# Patient Record
Sex: Female | Born: 1965 | Race: White | Hispanic: No | Smoking: Current every day smoker
Health system: Southern US, Community
[De-identification: ages and names within clinical notes are randomized; demographics above are authoritative.]

## PROBLEM LIST (undated history)

## (undated) DIAGNOSIS — J439 Emphysema, unspecified: Secondary | ICD-10-CM

## (undated) DIAGNOSIS — N739 Female pelvic inflammatory disease, unspecified: Secondary | ICD-10-CM

## (undated) DIAGNOSIS — J449 Chronic obstructive pulmonary disease, unspecified: Secondary | ICD-10-CM

## (undated) DIAGNOSIS — K219 Gastro-esophageal reflux disease without esophagitis: Secondary | ICD-10-CM

## (undated) DIAGNOSIS — G709 Myoneural disorder, unspecified: Secondary | ICD-10-CM

## (undated) DIAGNOSIS — A4151 Sepsis due to Escherichia coli [E. coli]: Secondary | ICD-10-CM

## (undated) DIAGNOSIS — C801 Malignant (primary) neoplasm, unspecified: Secondary | ICD-10-CM

## (undated) DIAGNOSIS — IMO0002 Reserved for concepts with insufficient information to code with codable children: Secondary | ICD-10-CM

## (undated) DIAGNOSIS — D649 Anemia, unspecified: Secondary | ICD-10-CM

## (undated) DIAGNOSIS — G35 Multiple sclerosis: Secondary | ICD-10-CM

## (undated) DIAGNOSIS — I10 Essential (primary) hypertension: Secondary | ICD-10-CM

## (undated) DIAGNOSIS — B999 Unspecified infectious disease: Secondary | ICD-10-CM

## (undated) DIAGNOSIS — K5720 Diverticulitis of large intestine with perforation and abscess without bleeding: Secondary | ICD-10-CM

## (undated) DIAGNOSIS — T7840XA Allergy, unspecified, initial encounter: Secondary | ICD-10-CM

## (undated) DIAGNOSIS — T148XXA Other injury of unspecified body region, initial encounter: Secondary | ICD-10-CM

## (undated) DIAGNOSIS — R87619 Unspecified abnormal cytological findings in specimens from cervix uteri: Secondary | ICD-10-CM

## (undated) DIAGNOSIS — R51 Headache: Secondary | ICD-10-CM

## (undated) HISTORY — PX: TUBAL LIGATION: SHX77

## (undated) HISTORY — DX: Female pelvic inflammatory disease, unspecified: N73.9

## (undated) HISTORY — DX: Essential (primary) hypertension: I10

## (undated) HISTORY — DX: Emphysema, unspecified: J43.9

## (undated) HISTORY — PX: ARM WOUND REPAIR / CLOSURE: SUR1141

## (undated) HISTORY — PX: COLPOSCOPY: SHX161

## (undated) HISTORY — PX: OTHER SURGICAL HISTORY: SHX169

## (undated) HISTORY — DX: Malignant (primary) neoplasm, unspecified: C80.1

## (undated) HISTORY — DX: Allergy, unspecified, initial encounter: T78.40XA

## (undated) HISTORY — PX: DILATION AND CURETTAGE OF UTERUS: SHX78

---
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
## 1985-07-18 DIAGNOSIS — C801 Malignant (primary) neoplasm, unspecified: Secondary | ICD-10-CM

## 1985-07-18 HISTORY — DX: Malignant (primary) neoplasm, unspecified: C80.1

## 1999-08-02 ENCOUNTER — Encounter: Payer: Self-pay | Admitting: Emergency Medicine

## 1999-08-02 ENCOUNTER — Emergency Department (HOSPITAL_COMMUNITY): Admission: EM | Admit: 1999-08-02 | Discharge: 1999-08-02 | Payer: Self-pay | Admitting: Emergency Medicine

## 2001-10-10 ENCOUNTER — Emergency Department (HOSPITAL_COMMUNITY): Admission: EM | Admit: 2001-10-10 | Discharge: 2001-10-10 | Payer: Self-pay

## 2001-10-25 ENCOUNTER — Emergency Department (HOSPITAL_COMMUNITY): Admission: EM | Admit: 2001-10-25 | Discharge: 2001-10-25 | Payer: Self-pay | Admitting: Emergency Medicine

## 2002-04-08 ENCOUNTER — Emergency Department (HOSPITAL_COMMUNITY): Admission: EM | Admit: 2002-04-08 | Discharge: 2002-04-09 | Payer: Self-pay

## 2002-04-08 ENCOUNTER — Encounter: Payer: Self-pay | Admitting: Emergency Medicine

## 2003-04-10 ENCOUNTER — Emergency Department (HOSPITAL_COMMUNITY): Admission: EM | Admit: 2003-04-10 | Discharge: 2003-04-10 | Payer: Self-pay | Admitting: Emergency Medicine

## 2003-04-10 ENCOUNTER — Encounter: Payer: Self-pay | Admitting: Emergency Medicine

## 2003-04-11 ENCOUNTER — Ambulatory Visit (HOSPITAL_COMMUNITY): Admission: RE | Admit: 2003-04-11 | Discharge: 2003-04-11 | Payer: Self-pay | Admitting: Emergency Medicine

## 2003-04-11 ENCOUNTER — Encounter: Payer: Self-pay | Admitting: Emergency Medicine

## 2003-10-18 ENCOUNTER — Emergency Department (HOSPITAL_COMMUNITY): Admission: AD | Admit: 2003-10-18 | Discharge: 2003-10-18 | Payer: Self-pay | Admitting: Emergency Medicine

## 2003-10-23 ENCOUNTER — Emergency Department (HOSPITAL_COMMUNITY): Admission: EM | Admit: 2003-10-23 | Discharge: 2003-10-23 | Payer: Self-pay | Admitting: Emergency Medicine

## 2004-05-31 ENCOUNTER — Emergency Department (HOSPITAL_COMMUNITY): Admission: EM | Admit: 2004-05-31 | Discharge: 2004-05-31 | Payer: Self-pay | Admitting: Emergency Medicine

## 2004-06-29 ENCOUNTER — Emergency Department (HOSPITAL_COMMUNITY): Admission: EM | Admit: 2004-06-29 | Discharge: 2004-06-29 | Payer: Self-pay | Admitting: Emergency Medicine

## 2004-09-05 ENCOUNTER — Emergency Department (HOSPITAL_COMMUNITY): Admission: EM | Admit: 2004-09-05 | Discharge: 2004-09-05 | Payer: Self-pay | Admitting: Family Medicine

## 2005-01-05 ENCOUNTER — Emergency Department (HOSPITAL_COMMUNITY): Admission: EM | Admit: 2005-01-05 | Discharge: 2005-01-05 | Payer: Self-pay | Admitting: Emergency Medicine

## 2005-02-02 ENCOUNTER — Emergency Department (HOSPITAL_COMMUNITY): Admission: EM | Admit: 2005-02-02 | Discharge: 2005-02-02 | Payer: Self-pay | Admitting: Emergency Medicine

## 2005-03-01 ENCOUNTER — Emergency Department (HOSPITAL_COMMUNITY): Admission: EM | Admit: 2005-03-01 | Discharge: 2005-03-01 | Payer: Self-pay | Admitting: Emergency Medicine

## 2005-03-04 ENCOUNTER — Emergency Department (HOSPITAL_COMMUNITY): Admission: EM | Admit: 2005-03-04 | Discharge: 2005-03-04 | Payer: Self-pay | Admitting: Family Medicine

## 2005-03-18 ENCOUNTER — Encounter: Admission: RE | Admit: 2005-03-18 | Discharge: 2005-03-18 | Payer: Self-pay | Admitting: Neurology

## 2005-04-13 ENCOUNTER — Encounter: Admission: RE | Admit: 2005-04-13 | Discharge: 2005-04-13 | Payer: Self-pay | Admitting: Neurology

## 2005-10-01 ENCOUNTER — Emergency Department (HOSPITAL_COMMUNITY): Admission: EM | Admit: 2005-10-01 | Discharge: 2005-10-01 | Payer: Self-pay | Admitting: Emergency Medicine

## 2005-10-11 ENCOUNTER — Ambulatory Visit: Payer: Self-pay | Admitting: Gastroenterology

## 2005-12-03 ENCOUNTER — Emergency Department (HOSPITAL_COMMUNITY): Admission: EM | Admit: 2005-12-03 | Discharge: 2005-12-03 | Payer: Self-pay | Admitting: Family Medicine

## 2006-01-09 ENCOUNTER — Emergency Department (HOSPITAL_COMMUNITY): Admission: EM | Admit: 2006-01-09 | Discharge: 2006-01-09 | Payer: Self-pay | Admitting: Emergency Medicine

## 2006-08-22 ENCOUNTER — Emergency Department (HOSPITAL_COMMUNITY): Admission: EM | Admit: 2006-08-22 | Discharge: 2006-08-22 | Payer: Self-pay | Admitting: Family Medicine

## 2006-09-25 ENCOUNTER — Emergency Department (HOSPITAL_COMMUNITY): Admission: EM | Admit: 2006-09-25 | Discharge: 2006-09-25 | Payer: Self-pay | Admitting: Emergency Medicine

## 2007-06-18 ENCOUNTER — Emergency Department (HOSPITAL_COMMUNITY): Admission: EM | Admit: 2007-06-18 | Discharge: 2007-06-18 | Payer: Self-pay | Admitting: Emergency Medicine

## 2007-09-25 ENCOUNTER — Emergency Department (HOSPITAL_COMMUNITY): Admission: EM | Admit: 2007-09-25 | Discharge: 2007-09-25 | Payer: Self-pay | Admitting: Family Medicine

## 2007-12-24 ENCOUNTER — Emergency Department (HOSPITAL_COMMUNITY): Admission: EM | Admit: 2007-12-24 | Discharge: 2007-12-24 | Payer: Self-pay | Admitting: Emergency Medicine

## 2008-02-10 ENCOUNTER — Emergency Department (HOSPITAL_COMMUNITY): Admission: EM | Admit: 2008-02-10 | Discharge: 2008-02-10 | Payer: Self-pay | Admitting: Emergency Medicine

## 2008-03-20 ENCOUNTER — Emergency Department (HOSPITAL_COMMUNITY): Admission: EM | Admit: 2008-03-20 | Discharge: 2008-03-21 | Payer: Self-pay | Admitting: Emergency Medicine

## 2008-04-04 ENCOUNTER — Emergency Department (HOSPITAL_COMMUNITY): Admission: EM | Admit: 2008-04-04 | Discharge: 2008-04-04 | Payer: Self-pay | Admitting: Emergency Medicine

## 2008-06-29 ENCOUNTER — Emergency Department (HOSPITAL_COMMUNITY): Admission: EM | Admit: 2008-06-29 | Discharge: 2008-06-29 | Payer: Self-pay | Admitting: Emergency Medicine

## 2008-10-05 ENCOUNTER — Emergency Department (HOSPITAL_COMMUNITY): Admission: EM | Admit: 2008-10-05 | Discharge: 2008-10-05 | Payer: Self-pay | Admitting: Emergency Medicine

## 2008-11-14 ENCOUNTER — Emergency Department (HOSPITAL_COMMUNITY): Admission: EM | Admit: 2008-11-14 | Discharge: 2008-11-14 | Payer: Self-pay | Admitting: Family Medicine

## 2008-12-15 ENCOUNTER — Emergency Department (HOSPITAL_COMMUNITY): Admission: EM | Admit: 2008-12-15 | Discharge: 2008-12-15 | Payer: Self-pay | Admitting: Family Medicine

## 2009-03-07 ENCOUNTER — Emergency Department (HOSPITAL_COMMUNITY): Admission: EM | Admit: 2009-03-07 | Discharge: 2009-03-07 | Payer: Self-pay | Admitting: Family Medicine

## 2010-03-02 ENCOUNTER — Emergency Department (HOSPITAL_COMMUNITY): Admission: EM | Admit: 2010-03-02 | Discharge: 2010-03-02 | Payer: Self-pay | Admitting: Family Medicine

## 2010-06-09 ENCOUNTER — Emergency Department (HOSPITAL_COMMUNITY)
Admission: EM | Admit: 2010-06-09 | Discharge: 2010-06-09 | Disposition: A | Discharge status: Other Acute Inpatient Hospital | Payer: Self-pay | Source: Home / Self Care | Admitting: Emergency Medicine

## 2010-06-09 ENCOUNTER — Emergency Department (HOSPITAL_COMMUNITY)
Admission: EM | Admit: 2010-06-09 | Discharge: 2010-06-09 | Payer: Self-pay | Source: Home / Self Care | Admitting: Emergency Medicine

## 2010-06-14 ENCOUNTER — Emergency Department (HOSPITAL_COMMUNITY)
Admission: EM | Admit: 2010-06-14 | Discharge: 2010-06-14 | Payer: Self-pay | Source: Home / Self Care | Admitting: Emergency Medicine

## 2010-09-29 LAB — URINE MICROSCOPIC-ADD ON

## 2010-09-29 LAB — URINALYSIS, ROUTINE W REFLEX MICROSCOPIC
Glucose, UA: NEGATIVE mg/dL
Protein, ur: 100 mg/dL — AB
Urobilinogen, UA: 0.2 mg/dL (ref 0.0–1.0)

## 2010-10-01 LAB — POCT URINALYSIS DIPSTICK
Ketones, ur: NEGATIVE mg/dL
Specific Gravity, Urine: >=1.03 (ref 1.005–1.030)
pH: 5.5 (ref 5.0–8.0)

## 2010-10-01 LAB — GC/CHLAMYDIA PROBE AMP, GENITAL: Chlamydia, DNA Probe: NEGATIVE

## 2010-10-01 LAB — POCT I-STAT, CHEM 8
BUN: 13 mg/dL (ref 6–23)
Creatinine, Ser: 0.8 mg/dL (ref 0.4–1.2)
Potassium: 4.3 mEq/L (ref 3.5–5.1)
Sodium: 139 mEq/L (ref 135–145)

## 2010-10-01 LAB — WET PREP, GENITAL
Trich, Wet Prep: NONE SEEN
Yeast Wet Prep HPF POC: NONE SEEN

## 2010-11-25 ENCOUNTER — Inpatient Hospital Stay (HOSPITAL_COMMUNITY)
Admission: AD | Admit: 2010-11-25 | Discharge: 2010-11-25 | Disposition: A | Discharge status: Home / Self Care | Payer: Self-pay | Source: Ambulatory Visit | Attending: Obstetrics and Gynecology | Admitting: Obstetrics and Gynecology

## 2010-11-25 DIAGNOSIS — L02219 Cutaneous abscess of trunk, unspecified: Secondary | ICD-10-CM

## 2010-11-25 DIAGNOSIS — L03319 Cellulitis of trunk, unspecified: Secondary | ICD-10-CM | POA: Insufficient documentation

## 2011-04-14 LAB — GC/CHLAMYDIA PROBE AMP, GENITAL
Chlamydia, DNA Probe: NEGATIVE
GC Probe Amp, Genital: NEGATIVE

## 2011-04-14 LAB — WET PREP, GENITAL: Clue Cells Wet Prep HPF POC: NONE SEEN

## 2011-04-18 LAB — GC/CHLAMYDIA PROBE AMP, GENITAL
Chlamydia, DNA Probe: NEGATIVE
GC Probe Amp, Genital: NEGATIVE

## 2011-04-18 LAB — POCT URINALYSIS DIP (DEVICE)
Glucose, UA: NEGATIVE
Nitrite: NEGATIVE

## 2011-04-18 LAB — WET PREP, GENITAL: Yeast Wet Prep HPF POC: NONE SEEN

## 2011-05-04 ENCOUNTER — Inpatient Hospital Stay (HOSPITAL_COMMUNITY)
Admission: AD | Admit: 2011-05-04 | Discharge: 2011-05-04 | Disposition: A | Discharge status: Home / Self Care | Payer: Self-pay | Source: Ambulatory Visit | Attending: Obstetrics & Gynecology | Admitting: Obstetrics & Gynecology

## 2011-05-04 ENCOUNTER — Encounter (HOSPITAL_COMMUNITY): Payer: Self-pay | Admitting: *Deleted

## 2011-05-04 DIAGNOSIS — N951 Menopausal and female climacteric states: Secondary | ICD-10-CM | POA: Insufficient documentation

## 2011-05-04 HISTORY — DX: Multiple sclerosis: G35

## 2011-05-04 HISTORY — DX: Chronic obstructive pulmonary disease, unspecified: J44.9

## 2011-05-04 LAB — URINALYSIS, ROUTINE W REFLEX MICROSCOPIC
Bilirubin Urine: NEGATIVE
Glucose, UA: NEGATIVE mg/dL
Ketones, ur: NEGATIVE mg/dL
Nitrite: NEGATIVE
pH: 7 (ref 5.0–8.0)

## 2011-05-04 LAB — URINE MICROSCOPIC-ADD ON

## 2011-05-04 NOTE — ED Notes (Signed)
Had pregnancy symptoms for several weeks with weight gain; hx of BTL;  Having discomfort with sexual intercourse;

## 2011-05-04 NOTE — ED Provider Notes (Signed)
History   Pt presents today c/o nausea, pelvic pressure, hot flashes, and urinary frequency that comes and goes. She states for the past year her periods seem to "skip" months. She is concerned that she may be pregnant. She denies fever, vag dc, vag irritation, or any other sx at this time.  Chief Complaint  Patient presents with  . Possible Pregnancy   HPI  OB History    Grav Para Term Preterm Abortions TAB SAB Ect Mult Living   7 2 1 1 5 2 3   2       Past Medical History  Diagnosis Date  . Emphysema   . COPD (chronic obstructive pulmonary disease)   . MS (multiple sclerosis)     Past Surgical History  Procedure Date  . Tubal ligation   . Arm wound repair / closure     No family history on file.  History  Substance Use Topics  . Smoking status: Current Everyday Smoker -- 1.0 packs/day  . Smokeless tobacco: Not on file  . Alcohol Use: No    Allergies: No Known Allergies  No prescriptions prior to admission    Review of Systems  Constitutional: Negative for fever.  Cardiovascular: Negative for chest pain.  Gastrointestinal: Positive for abdominal pain. Negative for nausea, vomiting, diarrhea and constipation.  Genitourinary: Negative for dysuria, urgency, frequency and hematuria.  Neurological: Negative for dizziness and headaches.  Psychiatric/Behavioral: Negative for depression and suicidal ideas.   Physical Exam   Blood pressure 127/96, pulse 84, temperature 98 F (36.7 C), temperature source Oral, resp. rate 18, height 5' 2.5" (1.588 m), weight 139 lb 6.4 oz (63.231 kg), last menstrual period 04/26/2011, SpO2 97.00%.  Physical Exam  Nursing note and vitals reviewed. Constitutional: She is oriented to person, place, and time. She appears well-developed and well-nourished. No distress.  HENT:  Head: Normocephalic and atraumatic.  Eyes: EOM are normal. Pupils are equal, round, and reactive to light.  GI: Soft. She exhibits no distension and no mass. There  is tenderness. There is no rebound and no guarding.  Genitourinary: Uterus normal. Cervix exhibits no motion tenderness. Right adnexum displays no mass, no tenderness and no fullness. Left adnexum displays no mass, no tenderness and no fullness. There is bleeding around the vagina. No vaginal discharge found.  Neurological: She is alert and oriented to person, place, and time.  Skin: Skin is warm and dry. She is not diaphoretic.  Psychiatric: She has a normal mood and affect. Her behavior is normal. Judgment and thought content normal.    MAU Course  Procedures  Results for orders placed during the hospital encounter of 05/04/11 (from the past 24 hour(s))  URINALYSIS, ROUTINE W REFLEX MICROSCOPIC     Status: Abnormal   Collection Time   05/04/11 11:25 AM      Component Value Range   Color, Urine YELLOW  YELLOW    Appearance CLEAR  CLEAR    Specific Gravity, Urine 1.020  1.005 - 1.030    pH 7.0  5.0 - 8.0    Glucose, UA NEGATIVE  NEGATIVE (mg/dL)   Hgb urine dipstick MODERATE (*) NEGATIVE    Bilirubin Urine NEGATIVE  NEGATIVE    Ketones, ur NEGATIVE  NEGATIVE (mg/dL)   Protein, ur NEGATIVE  NEGATIVE (mg/dL)   Urobilinogen, UA 2.0 (*) 0.0 - 1.0 (mg/dL)   Nitrite NEGATIVE  NEGATIVE    Leukocytes, UA NEGATIVE  NEGATIVE   URINE MICROSCOPIC-ADD ON     Status: Abnormal  Collection Time   05/04/11 11:25 AM      Component Value Range   Squamous Epithelial / LPF FEW (*) RARE    WBC, UA 0-2  <3 (WBC/hpf)   RBC / HPF 3-6  <3 (RBC/hpf)  POCT PREGNANCY, URINE     Status: Normal   Collection Time   05/04/11 11:30 AM      Component Value Range   Preg Test, Ur NEGATIVE      Urine sent for culture. Assessment and Plan  Perimenopausal sx: discussed with pt at length. Will have her f/u in the GYN clinic. Discussed diet, activity, risks, and precautions.  Alinda Deem. Rice III, DrHSc, MPAS, PA-C  05/04/2011, 12:05 PM   Eliott Nine, PA 05/04/11 1208

## 2011-05-04 NOTE — Progress Notes (Signed)
Pt states she has been having irregular periods for about 8 months. Has been having symptoms of pregnancy, nausea, cramping, urinary frequency. Pressure in lower abdomen and weight gain.

## 2011-05-04 NOTE — ED Notes (Signed)
Family history of early menopause;

## 2011-05-05 LAB — URINE CULTURE: Culture  Setup Time: 201210171759

## 2011-05-05 NOTE — ED Provider Notes (Signed)
Attestation of Attending Supervision of Advanced Practitioner: Evaluation and management procedures were performed by the PA/NP/CNM/OB Fellow under my supervision/collaboration. Chart reviewed and agree with management and plan.  Jafet Wissing A 05/05/2011 2:51 PM

## 2011-06-02 ENCOUNTER — Encounter: Payer: Self-pay | Admitting: Obstetrics and Gynecology

## 2011-06-02 ENCOUNTER — Ambulatory Visit (INDEPENDENT_AMBULATORY_CARE_PROVIDER_SITE_OTHER): Payer: Self-pay | Admitting: Obstetrics and Gynecology

## 2011-06-02 DIAGNOSIS — N951 Menopausal and female climacteric states: Secondary | ICD-10-CM | POA: Insufficient documentation

## 2011-06-02 DIAGNOSIS — Z124 Encounter for screening for malignant neoplasm of cervix: Secondary | ICD-10-CM

## 2011-06-02 DIAGNOSIS — R350 Frequency of micturition: Secondary | ICD-10-CM

## 2011-06-02 DIAGNOSIS — Z720 Tobacco use: Secondary | ICD-10-CM | POA: Insufficient documentation

## 2011-06-02 DIAGNOSIS — Z23 Encounter for immunization: Secondary | ICD-10-CM

## 2011-06-02 DIAGNOSIS — I10 Essential (primary) hypertension: Secondary | ICD-10-CM

## 2011-06-02 DIAGNOSIS — F17200 Nicotine dependence, unspecified, uncomplicated: Secondary | ICD-10-CM

## 2011-06-02 DIAGNOSIS — R3129 Other microscopic hematuria: Secondary | ICD-10-CM

## 2011-06-02 LAB — POCT URINALYSIS DIP (DEVICE)
Bilirubin Urine: NEGATIVE
Ketones, ur: NEGATIVE mg/dL
Leukocytes, UA: NEGATIVE
Nitrite: NEGATIVE
Protein, ur: NEGATIVE mg/dL

## 2011-06-02 LAB — GLUCOSE, CAPILLARY: Glucose-Capillary: 103 mg/dL — ABNORMAL HIGH (ref 70–99)

## 2011-06-02 MED ORDER — INFLUENZA VIRUS VACC SPLIT PF IM SUSP
0.5000 mL | INTRAMUSCULAR | Status: AC
  Administered 2011-06-02: 0.5 mL via INTRAMUSCULAR

## 2011-06-02 NOTE — Progress Notes (Signed)
Addended by: Armond Hang on: 06/02/2011 03:42 PM   Modules accepted: Orders

## 2011-06-02 NOTE — Progress Notes (Signed)
Subjective:    Patient ID: Christina Miller, female    DOB: 08-16-1965, 45 y.o.   MRN: [id number]  HPI She presents for a followup visit after being seen in paternity admissions unit on 05/04/2011. She was primarily concerned with irregular menses and hot flashes at that time. She is post tubal ligation. She is reporting that her menses have been irregular for 2 years describing bleeding every 2-3 weeks for a year, and then for about the past 9 months periods have been every 2-3 months and occasionally heavy. She does have occasional hot flashes but tolerates that symptom is concerned that she has a family history of diabetes and Endo Shears always been very thirsty and having frequency of urination. She does not diet or exercise and smokes about a pack a day her last Pap she believes was a couple of years ago and she does not remember when she had one before that or if she's had any abnormal menses. She has not had a mammogram and would like to be scheduled for one.   Review of Systems  Constitutional: Positive for diaphoresis and fatigue. Negative for activity change and appetite change.  HENT: Negative for congestion.   Respiratory: Positive for cough. Negative for chest tightness, shortness of breath and wheezing.        Chronic dry cough  Cardiovascular: Negative for chest pain, palpitations and leg swelling.  Gastrointestinal: Positive for abdominal distention.  Genitourinary: Positive for frequency and menstrual problem. Negative for dysuria, urgency, hematuria, vaginal bleeding, vaginal discharge, difficulty urinating, vaginal pain, pelvic pain and dyspareunia.       SUI  Musculoskeletal: Negative for back pain, joint swelling and arthralgias.  Neurological: Positive for weakness and headaches. Negative for dizziness.  Psychiatric/Behavioral: Negative for agitation.   Past Medical History  Diagnosis Date  . Emphysema   . COPD (chronic obstructive pulmonary disease)   . MS (multiple  sclerosis)   . Hypertension    Past Surgical History  Procedure Date  . Tubal ligation   . Arm wound repair / closure    History   Social History  . Marital Status: Legally Separated    Spouse Name: N/A    Number of Children: N/A  . Years of Education: N/A   Occupational History  . Not on file.   Social History Main Topics  . Smoking status: Current Everyday Smoker -- 1.0 packs/day  . Smokeless tobacco: Not on file  . Alcohol Use: No  . Drug Use: No  . Sexually Active:    Other Topics Concern  . Not on file   Social History Narrative  . No narrative on file    No current outpatient prescriptions on file prior to visit.  No Known Allergies       Objective:   Physical Exam  Constitutional: She appears well-developed and well-nourished.  Neck: Neck supple.  Cardiovascular: Normal rate.   Abdominal: Soft. She exhibits no distension and no mass. There is tenderness. There is no rebound and no guarding.  Genitourinary: Uterus normal. No vaginal discharge found.       NEFG; vag pale pink, dec. Ruggae, scant white d/c'cx clean, parous os 2+rectocele and cystocele, fair tone Cx: mobile UT: NSSP Adn: NT, no masses Pap done   CBG 103      Assessment & Plan:  Perimenopausal bleeding: FSH, LH, TSH. Pap Cystocele/rectocele: Kegels Microscpic hematuria: C&S CHTN COPD/current smoker: advised [email or web address] and gave other resources, though not motivated at present MS Refer to  [facility] or [facility] for medical problems HCM: Flu vaccine, strt multivit Ca++ 1200 mg and vit D

## 2011-06-02 NOTE — Patient Instructions (Signed)
Perimenopause Perimenopause is the time when your body begins to move into the menopause (no menstrual period for 12 straight months). It is a natural process. Perimenopause can begin 2 to 8 years before the menopause and usually lasts for one year after the menopause. During this time, your ovaries may or may not produce an egg. The ovaries vary in their production of estrogen and progesterone hormones each month. This can cause irregular menstrual periods, difficulty in getting pregnant, vaginal bleeding between periods and uncomfortable symptoms. CAUSES  Irregular production of the ovarian hormones, estrogen and progesterone, and not ovulating every month.   Other causes include:   Tumor of the pituitary gland in the brain.   Medical disease that affects the ovaries.   Radiation treatment.   Chemotherapy.   Unknown causes.   Heavy smoking and excessive alcohol intake can bring on perimenopause sooner.  SYMPTOMS   Hot flashes.   Night sweats.   Irregular menstrual periods.   Decrease sex drive.   Vaginal dryness.   Headaches.   Mood swings.   Depression.   Memory problems.   Irritability.   Tiredness.   Weight gain.   Trouble getting pregnant.   The beginning of losing bone cells (osteoporosis).   The beginning of hardening of the arteries (atherosclerosis).  DIAGNOSIS  Your caregiver will make a diagnosis by analyzing your age, menstrual history and your symptoms. They will do a physical exam noting any changes in your body, especially your female organs. Female hormone tests may or may not be helpful depending on the amount and when you produce the female hormones. However, other hormone tests may be helpful (ex. thyroid hormone) to rule out other problems. TREATMENT  The decision to treat during the perimenopause should be made by you and your caregiver depending on how the symptoms are affecting you and your life style. There are various treatments available  such as:  Treating individual symptoms with a specific medication for that symptom (ex. tranquilizer for depression).   Herbal medications that can help specific symptoms.   Counseling.   Group therapy.   No treatment.  HOME CARE INSTRUCTIONS   Before seeing your caregiver, make a list of your menstrual periods (when the occur, how heavy they are, how long between periods and how long they last), your symptoms and when they started.   Take the medication as recommended by your caregiver.   Sleep and rest.   Exercise.   Eat a diet that contains calcium (good for your bones) and soy (acts like estrogen hormone).   Do not smoke.   Avoid alcoholic beverages.   Taking vitamin E may help in certain cases.   Take calcium and vitamin D supplements to help prevent bone loss.   Group therapy is sometimes helpful.   Acupuncture may help in some cases.  SEEK MEDICAL CARE IF:   You have any of the above and want to know if it is perimenopause.   You want advice and treatment for any of your symptoms mentioned above.   You need a referral to a specialist (gynecologist, psychiatrist or psychologist).  SEEK IMMEDIATE MEDICAL CARE IF:   You have vaginal bleeding.   Your period lasts longer than 8 days.   You periods are recurring sooner than 21 days.   You have bleeding after intercourse.   You have severe depression.   You have pain when you urinate.   You have severe headaches.   You develop vision problems.  Document  Released: 08/11/2004 Document Revised: 03/16/2011 Document Reviewed: 05/01/2008 [facility] Patient Information 2012 [place], [place].

## 2011-06-02 NOTE — Progress Notes (Signed)
Addended by: Armond Hang on: 06/02/2011 03:56 PM   Modules accepted: Orders

## 2011-06-03 LAB — COMPREHENSIVE METABOLIC PANEL
AST: 19 U/L (ref 0–37)
Albumin: 4.5 g/dL (ref 3.5–5.2)
Alkaline Phosphatase: 51 U/L (ref 39–117)
BUN: 12 mg/dL (ref 6–23)
Calcium: 9.6 mg/dL (ref 8.4–10.5)
Chloride: 104 mEq/L (ref 96–112)
Potassium: 4.5 mEq/L (ref 3.5–5.3)
Sodium: 138 mEq/L (ref 135–145)
Total Protein: 7.2 g/dL (ref 6.0–8.3)

## 2011-06-03 LAB — URINALYSIS, ROUTINE W REFLEX MICROSCOPIC
Glucose, UA: NEGATIVE mg/dL
Ketones, ur: NEGATIVE mg/dL
Specific Gravity, Urine: 1.023 (ref 1.005–1.030)
pH: 5.5 (ref 5.0–8.0)

## 2011-06-03 LAB — URINALYSIS, MICROSCOPIC ONLY
Bacteria, UA: NONE SEEN
Casts: NONE SEEN

## 2011-06-03 LAB — LUTEINIZING HORMONE: LH: 48.9 m[IU]/mL

## 2011-06-03 LAB — CBC
HCT: 43.8 % (ref 36.0–46.0)
Hemoglobin: 14.7 g/dL (ref 12.0–15.0)
MCHC: 33.6 g/dL (ref 30.0–36.0)
RDW: 13.1 % (ref 11.5–15.5)
WBC: 11 10*3/uL — ABNORMAL HIGH (ref 4.0–10.5)

## 2011-06-03 LAB — TSH: TSH: 1.614 u[IU]/mL (ref 0.350–4.500)

## 2011-06-08 ENCOUNTER — Telehealth: Payer: Self-pay

## 2011-06-08 NOTE — Telephone Encounter (Addendum)
06/08/11@1135am   Pt called wanting to know her lab/pap results.   06/08/11@1407   Called pt and informed pt that we results and pap came back normal.  And that she had an appt scheduled for 06/30/11 for results, I informed her that she could keep that appt if wanted to discuss further management.  Pt stated understanding and had no further questions.

## 2011-06-13 ENCOUNTER — Other Ambulatory Visit: Payer: Self-pay | Admitting: Obstetrics and Gynecology

## 2011-06-13 DIAGNOSIS — N6452 Nipple discharge: Secondary | ICD-10-CM

## 2011-06-30 ENCOUNTER — Ambulatory Visit: Payer: Self-pay | Admitting: Family Medicine

## 2012-04-23 ENCOUNTER — Encounter (HOSPITAL_COMMUNITY): Payer: Self-pay | Admitting: *Deleted

## 2012-04-23 ENCOUNTER — Inpatient Hospital Stay (HOSPITAL_COMMUNITY)
Admission: AD | Admit: 2012-04-23 | Discharge: 2012-04-23 | Disposition: A | Discharge status: Home / Self Care | Payer: Self-pay | Source: Ambulatory Visit | Attending: Obstetrics & Gynecology | Admitting: Obstetrics & Gynecology

## 2012-04-23 ENCOUNTER — Inpatient Hospital Stay (HOSPITAL_COMMUNITY): Payer: Self-pay

## 2012-04-23 DIAGNOSIS — N926 Irregular menstruation, unspecified: Secondary | ICD-10-CM | POA: Insufficient documentation

## 2012-04-23 DIAGNOSIS — N838 Other noninflammatory disorders of ovary, fallopian tube and broad ligament: Secondary | ICD-10-CM | POA: Insufficient documentation

## 2012-04-23 DIAGNOSIS — N951 Menopausal and female climacteric states: Secondary | ICD-10-CM

## 2012-04-23 DIAGNOSIS — N939 Abnormal uterine and vaginal bleeding, unspecified: Secondary | ICD-10-CM

## 2012-04-23 HISTORY — DX: Reserved for concepts with insufficient information to code with codable children: IMO0002

## 2012-04-23 HISTORY — DX: Headache: R51

## 2012-04-23 HISTORY — DX: Anemia, unspecified: D64.9

## 2012-04-23 HISTORY — DX: Unspecified abnormal cytological findings in specimens from cervix uteri: R87.619

## 2012-04-23 HISTORY — DX: Other injury of unspecified body region, initial encounter: T14.8XXA

## 2012-04-23 HISTORY — DX: Unspecified infectious disease: B99.9

## 2012-04-23 LAB — WET PREP, GENITAL
Clue Cells Wet Prep HPF POC: NONE SEEN
Trich, Wet Prep: NONE SEEN

## 2012-04-23 LAB — CBC
HCT: 40 % (ref 36.0–46.0)
Hemoglobin: 13.1 g/dL (ref 12.0–15.0)
MCV: 99.3 fL (ref 78.0–100.0)
RBC: 4.03 MIL/uL (ref 3.87–5.11)
WBC: 12.7 10*3/uL — ABNORMAL HIGH (ref 4.0–10.5)

## 2012-04-23 NOTE — MAU Provider Note (Signed)
History     CSN: [id number]  Arrival date and time: 04/23/12 1714   None     Chief Complaint  Patient presents with  . Vaginal Bleeding   HPI STEPHENY CANAL is 46 y.o. [id number] Unknown weeks presenting with report of LMP 3/13 until this past week she had bleeding with clots.  Described as very large clots with heavier than normal bleeding.  Abdominal discomfort.  Was seen here last Oct with irregular period.  Was seen in [place] as followup in Nov.  [facility] at that time was 54.  She has several medical diagnosis and was encouraged to be see at [facility] or Internal medicine clinic but did not follow up with that plan.      Past Medical History  Diagnosis Date  . Emphysema   . COPD (chronic obstructive pulmonary disease)   . MS (multiple sclerosis)   . Hypertension   . Headache   . Anemia   . Infection     urinary tract infection  . Bone fracture     ankle  . Abnormal Pap smear     cryo    Past Surgical History  Procedure Date  . Tubal ligation   . Arm wound repair / closure   . Dilation and curettage of uterus     Family History  Problem Relation Age of Onset  . Other Neg Hx     History  Substance Use Topics  . Smoking status: Current Every Day Smoker -- 1.0 packs/day for 35 years    Types: Cigarettes  . Smokeless tobacco: Not on file  . Alcohol Use: Yes     couple times a wk    Allergies: No Known Allergies  No prescriptions prior to admission    Review of Systems  Constitutional: Negative.        + for hot flashes  Respiratory: Negative.   Cardiovascular:       Blood pressures are elevated  Gastrointestinal: Positive for abdominal pain (cramping).  Genitourinary:       Heavy vaginal bleeding with clots  Psychiatric/Behavioral:       Mood changes associated with irregular cycles   Physical Exam   Blood pressure 147/103, pulse 92, temperature 97.3 F (36.3 C), temperature source Oral, resp. rate 20, last menstrual period  04/19/2012.  Physical Exam  Constitutional: She is oriented to person, place, and time. She appears well-developed and well-nourished. No distress.  HENT:  Head: Normocephalic.  Neck: Normal range of motion.  Cardiovascular: Normal rate.   Respiratory: Effort normal.  GI: Soft. She exhibits no mass. There is no tenderness. There is no rebound and no guarding.  Genitourinary: Uterus is not enlarged and not tender. Right adnexum displays no mass, no tenderness and no fullness. Left adnexum displays no mass, no tenderness and no fullness. There is bleeding (no active bleeding, 1 medium size clot in vaginal) around the vagina.  Neurological: She is alert and oriented to person, place, and time.  Skin: Skin is warm and dry.  Psychiatric: She has a normal mood and affect. Her behavior is normal.   Clinical Data: Heavy uterine bleeding. Last menstrual period in  March 2013.  TRANSABDOMINAL AND TRANSVAGINAL ULTRASOUND OF PELVIS  Technique: Both transabdominal and transvaginal ultrasound  examinations of the pelvis were performed. Transabdominal  technique was performed for global imaging of the pelvis including  uterus, ovaries, adnexal regions, and pelvic cul-de-sac.  It was necessary to proceed with endovaginal exam following the  transabdominal  exam to visualize the uterus and ovaries.  Comparison: None.  Findings:  Uterus: Normal in size and appearance  Endometrium: Normal in thickness and appearance  Right ovary: Normal in appearance within limits of visualization.  Suboptimal characterization due to overlying bowel artifact.  Left ovary: Slight enlargement with a 2.2 x 2.0 x 1.9 cm complex  area containing several follicles. Sonographic follow-up 6 weeks  recommended to ensure resolution. Considerations would include  complex follicular cyst, endometrioma, or neoplasm. This also could  represent a complex corpus luteum cyst as the patient gave history  of left-sided pain 2 weeks  earlier.  Other Findings: No free fluid  IMPRESSION:  Slight enlargement left ovary 4.2 x 2.2 x 2.4 cm with a 2.2 x 2.0 x  1.9 cm complex area. See discussion above. No free fluid or  adnexal mass.  Original Report Authenticated By: Staci Righter, M.D.   \\ Results for orders placed during the hospital encounter of 04/23/12 (from the past 24 hour(s))  CBC     Status: Abnormal   Collection Time   04/23/12  5:21 PM      Component Value Range   WBC 12.7 (*) 4.0 - 10.5 K/uL   RBC 4.03  3.87 - 5.11 MIL/uL   Hemoglobin 13.1  12.0 - 15.0 g/dL   HCT 40.0  36.0 - 46.0 %   MCV 99.3  78.0 - 100.0 fL   MCH 32.5  26.0 - 34.0 pg   MCHC 32.8  30.0 - 36.0 g/dL   RDW 14.0  11.5 - 15.5 %   Platelets 239  150 - 400 K/uL  WET PREP, GENITAL     Status: Abnormal   Collection Time   04/23/12  6:12 PM      Component Value Range   Yeast Wet Prep HPF POC NONE SEEN  NONE SEEN   Trich, Wet Prep NONE SEEN  NONE SEEN   Clue Cells Wet Prep HPF POC NONE SEEN  NONE SEEN   WBC, Wet Prep HPF POC FEW (*) NONE SEEN  POCT PREGNANCY, URINE     Status: Normal   Collection Time   04/23/12  6:48 PM      Component Value Range   Preg Test, Ur NEGATIVE  NEGATIVE    MAU Course  Procedures  MDM GC/CHL culture to lab Discussed with the patient the lab and ultrasound findings.  Reviewed followup care/plan discussed with the patient.   Assessment and Plan  A:  Perimenopausal Menopausal symptoms--irregular menstrual cycle,hot flashes      Left ovarian enlargement  P:  Referred to [place] for further evaluation of perimenopausal sxs and ultrasound findings.        Laaibah Wartman,EVE M 04/23/2012, 6:01 PM

## 2012-04-23 NOTE — Discharge Instructions (Signed)
Abnormal Uterine Bleeding Abnormal uterine bleeding can have many causes. Some cases are simply treated, while others are more serious. There are several kinds of bleeding that is considered abnormal, including:  Bleeding between periods.  Bleeding after sexual intercourse.  Spotting anytime in the menstrual cycle.  Bleeding heavier or more than normal.  Bleeding after menopause. CAUSES  There are many causes of abnormal uterine bleeding. It can be present in teenagers, pregnant women, women during their reproductive years, and women who have reached menopause. Your caregiver will look for the more common causes depending on your age, signs, symptoms and your particular circumstance. Most cases are not serious and can be treated. Even the more serious causes, like cancer of the female organs, can be treated adequately if found in the early stages. That is why all types of bleeding should be evaluated and treated as soon as possible. DIAGNOSIS  Diagnosing the cause may take several kinds of tests. Your caregiver may:  Take a complete history of the type of bleeding.  Perform a complete physical exam and Pap smear.  Take an ultrasound on the abdomen showing a picture of the female organs and the pelvis.  Inject dye into the uterus and Fallopian tubes and X-ray them (hysterosalpingogram).  Place fluid in the uterus and do an ultrasound (sonohysterogrqphy).  Take a CT scan to examine the female organs and pelvis.  Take an MRI to examine the female organs and pelvis. There is no X-ray involved with this procedure.  Look inside the uterus with a telescope that has a light at the end (hysteroscopy).  Scrap the inside of the uterus to get tissue to examine (Dilatation and Curettage, D&C).  Look into the pelvis with a telescope that has a light at the end (laparoscopy). This is done through a very small cut (incision) in the abdomen. TREATMENT  Treatment will depend on the cause of the  abnormal bleeding. It can include:  Doing nothing to allow the problem to take care of itself over time.  Hormone treatment.  Birth control pills.  Treating the medical condition causing the problem.  Laparoscopy.  Major or minor surgery  Destroying the lining of the uterus with electrical currant, laser, freezing or heat (uterine ablation). HOME CARE INSTRUCTIONS   Follow your caregiver's recommendation on how to treat your problem.  See your caregiver if you missed a menstrual period and think you may be pregnant.  If you are bleeding heavily, count the number of pads/tampons you use and how often you have to change them. Tell this to your caregiver.  Avoid sexual intercourse until the problem is controlled. SEEK MEDICAL CARE IF:   You have any kind of abnormal bleeding mentioned above.  You feel dizzy at times.  You are 46 years old and have not had a menstrual period yet. SEEK IMMEDIATE MEDICAL CARE IF:   You pass out.  You are changing pads/tampons every 15 to 30 minutes.  You have belly (abdominal) pain.  You have a temperature of 100 F (37.8 C) or higher.  You become sweaty or weak.  You are passing large blood clots from the vagina.  You start to feel sick to your stomach (nauseous) and throw up (vomit). Document Released: 07/04/2005 Document Revised: 09/26/2011 Document Reviewed: 11/27/2008 [facility] Patient Information 2013 [place]. Perimenopause Perimenopause is the time when your body begins to move into the menopause (no menstrual period for 12 straight months). It is a natural process. Perimenopause can begin 2 to  8 years before the menopause and usually lasts for one year after the menopause. During this time, your ovaries may or may not produce an egg. The ovaries vary in their production of estrogen and progesterone hormones each month. This can cause irregular menstrual periods, difficulty in getting pregnant, vaginal bleeding between  periods and uncomfortable symptoms. CAUSES  Irregular production of the ovarian hormones, estrogen and progesterone, and not ovulating every month.  Other causes include:  Tumor of the pituitary gland in the brain.  Medical disease that affects the ovaries.  Radiation treatment.  Chemotherapy.  Unknown causes.  Heavy smoking and excessive alcohol intake can bring on perimenopause sooner. SYMPTOMS   Hot flashes.  Night sweats.  Irregular menstrual periods.  Decrease sex drive.  Vaginal dryness.  Headaches.  Mood swings.  Depression.  Memory problems.  Irritability.  Tiredness.  Weight gain.  Trouble getting pregnant.  The beginning of losing bone cells (osteoporosis).  The beginning of hardening of the arteries (atherosclerosis). DIAGNOSIS  Your caregiver will make a diagnosis by analyzing your age, menstrual history and your symptoms. They will do a physical exam noting any changes in your body, especially your female organs. Female hormone tests may or may not be helpful depending on the amount and when you produce the female hormones. However, other hormone tests may be helpful (ex. thyroid hormone) to rule out other problems. TREATMENT  The decision to treat during the perimenopause should be made by you and your caregiver depending on how the symptoms are affecting you and your life style. There are various treatments available such as:  Treating individual symptoms with a specific medication for that symptom (ex. tranquilizer for depression).  Herbal medications that can help specific symptoms.  Counseling.  Group therapy.  No treatment. HOME CARE INSTRUCTIONS   Before seeing your caregiver, make a list of your menstrual periods (when the occur, how heavy they are, how long between periods and how long they last), your symptoms and when they started.  Take the medication as recommended by your caregiver.  Sleep and rest.  Exercise.  Eat a  diet that contains calcium (good for your bones) and soy (acts like estrogen hormone).  Do not smoke.  Avoid alcoholic beverages.  Taking vitamin E may help in certain cases.  Take calcium and vitamin D supplements to help prevent bone loss.  Group therapy is sometimes helpful.  Acupuncture may help in some cases. SEEK MEDICAL CARE IF:   You have any of the above and want to know if it is perimenopause.  You want advice and treatment for any of your symptoms mentioned above.  You need a referral to a specialist (gynecologist, psychiatrist or psychologist). SEEK IMMEDIATE MEDICAL CARE IF:   You have vaginal bleeding.  Your period lasts longer than 8 days.  You periods are recurring sooner than 21 days.  You have bleeding after intercourse.  You have severe depression.  You have pain when you urinate.  You have severe headaches.  You develop vision problems. Document Released: 08/11/2004 Document Revised: 09/26/2011 Document Reviewed: 05/01/2008 [facility] Patient Information 2013 [place].

## 2012-04-23 NOTE — MAU Note (Signed)
Had been having dull constant pain in lower abd past 3 wks.

## 2012-04-23 NOTE — MAU Note (Signed)
Heavy bleeding and clotting started on Thurs.  Was changing hourly. Not as heavy today.  Last period was in March.  Never had bleeding like this before.  Has been having dizziness past 3 months.

## 2012-04-24 LAB — GC/CHLAMYDIA PROBE AMP, GENITAL
Chlamydia, DNA Probe: NEGATIVE
GC Probe Amp, Genital: NEGATIVE

## 2012-04-24 NOTE — MAU Provider Note (Signed)
Medical Screening exam and patient care preformed by advanced practice provider.  Agree with the above management.

## 2012-04-29 ENCOUNTER — Encounter (HOSPITAL_COMMUNITY): Payer: Self-pay | Admitting: *Deleted

## 2012-04-29 ENCOUNTER — Inpatient Hospital Stay (HOSPITAL_COMMUNITY)
Admission: AD | Admit: 2012-04-29 | Discharge: 2012-04-29 | Disposition: A | Discharge status: Home / Self Care | Payer: Self-pay | Source: Ambulatory Visit | Attending: Obstetrics and Gynecology | Admitting: Obstetrics and Gynecology

## 2012-04-29 DIAGNOSIS — R109 Unspecified abdominal pain: Secondary | ICD-10-CM | POA: Insufficient documentation

## 2012-04-29 DIAGNOSIS — N938 Other specified abnormal uterine and vaginal bleeding: Secondary | ICD-10-CM | POA: Insufficient documentation

## 2012-04-29 DIAGNOSIS — N949 Unspecified condition associated with female genital organs and menstrual cycle: Secondary | ICD-10-CM

## 2012-04-29 LAB — COMPREHENSIVE METABOLIC PANEL
ALT: 8 U/L (ref 0–35)
AST: 22 U/L (ref 0–37)
Albumin: 3.7 g/dL (ref 3.5–5.2)
Calcium: 8.7 mg/dL (ref 8.4–10.5)
Sodium: 136 mEq/L (ref 135–145)
Total Protein: 6.7 g/dL (ref 6.0–8.3)

## 2012-04-29 LAB — CBC
MCH: 32.9 pg (ref 26.0–34.0)
MCHC: 33.6 g/dL (ref 30.0–36.0)
Platelets: 255 10*3/uL (ref 150–400)

## 2012-04-29 LAB — URINALYSIS, ROUTINE W REFLEX MICROSCOPIC
Bilirubin Urine: NEGATIVE
Nitrite: POSITIVE — AB
Specific Gravity, Urine: >1.03 — ABNORMAL HIGH (ref 1.005–1.030)
pH: 5.5 (ref 5.0–8.0)

## 2012-04-29 LAB — URINE MICROSCOPIC-ADD ON

## 2012-04-29 MED ORDER — MEGESTROL ACETATE 40 MG PO TABS: ORAL_TABLET | ORAL | Status: DC

## 2012-04-29 MED ORDER — SODIUM CHLORIDE 0.9 % IV BOLUS (SEPSIS)
1000.0000 mL | Freq: Once | INTRAVENOUS | Status: AC
  Administered 2012-04-29: 1000 mL via INTRAVENOUS

## 2012-04-29 MED ORDER — MEGESTROL ACETATE 40 MG PO TABS
40.0000 mg | ORAL_TABLET | Freq: Once | ORAL | Status: AC
  Administered 2012-04-29: 40 mg via ORAL
  Filled 2012-04-29: qty 1

## 2012-04-29 NOTE — MAU Note (Signed)
Patient states she was seen in MAU on 10-7 for bleeding. Has an appointment tomorrow but has continued to bleed heavily and have abdominal cramping. Now beginning to feel weak, dizzy, shortness of breath and chest tightening.

## 2012-04-29 NOTE — MAU Provider Note (Signed)
Attestation of Attending Supervision of Advanced Practitioner: Evaluation and management procedures were performed by the PA/NP/CNM/OB Fellow under my supervision/collaboration. Chart reviewed and agree with management and plan.  Bayne Fosnaugh V 04/29/2012 9:12 PM

## 2012-04-29 NOTE — MAU Provider Note (Signed)
History     CSN: [id number]  Arrival date and time: 04/29/12 1513   First Provider Initiated Contact with Patient 04/29/12 1653      Chief Complaint  Patient presents with  . Vaginal Bleeding  . Chest Pain  . Abdominal Cramping   HPI 46 y.o. [id number] with vaginal bleeding. Seen in MAU for DUB on 10/7, has f/u scheduled in clinic tomorrow. Pt reports that for the last week she has been feeling dizzy, lightheaded, weak, short of breath. Bleeding ongoing.    Past Medical History  Diagnosis Date  . Emphysema   . COPD (chronic obstructive pulmonary disease)   . MS (multiple sclerosis)   . Hypertension   . Headache   . Anemia   . Infection     urinary tract infection  . Bone fracture     ankle  . Abnormal Pap smear     cryo    Past Surgical History  Procedure Date  . Tubal ligation   . Arm wound repair / closure   . Dilation and curettage of uterus     Family History  Problem Relation Age of Onset  . Other Neg Hx     History  Substance Use Topics  . Smoking status: Current Every Day Smoker -- 1.0 packs/day for 35 years    Types: Cigarettes  . Smokeless tobacco: Not on file  . Alcohol Use: Yes     couple times a wk    Allergies: No Known Allergies  Prescriptions prior to admission  Medication Sig Dispense Refill  . ibuprofen (ADVIL,MOTRIN) 200 MG tablet Take 200 mg by mouth every 6 (six) hours as needed. For pain        Review of Systems  Constitutional: Positive for malaise/fatigue.  Respiratory: Positive for shortness of breath.   Cardiovascular: Negative.   Gastrointestinal: Negative for nausea, vomiting, abdominal pain, diarrhea and constipation.  Genitourinary: Negative for dysuria, urgency, frequency, hematuria and flank pain.       Negative for vaginal bleeding, vaginal discharge, dyspareunia  Musculoskeletal: Negative.   Neurological: Positive for weakness.  Psychiatric/Behavioral: Negative.    Physical Exam   Blood pressure 151/102, pulse  92, temperature 98.1 F (36.7 C), temperature source Oral, resp. rate 20, height 5' 2"  (1.575 m), weight 128 lb (58.06 kg), last menstrual period 04/14/2012.  Physical Exam  Nursing note and vitals reviewed. Constitutional: She is oriented to person, place, and time. She appears well-developed and well-nourished. No distress.  Cardiovascular: Normal rate.   Respiratory: Effort normal.  GI: Soft. There is no tenderness.  Genitourinary:       Small bleeding on pad   Musculoskeletal: Normal range of motion.  Neurological: She is alert and oriented to person, place, and time.  Skin: Skin is warm and dry.  Psychiatric: She has a normal mood and affect.    MAU Course  Procedures  Previous Hgb 10/7 was 13.1  Results for orders placed during the hospital encounter of 04/29/12 (from the past 24 hour(s))  CBC     Status: Abnormal   Collection Time   04/29/12  3:45 PM      Component Value Range   WBC 11.0 (*) 4.0 - 10.5 K/uL   RBC 3.43 (*) 3.87 - 5.11 MIL/uL   Hemoglobin 11.3 (*) 12.0 - 15.0 g/dL   HCT 33.6 (*) 36.0 - 46.0 %   MCV 98.0  78.0 - 100.0 fL   MCH 32.9  26.0 - 34.0 pg   MCHC 33.6  30.0 - 36.0 g/dL   RDW 13.8  11.5 - 15.5 %   Platelets 255  150 - 400 K/uL  COMPREHENSIVE METABOLIC PANEL     Status: Normal   Collection Time   04/29/12  3:52 PM      Component Value Range   Sodium 136  135 - 145 mEq/L   Potassium 3.5  3.5 - 5.1 mEq/L   Chloride 98  96 - 112 mEq/L   CO2 26  19 - 32 mEq/L   Glucose, Bld 92  70 - 99 mg/dL   BUN 13  6 - 23 mg/dL   Creatinine, Ser 0.76  0.50 - 1.10 mg/dL   Calcium 8.7  8.4 - 10.5 mg/dL   Total Protein 6.7  6.0 - 8.3 g/dL   Albumin 3.7  3.5 - 5.2 g/dL   AST 22  0 - 37 U/L   ALT 8  0 - 35 U/L   Alkaline Phosphatase 50  39 - 117 U/L   Total Bilirubin 0.3  0.3 - 1.2 mg/dL   GFR calc non Af Amer >90  >90 mL/min   GFR calc Af Amer >90  >90 mL/min      . megestrol  40 mg Oral Once  . sodium chloride  1,000 mL Intravenous Once      Assessment and Plan   1. DUB (dysfunctional uterine bleeding)       Medication List     As of 04/29/2012  6:49 PM    START taking these medications         megestrol 40 MG tablet   Commonly known as: MEGACE   1 tab po tid x 3 days, then 1 tab po bid x 3 days, then 1 tab po daily until follow up or instructed to discontinue by provider      CONTINUE taking these medications         ibuprofen 200 MG tablet   Commonly known as: ADVIL,MOTRIN          Where to get your medications    These are the prescriptions that you need to pick up. We sent them to a specific pharmacy, so you will need to go there to get them.   [facility] - [place] - [address]    [address]  ([place]) [place] [address]    Phone: [phone]        megestrol 40 MG tablet            Follow-up Information    Follow up with [facility]. On 04/30/2012. (as scheduled)    Contact information:   [place] 04/29/2012, 5:54 PM

## 2012-04-30 ENCOUNTER — Ambulatory Visit (INDEPENDENT_AMBULATORY_CARE_PROVIDER_SITE_OTHER): Payer: Self-pay | Admitting: Obstetrics & Gynecology

## 2012-04-30 ENCOUNTER — Encounter: Payer: Self-pay | Admitting: Obstetrics & Gynecology

## 2012-04-30 ENCOUNTER — Other Ambulatory Visit (HOSPITAL_COMMUNITY)
Admission: RE | Admit: 2012-04-30 | Discharge: 2012-04-30 | Disposition: A | Discharge status: Home / Self Care | Payer: Self-pay | Source: Ambulatory Visit | Attending: Obstetrics & Gynecology | Admitting: Obstetrics & Gynecology

## 2012-04-30 VITALS — BP 156/100 | HR 86 | Temp 97.4°F | Ht 64.0 in | Wt 129.0 lb

## 2012-04-30 DIAGNOSIS — N951 Menopausal and female climacteric states: Secondary | ICD-10-CM

## 2012-04-30 DIAGNOSIS — Z23 Encounter for immunization: Secondary | ICD-10-CM

## 2012-04-30 DIAGNOSIS — Z1231 Encounter for screening mammogram for malignant neoplasm of breast: Secondary | ICD-10-CM

## 2012-04-30 DIAGNOSIS — N949 Unspecified condition associated with female genital organs and menstrual cycle: Secondary | ICD-10-CM | POA: Insufficient documentation

## 2012-04-30 DIAGNOSIS — N938 Other specified abnormal uterine and vaginal bleeding: Secondary | ICD-10-CM | POA: Insufficient documentation

## 2012-04-30 LAB — POCT PREGNANCY, URINE: Preg Test, Ur: NEGATIVE

## 2012-04-30 MED ORDER — INFLUENZA VIRUS VACC SPLIT PF IM SUSP
0.5000 mL | Freq: Once | INTRAMUSCULAR | Status: AC
  Administered 2012-04-30: 0.5 mL via INTRAMUSCULAR

## 2012-04-30 NOTE — Progress Notes (Signed)
  Subjective:    Patient ID: Christina Miller, female    DOB: [birth year], 46 y.o.   MRN: [id number]  HPI She has had a long h/o DUB. She has been on megace. She was seen in the MAU recently and has a normal u/s.   Review of Systems  She has had a BTL.    Objective:   Physical Exam  Normal exam Cervix prepped with betadine and grasped with a single tooth tenaculum. Pipelle used for 2 passes with a small amount of tissue obtained.      Assessment & Plan:  DUB- await EMBX RTC 2-3 weeks

## 2012-05-10 ENCOUNTER — Telehealth: Payer: Self-pay

## 2012-05-10 NOTE — Telephone Encounter (Signed)
Called pt and informed pt that her biopsy results resulted in no malignancy.  Pt stated understanding.

## 2012-05-10 NOTE — Telephone Encounter (Signed)
Pt called for results of biopsy.

## 2012-05-25 ENCOUNTER — Ambulatory Visit: Payer: Self-pay | Admitting: Obstetrics & Gynecology

## 2012-05-30 ENCOUNTER — Ambulatory Visit (HOSPITAL_COMMUNITY)
Admission: RE | Admit: 2012-05-30 | Discharge: 2012-05-30 | Disposition: A | Discharge status: Home / Self Care | Payer: Self-pay | Source: Ambulatory Visit | Attending: Obstetrics & Gynecology | Admitting: Obstetrics & Gynecology

## 2012-05-30 DIAGNOSIS — Z1231 Encounter for screening mammogram for malignant neoplasm of breast: Secondary | ICD-10-CM

## 2012-06-04 ENCOUNTER — Ambulatory Visit: Payer: Self-pay | Admitting: Obstetrics & Gynecology

## 2012-06-04 ENCOUNTER — Other Ambulatory Visit: Payer: Self-pay | Admitting: Obstetrics & Gynecology

## 2012-06-04 ENCOUNTER — Telehealth: Payer: Self-pay | Admitting: Medical

## 2012-06-04 DIAGNOSIS — R928 Other abnormal and inconclusive findings on diagnostic imaging of breast: Secondary | ICD-10-CM

## 2012-06-04 NOTE — Telephone Encounter (Signed)
Mammogram has not been reviewed by Dr. Hulan Fray yet. I called and informed the patient that we would be in touch after they were reviewed to let her know the results. The patient voiced understanding and did not have any further questions at this time.

## 2012-06-04 NOTE — Telephone Encounter (Signed)
Patient called asking for mammogram results.

## 2012-06-05 NOTE — Telephone Encounter (Signed)
In basket msg sent to Dr. Hulan Fray. Still do not see that results were reviewed by [facility], but patient has diagnostic mammo scheduled. Have asked Dr. Hulan Fray to let me know if patient needs to be informed of results and/or appointments.

## 2012-06-06 NOTE — Telephone Encounter (Signed)
Called pt and left message to return our call to the clinics. Called [facility] and spoke with [place] [facility]ers and [place] informed me that the pt has been spoken to and the pt is the one who picked those appt times scheduled for diagnostics.

## 2012-06-07 ENCOUNTER — Telehealth (HOSPITAL_COMMUNITY): Payer: Self-pay | Admitting: *Deleted

## 2012-06-07 NOTE — Telephone Encounter (Signed)
Telephoned patient at home # and left message to return call to [facility] 

## 2012-06-08 ENCOUNTER — Encounter (HOSPITAL_COMMUNITY): Payer: Self-pay | Admitting: *Deleted

## 2012-06-08 ENCOUNTER — Telehealth: Payer: Self-pay | Admitting: Medical

## 2012-06-08 NOTE — Telephone Encounter (Signed)
Patient called stating that she is returning our call.

## 2012-06-08 NOTE — Telephone Encounter (Signed)
Pt was called yesterday by Rolena Infante LPN from [facility].  E-mail message sent to Gabriel Cirri that pt has returned her call.

## 2012-06-11 ENCOUNTER — Other Ambulatory Visit: Payer: Self-pay

## 2012-06-18 ENCOUNTER — Ambulatory Visit (INDEPENDENT_AMBULATORY_CARE_PROVIDER_SITE_OTHER): Payer: Self-pay | Admitting: Family Medicine

## 2012-06-18 ENCOUNTER — Encounter: Payer: Self-pay | Admitting: Family Medicine

## 2012-06-18 VITALS — BP 154/93 | HR 91 | Temp 97.6°F | Ht 64.0 in | Wt 128.0 lb

## 2012-06-18 DIAGNOSIS — R928 Other abnormal and inconclusive findings on diagnostic imaging of breast: Secondary | ICD-10-CM

## 2012-06-18 DIAGNOSIS — N83202 Unspecified ovarian cyst, left side: Secondary | ICD-10-CM

## 2012-06-18 DIAGNOSIS — N83209 Unspecified ovarian cyst, unspecified side: Secondary | ICD-10-CM

## 2012-06-18 DIAGNOSIS — N949 Unspecified condition associated with female genital organs and menstrual cycle: Secondary | ICD-10-CM

## 2012-06-18 DIAGNOSIS — N938 Other specified abnormal uterine and vaginal bleeding: Secondary | ICD-10-CM

## 2012-06-18 NOTE — Patient Instructions (Signed)
Follow up diagnostic mammogram and breast ultrasound due to abnormal findings (asymmetry) on both breasts on your mammogram.    Breast Cancer, What You Should Know Breast cancer is one of the most common types of cancer in women. It is the second leading cause of death for all women.  The probability that breast cancer will return after treatment is directly related to the stage of its malignancy. This means how advanced the cancer is before it is found and treated. If breast cancer is found and treated early, before the cancer has spread to the lymph nodes, your chance for survival is much better. Delays in diagnosing or treating breast cancer can result in spread of the cancer. This happens when warning signs are ignored and proper measures for diagnosis or treatment are not taken. When breast cancer spreads beyond the breast to other parts of the body, staging is done in order to find out the extent of the cancer and to give the best treatment available. Today, there is a better chance of survival in the treatment of breast cancer. In the past 20 to 30 years, the diagnosis and treatment of breast cancer has greatly improved. There are more options for the type of surgery available (not just radical breast removal [mastectomy] and removal of lymph nodes in the armpit). There are also improved medicines, chemotherapy, radiation therapy, and breast reconstruction. These innovations have improved the survival rate and have helped with complications of breast cancer that were present in the past. Radiation, chemotherapy, and medicines may be used before or after surgery to shrink the tumor, kill any remaining cancer cells, and to prevent spreading and recurrence of the cancer. Research to develop new medicines, procedures, and combinations of treatments is constantly being done to help improve prevention, treatment, and to reduce recurrence of breast cancer. TYPES OF BREAST CANCER  In situ. Cancer is  contained in the tumor and has not spread.  Invasive. Cancer has spread outside the tumor.  Inflammatory. The whole breast is red (inflamed), painful, and swollen (rare).  Paget's disease. Cancer starts in the nipple and spreads to the areola (rare).  Breast cancer in the milk ducts.  Breast cancer in the milk lobules. Males can get breast cancer, but it is very rare. RISK FACTORS There are certain conditions and circumstances that place some women at risk for developing breast cancer. If you have any or several of these risks, you should be aware of them and take extra precautions to take better care of yourself. Some of these risk factors include:  Previous history of breast cancer.  Family history of breast cancer.  Abnormal genes present in your body (BRCA 1, BRCA 2, and HER-2).  Calcium deposits (calcifications) seen on your breast X-ray (mammogram).  Starting your menstrual periods before age 89 (early menarche).  Late menopause, at age 79 or older.  Heavy radiation exposure to the chest.  Cancer of the uterus, ovary, or intestine.  Drinking too much alcohol.  Never having a baby or breastfeeding.  Taking too much hormone treatment for too long.  Being very tall.  Being of Jewish descent.  Obesity.  Presence of estrogen or progesterone receptor cells in the breast.  Smoking. Low-dose birth control pills and fibrocystic disease of the breast are not thought to cause breast cancer. MANAGING BREAST PROBLEMS The following are steps that should be taken to avoid a bad outcome with a breast problem:  You should practice "breast self-awareness." This means understanding the normal appearance  and feel of your breasts and may include breast self-exams. Any changes detected, no matter how small, should be reported to your caregiver. Women in their 35s and 30s should have a clinical breast exam (CBE) by a caregiver as part of a regular health exam every 1 to 3 years.  After age 72, women should have a CBE every year.  All lumps should be evaluated thoroughly by ultrasound, mammogram, magnetic resonance imaging (MRI) scan, tissue sample (biopsy) exam, or removed and examined for cancer.  If you are told you have a breast infection, make sure you follow up with your caregiver until it gets better and goes away.  If you are told a tumor is benign (noncancerous), question the diagnosis and ask for a biopsy to make sure.  All tumors should be removed and examined for cancer.  Do not disregard sharp pains in your breast. Make sure you have an answer for what is causing the pain and follow up properly.  If you have signs of pulling in (retraction) of your nipple, make sure you understand the reason for this and that proper studies are done.  If you have signs of discharge from your nipple, especially blood, make sure you are told the reason for this and that proper tests are done.  If a needle aspiration biopsy is done and is negative, make sure you know when to follow up with this, and when repeat testing should be done. Get a second opinion if necessary.  Do not rely only on mammograms. Be sure to have regular self-exams and physical breast exams by your caregiver.  Have a mammogram done on a regular basis, or as recommended. The [place] has guidelines based on age and risk factors that may be present.  If there are findings from your mammogram, make sure you follow up properly. Make sure you know when additional testing needs to be done. You should know that:  Radiation from mammograms does not cause cancer or other problems to the breast, skin, heart, or lungs.  Other screening tests for the breast are ultrasound and MRI scans.  There are medicines available that may help prevent breast cancer or help prevent recurrence in women at high risk, 58 years old or older. These medicines block estrogen hormone from getting into the  tumor:  Tamoxifen.  Raloxifene.  Trastuzumab.  Sometimes, the ovaries will be removed to decrease estrogen or progesterone hormone production. These hormones may stimulate the growth, recurrence, or spread of breast cancer. Finding out the results of your test When you have breast testing done, ask when your test results will be ready. Make sure you get your test results. FOR MORE INFORMATION American Cancer Society: www.cancer.org Document Released: 10/12/2005 Document Revised: 09/26/2011 Document Reviewed: 05/21/2009 [facility] Patient Information 2013 [place].

## 2012-06-18 NOTE — Progress Notes (Signed)
  Subjective:    Patient ID: Christina Miller, female    DOB: 1965-11-19, 46 y.o.   MRN: [id number]  HPI 1.  DUB: Seen in MAU 10/7 with heavy bleeding/clots after 6 months of amenorrhea (with irregular bleeding prior to that). Pelvic ultrasound done - normal uterus/endometrium, enlarged left ovary with 2 cm "complex" area. Seen MAU 10/13 with continued heavy bleeding/clots, lightheadedness. Hgb 11.3. Started on provera. Seen in [facility] 10/14. EMB done. Benign endometrium.  Took provera for about 2 weeks. No further bleeding except some dark blood on wiping last week.   2.  Abnl screening Mammogram:  11/14 showed bilateral areas of "asymmetry". Diagnostic mammogram and ultrasound scheduled for 12/6. Patient very worried about breast cancer.  3.  High blood pressure: Has BP appt 12/26   Review of Systems  Constitutional: Negative for fever, chills and unexpected weight change.  Eyes: Negative for visual disturbance.  Respiratory: Negative for shortness of breath.   Cardiovascular: Negative for chest pain.  Gastrointestinal: Negative for nausea, vomiting and abdominal pain.  Genitourinary: Negative for dysuria, vaginal discharge and pelvic pain.  Neurological: Negative for dizziness, light-headedness and headaches.       Objective:   Physical Exam  Constitutional: She is oriented to person, place, and time. She appears well-developed and well-nourished. She appears distressed (somewhat anxious).  HENT:  Head: Normocephalic and atraumatic.  Eyes: Conjunctivae normal and EOM are normal.  Neck: Normal range of motion. Neck supple.  Cardiovascular: Normal rate, regular rhythm and normal heart sounds.   Pulmonary/Chest: Effort normal and breath sounds normal. No respiratory distress.  Abdominal: Soft. Bowel sounds are normal. There is no tenderness. There is no rebound and no guarding.  Musculoskeletal: Normal range of motion.  Neurological: She is alert and oriented to person, place, and  time.  Skin: Skin is warm and dry.  Psychiatric:       anxious    Filed Vitals:   06/18/12 1435  BP: 154/93  Pulse: 91  Temp: 97.6 F (36.4 C)       Assessment & Plan:  46 y.o. female with DUB, abnl mammogram, HTN, L ovarian enlargement - EMB benign, likely perimenopausal, pt to call if bleeding recurs - F/U diagnostic mammogram, ultrasound  - Repeat pelvic ultrasound to f/u abnl left ovary - Return in 4 weeks to discuss results of breast findings/ultrasound

## 2012-06-19 ENCOUNTER — Other Ambulatory Visit: Payer: Self-pay

## 2012-06-20 ENCOUNTER — Encounter: Payer: Self-pay | Admitting: Family Medicine

## 2012-06-22 ENCOUNTER — Encounter (HOSPITAL_COMMUNITY): Payer: Self-pay

## 2012-06-22 ENCOUNTER — Ambulatory Visit (HOSPITAL_COMMUNITY)
Admission: RE | Admit: 2012-06-22 | Discharge: 2012-06-22 | Disposition: A | Discharge status: Home / Self Care | Payer: Self-pay | Source: Ambulatory Visit | Attending: Obstetrics and Gynecology | Admitting: Obstetrics and Gynecology

## 2012-06-22 VITALS — BP 136/84 | Temp 98.4°F | Ht 64.0 in | Wt 126.8 lb

## 2012-06-22 DIAGNOSIS — N6324 Unspecified lump in the left breast, lower inner quadrant: Secondary | ICD-10-CM | POA: Insufficient documentation

## 2012-06-22 DIAGNOSIS — Z1239 Encounter for other screening for malignant neoplasm of breast: Secondary | ICD-10-CM

## 2012-06-22 NOTE — Patient Instructions (Addendum)
Taught patient how to perform BSE and gave educational materials to take home. Let patient know will need to schedule a Pap smear due to her history and to schedule when not on menstrual period.  Patient referred to the [facility] for bilateral Diagnostic Mammogram per recommendation and possible bilateral breast ultrasound. Appointment scheduled for Tuesday, June 26, 2012 at 0830. Patient aware of appointment and will be there. Discussed smoking cessation with patient and told her about the free classes offered at the [facility]. Patient has signed up for the next class that starts in January. Patient verbalized understanding.

## 2012-06-22 NOTE — Addendum Note (Signed)
Encounter addended by: Shirley Muscat, RN on: 06/22/2012  5:11 PM<BR>     Documentation filed: Patient Instructions Section

## 2012-06-22 NOTE — Progress Notes (Addendum)
Patient referred to [facility] from the [facility] due to recommending additional imaging of both breasts. Screening mammogram completed 05/30/2012 at [facility].  Pap Smear:    Pap smear not performed today. Patients last Pap smear was 06/02/2011 at the [facility] and normal. Per patient she has had frequent abnormal Pap smears. Per patient had cryo over 5 years ago for an abnormal Pap smear. Pap smear result above is in EPIC.  Physical exam: Breasts Breasts symmetrical. No skin abnormalities bilateral breasts. No nipple retraction bilateral breasts. No nipple discharge bilateral breasts. Per patient has observed some clear/yellow colored discharge when expresses for years. No lymphadenopathy. No lumps palpated right breast. Palpated lump left breast at 7 o'clock 6 cm from the nipple. Patient complained of tenderness when palpated lump. Patient referred to the [facility] for bilateral Diagnostic Mammogram per recommendation and possible bilateral breast ultrasound. Appointment scheduled for Tuesday, June 26, 2012 at 0830.         Pelvic/Bimanual No Pap smear completed today since last Pap smear was 06/02/2011 and normal. Due to patients history would recommend for patient to schedule appointment for Pap smear. Patient on menstrual period therefore was unable to complete.

## 2012-06-26 ENCOUNTER — Other Ambulatory Visit: Payer: Self-pay | Admitting: Obstetrics & Gynecology

## 2012-06-26 ENCOUNTER — Ambulatory Visit
Admission: RE | Admit: 2012-06-26 | Discharge: 2012-06-26 | Disposition: A | Discharge status: Home / Self Care | Payer: No Typology Code available for payment source | Source: Ambulatory Visit | Attending: Obstetrics & Gynecology | Admitting: Obstetrics & Gynecology

## 2012-06-26 DIAGNOSIS — R928 Other abnormal and inconclusive findings on diagnostic imaging of breast: Secondary | ICD-10-CM

## 2012-06-26 DIAGNOSIS — N6320 Unspecified lump in the left breast, unspecified quadrant: Secondary | ICD-10-CM

## 2012-06-28 ENCOUNTER — Telehealth (HOSPITAL_COMMUNITY): Payer: Self-pay | Admitting: *Deleted

## 2012-06-28 NOTE — Telephone Encounter (Signed)
Telephoned patient at home # and scheduled pap smear for Tuesday January 21 8:45. Patient voiced understanding.

## 2012-07-03 ENCOUNTER — Other Ambulatory Visit: Payer: Self-pay | Admitting: Obstetrics & Gynecology

## 2012-07-03 ENCOUNTER — Ambulatory Visit
Admission: RE | Admit: 2012-07-03 | Discharge: 2012-07-03 | Disposition: A | Discharge status: Home / Self Care | Payer: No Typology Code available for payment source | Source: Ambulatory Visit | Attending: Obstetrics & Gynecology | Admitting: Obstetrics & Gynecology

## 2012-07-03 DIAGNOSIS — N6320 Unspecified lump in the left breast, unspecified quadrant: Secondary | ICD-10-CM

## 2012-07-03 HISTORY — PX: BREAST BIOPSY: SHX20

## 2012-07-06 ENCOUNTER — Ambulatory Visit (HOSPITAL_COMMUNITY): Admission: RE | Admit: 2012-07-06 | Payer: Self-pay | Source: Ambulatory Visit

## 2012-07-12 ENCOUNTER — Ambulatory Visit: Payer: Self-pay | Admitting: Family Medicine

## 2012-07-17 ENCOUNTER — Telehealth (HOSPITAL_COMMUNITY): Payer: Self-pay | Admitting: *Deleted

## 2012-07-17 NOTE — Telephone Encounter (Signed)
Telephoned patient at home # and discussed results of diagnostic mammogram and negative biopsy results. Screening mammogram in one year. Patient voiced understanding.

## 2012-07-19 NOTE — Addendum Note (Signed)
Encounter addended by: Shirley Muscat, RN on: 07/19/2012  4:51 PM<BR>     Documentation filed: Charges VN

## 2012-07-20 ENCOUNTER — Ambulatory Visit (HOSPITAL_COMMUNITY)
Admission: RE | Admit: 2012-07-20 | Discharge: 2012-07-20 | Disposition: A | Discharge status: Home / Self Care | Payer: Self-pay | Source: Ambulatory Visit | Attending: Family Medicine | Admitting: Family Medicine

## 2012-07-20 DIAGNOSIS — N83209 Unspecified ovarian cyst, unspecified side: Secondary | ICD-10-CM | POA: Insufficient documentation

## 2012-07-20 DIAGNOSIS — N83202 Unspecified ovarian cyst, left side: Secondary | ICD-10-CM

## 2012-08-07 ENCOUNTER — Ambulatory Visit (HOSPITAL_COMMUNITY): Payer: Self-pay

## 2012-08-08 ENCOUNTER — Emergency Department (HOSPITAL_COMMUNITY)
Admission: EM | Admit: 2012-08-08 | Discharge: 2012-08-08 | Disposition: A | Discharge status: Home / Self Care | Payer: Self-pay | Attending: Emergency Medicine | Admitting: Emergency Medicine

## 2012-08-08 ENCOUNTER — Encounter (HOSPITAL_COMMUNITY): Payer: Self-pay | Admitting: Emergency Medicine

## 2012-08-08 DIAGNOSIS — F1010 Alcohol abuse, uncomplicated: Secondary | ICD-10-CM | POA: Insufficient documentation

## 2012-08-08 DIAGNOSIS — F10929 Alcohol use, unspecified with intoxication, unspecified: Secondary | ICD-10-CM

## 2012-08-08 DIAGNOSIS — Z8709 Personal history of other diseases of the respiratory system: Secondary | ICD-10-CM | POA: Insufficient documentation

## 2012-08-08 DIAGNOSIS — Z862 Personal history of diseases of the blood and blood-forming organs and certain disorders involving the immune mechanism: Secondary | ICD-10-CM | POA: Insufficient documentation

## 2012-08-08 DIAGNOSIS — H6690 Otitis media, unspecified, unspecified ear: Secondary | ICD-10-CM | POA: Insufficient documentation

## 2012-08-08 DIAGNOSIS — J4489 Other specified chronic obstructive pulmonary disease: Secondary | ICD-10-CM | POA: Insufficient documentation

## 2012-08-08 DIAGNOSIS — H9209 Otalgia, unspecified ear: Secondary | ICD-10-CM | POA: Insufficient documentation

## 2012-08-08 DIAGNOSIS — I10 Essential (primary) hypertension: Secondary | ICD-10-CM | POA: Insufficient documentation

## 2012-08-08 DIAGNOSIS — Z87440 Personal history of urinary (tract) infections: Secondary | ICD-10-CM | POA: Insufficient documentation

## 2012-08-08 DIAGNOSIS — J449 Chronic obstructive pulmonary disease, unspecified: Secondary | ICD-10-CM | POA: Insufficient documentation

## 2012-08-08 DIAGNOSIS — Z8669 Personal history of other diseases of the nervous system and sense organs: Secondary | ICD-10-CM | POA: Insufficient documentation

## 2012-08-08 DIAGNOSIS — F17200 Nicotine dependence, unspecified, uncomplicated: Secondary | ICD-10-CM | POA: Insufficient documentation

## 2012-08-08 DIAGNOSIS — Z8781 Personal history of (healed) traumatic fracture: Secondary | ICD-10-CM | POA: Insufficient documentation

## 2012-08-08 DIAGNOSIS — J438 Other emphysema: Secondary | ICD-10-CM | POA: Insufficient documentation

## 2012-08-08 LAB — CBC WITH DIFFERENTIAL/PLATELET
Eosinophils Relative: 6 % — ABNORMAL HIGH (ref 0–5)
HCT: 39.7 % (ref 36.0–46.0)
Lymphocytes Relative: 53 % — ABNORMAL HIGH (ref 12–46)
Lymphs Abs: 4.3 10*3/uL — ABNORMAL HIGH (ref 0.7–4.0)
MCH: 31.8 pg (ref 26.0–34.0)
MCV: 93.4 fL (ref 78.0–100.0)
Monocytes Absolute: 0.4 10*3/uL (ref 0.1–1.0)
Monocytes Relative: 5 % (ref 3–12)
RBC: 4.25 MIL/uL (ref 3.87–5.11)
WBC: 8.1 10*3/uL (ref 4.0–10.5)

## 2012-08-08 LAB — BASIC METABOLIC PANEL
CO2: 23 mEq/L (ref 19–32)
Chloride: 108 mEq/L (ref 96–112)
Glucose, Bld: 86 mg/dL (ref 70–99)
Sodium: 143 mEq/L (ref 135–145)

## 2012-08-08 LAB — TROPONIN I: Troponin I: <0.3 ng/mL (ref <0.30)

## 2012-08-08 MED ORDER — SODIUM CHLORIDE 0.9 % IV BOLUS (SEPSIS)
1000.0000 mL | Freq: Once | INTRAVENOUS | Status: AC
  Administered 2012-08-08: 1000 mL via INTRAVENOUS

## 2012-08-08 MED ORDER — AMOXICILLIN 500 MG PO CAPS: 500.0000 mg | ORAL_CAPSULE | Freq: Three times a day (TID) | ORAL | Status: DC

## 2012-08-08 MED ORDER — VITAMIN B-1 100 MG PO TABS
50.0000 mg | ORAL_TABLET | Freq: Once | ORAL | Status: AC
  Administered 2012-08-08: 50 mg via ORAL
  Filled 2012-08-08: qty 1

## 2012-08-08 MED ORDER — IBUPROFEN 600 MG PO TABS: 600.0000 mg | ORAL_TABLET | Freq: Four times a day (QID) | ORAL | Status: DC | PRN

## 2012-08-08 MED ORDER — KETOROLAC TROMETHAMINE 30 MG/ML IJ SOLN
30.0000 mg | Freq: Once | INTRAMUSCULAR | Status: AC
  Administered 2012-08-08: 30 mg via INTRAVENOUS
  Filled 2012-08-08: qty 1

## 2012-08-08 MED ORDER — HYDROCODONE-ACETAMINOPHEN 5-325 MG PO TABS: 1.0000 | ORAL_TABLET | ORAL | Status: DC | PRN

## 2012-08-08 NOTE — ED Notes (Signed)
MD at bedside. Kathrynn Humble MD

## 2012-08-08 NOTE — ED Notes (Signed)
BIB [facility]. ETOH. Has been drinking today. Daughter called EMS after patient appeared to not be behaving appropriate. Patient states that she only feels nauseous. VSS for transport. CBG 83.

## 2012-08-08 NOTE — ED Notes (Signed)
Patients daughter present. Daughter states that patient had a brief moment of questionable LOC. Unsure.

## 2012-08-08 NOTE — ED Notes (Signed)
Patient states "I guess I passed out or something. I didn't call rescue squad"

## 2012-08-08 NOTE — ED Notes (Signed)
Patients daughter: Pricilla Loveless [phone]

## 2012-08-10 NOTE — ED Provider Notes (Signed)
History     CSN: [id number]  Arrival date & time 08/08/12  1757   First MD Initiated Contact with Patient 08/08/12 1818      Chief Complaint  Patient presents with  . Alcohol Intoxication    (Consider location/radiation/quality/duration/timing/severity/associated sxs/prior treatment) HPI Comments: Pt comes in with cc of alcohol intoxication. Pt is unsure why she was sent to the ER. She states that she has been having some right sided ear pain for the past few days, no drainage from the ear, no hearing loss. She admits to heavy drinking today, no n/v/f/c/chest pain/sob. Doesn't think she passed out or had seizures. Spoke with boy friend, who states that patient was intoxicated, not responding, so EMS was called. No illicit use per patient.   Patient is a 47 y.o. female presenting with intoxication. The history is provided by the patient.  Alcohol Intoxication Pertinent negatives include no chest pain, no abdominal pain, no headaches and no shortness of breath.    Past Medical History  Diagnosis Date  . Emphysema   . COPD (chronic obstructive pulmonary disease)   . MS (multiple sclerosis)   . Hypertension   . Headache   . Anemia   . Infection     urinary tract infection  . Bone fracture     ankle  . Abnormal Pap smear     cryo    Past Surgical History  Procedure Date  . Tubal ligation   . Arm wound repair / closure   . Dilation and curettage of uterus     Family History  Problem Relation Age of Onset  . Other Neg Hx   . Diabetes Father   . Diabetes Sister   . Hypertension Sister   . Diabetes Brother   . Hypertension Brother     History  Substance Use Topics  . Smoking status: Current Every Day Smoker -- 1.0 packs/day for 35 years    Types: Cigarettes  . Smokeless tobacco: Not on file  . Alcohol Use: Yes     Comment: couple times a wk    OB History    Grav Para Term Preterm Abortions TAB SAB Ect Mult Living   7 2 1 1 5 2 3   2       Review of  Systems  Constitutional: Negative for activity change.  HENT: Positive for ear pain. Negative for neck pain.   Respiratory: Negative for shortness of breath.   Cardiovascular: Negative for chest pain.  Gastrointestinal: Negative for nausea, vomiting and abdominal pain.  Genitourinary: Negative for dysuria.  Neurological: Negative for headaches.    Allergies  Review of patient's allergies indicates no known allergies.  Home Medications   Current Outpatient Rx  Name  Route  Sig  Dispense  Refill  . AMOXICILLIN 500 MG PO CAPS   Oral   Take 1 capsule (500 mg total) by mouth 3 (three) times daily.   21 capsule   0   . IBUPROFEN 600 MG PO TABS   Oral   Take 1 tablet (600 mg total) by mouth every 6 (six) hours as needed for pain.   30 tablet   0     BP 109/76  Pulse 87  Temp 97.7 F (36.5 C) (Oral)  Resp 20  SpO2 96%  Physical Exam  Constitutional: She is oriented to person, place, and time. She appears well-developed and well-nourished.  HENT:  Head: Normocephalic and atraumatic.       Right ear  -loss of  landmarks, effusion, erythema. No mastoid tenderness, no exudates appreciated.  Eyes: EOM are normal. Pupils are equal, round, and reactive to light.  Neck: Neck supple.  Cardiovascular: Normal rate, regular rhythm and normal heart sounds.   No murmur heard. Pulmonary/Chest: Effort normal. No respiratory distress.  Abdominal: Soft. She exhibits no distension. There is no tenderness. There is no rebound and no guarding.  Neurological: She is alert and oriented to person, place, and time.  Skin: Skin is warm and dry.    ED Course  Procedures (including critical care time)  Labs Reviewed  BASIC METABOLIC PANEL - Abnormal; Notable for the following:    Calcium 8.3 (*)     All other components within normal limits  CBC WITH DIFFERENTIAL - Abnormal; Notable for the following:    Neutrophils Relative 36 (*)     Lymphocytes Relative 53 (*)     Lymphs Abs 4.3 (*)      Eosinophils Relative 6 (*)     All other components within normal limits  ETHANOL - Abnormal; Notable for the following:    Alcohol, Ethyl (B) 349 (*)     All other components within normal limits  TROPONIN I  LAB REPORT - SCANNED   No results found.   1. Alcohol intoxication   2. Otitis media       MDM  Pt comes in with AMS. She is intoxicated - but alert and oriented x 3 for me. Doesn't appear to be syncope. She has no cardiac dz, no witnessed syncope  - so appears more like she was not responding.  We will get basic labs, hydrate. Tx for AOM will be provided.  LATE ENTRY: About an hour after arrival, family arrived- gave additional hx, that is not indicative of syncope. Will d/c to family.  Varney Biles, MD 08/10/12 [phone]

## 2012-10-28 ENCOUNTER — Encounter (HOSPITAL_COMMUNITY): Payer: Self-pay | Admitting: Family Medicine

## 2012-10-28 ENCOUNTER — Emergency Department (HOSPITAL_COMMUNITY): Payer: Self-pay

## 2012-10-28 ENCOUNTER — Emergency Department (HOSPITAL_COMMUNITY)
Admission: EM | Admit: 2012-10-28 | Discharge: 2012-10-28 | Disposition: A | Discharge status: Home / Self Care | Payer: Self-pay | Attending: Emergency Medicine | Admitting: Emergency Medicine

## 2012-10-28 DIAGNOSIS — G35 Multiple sclerosis: Secondary | ICD-10-CM | POA: Insufficient documentation

## 2012-10-28 DIAGNOSIS — W19XXXA Unspecified fall, initial encounter: Secondary | ICD-10-CM | POA: Insufficient documentation

## 2012-10-28 DIAGNOSIS — Z862 Personal history of diseases of the blood and blood-forming organs and certain disorders involving the immune mechanism: Secondary | ICD-10-CM | POA: Insufficient documentation

## 2012-10-28 DIAGNOSIS — J438 Other emphysema: Secondary | ICD-10-CM | POA: Insufficient documentation

## 2012-10-28 DIAGNOSIS — Y929 Unspecified place or not applicable: Secondary | ICD-10-CM | POA: Insufficient documentation

## 2012-10-28 DIAGNOSIS — S20212A Contusion of left front wall of thorax, initial encounter: Secondary | ICD-10-CM

## 2012-10-28 DIAGNOSIS — Z8742 Personal history of other diseases of the female genital tract: Secondary | ICD-10-CM | POA: Insufficient documentation

## 2012-10-28 DIAGNOSIS — Y9389 Activity, other specified: Secondary | ICD-10-CM | POA: Insufficient documentation

## 2012-10-28 DIAGNOSIS — Z87440 Personal history of urinary (tract) infections: Secondary | ICD-10-CM | POA: Insufficient documentation

## 2012-10-28 DIAGNOSIS — Z8739 Personal history of other diseases of the musculoskeletal system and connective tissue: Secondary | ICD-10-CM | POA: Insufficient documentation

## 2012-10-28 DIAGNOSIS — Z8679 Personal history of other diseases of the circulatory system: Secondary | ICD-10-CM | POA: Insufficient documentation

## 2012-10-28 DIAGNOSIS — J449 Chronic obstructive pulmonary disease, unspecified: Secondary | ICD-10-CM

## 2012-10-28 DIAGNOSIS — I10 Essential (primary) hypertension: Secondary | ICD-10-CM | POA: Insufficient documentation

## 2012-10-28 LAB — CBC WITH DIFFERENTIAL/PLATELET
Eosinophils Absolute: 0 10*3/uL (ref 0.0–0.7)
HCT: 41.4 % (ref 36.0–46.0)
Hemoglobin: 14.9 g/dL (ref 12.0–15.0)
Lymphs Abs: 2.4 10*3/uL (ref 0.7–4.0)
MCH: 33.3 pg (ref 26.0–34.0)
MCV: 92.4 fL (ref 78.0–100.0)
Monocytes Absolute: 1.1 10*3/uL — ABNORMAL HIGH (ref 0.1–1.0)
Monocytes Relative: 9 % (ref 3–12)
Neutrophils Relative %: 73 % (ref 43–77)
RBC: 4.48 MIL/uL (ref 3.87–5.11)

## 2012-10-28 LAB — URINE MICROSCOPIC-ADD ON

## 2012-10-28 LAB — URINALYSIS, ROUTINE W REFLEX MICROSCOPIC
Bilirubin Urine: NEGATIVE
Ketones, ur: 15 mg/dL — AB
Nitrite: NEGATIVE
Urobilinogen, UA: 0.2 mg/dL (ref 0.0–1.0)

## 2012-10-28 LAB — BASIC METABOLIC PANEL
CO2: 19 mEq/L (ref 19–32)
Calcium: 9.2 mg/dL (ref 8.4–10.5)
Glucose, Bld: 106 mg/dL — ABNORMAL HIGH (ref 70–99)
Sodium: 134 mEq/L — ABNORMAL LOW (ref 135–145)

## 2012-10-28 LAB — RAPID URINE DRUG SCREEN, HOSP PERFORMED
Amphetamines: NOT DETECTED
Barbiturates: NOT DETECTED
Benzodiazepines: NOT DETECTED
Cocaine: NOT DETECTED
Tetrahydrocannabinol: NOT DETECTED

## 2012-10-28 MED ORDER — OXYCODONE-ACETAMINOPHEN 5-325 MG PO TABS: 1.0000 | ORAL_TABLET | ORAL | Status: DC | PRN

## 2012-10-28 MED ORDER — IPRATROPIUM BROMIDE 0.02 % IN SOLN
0.5000 mg | Freq: Once | RESPIRATORY_TRACT | Status: AC
  Administered 2012-10-28: 0.5 mg via RESPIRATORY_TRACT
  Filled 2012-10-28: qty 2.5

## 2012-10-28 MED ORDER — DIAZEPAM 5 MG PO TABS: 5.0000 mg | ORAL_TABLET | Freq: Two times a day (BID) | ORAL | Status: DC

## 2012-10-28 MED ORDER — ALBUTEROL SULFATE (5 MG/ML) 0.5% IN NEBU
5.0000 mg | INHALATION_SOLUTION | Freq: Once | RESPIRATORY_TRACT | Status: AC
  Administered 2012-10-28: 5 mg via RESPIRATORY_TRACT
  Filled 2012-10-28: qty 1

## 2012-10-28 MED ORDER — FENTANYL CITRATE 0.05 MG/ML IJ SOLN
100.0000 ug | Freq: Once | INTRAMUSCULAR | Status: AC
  Administered 2012-10-28: 100 ug via INTRAVENOUS
  Filled 2012-10-28: qty 2

## 2012-10-28 MED ORDER — FENTANYL CITRATE 0.05 MG/ML IJ SOLN
50.0000 ug | Freq: Once | INTRAMUSCULAR | Status: AC
  Administered 2012-10-28: 50 ug via INTRAVENOUS
  Filled 2012-10-28: qty 2

## 2012-10-28 MED ORDER — SODIUM CHLORIDE 0.9 % IV BOLUS (SEPSIS)
1000.0000 mL | Freq: Once | INTRAVENOUS | Status: AC
  Administered 2012-10-28: 1000 mL via INTRAVENOUS

## 2012-10-28 NOTE — ED Provider Notes (Signed)
Medical screening examination/treatment/procedure(s) were performed by non-physician practitioner and as supervising physician I was immediately available for consultation/collaboration.  Mervin Kung, MD 10/28/12 [phone]

## 2012-10-28 NOTE — ED Provider Notes (Signed)
History     CSN: [id number]  Arrival date & time 10/28/12  1238   First MD Initiated Contact with Patient 10/28/12 1506      Chief Complaint  Patient presents with  . Fall    (Consider location/radiation/quality/duration/timing/severity/associated sxs/prior treatment) HPI  Patient presents to the ED with complaints of left sided rib/back pain. Significant other in the room says she was going in between two couches when she lost her balance and fell on a harder chair. She was not immediately having pain which is why she did not come to the ED last night. This morning the pain has been so bad she has had difficulty ambulating. She has a positive PMH for Multiple Sclerosis, COPD (smoking), hypertension. She has had a URI for the past week or so and is now wheezing. She denies fevers. Positive SOB. She said that the pain is so bad that she can hardly move. She feels as though her ribs are broken. Pts O2 sats at between 92-95% in room. Pulse is 103. Temp is 98.   Past Medical History  Diagnosis Date  . Emphysema   . COPD (chronic obstructive pulmonary disease)   . MS (multiple sclerosis)   . Hypertension   . Headache   . Anemia   . Infection     urinary tract infection  . Bone fracture     ankle  . Abnormal Pap smear     cryo    Past Surgical History  Procedure Laterality Date  . Tubal ligation    . Arm wound repair / closure    . Dilation and curettage of uterus      Family History  Problem Relation Age of Onset  . Other Neg Hx   . Diabetes Father   . Diabetes Sister   . Hypertension Sister   . Diabetes Brother   . Hypertension Brother     History  Substance Use Topics  . Smoking status: Current Every Day Smoker -- 1.00 packs/day for 35 years    Types: Cigarettes  . Smokeless tobacco: Not on file  . Alcohol Use: Yes     Comment: couple times a wk    OB History   Grav Para Term Preterm Abortions TAB SAB Ect Mult Living   7 2 1 1 5 2 3   2       Review of  Systems  All other systems reviewed and are negative.    Allergies  Review of patient's allergies indicates no known allergies.  Home Medications   Current Outpatient Rx  Name  Route  Sig  Dispense  Refill  . diazepam (VALIUM) 5 MG tablet   Oral   Take 1 tablet (5 mg total) by mouth 2 (two) times daily.   10 tablet   0   . oxyCODONE-acetaminophen (PERCOCET/ROXICET) 5-325 MG per tablet   Oral   Take 1 tablet by mouth every 4 (four) hours as needed for pain.   20 tablet   0     BP 153/98  Pulse 105  Temp(Src) 98 F (36.7 C) (Oral)  Resp 20  SpO2 94%  LMP 06/20/2012  Physical Exam  Nursing note and vitals reviewed. Constitutional: She appears well-developed and well-nourished. No distress.  HENT:  Head: Normocephalic and atraumatic.  Eyes: Pupils are equal, round, and reactive to light.  Neck: Normal range of motion. Neck supple.  Cardiovascular: Normal rate and regular rhythm.   Pulmonary/Chest: Accessory muscle usage present. Tachypnea noted. No apnea. She  has wheezes. She has rhonchi in the right upper field, the right middle field, the right lower field, the left upper field, the left middle field and the left lower field. Chest wall is not dull to percussion. She exhibits tenderness, bony tenderness and swelling. She exhibits no laceration, no crepitus, no edema, no deformity and no retraction.  Abdominal: Soft.  Musculoskeletal:       Lumbar back: She exhibits decreased range of motion, tenderness, bony tenderness, swelling, pain and spasm. She exhibits no edema, no deformity, no laceration and normal pulse.       Back:  Neurological: She is alert.  Skin: Skin is warm and dry.    ED Course  Procedures (including critical care time)  Labs Reviewed  CBC WITH DIFFERENTIAL - Abnormal; Notable for the following:    WBC 13.0 (*)    Neutro Abs 9.5 (*)    Monocytes Absolute 1.1 (*)    All other components within normal limits  PRO B NATRIURETIC PEPTIDE -  Abnormal; Notable for the following:    Pro B Natriuretic peptide (BNP) 527.5 (*)    All other components within normal limits  BASIC METABOLIC PANEL - Abnormal; Notable for the following:    Sodium 134 (*)    Glucose, Bld 106 (*)    All other components within normal limits  URINALYSIS, ROUTINE W REFLEX MICROSCOPIC - Abnormal; Notable for the following:    Color, Urine AMBER (*)    APPearance TURBID (*)    Hgb urine dipstick MODERATE (*)    Ketones, ur 15 (*)    Protein, ur 100 (*)    All other components within normal limits  URINE MICROSCOPIC-ADD ON - Abnormal; Notable for the following:    Squamous Epithelial / LPF FEW (*)    Bacteria, UA FEW (*)    All other components within normal limits  ETHANOL  URINE RAPID DRUG SCREEN (HOSP PERFORMED)  POCT I-STAT TROPONIN I   Dg Ribs Unilateral W/chest Left  10/28/2012  *RADIOLOGY REPORT*  Clinical Data: Fall, left lateral chest pain  LEFT RIBS AND CHEST - 3+ VIEW  Comparison: 06/18/2007.  Findings: Lungs are clear. No pleural effusion or pneumothorax.  Cardiomediastinal silhouette is within normal limits.  No displaced left rib fracture is seen.  IMPRESSION: No evidence of acute cardiopulmonary disease.  No displaced left rib fracture is seen.   Original Report Authenticated By: Julian Hy, M.D.      1. COPD (chronic obstructive pulmonary disease)   2. Rib contusion, left, initial encounter   3. Multiple sclerosis       MDM  3:53pm Patient is having increased effort of breathing with audible crackles. She is unable to move at all without severe pain to her left side.  Pain medication, fluids, cbc, bmp, bnp, trop. Etoh, uds and xrays initiated as well as breathing treatment.  6:52 pm- patients work-up is benign. She has MS which is causing her muscle spasms here in the ED over the area of injnury. She is a family practice patient. She missed her last appointment and needs to call and schedule another one for management of her  chronic issues.  Discussed case with Dr. Rogene Houston.  Pt has been advised of the symptoms that warrant their return to the ED. Patient has voiced understanding and has agreed to follow-up with the PCP or specialist.   Linus Mako, PA-C 10/28/12 1854

## 2012-10-28 NOTE — ED Notes (Signed)
Pt. Reports fell on back last night. C/o left lateral rib pain, tender to palpation. No bruising noted. O2 sat 100% on room air. Coarse crackles heard in upper lungs. Pt. Yelling out in pain.

## 2012-10-28 NOTE — ED Notes (Signed)
Per pt sts tripped and fell last night on a chair. sts couldn't even get out of the bed this am. sts left upper back and rib pain and lower back pain.

## 2012-11-04 ENCOUNTER — Encounter (HOSPITAL_COMMUNITY): Payer: Self-pay | Admitting: Cardiology

## 2012-11-04 ENCOUNTER — Emergency Department (INDEPENDENT_AMBULATORY_CARE_PROVIDER_SITE_OTHER)
Admission: EM | Admit: 2012-11-04 | Discharge: 2012-11-04 | Disposition: A | Discharge status: Home / Self Care | Payer: Self-pay | Source: Home / Self Care | Attending: Emergency Medicine | Admitting: Emergency Medicine

## 2012-11-04 ENCOUNTER — Encounter (HOSPITAL_COMMUNITY): Payer: Self-pay

## 2012-11-04 ENCOUNTER — Emergency Department (HOSPITAL_COMMUNITY)
Admission: EM | Admit: 2012-11-04 | Discharge: 2012-11-04 | Disposition: A | Discharge status: Home / Self Care | Payer: Self-pay | Attending: Emergency Medicine | Admitting: Emergency Medicine

## 2012-11-04 ENCOUNTER — Emergency Department (HOSPITAL_COMMUNITY): Payer: Self-pay

## 2012-11-04 DIAGNOSIS — J449 Chronic obstructive pulmonary disease, unspecified: Secondary | ICD-10-CM | POA: Insufficient documentation

## 2012-11-04 DIAGNOSIS — R109 Unspecified abdominal pain: Secondary | ICD-10-CM

## 2012-11-04 DIAGNOSIS — Z8709 Personal history of other diseases of the respiratory system: Secondary | ICD-10-CM | POA: Insufficient documentation

## 2012-11-04 DIAGNOSIS — W19XXXA Unspecified fall, initial encounter: Secondary | ICD-10-CM

## 2012-11-04 DIAGNOSIS — S3981XA Other specified injuries of abdomen, initial encounter: Secondary | ICD-10-CM | POA: Insufficient documentation

## 2012-11-04 DIAGNOSIS — S2249XA Multiple fractures of ribs, unspecified side, initial encounter for closed fracture: Secondary | ICD-10-CM | POA: Insufficient documentation

## 2012-11-04 DIAGNOSIS — Y929 Unspecified place or not applicable: Secondary | ICD-10-CM | POA: Insufficient documentation

## 2012-11-04 DIAGNOSIS — W1809XA Striking against other object with subsequent fall, initial encounter: Secondary | ICD-10-CM | POA: Insufficient documentation

## 2012-11-04 DIAGNOSIS — Z8669 Personal history of other diseases of the nervous system and sense organs: Secondary | ICD-10-CM | POA: Insufficient documentation

## 2012-11-04 DIAGNOSIS — Z8781 Personal history of (healed) traumatic fracture: Secondary | ICD-10-CM | POA: Insufficient documentation

## 2012-11-04 DIAGNOSIS — Z87440 Personal history of urinary (tract) infections: Secondary | ICD-10-CM | POA: Insufficient documentation

## 2012-11-04 DIAGNOSIS — I10 Essential (primary) hypertension: Secondary | ICD-10-CM | POA: Insufficient documentation

## 2012-11-04 DIAGNOSIS — Z79899 Other long term (current) drug therapy: Secondary | ICD-10-CM | POA: Insufficient documentation

## 2012-11-04 DIAGNOSIS — S2232XA Fracture of one rib, left side, initial encounter for closed fracture: Secondary | ICD-10-CM

## 2012-11-04 DIAGNOSIS — S32009A Unspecified fracture of unspecified lumbar vertebra, initial encounter for closed fracture: Secondary | ICD-10-CM | POA: Insufficient documentation

## 2012-11-04 DIAGNOSIS — R319 Hematuria, unspecified: Secondary | ICD-10-CM

## 2012-11-04 DIAGNOSIS — Z862 Personal history of diseases of the blood and blood-forming organs and certain disorders involving the immune mechanism: Secondary | ICD-10-CM | POA: Insufficient documentation

## 2012-11-04 DIAGNOSIS — F17200 Nicotine dependence, unspecified, uncomplicated: Secondary | ICD-10-CM | POA: Insufficient documentation

## 2012-11-04 DIAGNOSIS — J4489 Other specified chronic obstructive pulmonary disease: Secondary | ICD-10-CM | POA: Insufficient documentation

## 2012-11-04 DIAGNOSIS — Y9389 Activity, other specified: Secondary | ICD-10-CM | POA: Insufficient documentation

## 2012-11-04 LAB — URINALYSIS, ROUTINE W REFLEX MICROSCOPIC
Ketones, ur: NEGATIVE mg/dL
Leukocytes, UA: NEGATIVE
Nitrite: NEGATIVE
Specific Gravity, Urine: 1.029 (ref 1.005–1.030)
Urobilinogen, UA: 1 mg/dL (ref 0.0–1.0)
pH: 6 (ref 5.0–8.0)

## 2012-11-04 LAB — CBC WITH DIFFERENTIAL/PLATELET
Basophils Absolute: 0 10*3/uL (ref 0.0–0.1)
Basophils Relative: 1 % (ref 0–1)
Eosinophils Absolute: 0.3 10*3/uL (ref 0.0–0.7)
Hemoglobin: 14.7 g/dL (ref 12.0–15.0)
MCHC: 34.7 g/dL (ref 30.0–36.0)
Monocytes Relative: 9 % (ref 3–12)
Neutro Abs: 4.7 10*3/uL (ref 1.7–7.7)
Neutrophils Relative %: 61 % (ref 43–77)
Platelets: 201 10*3/uL (ref 150–400)

## 2012-11-04 LAB — POCT URINALYSIS DIP (DEVICE)
Ketones, ur: NEGATIVE mg/dL
Leukocytes, UA: NEGATIVE
Nitrite: NEGATIVE
Protein, ur: 100 mg/dL — AB

## 2012-11-04 LAB — POCT I-STAT, CHEM 8
HCT: 46 % (ref 36.0–46.0)
Hemoglobin: 15.6 g/dL — ABNORMAL HIGH (ref 12.0–15.0)
Potassium: 3.7 mEq/L (ref 3.5–5.1)
Sodium: 142 mEq/L (ref 135–145)
TCO2: 27 mmol/L (ref 0–100)

## 2012-11-04 LAB — CK: Total CK: 87 U/L (ref 7–177)

## 2012-11-04 LAB — URINE MICROSCOPIC-ADD ON

## 2012-11-04 MED ORDER — OXYCODONE-ACETAMINOPHEN 5-325 MG PO TABS
2.0000 | ORAL_TABLET | Freq: Once | ORAL | Status: AC
  Administered 2012-11-04: 2 via ORAL
  Filled 2012-11-04: qty 2

## 2012-11-04 MED ORDER — MORPHINE SULFATE 4 MG/ML IJ SOLN
4.0000 mg | Freq: Once | INTRAMUSCULAR | Status: AC
  Administered 2012-11-04: 4 mg via INTRAVENOUS

## 2012-11-04 MED ORDER — ONDANSETRON HCL 4 MG/2ML IJ SOLN
4.0000 mg | Freq: Once | INTRAMUSCULAR | Status: DC
  Filled 2012-11-04: qty 2

## 2012-11-04 MED ORDER — OXYCODONE-ACETAMINOPHEN 5-325 MG PO TABS: 2.0000 | ORAL_TABLET | Freq: Four times a day (QID) | ORAL | Status: DC | PRN

## 2012-11-04 MED ORDER — IOHEXOL 300 MG/ML  SOLN
50.0000 mL | Freq: Once | INTRAMUSCULAR | Status: AC | PRN
  Administered 2012-11-04: 50 mL via ORAL

## 2012-11-04 MED ORDER — OXYCODONE-ACETAMINOPHEN 5-325 MG PO TABS: 2.0000 | ORAL_TABLET | Freq: Once | ORAL | Status: DC

## 2012-11-04 MED ORDER — MORPHINE SULFATE 4 MG/ML IJ SOLN
4.0000 mg | Freq: Once | INTRAMUSCULAR | Status: DC
  Filled 2012-11-04: qty 1

## 2012-11-04 MED ORDER — ONDANSETRON HCL 4 MG/2ML IJ SOLN
4.0000 mg | Freq: Once | INTRAMUSCULAR | Status: AC
  Administered 2012-11-04: 4 mg via INTRAVENOUS

## 2012-11-04 MED ORDER — IOHEXOL 300 MG/ML  SOLN
100.0000 mL | Freq: Once | INTRAMUSCULAR | Status: AC | PRN
  Administered 2012-11-04: 100 mL via INTRAVENOUS

## 2012-11-04 NOTE — ED Notes (Signed)
Pain in rib area; seen 4-13; concern for undetected rib fx on that visit

## 2012-11-04 NOTE — ED Notes (Addendum)
Reports fell 8 days ago, seen here & D/C'd home. C/o continued pain but states pain now radiating into LLQ. Denies dysuria, has frequent urination x 2 months. LLQ area tender to palpation. States pain worse with mvmt & when laying on that side.

## 2012-11-04 NOTE — ED Notes (Signed)
Pt reports she feel on 4/13 and hurt her ribs. States she recently noticed some blood in her urine. Pt reports pain is mostly on the left side and tender with palpation. No distress noted. UA done at [facility]

## 2012-11-04 NOTE — ED Provider Notes (Signed)
History     CSN: [id number]  Arrival date & time 11/04/12  1400   First MD Initiated Contact with Patient 11/04/12 1526      Chief Complaint  Patient presents with  . Flank Pain  . Hematuria    (Consider location/radiation/quality/duration/timing/severity/associated sxs/prior treatment) Patient is a 47 y.o. female presenting with flank pain. The history is provided by the patient, the spouse and medical records.  Flank Pain This is a new problem. Episode onset: 8 days ago, since fall onto couch. The problem occurs constantly. The problem has been unchanged. Associated symptoms include abdominal pain (LUQ). Pertinent negatives include no chest pain, chills, coughing, fever, neck pain, vomiting or weakness. The symptoms are aggravated by bending, coughing and exertion. She has tried lying down for the symptoms. The treatment provided mild relief.    Past Medical History  Diagnosis Date  . Emphysema   . COPD (chronic obstructive pulmonary disease)   . MS (multiple sclerosis)   . Hypertension   . Headache   . Anemia   . Infection     urinary tract infection  . Bone fracture     ankle  . Abnormal Pap smear     cryo    Past Surgical History  Procedure Laterality Date  . Tubal ligation    . Arm wound repair / closure    . Dilation and curettage of uterus      Family History  Problem Relation Age of Onset  . Other Neg Hx   . Diabetes Father   . Diabetes Sister   . Hypertension Sister   . Diabetes Brother   . Hypertension Brother     History  Substance Use Topics  . Smoking status: Current Every Day Smoker -- 1.00 packs/day for 35 years    Types: Cigarettes  . Smokeless tobacco: Not on file  . Alcohol Use: Yes     Comment: couple times a wk    OB History   Grav Para Term Preterm Abortions TAB SAB Ect Mult Living   7 2 1 1 5 2 3   2       Review of Systems  Constitutional: Negative for fever and chills.  HENT: Negative for neck pain and neck stiffness.    Respiratory: Negative for cough, chest tightness, shortness of breath and wheezing.   Cardiovascular: Negative for chest pain and palpitations.  Gastrointestinal: Positive for abdominal pain (LUQ). Negative for vomiting, diarrhea, constipation and abdominal distention.  Genitourinary: Positive for flank pain (left). Negative for dysuria, frequency, hematuria, decreased urine volume and difficulty urinating.  Skin: Negative for wound.  Neurological: Negative for syncope, weakness and light-headedness.  Hematological: Does not bruise/bleed easily.  Psychiatric/Behavioral: Negative for confusion and agitation.  All other systems reviewed and are negative.    Allergies  Review of patient's allergies indicates no known allergies.  Home Medications   Current Outpatient Rx  Name  Route  Sig  Dispense  Refill  . diazepam (VALIUM) 5 MG tablet   Oral   Take 1 tablet (5 mg total) by mouth 2 (two) times daily.   10 tablet   0   . ibuprofen (ADVIL,MOTRIN) 200 MG tablet   Oral   Take 600 mg by mouth every 6 (six) hours as needed for pain.         Marland Kitchen oxyCODONE-acetaminophen (PERCOCET/ROXICET) 5-325 MG per tablet   Oral   Take 1 tablet by mouth every 4 (four) hours as needed for pain.   20 tablet  0     BP 150/93  Pulse 89  Temp(Src) 97.9 F (36.6 C) (Oral)  Resp 18  SpO2 99%  LMP 06/20/2012  Physical Exam  Nursing note and vitals reviewed. Constitutional: She is oriented to person, place, and time. She appears well-developed and well-nourished.  HENT:  Head: Normocephalic and atraumatic.  Right Ear: External ear normal.  Left Ear: External ear normal.  Nose: Nose normal.  Mouth/Throat: Oropharynx is clear and moist. No oropharyngeal exudate.  Eyes: Conjunctivae are normal.  Neck: Normal range of motion. Neck supple.  Cardiovascular: Normal rate, regular rhythm, normal heart sounds and intact distal pulses.  Exam reveals no gallop and no friction rub.   No murmur  heard. Pulmonary/Chest: Effort normal and breath sounds normal. No respiratory distress. She has no wheezes. She has no rales. She exhibits no tenderness.  Abdominal: Soft. Bowel sounds are normal. She exhibits no distension and no mass. There is no tenderness. There is no rebound and no guarding.  Musculoskeletal: Normal range of motion. She exhibits tenderness (overlying lumbar spine and left costal margin in LUQ and left flank). She exhibits no edema.  Neurological: She is alert and oriented to person, place, and time.  Skin: Skin is warm and dry.  Psychiatric: She has a normal mood and affect. Her behavior is normal. Judgment and thought content normal.    ED Course  Procedures (including critical care time)  Labs Reviewed  URINALYSIS, ROUTINE W REFLEX MICROSCOPIC - Abnormal; Notable for the following:    APPearance CLOUDY (*)    Hgb urine dipstick SMALL (*)    Protein, ur 30 (*)    All other components within normal limits  URINE MICROSCOPIC-ADD ON - Abnormal; Notable for the following:    Squamous Epithelial / LPF FEW (*)    Bacteria, UA FEW (*)    All other components within normal limits  POCT I-STAT, CHEM 8 - Abnormal; Notable for the following:    Glucose, Bld 117 (*)    Hemoglobin 15.6 (*)    All other components within normal limits  CBC WITH DIFFERENTIAL  CK   Ct Abdomen Pelvis W Contrast  11/04/2012  **ADDENDUM** CREATED: 11/04/2012 20:08:14  This patient fell 1 week ago and is having left upper flank pain. Reviewing the bone windows the patient does have acute tenth, eleventh and twelfth rib fractures on the left, posterior laterally.  There also left-sided transverse process fractures of L1, L2, L3 and L4.  No acute injury involving the spleen or left kidney is identified.  No psoas hematoma.  **END ADDENDUM** SIGNED BY: Liliana Cline, M.D.   11/04/2012  *RADIOLOGY REPORT*  Clinical Data: The left upper quadrant abdominal pain.  CT ABDOMEN AND PELVIS WITH CONTRAST   Technique:  Multidetector CT imaging of the abdomen and pelvis was performed following the standard protocol during bolus administration of intravenous contrast.  Contrast: 110m OMNIPAQUE IOHEXOL 300 MG/ML  SOLN  Comparison: None.  Findings: The lung bases are clear.  No pleural effusion.  The liver is unremarkable.  No focal hepatic lesions or intrahepatic biliary dilatation.  The gallbladder is normal.  No common bile duct dilatation.  The pancreas is normal.  The spleen is normal.  The adrenal glands and kidneys are normal.  The stomach, duodenum and small bowel are unremarkable.  No inflammatory changes or mass lesions.  There is an area of mild focal uncomplicated diverticulitis involving the sigmoid colon descending colon junction region.  The remainder the colon is  unremarkable.  The appendix is normal.  The aorta is normal in caliber.  Moderate atherosclerotic calcifications.  The major branch vessels are patent.  No mesenteric or retroperitoneal mass or adenopathy.  Small scattered lymph nodes are noted.  The uterus and ovaries are unremarkable.  There is a small cyst associated the left ovary.  The bladder is normal.  No pelvic mass or adenopathy.  No inguinal mass or hernia.  The bony structures are unremarkable.  IMPRESSION:  Focal area of uncomplicated diverticulitis involving the upper sigmoid colon descending colon junction region.   Original Report Authenticated By: Marijo Sanes, M.D.      1. Left rib fracture, closed, initial encounter   2. Fall, initial encounter   3. Lumbar transverse process fracture, closed, initial encounter       MDM  47 yo F presents for 1 week of left flank and left upper quadrant pain at the left costal margin after a mechanical fall at home. Has had microscopic hematuria at both ED visits. Imaging consistent with left 10th-12th rib fractures and transverse process fractures of L1-L4. No evidence of injuries to spleen or left kidney. Patient with no hypoxia or  dyspnea and no evidence of associated pneumonia. Incentive spirometry and analgesia provided in ED with instructions for use of incentive spirometry. Will prescribe narcotics for pain control and encourage incentive spirometry use to prevent pneumonia. Patient given return precautions, including worsening of signs or symptoms. Patient instructed to follow-up with primary care physician.          Marco Collie, MD 11/04/12 2325

## 2012-11-04 NOTE — ED Notes (Signed)
Patient transported to CT 

## 2012-11-04 NOTE — ED Provider Notes (Signed)
Patient tripped and fell 8 days ago injuring her left ribs she complains of left posterior lateral lower rib pain and left upper quadrant pain since event.  Orlie Dakin, MD 11/04/12 1946

## 2012-11-04 NOTE — ED Provider Notes (Addendum)
History     CSN: [id number]  Arrival date & time 11/04/12  1249   First MD Initiated Contact with Patient 11/04/12 1252      Chief Complaint  Patient presents with  . Rib Injury    (Consider location/radiation/quality/duration/timing/severity/associated sxs/prior treatment) HPI Comments: Patient presents urgent care complaining of ongoing pain to the left flank region ( patient points to both left flank area and left subcostal region), pain is worse when she moves lays down to coughs or takes deep breaths. She hears a " popping sound when she takes a deep breath and suspects that she does have a fracture. Denies any fevers or urinary symptoms, no nausea or vomiting.  Patient is a 47 y.o. female presenting with flank pain. The history is provided by the patient.  Flank Pain This is a recurrent problem. The current episode started more than 1 week ago. The problem occurs constantly. The problem has been gradually worsening. Associated symptoms include abdominal pain. Pertinent negatives include no chest pain, no headaches and no shortness of breath. The symptoms are aggravated by walking, exertion, standing and coughing. The symptoms are relieved by walking and position.    Past Medical History  Diagnosis Date  . Emphysema   . COPD (chronic obstructive pulmonary disease)   . MS (multiple sclerosis)   . Hypertension   . Headache   . Anemia   . Infection     urinary tract infection  . Bone fracture     ankle  . Abnormal Pap smear     cryo    Past Surgical History  Procedure Laterality Date  . Tubal ligation    . Arm wound repair / closure    . Dilation and curettage of uterus      Family History  Problem Relation Age of Onset  . Other Neg Hx   . Diabetes Father   . Diabetes Sister   . Hypertension Sister   . Diabetes Brother   . Hypertension Brother     History  Substance Use Topics  . Smoking status: Current Every Day Smoker -- 1.00 packs/day for 35 years   Types: Cigarettes  . Smokeless tobacco: Not on file  . Alcohol Use: Yes     Comment: couple times a wk    OB History   Grav Para Term Preterm Abortions TAB SAB Ect Mult Living   7 2 1 1 5 2 3   2       Review of Systems  Respiratory: Negative for shortness of breath and stridor.   Cardiovascular: Negative for chest pain and palpitations.  Gastrointestinal: Positive for abdominal pain.  Genitourinary: Positive for hematuria and flank pain. Negative for dysuria and difficulty urinating.  Musculoskeletal: Negative for myalgias, back pain and joint swelling.  Skin: Negative for color change, pallor, rash and wound.  Neurological: Negative for headaches.    Allergies  Review of patient's allergies indicates no known allergies.  Home Medications   Current Outpatient Rx  Name  Route  Sig  Dispense  Refill  . diazepam (VALIUM) 5 MG tablet   Oral   Take 1 tablet (5 mg total) by mouth 2 (two) times daily.   10 tablet   0   . oxyCODONE-acetaminophen (PERCOCET/ROXICET) 5-325 MG per tablet   Oral   Take 1 tablet by mouth every 4 (four) hours as needed for pain.   20 tablet   0     BP 161/98  Pulse 108  Temp(Src) 98.2 F (36.8  C) (Oral)  Resp 20  SpO2 95%  LMP 06/20/2012  Physical Exam  Nursing note and vitals reviewed. Constitutional: Vital signs are normal. She appears well-developed and well-nourished.  Non-toxic appearance. She does not have a sickly appearance. She does not appear ill. No distress.  Pulmonary/Chest: She has no decreased breath sounds.    Abdominal: Soft. She exhibits no distension and no mass. There is tenderness. There is no rebound and no guarding.  Musculoskeletal: She exhibits tenderness.  Neurological: She is alert.  Skin: No rash noted. No erythema.    ED Course  Procedures (including critical care time)  Labs Reviewed  POCT URINALYSIS DIP (DEVICE) - Abnormal; Notable for the following:    Bilirubin Urine SMALL (*)    Hgb urine  dipstick MODERATE (*)    Protein, ur 100 (*)    All other components within normal limits   No results found.   No diagnosis found.  Non-diagnostic- focal handheld ultrasound performed- inconclusive  MDM  Persistent left flank/lateral chest wall pain status post fall > a week ago. Today's exam revealed moderate degree of tenderness on her left upper quadrant region and persistent hematuria. Patient will be transferred to the emergency department to be consider for a focal- USG of left peri-renal space/pouch, and rule out an associated intra-abdominal injury.        Rosana Hoes, MD 11/04/12 [place], MD 11/04/12 1352

## 2012-11-05 ENCOUNTER — Encounter: Payer: Self-pay | Admitting: Emergency Medicine

## 2012-11-05 ENCOUNTER — Encounter: Payer: Self-pay | Admitting: *Deleted

## 2012-11-05 ENCOUNTER — Telehealth (HOSPITAL_COMMUNITY): Payer: Self-pay | Admitting: *Deleted

## 2012-11-05 ENCOUNTER — Telehealth (HOSPITAL_COMMUNITY): Payer: Self-pay | Admitting: Emergency Medicine

## 2012-11-05 NOTE — Telephone Encounter (Signed)
Telephoned patient at home # and left message to return call to [facility]. Patient needs reschedule pap smear.

## 2012-11-05 NOTE — Telephone Encounter (Signed)
Patient returned called. Patient states unable to follow up with pap smear at this time. Has rib fracture and lumbar fracture. Patient states will call back in June for appointment.

## 2012-11-05 NOTE — ED Provider Notes (Signed)
I have personally seen and examined the patient.  I have discussed the plan of care with the resident.  I have reviewed the documentation on PMH/FH/Soc. History.  I have reviewed the documentation of the resident and agree.  Orlie Dakin, MD 11/05/12 0100

## 2012-12-03 ENCOUNTER — Emergency Department (HOSPITAL_COMMUNITY): Payer: Self-pay

## 2012-12-03 ENCOUNTER — Encounter (HOSPITAL_COMMUNITY): Payer: Self-pay | Admitting: Emergency Medicine

## 2012-12-03 ENCOUNTER — Emergency Department (HOSPITAL_COMMUNITY)
Admission: EM | Admit: 2012-12-03 | Discharge: 2012-12-03 | Disposition: A | Discharge status: Home / Self Care | Payer: Self-pay | Attending: Emergency Medicine | Admitting: Emergency Medicine

## 2012-12-03 DIAGNOSIS — J4489 Other specified chronic obstructive pulmonary disease: Secondary | ICD-10-CM | POA: Insufficient documentation

## 2012-12-03 DIAGNOSIS — F17200 Nicotine dependence, unspecified, uncomplicated: Secondary | ICD-10-CM | POA: Insufficient documentation

## 2012-12-03 DIAGNOSIS — G8911 Acute pain due to trauma: Secondary | ICD-10-CM | POA: Insufficient documentation

## 2012-12-03 DIAGNOSIS — S2232XS Fracture of one rib, left side, sequela: Secondary | ICD-10-CM

## 2012-12-03 DIAGNOSIS — J449 Chronic obstructive pulmonary disease, unspecified: Secondary | ICD-10-CM | POA: Insufficient documentation

## 2012-12-03 DIAGNOSIS — Z862 Personal history of diseases of the blood and blood-forming organs and certain disorders involving the immune mechanism: Secondary | ICD-10-CM | POA: Insufficient documentation

## 2012-12-03 DIAGNOSIS — Z87440 Personal history of urinary (tract) infections: Secondary | ICD-10-CM | POA: Insufficient documentation

## 2012-12-03 DIAGNOSIS — M549 Dorsalgia, unspecified: Secondary | ICD-10-CM | POA: Insufficient documentation

## 2012-12-03 DIAGNOSIS — I10 Essential (primary) hypertension: Secondary | ICD-10-CM | POA: Insufficient documentation

## 2012-12-03 DIAGNOSIS — Z8669 Personal history of other diseases of the nervous system and sense organs: Secondary | ICD-10-CM | POA: Insufficient documentation

## 2012-12-03 MED ORDER — HYDROCODONE-ACETAMINOPHEN 5-325 MG PO TABS: 1.0000 | ORAL_TABLET | ORAL | Status: DC | PRN

## 2012-12-03 MED ORDER — IBUPROFEN 800 MG PO TABS
800.0000 mg | ORAL_TABLET | Freq: Once | ORAL | Status: AC
  Administered 2012-12-03: 800 mg via ORAL
  Filled 2012-12-03: qty 1

## 2012-12-03 NOTE — ED Notes (Signed)
Pt sts seen here after fall and told she had broken ribs and fractures in back; pt sts still having some pain in left rib area and around to back and here for follow up; pt denies new injury and sts some improvement but sts still having some pain

## 2012-12-03 NOTE — ED Provider Notes (Signed)
History     CSN: [id number]  Arrival date & time 12/03/12  1006   First MD Initiated Contact with Patient 12/03/12 1034      Chief Complaint  Patient presents with  . Follow-up    (Consider location/radiation/quality/duration/timing/severity/associated sxs/prior treatment) HPI  Pt is a 47 yo F with recent rib fractures one month ago presenting for follow up for continued stiffness over injured ribs. Pt states the pain is much better from one month ago, but was worried because she was having stiffness. Pt states she was told that it would take several weeks for her ribs to heal. Pt has no new complaints or worsening rib pain. Pt denies any CP, SOB, injury to the area, abdominal pain.   Past Medical History  Diagnosis Date  . Emphysema   . COPD (chronic obstructive pulmonary disease)   . MS (multiple sclerosis)   . Hypertension   . Headache   . Anemia   . Infection     urinary tract infection  . Bone fracture     ankle  . Abnormal Pap smear     cryo    Past Surgical History  Procedure Laterality Date  . Tubal ligation    . Arm wound repair / closure    . Dilation and curettage of uterus      Family History  Problem Relation Age of Onset  . Other Neg Hx   . Diabetes Father   . Diabetes Sister   . Hypertension Sister   . Diabetes Brother   . Hypertension Brother     History  Substance Use Topics  . Smoking status: Current Every Day Smoker -- 1.00 packs/day for 35 years    Types: Cigarettes  . Smokeless tobacco: Not on file  . Alcohol Use: Yes     Comment: couple times a wk    OB History   Grav Para Term Preterm Abortions TAB SAB Ect Mult Living   7 2 1 1 5 2 3   2       Review of Systems  Constitutional: Negative for fever and chills.  Respiratory: Negative for cough, chest tightness and shortness of breath.   Cardiovascular: Negative for chest pain and palpitations.  Musculoskeletal: Positive for back pain.  All other systems reviewed and are  negative.    Allergies  Review of patient's allergies indicates no known allergies.  Home Medications   Current Outpatient Rx  Name  Route  Sig  Dispense  Refill  . ibuprofen (ADVIL,MOTRIN) 200 MG tablet   Oral   Take 600 mg by mouth every 6 (six) hours as needed for pain.           BP 147/104  Pulse 99  Temp(Src) 98.1 F (36.7 C) (Oral)  Resp 18  SpO2 95%  LMP 06/20/2012  Physical Exam  Constitutional: She is oriented to person, place, and time. She appears well-developed and well-nourished.  HENT:  Head: Normocephalic and atraumatic.  Eyes: EOM are normal. Pupils are equal, round, and reactive to light.  Cardiovascular: Normal rate, regular rhythm and normal heart sounds.   Pulmonary/Chest: Effort normal and breath sounds normal. No accessory muscle usage. Not tachypneic. No respiratory distress.    No flail chest No asymmetric expansion.  Mildly tender w/ palpation to L 10, 11, and 12th rib region.   Abdominal: Soft. Bowel sounds are normal. There is no tenderness.  Neurological: She is alert and oriented to person, place, and time.  Skin: Skin is warm  and dry.  Psychiatric: She has a normal mood and affect.    ED Course  Procedures (including critical care time)  Labs Reviewed - No data to display Dg Chest 2 View  12/03/2012   *RADIOLOGY REPORT*  Clinical Data: Shortness of breath  CHEST - 2 VIEW  Comparison: 10/28/2012  Findings: Heart size is normal.  No pleural effusion or edema.  There is no airspace consolidation identified.  There are coarsened interstitial markings noted bilaterally.  The visualized osseous structures are unremarkable.  IMPRESSION:  1.  No acute cardiopulmonary abnormalities.   Original Report Authenticated By: Kerby Moors, M.D.     1. Rib fracture, left, sequela       MDM  Pt presenting for sequela visit regarding rib fracture s/p one month ago. Pt has no new changing or worsening pain. Some mild tenderness to palpation over  left lower rib region. No asymmetric chest expansion. No abdominal pain. Imaging shows no fracture or other acute cardiopulmonary etiologies. Directed pt to ice injury, take pain medications, and to  rest the injury when possible. Again advised rib fractures can take up to 5-6wks to heal and feel fully better. Patient agreeable to plan. Patient d/w with Dr. Kathrynn Humble, agrees with plan. Patient is stable at time of discharge.          Harlow Mares, PA-C 12/03/12 1557  Medical screening examination/treatment/procedure(s) were performed by non-physician practitioner and as supervising physician I was immediately available for consultation/collaboration.  Varney Biles, MD 12/05/12 1650

## 2012-12-04 ENCOUNTER — Encounter (HOSPITAL_COMMUNITY): Payer: Self-pay

## 2012-12-04 ENCOUNTER — Ambulatory Visit (HOSPITAL_COMMUNITY)
Admission: RE | Admit: 2012-12-04 | Discharge: 2012-12-04 | Disposition: A | Discharge status: Home / Self Care | Payer: Self-pay | Source: Ambulatory Visit | Attending: Obstetrics and Gynecology | Admitting: Obstetrics and Gynecology

## 2012-12-04 VITALS — BP 142/86 | Temp 97.6°F | Ht 64.0 in | Wt 122.6 lb

## 2012-12-04 DIAGNOSIS — Z01419 Encounter for gynecological examination (general) (routine) without abnormal findings: Secondary | ICD-10-CM

## 2012-12-04 NOTE — Progress Notes (Signed)
Complaints of left ovary pain.  Pap Smear:    Completed Pap smear today. Last Pap smear was 05/23/2011 at the [facility] and normal. Per patient she has had frequent abnormal Pap smears. Per patient had cryo over 5 years ago for an abnormal Pap smear. Pap smear result above is in EPIC.         Pelvic/Bimanual   Ext Genitalia No lesions, no swelling and no discharge observed on external genitalia.         Vagina Vagina pink and normal texture. No lesions or discharge observed in vagina.          Cervix Cervix is present. Cervix pink and slightly red around os. Cervix of normal texture. No discharge observed on cervix.      Uterus Uterus is present and palpable. Uterus in normal position and normal size.       Adnexae Bilateral ovaries present and palpable. No tenderness on palpation right ovary. Left ovary tender and enlarged. Patient is being followed by the [facility]. Told patient to call and schedule a follow up appointment at the Clinics.       Rectovaginal No rectal exam completed today since patient had no rectal complaints. No skin abnormalities observed on rectal area.

## 2012-12-04 NOTE — Patient Instructions (Signed)
Let her know next Pap smear will be due based on today's result. Discussed smoking cessation with patient and gave her resources of free classes offered by the [facility]. Let patient know will follow up with her within the next couple weeks with results by phone. Patient verbalized understanding.

## 2012-12-24 ENCOUNTER — Telehealth (HOSPITAL_COMMUNITY): Payer: Self-pay | Admitting: *Deleted

## 2012-12-24 NOTE — Telephone Encounter (Signed)
Patient returned call. Advised patient of abnormal pap smear and would need colposcopy. Advised patient was scheduled at [facility] Thursday July 2 12:45. Patient voiced understanding.

## 2012-12-24 NOTE — Telephone Encounter (Signed)
Telephoned patient at home # and left message to return call to [facility] due to abnormal pap smear. Patient has appointment at [facility] outpatient clinic for [facility] Thursday July 3 12:45

## 2013-01-17 ENCOUNTER — Encounter: Payer: Self-pay | Admitting: Family Medicine

## 2013-01-24 ENCOUNTER — Telehealth (HOSPITAL_COMMUNITY): Payer: Self-pay | Admitting: *Deleted

## 2013-01-24 NOTE — Telephone Encounter (Signed)
Patient returned call and advised would need to call [facility] and reschedule Colpo. Patient voiced understanding.

## 2013-01-24 NOTE — Telephone Encounter (Signed)
Telephoned patient at home # and left message to return call to [facility] 

## 2013-03-06 ENCOUNTER — Encounter: Payer: Self-pay | Admitting: Obstetrics & Gynecology

## 2013-04-02 ENCOUNTER — Emergency Department (HOSPITAL_COMMUNITY): Payer: Self-pay

## 2013-04-02 ENCOUNTER — Encounter (HOSPITAL_COMMUNITY): Payer: Self-pay | Admitting: Emergency Medicine

## 2013-04-02 ENCOUNTER — Emergency Department (HOSPITAL_COMMUNITY)
Admission: EM | Admit: 2013-04-02 | Discharge: 2013-04-02 | Disposition: A | Discharge status: Home / Self Care | Payer: Self-pay | Attending: Emergency Medicine | Admitting: Emergency Medicine

## 2013-04-02 DIAGNOSIS — Y929 Unspecified place or not applicable: Secondary | ICD-10-CM | POA: Insufficient documentation

## 2013-04-02 DIAGNOSIS — Z8669 Personal history of other diseases of the nervous system and sense organs: Secondary | ICD-10-CM | POA: Insufficient documentation

## 2013-04-02 DIAGNOSIS — I10 Essential (primary) hypertension: Secondary | ICD-10-CM | POA: Insufficient documentation

## 2013-04-02 DIAGNOSIS — Z862 Personal history of diseases of the blood and blood-forming organs and certain disorders involving the immune mechanism: Secondary | ICD-10-CM | POA: Insufficient documentation

## 2013-04-02 DIAGNOSIS — Z8639 Personal history of other endocrine, nutritional and metabolic disease: Secondary | ICD-10-CM | POA: Insufficient documentation

## 2013-04-02 DIAGNOSIS — L03119 Cellulitis of unspecified part of limb: Secondary | ICD-10-CM

## 2013-04-02 DIAGNOSIS — L089 Local infection of the skin and subcutaneous tissue, unspecified: Secondary | ICD-10-CM | POA: Insufficient documentation

## 2013-04-02 DIAGNOSIS — Y939 Activity, unspecified: Secondary | ICD-10-CM | POA: Insufficient documentation

## 2013-04-02 DIAGNOSIS — IMO0002 Reserved for concepts with insufficient information to code with codable children: Secondary | ICD-10-CM | POA: Insufficient documentation

## 2013-04-02 DIAGNOSIS — F17200 Nicotine dependence, unspecified, uncomplicated: Secondary | ICD-10-CM | POA: Insufficient documentation

## 2013-04-02 DIAGNOSIS — J438 Other emphysema: Secondary | ICD-10-CM | POA: Insufficient documentation

## 2013-04-02 DIAGNOSIS — Z8781 Personal history of (healed) traumatic fracture: Secondary | ICD-10-CM | POA: Insufficient documentation

## 2013-04-02 DIAGNOSIS — M009 Pyogenic arthritis, unspecified: Secondary | ICD-10-CM

## 2013-04-02 DIAGNOSIS — Z87440 Personal history of urinary (tract) infections: Secondary | ICD-10-CM | POA: Insufficient documentation

## 2013-04-02 LAB — SYNOVIAL CELL COUNT + DIFF, W/ CRYSTALS
Crystals, Fluid: NONE SEEN
Monocyte-Macrophage-Synovial Fluid: 0 % — ABNORMAL LOW (ref 50–90)
WBC, Synovial: 2660 /mm3 — ABNORMAL HIGH (ref 0–200)

## 2013-04-02 MED ORDER — CLINDAMYCIN HCL 300 MG PO CAPS: 300.0000 mg | ORAL_CAPSULE | Freq: Four times a day (QID) | ORAL | Status: DC

## 2013-04-02 MED ORDER — HYDROCODONE-ACETAMINOPHEN 5-325 MG PO TABS: 1.0000 | ORAL_TABLET | Freq: Four times a day (QID) | ORAL | Status: DC | PRN

## 2013-04-02 MED ORDER — CEPHALEXIN 500 MG PO CAPS: 500.0000 mg | ORAL_CAPSULE | Freq: Four times a day (QID) | ORAL | Status: DC

## 2013-04-02 MED ORDER — OXYCODONE-ACETAMINOPHEN 5-325 MG PO TABS
2.0000 | ORAL_TABLET | Freq: Once | ORAL | Status: AC
  Administered 2013-04-02: 2 via ORAL
  Filled 2013-04-02: qty 2

## 2013-04-02 NOTE — ED Provider Notes (Addendum)
CSN: [id number]     Arrival date & time 04/02/13  1912 History  This chart was scribed for non-physician practitioner Michele Mcalpine, PA, working with Nat Christen, MD, by [facility] ED Scribe. This patient was seen in room TR08C/TR08C and the patient's care was started at 7:49PM.    Chief Complaint  Patient presents with  . Insect Bite    The history is provided by the patient. No language interpreter was used.   HPI Comments: Christina Miller is a 47 y.o. female who presents to the Emergency Department complaining of a suspected insect bite to her left elbow with associated pain, swelling and redness over the past 5 days. Pt states swelling has currently decreased from and that it was recently an area of swelling the size of a golf ball. She denies associated drainage from the area. She states she has taken no medications for her pain. Pt denies chills, fever or any other symptoms. She denies personal history of gout, but reports a family history of gout.    Past Medical History  Diagnosis Date  . Emphysema   . COPD (chronic obstructive pulmonary disease)   . MS (multiple sclerosis)   . Hypertension   . Headache(784.0)   . Anemia   . Infection     urinary tract infection  . Bone fracture     ankle  . Abnormal Pap smear     cryo   Past Surgical History  Procedure Laterality Date  . Tubal ligation    . Arm wound repair / closure    . Dilation and curettage of uterus     Family History  Problem Relation Age of Onset  . Other Neg Hx   . Diabetes Father   . Diabetes Sister   . Hypertension Sister   . Diabetes Brother   . Hypertension Brother    History  Substance Use Topics  . Smoking status: Current Every Day Smoker -- 1.00 packs/day for 35 years    Types: Cigarettes  . Smokeless tobacco: Not on file  . Alcohol Use: Yes     Comment: couple times a wk   OB History   Grav Para Term Preterm Abortions TAB SAB Ect Mult Living   7 2 1 1 5 2 3   2      Review of Systems   Constitutional: Negative for fever and chills.  Skin: Positive for wound (insect bite).  All other systems reviewed and are negative.   Allergies  Review of patient's allergies indicates no known allergies.  Home Medications   Current Outpatient Rx  Name  Route  Sig  Dispense  Refill  . ibuprofen (ADVIL,MOTRIN) 200 MG tablet   Oral   Take 600 mg by mouth every 6 (six) hours as needed for pain.          Triage Vitals: BP 127/94  Pulse 103  Temp(Src) 98.6 F (37 C) (Oral)  Resp 14  SpO2 98%  LMP 06/20/2012  Physical Exam  Nursing note and vitals reviewed. Constitutional: She is oriented to person, place, and time. She appears well-developed and well-nourished. No distress.  HENT:  Head: Normocephalic and atraumatic.  Mouth/Throat: Oropharynx is clear and moist.  Eyes: Conjunctivae and EOM are normal.  Neck: Normal range of motion. Neck supple.  Cardiovascular: Normal rate, regular rhythm, normal heart sounds and intact distal pulses.   Pulmonary/Chest: Effort normal and breath sounds normal. No respiratory distress.  Musculoskeletal: Normal range of motion. She exhibits no edema.  Neurological: She is alert and oriented to person, place, and time. No sensory deficit.  Skin: Skin is warm and dry.  4 cm area of erythema around left elbow with warmth, tenderness and fluctuance. Full ROM of elbow.  Psychiatric: She has a normal mood and affect. Her behavior is normal.    ED Course  ARTHOCENTESIS Date/Time: 04/02/2013 8:30 PM Performed by: Illene Labrador Authorized by: Illene Labrador Consent: Verbal consent obtained. Risks and benefits: risks, benefits and alternatives were discussed Consent given by: patient Patient understanding: patient states understanding of the procedure being performed Patient identity confirmed: verbally with patient Indications: joint swelling and pain  Body area: elbow Local anesthesia used: yes Anesthesia: local infiltration Local  anesthetic: lidocaine 2% with epinephrine Anesthetic total: 2 ml Patient sedated: no Needle gauge: 18 G Approach: lateral Aspirate: blood-tinged Aspirate amount: 1 ml   (including critical care time)  DIAGNOSTIC STUDIES: Oxygen Saturation is 98% on RA, normal by my interpretation.    COORDINATION OF CARE: 8:15 PM- Pt advised of plan for draining the area. Also discussed plan for pt to receive antibiotics. Pt agrees to plan.   Labs Review Labs Reviewed  SYNOVIAL CELL COUNT + DIFF, W/ CRYSTALS - Abnormal; Notable for the following:    Color, Synovial RED (*)    Appearance-Synovial CLOUDY (*)    WBC, Synovial 2660 (*)    Neutrophil, Synovial 98 (*)    Monocyte-Macrophage-Synovial Fluid 0 (*)    All other components within normal limits   Imaging Review Dg Elbow Complete Left  04/02/2013   CLINICAL DATA:  Insect bite  EXAM: LEFT ELBOW - COMPLETE 3+ VIEW  COMPARISON:  None.  FINDINGS: There is no evidence of fracture, dislocation, or joint effusion. There is no evidence of inflammatory arthropathy or other focal bone abnormality. There is mild subcutaneous fat infiltration about the medial elbow.  IMPRESSION: No acute or significant osseous findings. No effusion.   Electronically Signed   By: Jorje Guild   On: 04/02/2013 22:41    MDM   1. Cellulitis of elbow   2. Joint infection    Patient with erythema, warmth and swelling to her left elbow. She has full range of motion of her left elbow without pain. I aspirated fluid from her joint space which did not show crystals, however white blood cells seen. X-ray of elbow obtained for further evaluation, concern for infected joint, however patient states her husband has to be at work at 3:00 in the morning and would like to leave before any further results are obtained or any further measures are taken. She will be discharged with clindamycin and Keflex, Vicodin for pain. Followup with orthopedics. Close return precautions discussed.  Patient states understanding of plan and is agreeable.  I personally performed the services described in this documentation, which was scribed in my presence. The recorded information has been reviewed and is accurate.     Illene Labrador, PA-C 04/02/13 2210  11:37 PM I reviewed xray that resulted after patient left and no acute abnormalities seen, no effusion.  Illene Labrador, PA-C 04/02/13 2338

## 2013-04-02 NOTE — ED Notes (Signed)
PT comfortable with d/c and f/u instructions. Prescriptions x3

## 2013-04-02 NOTE — ED Notes (Signed)
Pt. reports insect bite at left elbow last Thursday states progressing reddness / swelling , no drainage.

## 2013-04-02 NOTE — ED Notes (Signed)
Contacted radiology regarding delay in results, radiology unable to provide timeframe at this time. PA and pt updated

## 2013-04-02 NOTE — ED Notes (Signed)
Pt requesting to be discharged prior to xray results. EDPA made aware, states okay for d/c, will print paperwork. Pt agreeable to f/u with Orthopedist.

## 2013-04-02 NOTE — ED Notes (Signed)
Pt returned from X-ray.  

## 2013-04-02 NOTE — ED Provider Notes (Signed)
Medical screening examination/treatment/procedure(s) were performed by non-physician practitioner and as supervising physician I was immediately available for consultation/collaboration.  Nat Christen, MD 04/02/13 2231

## 2013-04-03 LAB — PATHOLOGIST SMEAR REVIEW

## 2013-04-03 NOTE — Progress Notes (Signed)
EDCM spoke to patient regarding medication assitance.  As per patient, she is unable to afford Ceftin antibiotic but is able to afford Keflex.  [facility] called [facility] and confirmed that patient's Ceftin prescriptoin is seventy-one dollars.  Patient does not have insurance and has never been in the [facility] program before.  EDCM explained to patient that her prescriotion will only cost her three dollars with the [facility] program.  Patient agreeable to this amount.  Patient confirms that she uses the pharmacy at [address] in [place].  EDCM called this pharmacy to confirm that patient will be using the [facility] program for her RX for Ceftin and that a Match letter will be faxed over to them.  EDCM confirmed the fax number for this pharmacy is [phone].  EDCM explained to patient that she only has seven days to fill this prescription and that she will not be eligible for this program until this time next year.  Patient replied, "I'm getting it filled tonite.  Patient thankful for assistance.  This EDCM unable to process MATCH request through PDMI.  Above information given to [facility] to process.  This EDCM called patient back to inform her of the delay but that she will still be getting MATCH assist.  Patient again very thankful for assistance.  No further needs at this time.

## 2013-04-03 NOTE — ED Provider Notes (Signed)
Medical screening examination/treatment/procedure(s) were performed by non-physician practitioner and as supervising physician I was immediately available for consultation/collaboration.  Nat Christen, MD 04/03/13 [phone]

## 2013-04-22 ENCOUNTER — Encounter: Payer: Self-pay | Admitting: Obstetrics & Gynecology

## 2013-04-22 ENCOUNTER — Ambulatory Visit (INDEPENDENT_AMBULATORY_CARE_PROVIDER_SITE_OTHER): Payer: Self-pay | Admitting: Obstetrics & Gynecology

## 2013-04-22 ENCOUNTER — Other Ambulatory Visit (HOSPITAL_COMMUNITY)
Admission: RE | Admit: 2013-04-22 | Discharge: 2013-04-22 | Disposition: A | Discharge status: Home / Self Care | Payer: Self-pay | Source: Ambulatory Visit | Attending: Obstetrics & Gynecology | Admitting: Obstetrics & Gynecology

## 2013-04-22 VITALS — BP 136/91 | HR 110 | Temp 97.7°F | Ht 61.0 in | Wt 118.0 lb

## 2013-04-22 DIAGNOSIS — R87611 Atypical squamous cells cannot exclude high grade squamous intraepithelial lesion on cytologic smear of cervix (ASC-H): Secondary | ICD-10-CM | POA: Insufficient documentation

## 2013-04-22 NOTE — Patient Instructions (Signed)
Smoking Cessation Quitting smoking is important to your health and has many advantages. However, it is not always easy to quit since nicotine is a very addictive drug. Often times, people try 3 times or more before being able to quit. This document explains the best ways for you to prepare to quit smoking. Quitting takes hard work and a lot of effort, but you can do it. ADVANTAGES OF QUITTING SMOKING  You will live longer, feel better, and live better.  Your body will feel the impact of quitting smoking almost immediately.  Within 20 minutes, blood pressure decreases. Your pulse returns to its normal level.  After 8 hours, carbon monoxide levels in the blood return to normal. Your oxygen level increases.  After 24 hours, the chance of having a heart attack starts to decrease. Your breath, hair, and body stop smelling like smoke.  After 48 hours, damaged nerve endings begin to recover. Your sense of taste and smell improve.  After 72 hours, the body is virtually free of nicotine. Your bronchial tubes relax and breathing becomes easier.  After 2 to 12 weeks, lungs can hold more air. Exercise becomes easier and circulation improves.  The risk of having a heart attack, stroke, cancer, or lung disease is greatly reduced.  After 1 year, the risk of coronary heart disease is cut in half.  After 5 years, the risk of stroke falls to the same as a nonsmoker.  After 10 years, the risk of lung cancer is cut in half and the risk of other cancers decreases significantly.  After 15 years, the risk of coronary heart disease drops, usually to the level of a nonsmoker.  If you are pregnant, quitting smoking will improve your chances of having a healthy baby.  The people you live with, especially any children, will be healthier.  You will have extra money to spend on things other than cigarettes. QUESTIONS TO THINK ABOUT BEFORE ATTEMPTING TO QUIT You may want to talk about your answers with your  caregiver.  Why do you want to quit?  If you tried to quit in the past, what helped and what did not?  What will be the most difficult situations for you after you quit? How will you plan to handle them?  Who can help you through the tough times? Your family? Friends? A caregiver?  What pleasures do you get from smoking? What ways can you still get pleasure if you quit? Here are some questions to ask your caregiver:  How can you help me to be successful at quitting?  What medicine do you think would be best for me and how should I take it?  What should I do if I need more help?  What is smoking withdrawal like? How can I get information on withdrawal? GET READY  Set a quit date.  Change your environment by getting rid of all cigarettes, ashtrays, matches, and lighters in your home, car, or work. Do not let people smoke in your home.  Review your past attempts to quit. Think about what worked and what did not. GET SUPPORT AND ENCOURAGEMENT You have a better chance of being successful if you have help. You can get support in many ways.  Tell your family, friends, and co-workers that you are going to quit and need their support. Ask them not to smoke around you.  Get individual, group, or telephone counseling and support. Programs are available at local hospitals and health centers. Call your local health department for   information about programs in your area.  Spiritual beliefs and practices may help some smokers quit.  Download a "quit meter" on your computer to keep track of quit statistics, such as how long you have gone without smoking, cigarettes not smoked, and money saved.  Get a self-help book about quitting smoking and staying off of tobacco. LEARN NEW SKILLS AND BEHAVIORS  Distract yourself from urges to smoke. Talk to someone, go for a walk, or occupy your time with a task.  Change your normal routine. Take a different route to work. Drink tea instead of coffee.  Eat breakfast in a different place.  Reduce your stress. Take a hot bath, exercise, or read a book.  Plan something enjoyable to do every day. Reward yourself for not smoking.  Explore interactive web-based programs that specialize in helping you quit. GET MEDICINE AND USE IT CORRECTLY Medicines can help you stop smoking and decrease the urge to smoke. Combining medicine with the above behavioral methods and support can greatly increase your chances of successfully quitting smoking.  Nicotine replacement therapy helps deliver nicotine to your body without the negative effects and risks of smoking. Nicotine replacement therapy includes nicotine gum, lozenges, inhalers, nasal sprays, and skin patches. Some may be available over-the-counter and others require a prescription.  Antidepressant medicine helps people abstain from smoking, but how this works is unknown. This medicine is available by prescription.  Nicotinic receptor partial agonist medicine simulates the effect of nicotine in your brain. This medicine is available by prescription. Ask your caregiver for advice about which medicines to use and how to use them based on your health history. Your caregiver will tell you what side effects to look out for if you choose to be on a medicine or therapy. Carefully read the information on the package. Do not use any other product containing nicotine while using a nicotine replacement product.  RELAPSE OR DIFFICULT SITUATIONS Most relapses occur within the first 3 months after quitting. Do not be discouraged if you start smoking again. Remember, most people try several times before finally quitting. You may have symptoms of withdrawal because your body is used to nicotine. You may crave cigarettes, be irritable, feel very hungry, cough often, get headaches, or have difficulty concentrating. The withdrawal symptoms are only temporary. They are strongest when you first quit, but they will go away within  10 14 days. To reduce the chances of relapse, try to:  Avoid drinking alcohol. Drinking lowers your chances of successfully quitting.  Reduce the amount of caffeine you consume. Once you quit smoking, the amount of caffeine in your body increases and can give you symptoms, such as a rapid heartbeat, sweating, and anxiety.  Avoid smokers because they can make you want to smoke.  Do not let weight gain distract you. Many smokers will gain weight when they quit, usually less than 10 pounds. Eat a healthy diet and stay active. You can always lose the weight gained after you quit.  Find ways to improve your mood other than smoking. FOR MORE INFORMATION  www.smokefree.gov  Document Released: 06/28/2001 Document Revised: 01/03/2012 Document Reviewed: 10/13/2011 [employer] Patient Information 2014 [employer].  

## 2013-04-22 NOTE — Progress Notes (Signed)
  Subjective:    Patient ID: Christina Miller, female    DOB: [birth year], 47 y.o.   MRN: [id number]  HPI  She is here for a colposcopy. She had a pap with [facility] and it showed ASCUS, cannot rule out HGSIL. She had a cryo 27 years ago. She is a smoker.  Review of Systems     Objective:   Physical Exam  UPT negative, consent signed, time out done Cervix prepped with acetic acid. Transformation zone seen in its entirety. Colpo adequate. 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 She tolerated the procedure well.       Assessment & Plan:  Abnormal pap- await cervical biopsy and ECC I have recommended that she stop smoking. RTC 2 weeks for results.

## 2013-04-26 ENCOUNTER — Telehealth: Payer: Self-pay | Admitting: *Deleted

## 2013-04-26 NOTE — Telephone Encounter (Signed)
Christina Miller called and states she is calling for results- states they told her  A week , but wondered if results were back. - per chart had colposcopy- see results

## 2013-05-01 NOTE — Telephone Encounter (Signed)
Patient informed of the need for leep procedure. Patient was very worried and wanted to speak to the doctor. I advised that she keep her follow up appt  Next week to discuss her results and watch leep video. Patient agrees.

## 2013-05-08 ENCOUNTER — Ambulatory Visit: Payer: Self-pay | Admitting: Obstetrics & Gynecology

## 2013-05-08 ENCOUNTER — Encounter: Payer: Self-pay | Admitting: *Deleted

## 2013-05-10 ENCOUNTER — Other Ambulatory Visit: Payer: Self-pay | Admitting: Obstetrics and Gynecology

## 2013-05-10 DIAGNOSIS — Z1231 Encounter for screening mammogram for malignant neoplasm of breast: Secondary | ICD-10-CM

## 2013-06-05 ENCOUNTER — Encounter: Payer: Self-pay | Admitting: Obstetrics & Gynecology

## 2013-06-25 ENCOUNTER — Ambulatory Visit (HOSPITAL_COMMUNITY): Payer: Self-pay

## 2013-06-25 ENCOUNTER — Ambulatory Visit (HOSPITAL_COMMUNITY): Payer: Self-pay | Attending: Obstetrics and Gynecology

## 2013-06-29 ENCOUNTER — Emergency Department (HOSPITAL_COMMUNITY)
Admission: EM | Admit: 2013-06-29 | Discharge: 2013-06-30 | Disposition: A | Discharge status: Home / Self Care | Payer: No Typology Code available for payment source | Attending: Emergency Medicine | Admitting: Emergency Medicine

## 2013-06-29 ENCOUNTER — Encounter (HOSPITAL_COMMUNITY): Payer: Self-pay | Admitting: Emergency Medicine

## 2013-06-29 DIAGNOSIS — F10929 Alcohol use, unspecified with intoxication, unspecified: Secondary | ICD-10-CM

## 2013-06-29 DIAGNOSIS — F1010 Alcohol abuse, uncomplicated: Secondary | ICD-10-CM | POA: Insufficient documentation

## 2013-06-29 DIAGNOSIS — R4789 Other speech disturbances: Secondary | ICD-10-CM | POA: Insufficient documentation

## 2013-06-29 DIAGNOSIS — J449 Chronic obstructive pulmonary disease, unspecified: Secondary | ICD-10-CM | POA: Insufficient documentation

## 2013-06-29 DIAGNOSIS — G35 Multiple sclerosis: Secondary | ICD-10-CM | POA: Insufficient documentation

## 2013-06-29 DIAGNOSIS — Z8669 Personal history of other diseases of the nervous system and sense organs: Secondary | ICD-10-CM | POA: Insufficient documentation

## 2013-06-29 DIAGNOSIS — Z8739 Personal history of other diseases of the musculoskeletal system and connective tissue: Secondary | ICD-10-CM | POA: Insufficient documentation

## 2013-06-29 DIAGNOSIS — I10 Essential (primary) hypertension: Secondary | ICD-10-CM | POA: Insufficient documentation

## 2013-06-29 DIAGNOSIS — Z8742 Personal history of other diseases of the female genital tract: Secondary | ICD-10-CM | POA: Insufficient documentation

## 2013-06-29 DIAGNOSIS — D649 Anemia, unspecified: Secondary | ICD-10-CM | POA: Insufficient documentation

## 2013-06-29 DIAGNOSIS — J4489 Other specified chronic obstructive pulmonary disease: Secondary | ICD-10-CM | POA: Insufficient documentation

## 2013-06-29 DIAGNOSIS — J438 Other emphysema: Secondary | ICD-10-CM | POA: Insufficient documentation

## 2013-06-29 LAB — CBC
Hemoglobin: 13.9 g/dL (ref 12.0–15.0)
MCH: 33.6 pg (ref 26.0–34.0)
MCHC: 34 g/dL (ref 30.0–36.0)
MCV: 98.8 fL (ref 78.0–100.0)
Platelets: 221 10*3/uL (ref 150–400)
RDW: 14 % (ref 11.5–15.5)

## 2013-06-29 LAB — COMPREHENSIVE METABOLIC PANEL
ALT: 11 U/L (ref 0–35)
AST: 25 U/L (ref 0–37)
Alkaline Phosphatase: 54 U/L (ref 39–117)
Calcium: 8.5 mg/dL (ref 8.4–10.5)
Creatinine, Ser: 0.64 mg/dL (ref 0.50–1.10)
GFR calc Af Amer: >90 mL/min (ref >90)
Glucose, Bld: 99 mg/dL (ref 70–99)
Potassium: 3.6 mEq/L (ref 3.5–5.1)
Sodium: 146 mEq/L — ABNORMAL HIGH (ref 135–145)
Total Protein: 7.5 g/dL (ref 6.0–8.3)

## 2013-06-29 LAB — RAPID URINE DRUG SCREEN, HOSP PERFORMED
Amphetamines: NOT DETECTED
Barbiturates: NOT DETECTED
Cocaine: NOT DETECTED
Opiates: NOT DETECTED
Tetrahydrocannabinol: NOT DETECTED

## 2013-06-29 MED ORDER — SODIUM CHLORIDE 0.9 % IV BOLUS (SEPSIS)
2000.0000 mL | Freq: Once | INTRAVENOUS | Status: AC
  Administered 2013-06-29: 2000 mL via INTRAVENOUS

## 2013-06-29 MED ORDER — NICOTINE 21 MG/24HR TD PT24
21.0000 mg | MEDICATED_PATCH | Freq: Every day | TRANSDERMAL | Status: DC
  Administered 2013-06-29: 21 mg via TRANSDERMAL
  Filled 2013-06-29: qty 1
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
## 2013-06-29 NOTE — ED Notes (Signed)
Critical value (Alcohol 470) reported to [place], [place].

## 2013-06-29 NOTE — ED Notes (Signed)
Calmer at this time; easily arouses from sleeping with verbal stimulation. Still refuses to answer questions about herself, just states "what does it matter."
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
## 2013-06-29 NOTE — ED Notes (Signed)
Removed patient's clothing and entire body checked for injury. No visible injury noted. Patient denies pain.

## 2013-06-29 NOTE — ED Notes (Signed)
Boyfriend arrived and she is much more alert talking to him. Reports she has been drinking for the past 3 days because he doesn't care and neither do her children. She does not remember what happened today. Reports drinking liquor all day. When asked how much she states, "Not enough." Cursing at boyfriend and telling him to leave. Speech remains very slurred and she is laughing at inappropriate times and repeating her same questions.

## 2013-06-30 MED ORDER — LORAZEPAM 1 MG PO TABS: 1.0000 mg | ORAL_TABLET | Freq: Three times a day (TID) | ORAL | Status: DC | PRN

## 2013-06-30 MED ORDER — LORAZEPAM 2 MG/ML IJ SOLN: 0.0000 mg | Freq: Four times a day (QID) | INTRAMUSCULAR | Status: DC

## 2013-06-30 MED ORDER — NICOTINE 21 MG/24HR TD PT24: 21.0000 mg | MEDICATED_PATCH | Freq: Every day | TRANSDERMAL | Status: DC

## 2013-06-30 MED ORDER — ALUM & MAG HYDROXIDE-SIMETH 200-200-20 MG/5ML PO SUSP: 30.0000 mL | ORAL | Status: DC | PRN

## 2013-06-30 MED ORDER — THIAMINE HCL 100 MG/ML IJ SOLN: 100.0000 mg | Freq: Every day | INTRAMUSCULAR | Status: DC

## 2013-06-30 MED ORDER — ZOLPIDEM TARTRATE 5 MG PO TABS: 5.0000 mg | ORAL_TABLET | Freq: Every evening | ORAL | Status: DC | PRN

## 2013-06-30 MED ORDER — LORAZEPAM 1 MG PO TABS: 0.0000 mg | ORAL_TABLET | Freq: Two times a day (BID) | ORAL | Status: DC

## 2013-06-30 MED ORDER — VITAMIN B-1 100 MG PO TABS: 100.0000 mg | ORAL_TABLET | Freq: Every day | ORAL | Status: DC

## 2013-06-30 MED ORDER — ONDANSETRON HCL 4 MG PO TABS: 4.0000 mg | ORAL_TABLET | Freq: Three times a day (TID) | ORAL | Status: DC | PRN

## 2013-06-30 MED ORDER — LORAZEPAM 2 MG/ML IJ SOLN: 0.0000 mg | Freq: Two times a day (BID) | INTRAMUSCULAR | Status: DC

## 2013-06-30 MED ORDER — ACETAMINOPHEN 325 MG PO TABS: 650.0000 mg | ORAL_TABLET | ORAL | Status: DC | PRN

## 2013-06-30 MED ORDER — LORAZEPAM 1 MG PO TABS
0.0000 mg | ORAL_TABLET | Freq: Four times a day (QID) | ORAL | Status: DC
  Administered 2013-06-30: 2 mg via ORAL
  Filled 2013-06-30: qty 2

## 2013-06-30 MED ORDER — IBUPROFEN 200 MG PO TABS: 600.0000 mg | ORAL_TABLET | Freq: Three times a day (TID) | ORAL | Status: DC | PRN

## 2013-06-30 NOTE — ED Notes (Signed)
Agitated, using foul language, verbally abusive to staff, [employer] called back to room several times. Pt looking for her dog and was told that [employer] took her dog to her home. Very angry with staff, snack and drink provided for ptaient

## 2013-06-30 NOTE — ED Notes (Signed)
Sleeping now

## 2013-06-30 NOTE — ED Provider Notes (Signed)
Medical screening examination/treatment/procedure(s) were conducted as a shared visit with non-physician practitioner(s) and myself.  I personally evaluated the patient during the encounter.  EKG Interpretation   None       6:55 AM Patient is awake and sober.  Discharge home in good condition.  No homicidal or suicidal thoughts.  Patient is a candidate for outpatient alcohol detox.   Hoy Morn, MD 06/30/13 [phone]

## 2013-06-30 NOTE — ED Provider Notes (Signed)
Medical screening examination/treatment/procedure(s) were performed by non-physician practitioner and as supervising physician I was immediately available for consultation/collaboration.  Richarda Blade, MD 06/30/13 0111

## 2013-07-01 ENCOUNTER — Encounter: Payer: Self-pay | Admitting: Obstetrics & Gynecology

## 2013-07-03 LAB — DRUG SCREEN PANEL (SERUM)

## 2013-09-10 ENCOUNTER — Ambulatory Visit: Payer: Self-pay

## 2013-11-14 ENCOUNTER — Other Ambulatory Visit: Payer: Self-pay | Admitting: Obstetrics and Gynecology

## 2013-11-14 DIAGNOSIS — Z1231 Encounter for screening mammogram for malignant neoplasm of breast: Secondary | ICD-10-CM

## 2013-12-03 ENCOUNTER — Ambulatory Visit (HOSPITAL_COMMUNITY): Payer: Self-pay

## 2013-12-03 ENCOUNTER — Ambulatory Visit (HOSPITAL_COMMUNITY): Payer: Self-pay | Attending: Obstetrics and Gynecology

## 2014-01-15 ENCOUNTER — Emergency Department (HOSPITAL_COMMUNITY)
Admission: EM | Admit: 2014-01-15 | Discharge: 2014-01-15 | Disposition: A | Discharge status: Home / Self Care | Payer: Self-pay | Attending: Emergency Medicine | Admitting: Emergency Medicine

## 2014-01-15 ENCOUNTER — Encounter (HOSPITAL_COMMUNITY): Payer: Self-pay | Admitting: Emergency Medicine

## 2014-01-15 DIAGNOSIS — J438 Other emphysema: Secondary | ICD-10-CM | POA: Insufficient documentation

## 2014-01-15 DIAGNOSIS — R51 Headache: Secondary | ICD-10-CM | POA: Insufficient documentation

## 2014-01-15 DIAGNOSIS — R519 Headache, unspecified: Secondary | ICD-10-CM

## 2014-01-15 DIAGNOSIS — Z862 Personal history of diseases of the blood and blood-forming organs and certain disorders involving the immune mechanism: Secondary | ICD-10-CM | POA: Insufficient documentation

## 2014-01-15 DIAGNOSIS — Z8619 Personal history of other infectious and parasitic diseases: Secondary | ICD-10-CM | POA: Insufficient documentation

## 2014-01-15 DIAGNOSIS — Z8781 Personal history of (healed) traumatic fracture: Secondary | ICD-10-CM | POA: Insufficient documentation

## 2014-01-15 DIAGNOSIS — Z8669 Personal history of other diseases of the nervous system and sense organs: Secondary | ICD-10-CM | POA: Insufficient documentation

## 2014-01-15 DIAGNOSIS — I10 Essential (primary) hypertension: Secondary | ICD-10-CM | POA: Insufficient documentation

## 2014-01-15 DIAGNOSIS — R209 Unspecified disturbances of skin sensation: Secondary | ICD-10-CM | POA: Insufficient documentation

## 2014-01-15 DIAGNOSIS — F17200 Nicotine dependence, unspecified, uncomplicated: Secondary | ICD-10-CM | POA: Insufficient documentation

## 2014-01-15 LAB — BASIC METABOLIC PANEL
Anion gap: 15 (ref 5–15)
BUN: 10 mg/dL (ref 6–23)
CALCIUM: 8.9 mg/dL (ref 8.4–10.5)
CO2: 24 mEq/L (ref 19–32)
Chloride: 100 mEq/L (ref 96–112)
Creatinine, Ser: 0.59 mg/dL (ref 0.50–1.10)
GFR calc Af Amer: >90 mL/min (ref >90)
GLUCOSE: 83 mg/dL (ref 70–99)
Potassium: 4.3 mEq/L (ref 3.7–5.3)
SODIUM: 139 meq/L (ref 137–147)

## 2014-01-15 LAB — CBC
HCT: 43.7 % (ref 36.0–46.0)
HEMOGLOBIN: 14.6 g/dL (ref 12.0–15.0)
MCH: 33.7 pg (ref 26.0–34.0)
MCHC: 33.4 g/dL (ref 30.0–36.0)
MCV: 100.9 fL — ABNORMAL HIGH (ref 78.0–100.0)
Platelets: 220 10*3/uL (ref 150–400)
RBC: 4.33 MIL/uL (ref 3.87–5.11)
RDW: 13.6 % (ref 11.5–15.5)
WBC: 12.5 10*3/uL — ABNORMAL HIGH (ref 4.0–10.5)

## 2014-01-15 MED ORDER — DEXAMETHASONE SODIUM PHOSPHATE 10 MG/ML IJ SOLN
10.0000 mg | Freq: Once | INTRAMUSCULAR | Status: AC
  Administered 2014-01-15: 10 mg via INTRAVENOUS
  Filled 2014-01-15: qty 1

## 2014-01-15 MED ORDER — DEXTROSE 5 % IV SOLN
20.0000 mg | Freq: Once | INTRAVENOUS | Status: AC
  Administered 2014-01-15: 20 mg via INTRAVENOUS
  Filled 2014-01-15 (×2): qty 4

## 2014-01-15 MED ORDER — DIPHENHYDRAMINE HCL 50 MG/ML IJ SOLN
25.0000 mg | Freq: Once | INTRAMUSCULAR | Status: AC
  Administered 2014-01-15: 25 mg via INTRAVENOUS
  Filled 2014-01-15: qty 1

## 2014-01-15 MED ORDER — SODIUM CHLORIDE 0.9 % IV BOLUS (SEPSIS)
1000.0000 mL | Freq: Once | INTRAVENOUS | Status: AC
  Administered 2014-01-15: 1000 mL via INTRAVENOUS

## 2014-01-15 MED ORDER — ALBUTEROL SULFATE HFA 108 (90 BASE) MCG/ACT IN AERS
2.0000 | INHALATION_SPRAY | Freq: Four times a day (QID) | RESPIRATORY_TRACT | Status: DC
  Administered 2014-01-15: 2 via RESPIRATORY_TRACT
  Filled 2014-01-15: qty 6.7

## 2014-01-15 MED ORDER — KETOROLAC TROMETHAMINE 30 MG/ML IJ SOLN
30.0000 mg | Freq: Once | INTRAMUSCULAR | Status: AC
  Administered 2014-01-15: 30 mg via INTRAVENOUS
  Filled 2014-01-15: qty 1

## 2014-01-15 NOTE — ED Notes (Signed)
Case Management at the bedside.

## 2014-01-15 NOTE — Discharge Instructions (Signed)
As discussed it is very important that you follow up with your neurologist for additional evaluation and management of your MS and headaches.  Please be sure to return here for any concerning changes in your condition.

## 2014-01-15 NOTE — Progress Notes (Signed)
  CARE MANAGEMENT ED NOTE 01/15/2014  Patient:  Christina Miller, Christina Miller   Account Number:  [id number]  Date Initiated:  01/15/2014  Documentation initiated by:  Edwyna Shell  Subjective/Objective Assessment:   48 yo female presenting to the ED with a HA     Subjective/Objective Assessment Detail:     Action/Plan:   Patient is to follow up with orange card application for PCP and medication access.   Action/Plan Detail:   Anticipated DC Date:       Status Recommendation to Physician:   Result of Recommendation:  Agreed    DC Planning Services  Other    Choice offered to / List presented to:  C-1 Patient          Status of service:  Completed, signed off  ED Comments:   ED Comments Detail:  CM spoke with patient regarding the ED visit. The patient stated that the [employer] rep [facility]) provided an application for the orange card and the patient stated that the orange card will help her with her hospital bills. This CM provided education regarding the orange card to the patient. Explained that the orange card is based on financial eligibility which determines her PCP copay and pharmacy copay. Encouraged patient to follow up with orange card. Explained that she used the [facility] program September of last year so she is not eligible to use the program until after September of this year. Explained that she can use the $4.00 [facility] medication list for prescriptions and medications until she obtains the orange card. Patient verbalized understanding. ED staff nurse and EDP updated. No further questions or concerns

## 2014-01-15 NOTE — ED Notes (Signed)
She states she has felt dizzy and had a headache abnormal bruising for the past 3 weeks. She states "im worried because i have MS."

## 2014-01-15 NOTE — Discharge Planning (Signed)
[place] to patient about primary care resources and establishing care with a provider. Patient was given the orange card application and  resource guide. Patient was instructed to contact me once her application was complete for an appointment. My contact information was provided for future questions or concerns. No other needs expressed at this time.

## 2014-01-15 NOTE — ED Provider Notes (Signed)
CSN: [id number]     Arrival date & time 01/15/14  0957 History   First MD Initiated Contact with Patient 01/15/14 1105     Chief Complaint  Patient presents with  . Headache     (Consider location/radiation/quality/duration/timing/severity/associated sxs/prior Treatment) HPI Patient presents with headache, intermittent dysesthesia in multiple areas. Patient has multiple medical problems, including multiple sclerosis. Patient has not seen a primary care physician or neurologist in years. Currently she complains of episodic dysesthesia in the extremities, face for the past several months. And no focal weakness, no speech deficit, no visual changes that are new. Patient does complain of a headache, frontal, throbbing, moderate. Patient has had some relief with ibuprofen.  Past Medical History  Diagnosis Date  . Emphysema   . COPD (chronic obstructive pulmonary disease)   . MS (multiple sclerosis)   . Hypertension   . Headache(784.0)   . Anemia   . Infection     urinary tract infection  . Bone fracture     ankle  . Abnormal Pap smear     cryo   Past Surgical History  Procedure Laterality Date  . Tubal ligation    . Arm wound repair / closure    . Dilation and curettage of uterus     Family History  Problem Relation Age of Onset  . Other Neg Hx   . Diabetes Father   . Diabetes Sister   . Hypertension Sister   . Diabetes Brother   . Hypertension Brother    History  Substance Use Topics  . Smoking status: Current Every Day Smoker    Types: Cigarettes  . Smokeless tobacco: Not on file  . Alcohol Use: Yes     Comment: couple times a wk   OB History   Grav Para Term Preterm Abortions TAB SAB Ect Mult Living   7 2 1 1 5 2 3   2      Review of Systems  Constitutional:       Per HPI, otherwise negative  HENT:       Per HPI, otherwise negative  Respiratory:       Per HPI, otherwise negative  Cardiovascular:       Per HPI, otherwise negative  Gastrointestinal:  Negative for vomiting.  Endocrine:       Negative aside from HPI  Genitourinary:       Neg aside from HPI   Musculoskeletal:       Per HPI, otherwise negative  Skin: Negative.   Neurological: Positive for numbness and headaches. Negative for syncope and weakness.      Allergies  Hydrocodone-acetaminophen  Home Medications   Prior to Admission medications   Medication Sig Start Date End Date Taking? Authorizing Provider  ibuprofen (ADVIL,MOTRIN) 200 MG tablet Take 800 mg by mouth every 6 (six) hours as needed for pain.    Yes Historical Provider, MD   BP 110/79  Pulse 93  Temp(Src) 97.6 F (36.4 C) (Oral)  Resp 20  Ht 5' 2"  (1.575 m)  Wt 120 lb (54.432 kg)  BMI 21.94 kg/m2  SpO2 97%  LMP 06/20/2012 Physical Exam  Nursing note and vitals reviewed. Constitutional: She is oriented to person, place, and time. She appears well-developed and well-nourished. No distress.  HENT:  Head: Normocephalic and atraumatic.  Eyes: Conjunctivae and EOM are normal.  Cardiovascular: Normal rate and regular rhythm.   Pulmonary/Chest: Effort normal and breath sounds normal. No stridor. No respiratory distress.  Abdominal: She exhibits no distension.  Musculoskeletal: She exhibits no edema.  Neurological: She is alert and oriented to person, place, and time. She displays no atrophy and no tremor. No cranial nerve deficit or sensory deficit. She exhibits normal muscle tone. She displays no seizure activity. Coordination normal.  Skin: Skin is warm and dry.  Psychiatric: She has a normal mood and affect.    ED Course  Procedures (including critical care time) Labs Review Labs Reviewed  CBC - Abnormal; Notable for the following:    WBC 12.5 (*)    MCV 100.9 (*)    All other components within normal limits  BASIC METABOLIC PANEL    Imaging Review No results found.    After the initial evaluation for patient's chart, including multiple prior emergency department visit.  During the  initial evaluation, I occurred smoking, specifically do to her MS, concern for exacerbation of symptoms.   Update: Symptoms have resolved. Case management has seen the patient and assisted with obtaining health coverage.  MDM   Final diagnoses:  Acute nonintractable headache, unspecified headache type    Patient with MS, no recent medical evaluation a presents with no headache.  Patient is awake and alert, with a reassuring neurologic exam, and her headache resolves with fluids and medication here. Given the patient's lack of prior medical evaluation, we arranged for her to obtain medical coverage, and with resolution of her symptoms, she was discharged to follow up with neurology as an outpatient.     Carmin Muskrat, MD 01/15/14 [phone]

## 2014-03-10 ENCOUNTER — Ambulatory Visit: Payer: Self-pay

## 2014-03-19 ENCOUNTER — Emergency Department: Payer: Self-pay | Admitting: Emergency Medicine

## 2014-03-19 LAB — CBC
HCT: 41.9 % (ref 35.0–47.0)
HGB: 13.7 g/dL (ref 12.0–16.0)
MCH: 32.9 pg (ref 26.0–34.0)
MCHC: 32.8 g/dL (ref 32.0–36.0)
MCV: 101 fL — ABNORMAL HIGH (ref 80–100)
Platelet: 254 10*3/uL (ref 150–440)
RBC: 4.17 10*6/uL (ref 3.80–5.20)
RDW: 12.6 % (ref 11.5–14.5)
WBC: 10.4 10*3/uL (ref 3.6–11.0)

## 2014-03-19 LAB — ETHANOL: Ethanol: 347 mg/dL

## 2014-03-19 LAB — COMPREHENSIVE METABOLIC PANEL
ALBUMIN: 3.5 g/dL (ref 3.4–5.0)
Alkaline Phosphatase: 58 U/L
Anion Gap: 6 — ABNORMAL LOW (ref 7–16)
BUN: 8 mg/dL (ref 7–18)
Bilirubin,Total: 0.2 mg/dL (ref 0.2–1.0)
Calcium, Total: 8.4 mg/dL — ABNORMAL LOW (ref 8.5–10.1)
Chloride: 112 mmol/L — ABNORMAL HIGH (ref 98–107)
Co2: 27 mmol/L (ref 21–32)
Creatinine: 0.7 mg/dL (ref 0.60–1.30)
GLUCOSE: 92 mg/dL (ref 65–99)
Osmolality: 287 (ref 275–301)
POTASSIUM: 3.4 mmol/L — AB (ref 3.5–5.1)
SGOT(AST): 28 U/L (ref 15–37)
SGPT (ALT): 14 U/L
Sodium: 145 mmol/L (ref 136–145)
Total Protein: 7.4 g/dL (ref 6.4–8.2)

## 2014-03-19 LAB — ACETAMINOPHEN LEVEL

## 2014-03-19 LAB — SALICYLATE LEVEL: Salicylates, Serum: 3.4 mg/dL — ABNORMAL HIGH

## 2014-03-20 LAB — URINALYSIS, COMPLETE
BACTERIA: NONE SEEN
Bilirubin,UR: NEGATIVE
Glucose,UR: NEGATIVE mg/dL (ref 0–75)
Ketone: NEGATIVE
Leukocyte Esterase: NEGATIVE
Nitrite: NEGATIVE
Ph: 5 (ref 4.5–8.0)
Protein: NEGATIVE
RBC,UR: 2 /HPF (ref 0–5)
SPECIFIC GRAVITY: 1.015 (ref 1.003–1.030)
Squamous Epithelial: 3

## 2014-03-20 LAB — DRUG SCREEN, URINE
AMPHETAMINES, UR SCREEN: NEGATIVE (ref ?–1000)
Barbiturates, Ur Screen: NEGATIVE (ref ?–200)
Benzodiazepine, Ur Scrn: NEGATIVE (ref ?–200)
CANNABINOID 50 NG, UR ~~LOC~~: POSITIVE (ref ?–50)
COCAINE METABOLITE, UR ~~LOC~~: NEGATIVE (ref ?–300)
MDMA (Ecstasy)Ur Screen: NEGATIVE (ref ?–500)
Methadone, Ur Screen: NEGATIVE (ref ?–300)
OPIATE, UR SCREEN: NEGATIVE (ref ?–300)
PHENCYCLIDINE (PCP) UR S: NEGATIVE (ref ?–25)
Tricyclic, Ur Screen: NEGATIVE (ref ?–1000)

## 2014-05-19 ENCOUNTER — Encounter (HOSPITAL_COMMUNITY): Payer: Self-pay | Admitting: Emergency Medicine

## 2014-05-26 ENCOUNTER — Emergency Department (HOSPITAL_COMMUNITY)
Admission: EM | Admit: 2014-05-26 | Discharge: 2014-05-26 | Disposition: A | Discharge status: Home / Self Care | Payer: Self-pay | Attending: Emergency Medicine | Admitting: Emergency Medicine

## 2014-05-26 ENCOUNTER — Emergency Department (HOSPITAL_COMMUNITY): Payer: Self-pay

## 2014-05-26 ENCOUNTER — Encounter (HOSPITAL_COMMUNITY): Payer: Self-pay

## 2014-05-26 DIAGNOSIS — R0602 Shortness of breath: Secondary | ICD-10-CM

## 2014-05-26 DIAGNOSIS — J441 Chronic obstructive pulmonary disease with (acute) exacerbation: Secondary | ICD-10-CM | POA: Insufficient documentation

## 2014-05-26 DIAGNOSIS — J45901 Unspecified asthma with (acute) exacerbation: Secondary | ICD-10-CM

## 2014-05-26 DIAGNOSIS — Z8781 Personal history of (healed) traumatic fracture: Secondary | ICD-10-CM | POA: Insufficient documentation

## 2014-05-26 DIAGNOSIS — Z862 Personal history of diseases of the blood and blood-forming organs and certain disorders involving the immune mechanism: Secondary | ICD-10-CM | POA: Insufficient documentation

## 2014-05-26 DIAGNOSIS — Z8619 Personal history of other infectious and parasitic diseases: Secondary | ICD-10-CM | POA: Insufficient documentation

## 2014-05-26 DIAGNOSIS — Z8669 Personal history of other diseases of the nervous system and sense organs: Secondary | ICD-10-CM | POA: Insufficient documentation

## 2014-05-26 DIAGNOSIS — I10 Essential (primary) hypertension: Secondary | ICD-10-CM | POA: Insufficient documentation

## 2014-05-26 DIAGNOSIS — Z720 Tobacco use: Secondary | ICD-10-CM | POA: Insufficient documentation

## 2014-05-26 LAB — BASIC METABOLIC PANEL
ANION GAP: 15 (ref 5–15)
BUN: 12 mg/dL (ref 6–23)
CALCIUM: 9.4 mg/dL (ref 8.4–10.5)
CHLORIDE: 97 meq/L (ref 96–112)
CO2: 26 mEq/L (ref 19–32)
CREATININE: 0.62 mg/dL (ref 0.50–1.10)
Glucose, Bld: 121 mg/dL — ABNORMAL HIGH (ref 70–99)
Potassium: 4.6 mEq/L (ref 3.7–5.3)
Sodium: 138 mEq/L (ref 137–147)

## 2014-05-26 LAB — CBC
HCT: 44 % (ref 36.0–46.0)
Hemoglobin: 14.6 g/dL (ref 12.0–15.0)
MCH: 32.6 pg (ref 26.0–34.0)
MCHC: 33.2 g/dL (ref 30.0–36.0)
MCV: 98.2 fL (ref 78.0–100.0)
PLATELETS: 200 10*3/uL (ref 150–400)
RBC: 4.48 MIL/uL (ref 3.87–5.11)
RDW: 13.5 % (ref 11.5–15.5)
WBC: 6.5 10*3/uL (ref 4.0–10.5)

## 2014-05-26 LAB — TROPONIN I: Troponin I: <0.3 ng/mL (ref <0.30)

## 2014-05-26 MED ORDER — ALBUTEROL SULFATE HFA 108 (90 BASE) MCG/ACT IN AERS
2.0000 | INHALATION_SPRAY | RESPIRATORY_TRACT | Status: DC
  Administered 2014-05-26: 2 via RESPIRATORY_TRACT
  Filled 2014-05-26: qty 6.7

## 2014-05-26 MED ORDER — PREDNISONE 10 MG PO TABS: 60.0000 mg | ORAL_TABLET | Freq: Every day | ORAL | Status: DC

## 2014-05-26 MED ORDER — IPRATROPIUM BROMIDE 0.02 % IN SOLN
0.5000 mg | Freq: Once | RESPIRATORY_TRACT | Status: AC
  Administered 2014-05-26: 0.5 mg via RESPIRATORY_TRACT
  Filled 2014-05-26: qty 2.5

## 2014-05-26 MED ORDER — ALBUTEROL SULFATE (2.5 MG/3ML) 0.083% IN NEBU
5.0000 mg | INHALATION_SOLUTION | Freq: Once | RESPIRATORY_TRACT | Status: AC
  Administered 2014-05-26: 5 mg via RESPIRATORY_TRACT
  Filled 2014-05-26: qty 6

## 2014-05-26 NOTE — ED Notes (Signed)
Pt has had a productive cough for the past 2-3 weeks with light green mucus. Pt having some chest pressure from the congestion.

## 2014-05-26 NOTE — ED Notes (Signed)
Pt undressed, in gown, on continuous pulse oximetry and blood pressure cuff

## 2014-05-26 NOTE — ED Notes (Signed)
Dr. Venora Maples at the bedside.

## 2014-05-26 NOTE — ED Provider Notes (Signed)
CSN: [id number]     Arrival date & time 05/26/14  0100 History   First MD Initiated Contact with Patient 05/26/14 [phone]     Chief Complaint  Patient presents with  . Nasal Congestion  . Cough      HPI Patient reports productive cough for the past 2-3 weeks with increasing cough and productivity this week.  She reports generally fatigued.  She reports some pressure to her anterior chest.  No unilateral leg swelling.  No history DVT or pulmonary embolism.  The patient does have COPD.she continues to smoke cigarettes.  She also has a history of multiple sclerosis.  She has intermittently tried her albuterol inhaler without improvement in her symptoms.  She is currently not on steroids.no recent surgery or long travel.   Past Medical History  Diagnosis Date  . Emphysema   . COPD (chronic obstructive pulmonary disease)   . MS (multiple sclerosis)   . Hypertension   . Headache(784.0)   . Anemia   . Infection     urinary tract infection  . Bone fracture     ankle  . Abnormal Pap smear     cryo   Past Surgical History  Procedure Laterality Date  . Tubal ligation    . Arm wound repair / closure    . Dilation and curettage of uterus     Family History  Problem Relation Age of Onset  . Other Neg Hx   . Diabetes Father   . Diabetes Sister   . Hypertension Sister   . Diabetes Brother   . Hypertension Brother    History  Substance Use Topics  . Smoking status: Current Every Day Smoker    Types: Cigarettes  . Smokeless tobacco: Not on file  . Alcohol Use: Yes     Comment: couple times a wk   OB History    Gravida Para Term Preterm AB TAB SAB Ectopic Multiple Living   7 2 1 1 5 2 3   2      Review of Systems  All other systems reviewed and are negative.     Allergies  Hydrocodone-acetaminophen  Home Medications   Prior to Admission medications   Medication Sig Start Date End Date Taking? Authorizing Provider  ibuprofen (ADVIL,MOTRIN) 200 MG tablet Take 800 mg by  mouth every 6 (six) hours as needed for pain.     Historical Provider, MD   BP 151/105 mmHg  Pulse 102  Temp(Src) 97.9 F (36.6 C) (Oral)  Resp 18  SpO2 96%  LMP 06/20/2012 Physical Exam  Constitutional: She is oriented to person, place, and time. She appears well-developed and well-nourished. No distress.  HENT:  Head: Normocephalic and atraumatic.  Eyes: EOM are normal.  Neck: Normal range of motion.  Cardiovascular: Normal rate, regular rhythm and normal heart sounds.   Pulmonary/Chest: Effort normal. She has wheezes.  Abdominal: Soft. She exhibits no distension. There is no tenderness.  Musculoskeletal: Normal range of motion.  Neurological: She is alert and oriented to person, place, and time.  Skin: Skin is warm and dry.  Psychiatric: She has a normal mood and affect. Judgment normal.  Nursing note and vitals reviewed.   ED Course  Procedures (including critical care time) Labs Review Labs Reviewed  BASIC METABOLIC PANEL - Abnormal; Notable for the following:    Glucose, Bld 121 (*)    All other components within normal limits  CBC  TROPONIN I    Imaging Review Dg Chest 2 View  05/26/2014   CLINICAL DATA:  Shortness of breath, cough, congestion for 3- 4 weeks.  EXAM: CHEST  2 VIEW  COMPARISON:  Dec 03, 2012  FINDINGS: The heart size and mediastinal contours are within normal limits. There is no focal infiltrate, pulmonary edema, or pleural effusion. The visualized skeletal structures are unremarkable.  IMPRESSION: No active cardiopulmonary disease.   Electronically Signed   By: Abelardo Diesel M.D.   On: 05/26/2014 09:47  I personally reviewed the imaging tests through PACS system I reviewed available ER/hospitalization records through the EMR    EKG Interpretation   Date/Time:  Monday May 26 2014 08:59:59 EST Ventricular Rate:  100 PR Interval:  135 QRS Duration: 81 QT Interval:  409 QTC Calculation: 528 R Axis:   92 Text Interpretation:  Sinus  tachycardia Borderline right axis deviation  Prolonged QT interval Baseline wander in lead(s) V1 V4 No significant  change was found Confirmed by Ziair Penson  MD, Deontae Robson ([id number]) on 05/26/2014  9:57:58 AM      MDM   Final diagnoses:  SOB (shortness of breath)  Reactive airway disease, unspecified asthma severity, with acute exacerbation    Patient feels much better this time.  Suspect reactive airway disease.  Improved with albuterol.  Home with steroids and albuterol.  Discharge home in good condition.  Referral to the wellness Center.  She understands to return to the ER for new or worsening symptoms    Hoy Morn, MD 05/26/14 1057

## 2014-05-26 NOTE — ED Notes (Signed)
Attempted to draw blood x 2 without success-- phlebotomy noitified

## 2014-06-05 ENCOUNTER — Ambulatory Visit (HOSPITAL_COMMUNITY): Payer: Self-pay | Attending: Obstetrics and Gynecology

## 2014-06-05 ENCOUNTER — Ambulatory Visit (HOSPITAL_COMMUNITY): Payer: Self-pay

## 2016-03-08 ENCOUNTER — Encounter (HOSPITAL_COMMUNITY): Payer: Self-pay | Admitting: Emergency Medicine

## 2016-03-08 ENCOUNTER — Emergency Department (HOSPITAL_COMMUNITY)
Admission: EM | Admit: 2016-03-08 | Discharge: 2016-03-08 | Disposition: A | Discharge status: Home / Self Care | Payer: Self-pay | Attending: Emergency Medicine | Admitting: Emergency Medicine

## 2016-03-08 ENCOUNTER — Emergency Department (HOSPITAL_COMMUNITY): Payer: Self-pay

## 2016-03-08 DIAGNOSIS — M79605 Pain in left leg: Secondary | ICD-10-CM | POA: Insufficient documentation

## 2016-03-08 DIAGNOSIS — J449 Chronic obstructive pulmonary disease, unspecified: Secondary | ICD-10-CM | POA: Insufficient documentation

## 2016-03-08 DIAGNOSIS — Z79899 Other long term (current) drug therapy: Secondary | ICD-10-CM | POA: Insufficient documentation

## 2016-03-08 DIAGNOSIS — I10 Essential (primary) hypertension: Secondary | ICD-10-CM | POA: Insufficient documentation

## 2016-03-08 DIAGNOSIS — F17210 Nicotine dependence, cigarettes, uncomplicated: Secondary | ICD-10-CM | POA: Insufficient documentation

## 2016-03-08 DIAGNOSIS — R531 Weakness: Secondary | ICD-10-CM | POA: Insufficient documentation

## 2016-03-08 LAB — CBC WITH DIFFERENTIAL/PLATELET
BASOS ABS: 0 10*3/uL (ref 0.0–0.1)
BASOS PCT: 0 %
EOS ABS: 0.2 10*3/uL (ref 0.0–0.7)
EOS PCT: 2 %
HCT: 41.7 % (ref 36.0–46.0)
Hemoglobin: 13.5 g/dL (ref 12.0–15.0)
LYMPHS PCT: 27 %
Lymphs Abs: 2.8 10*3/uL (ref 0.7–4.0)
MCH: 32.2 pg (ref 26.0–34.0)
MCHC: 32.4 g/dL (ref 30.0–36.0)
MCV: 99.5 fL (ref 78.0–100.0)
Monocytes Absolute: 0.9 10*3/uL (ref 0.1–1.0)
Monocytes Relative: 9 %
Neutro Abs: 6.4 10*3/uL (ref 1.7–7.7)
Neutrophils Relative %: 62 %
PLATELETS: 211 10*3/uL (ref 150–400)
RBC: 4.19 MIL/uL (ref 3.87–5.11)
RDW: 13.6 % (ref 11.5–15.5)
WBC: 10.4 10*3/uL (ref 4.0–10.5)

## 2016-03-08 LAB — COMPREHENSIVE METABOLIC PANEL
ALBUMIN: 3.9 g/dL (ref 3.5–5.0)
ALT: 8 U/L — ABNORMAL LOW (ref 14–54)
AST: 20 U/L (ref 15–41)
Alkaline Phosphatase: 54 U/L (ref 38–126)
Anion gap: 8 (ref 5–15)
BUN: 10 mg/dL (ref 6–20)
CHLORIDE: 105 mmol/L (ref 101–111)
CO2: 22 mmol/L (ref 22–32)
Calcium: 9.3 mg/dL (ref 8.9–10.3)
Creatinine, Ser: 0.63 mg/dL (ref 0.44–1.00)
GFR calc Af Amer: >60 mL/min (ref >60)
GFR calc non Af Amer: >60 mL/min (ref >60)
Glucose, Bld: 91 mg/dL (ref 65–99)
Potassium: 4 mmol/L (ref 3.5–5.1)
SODIUM: 135 mmol/L (ref 135–145)
Total Bilirubin: 0.6 mg/dL (ref 0.3–1.2)
Total Protein: 7.3 g/dL (ref 6.5–8.1)

## 2016-03-08 MED ORDER — TRAMADOL HCL 50 MG PO TABS: 50.0000 mg | ORAL_TABLET | Freq: Four times a day (QID) | ORAL | 0 refills | Status: DC | PRN

## 2016-03-08 MED ORDER — TRAMADOL HCL 50 MG PO TABS
50.0000 mg | ORAL_TABLET | Freq: Once | ORAL | Status: AC
  Administered 2016-03-08: 50 mg via ORAL
  Filled 2016-03-08: qty 1

## 2016-03-08 MED ORDER — CYCLOBENZAPRINE HCL 10 MG PO TABS
5.0000 mg | ORAL_TABLET | Freq: Once | ORAL | Status: AC
  Administered 2016-03-08: 5 mg via ORAL
  Filled 2016-03-08: qty 1

## 2016-03-08 MED ORDER — METHYLPREDNISOLONE 4 MG PO TBPK: ORAL_TABLET | ORAL | 0 refills | Status: DC

## 2016-03-08 NOTE — ED Notes (Signed)
Patient transported to MRI 

## 2016-03-08 NOTE — ED Provider Notes (Signed)
[facility] DEPT Provider Note   CSN: [id number] Arrival date & time: 03/08/16  1223     History   Chief Complaint Chief Complaint  Patient presents with  . Leg Pain    HPI Christina Miller is a 50 y.o. female.  HPI  Patient with multiple medical comorbidities, including MS and poor medical followup, presents for leg pain.  She reports left sided thigh numbness for 6 months. Now, she has left posterior leg pain, aching in nature, for one week, that radiates from her buttock to her heel.  Worse with walking.  Denies recent falls or other injury.  Denies fevers.    Past Medical History:  Diagnosis Date  . Abnormal Pap smear    cryo  . Anemia   . Bone fracture    ankle  . COPD (chronic obstructive pulmonary disease) ([place])   . Emphysema   . Headache(784.0)   . Hypertension   . Infection    urinary tract infection  . MS (multiple sclerosis) [facility])     Patient Active Problem List   Diagnosis Date Noted  . Breast lump on left side at 7 o'clock position 06/22/2012  . Hypertension, benign 06/02/2011  . Tobacco abuse 06/02/2011  . Perimenopause 06/02/2011  . Hematuria, microscopic 06/02/2011    Past Surgical History:  Procedure Laterality Date  . ARM WOUND REPAIR / CLOSURE    . DILATION AND CURETTAGE OF UTERUS    . TUBAL LIGATION      OB History    Gravida Para Term Preterm AB Living   7 2 1 1 5 2    SAB TAB Ectopic Multiple Live Births   3 2     1        Home Medications    Prior to Admission medications   Medication Sig Start Date End Date Taking? Authorizing Provider  albuterol (PROVENTIL HFA;VENTOLIN HFA) 108 (90 BASE) MCG/ACT inhaler Inhale 1 puff into the lungs every 6 (six) hours as needed for wheezing or shortness of breath.    Historical Provider, MD  predniSONE (DELTASONE) 10 MG tablet Take 6 tablets (60 mg total) by mouth daily. 05/26/14   Jola Schmidt, MD    Family History Family History  Problem Relation Age of Onset  . Other Neg Hx   .  Diabetes Father   . Diabetes Sister   . Hypertension Sister   . Diabetes Brother   . Hypertension Brother     Social History Social History  Substance Use Topics  . Smoking status: Current Every Day Smoker    Types: Cigarettes  . Smokeless tobacco: Not on file  . Alcohol use Yes     Comment: couple times a wk     Allergies   Hydrocodone-acetaminophen   Review of Systems Review of Systems  Constitutional: Negative for chills and fever.  HENT: Negative for ear pain and sore throat.   Eyes: Negative for pain and visual disturbance.  Respiratory: Negative for cough and shortness of breath.   Cardiovascular: Negative for chest pain and palpitations.  Gastrointestinal: Negative for abdominal pain and vomiting.  Genitourinary: Negative for dysuria and hematuria.  Musculoskeletal: Positive for back pain and myalgias.  Skin: Negative for color change and rash.  Neurological: Negative for seizures and syncope.  All other systems reviewed and are negative.    Physical Exam Updated Vital Signs BP 112/90   Pulse 97   Temp 97.6 F (36.4 C) (Oral)   Resp 18   LMP 06/20/2012   SpO2  98%   Physical Exam  Constitutional: She appears well-developed and well-nourished. No distress.  HENT:  Head: Normocephalic and atraumatic.  Eyes: Conjunctivae are normal.  Neck: Neck supple.  Cardiovascular: Normal rate and regular rhythm.   No murmur heard. Pulmonary/Chest: Effort normal and breath sounds normal. No respiratory distress.  Abdominal: Soft. There is no tenderness.  Musculoskeletal: She exhibits no edema.  Some decreased sensation to left medial thigh and left lateral calf.  Intact LE strength.  Tender to left SI.  Neurological: She is alert.  Alert and oriented CN II-XII grossly intact Eyes: PERRL, EOMI Bilateral UE strength 5/5 Bilateral LE strength 5/5 Intact finger-to-nose   Skin: Skin is warm and dry.  Psychiatric: She has a normal mood and affect.  Nursing note  and vitals reviewed.    ED Treatments / Results  Labs (all labs ordered are listed, but only abnormal results are displayed) Labs Reviewed  CBC WITH DIFFERENTIAL/PLATELET  COMPREHENSIVE METABOLIC PANEL    EKG  EKG Interpretation None       Radiology No results found.  Procedures Procedures (including critical care time)  Medications Ordered in ED Medications - No data to display   Initial Impression / Assessment and Plan / ED Course  I have reviewed the triage vital signs and the nursing notes.  Pertinent labs & imaging results that were available during my care of the patient were reviewed by me and considered in my medical decision making (see chart for details).  Clinical Course    On exam there is some LLE sensation loss, as noted above.  No LE swelling or other signs of DVT.  No midline spinal tenderness.    Discuss on phone with neurology who did not think description of symptoms sounded like MS flare.    Patient was tender over left SI joint, so consider sacroilliits.  MRI of lumbar spine ordered and showed disc extrusion with L5 neuritis.  Consistent with physical exam.  Will given steroids.  Advised to f/u with NSG if not improved.  We have discussed the discharge plan, including the plan for outpatient followup, and strict return precautions, including those that would require calling 911.     Final Clinical Impressions(s) / ED Diagnoses   Final diagnoses:  None    New Prescriptions New Prescriptions   No medications on file     Levada Schilling, MD 03/08/16 2113    Drenda Freeze, MD 03/12/16 2135

## 2016-03-08 NOTE — ED Notes (Signed)
Pt back from MRI, Requesting to be discharged and she will call back for her mri and blood results.

## 2016-03-08 NOTE — ED Triage Notes (Signed)
Pt sts left leg pain with radiation from lower back down buttocks; pt sts numbness in leg x months from MS

## 2016-03-08 NOTE — ED Notes (Signed)
Pt agitated at wait time for MRI, Pt requesting blood results and discharge papers.

## 2016-04-19 ENCOUNTER — Encounter: Payer: Self-pay | Admitting: Critical Care Medicine

## 2016-04-19 LAB — GLUCOSE, POCT (MANUAL RESULT ENTRY): POC Glucose: 128 mg/dl — AB (ref 70–99)

## 2016-04-21 ENCOUNTER — Encounter: Payer: Self-pay | Admitting: Internal Medicine

## 2016-04-21 ENCOUNTER — Ambulatory Visit: Payer: Self-pay | Attending: Internal Medicine | Admitting: Internal Medicine

## 2016-04-21 VITALS — BP 116/77 | HR 101 | Temp 98.2°F | Resp 18 | Ht 62.0 in | Wt 124.8 lb

## 2016-04-21 DIAGNOSIS — Z5189 Encounter for other specified aftercare: Secondary | ICD-10-CM | POA: Insufficient documentation

## 2016-04-21 DIAGNOSIS — F17210 Nicotine dependence, cigarettes, uncomplicated: Secondary | ICD-10-CM | POA: Insufficient documentation

## 2016-04-21 DIAGNOSIS — F17200 Nicotine dependence, unspecified, uncomplicated: Secondary | ICD-10-CM | POA: Insufficient documentation

## 2016-04-21 DIAGNOSIS — J441 Chronic obstructive pulmonary disease with (acute) exacerbation: Secondary | ICD-10-CM | POA: Insufficient documentation

## 2016-04-21 DIAGNOSIS — M5126 Other intervertebral disc displacement, lumbar region: Secondary | ICD-10-CM | POA: Insufficient documentation

## 2016-04-21 MED ORDER — ALBUTEROL SULFATE HFA 108 (90 BASE) MCG/ACT IN AERS: 1.0000 | INHALATION_SPRAY | Freq: Four times a day (QID) | RESPIRATORY_TRACT | 3 refills | Status: DC | PRN

## 2016-04-21 MED ORDER — FLUTICASONE-SALMETEROL 250-50 MCG/DOSE IN AEPB: 1.0000 | INHALATION_SPRAY | Freq: Two times a day (BID) | RESPIRATORY_TRACT | 3 refills | Status: DC

## 2016-04-21 MED ORDER — PREDNISONE 10 MG PO TABS: 10.0000 mg | ORAL_TABLET | Freq: Every day | ORAL | 3 refills | Status: DC

## 2016-04-21 MED ORDER — ACETAMINOPHEN-CODEINE #3 300-30 MG PO TABS: 1.0000 | ORAL_TABLET | ORAL | 0 refills | Status: DC | PRN

## 2016-04-21 MED FILL — VENTOLIN HFA 90 MCG INHALER: 108 (90 BAS | 20 days supply | Qty: 18 | Fill #0

## 2016-04-21 MED FILL — ACETAMINOPHEN/COD #3 TABLET: 300-30 | 10 days supply | Qty: 60 | Fill #0

## 2016-04-21 MED FILL — ADVAIR 250/50 DISKUS: 250-50 | 20 days supply | Qty: 60 | Fill #0

## 2016-04-21 MED FILL — ?PREDNISONE 10 MG TABLET: 10 | 30 days supply | Qty: 30 | Fill #0

## 2016-04-21 NOTE — Progress Notes (Signed)
Patient is here for HFU  Patient denies pain at this time.  Patient has not taken medication today. Patient has eaten today.

## 2016-04-21 NOTE — Progress Notes (Signed)
Christina Miller, is a 50 y.o. female  PFX:[id number]  HGD:[id number]  DOB - 02-Apr-1966  CC:  Chief Complaint  Patient presents with  . Hospitalization Follow-up       HPI: Christina Miller is a 50 y.o. female with hx of Multiple sclerosis, COPD, and L leg pain here today to establish medical care and ED visit follow up. She was recently seen in our ED for L back pain that radiates down L leg for about a week. Had associated subjective weakness. She does not take any meds for multiple sclerosis. Neuro consulted and Dr. Nicole Kindred recommend MRI w/wo lumbar spine which showed disc extrusion with L 5 neuritis. No signs of MS exacerbation. She was discharged home with steroids and neurosurgery f/u. She is here to establish medical care. She needs refill of her inhalers for COPD and also requesting pain medications. She has appointment with Neurosurgery for evaluation and management. Patient has No headache, No chest pain, No abdominal pain - No Nausea, No new weakness tingling or numbness, No Cough - SOB.  care Allergies  Allergen Reactions  . Hydrocodone-Acetaminophen Nausea And Vomiting   Past Medical History:  Diagnosis Date  . Abnormal Pap smear    cryo  . Anemia   . Bone fracture    ankle  . COPD (chronic obstructive pulmonary disease) ([place])   . Emphysema   . Headache(784.0)   . Hypertension   . Infection    urinary tract infection  . MS (multiple sclerosis) ([place])    Current Outpatient Prescriptions on File Prior to Visit  Medication Sig Dispense Refill  . methylPREDNISolone (MEDROL DOSEPAK) 4 MG TBPK tablet Take as directed on box 21 tablet 0   No current facility-administered medications on file prior to visit.    Family History  Problem Relation Age of Onset  . Diabetes Father   . Diabetes Sister   . Hypertension Sister   . Diabetes Brother   . Hypertension Brother   . Other Neg Hx    Social History   Social History  . Marital status: Unknown    Spouse name: N/A    . Number of children: N/A  . Years of education: N/A   Occupational History  . Not on file.   Social History Main Topics  . Smoking status: Current Every Day Smoker    Types: Cigarettes  . Smokeless tobacco: Not on file  . Alcohol use Yes     Comment: couple times a wk  . Drug use: No  . Sexual activity: Yes    Birth control/ protection: Surgical   Other Topics Concern  . Not on file   Social History Narrative   ** Merged History Encounter **        Review of Systems: Constitutional: Negative for fever, chills, diaphoresis, activity change, appetite change and fatigue. HENT: Negative for ear pain, nosebleeds, congestion, facial swelling, rhinorrhea, neck pain, neck stiffness and ear discharge.  Eyes: Negative for pain, discharge, redness, itching and visual disturbance. Respiratory: Negative for cough, choking, chest tightness, shortness of breath, wheezing and stridor.  Cardiovascular: Negative for chest pain, palpitations and leg swelling. Gastrointestinal: Negative for abdominal distention. Genitourinary: Negative for dysuria, urgency, frequency, hematuria, flank pain, decreased urine volume, difficulty urinating and dyspareunia.  Musculoskeletal: Negative for back pain, joint swelling, arthralgia and gait problem. Neurological: Negative for dizziness, tremors, seizures, syncope, facial asymmetry, speech difficulty, weakness, light-headedness, numbness and headaches.  Hematological: Negative for adenopathy. Does not bruise/bleed easily. Psychiatric/Behavioral: Negative for hallucinations,  behavioral problems, confusion, dysphoric mood, decreased concentration and agitation.    Objective:   Vitals:   04/21/16 1617  BP: 116/77  Pulse: (!) 101  Resp: 18  Temp: 98.2 F (36.8 C)    Physical Exam: Constitutional: Patient appears well-developed and well-nourished. No distress. HENT: Normocephalic, atraumatic, External right and left ear normal. Oropharynx is clear and  moist.  Eyes: Conjunctivae and EOM are normal. PERRLA, no scleral icterus. Neck: Normal ROM. Neck supple. No JVD. No tracheal deviation. No thyromegaly. CVS: RRR, S1/S2 +, no murmurs, no gallops, no carotid bruit.  Pulmonary: Effort and breath sounds normal, no stridor, rhonchi, wheezes, rales.  Abdominal: Soft. BS +, no distension, tenderness, rebound or guarding.  Musculoskeletal: Normal range of motion. No edema and no tenderness.  Lymphadenopathy: No lymphadenopathy noted, cervical, inguinal or axillary Neuro: Alert. Normal reflexes, muscle tone coordination. No cranial nerve deficit. Skin: Skin is warm and dry. No rash noted. Not diaphoretic. No erythema. No pallor. Psychiatric: Normal mood and affect. Behavior, judgment, thought content normal.  Lab Results  Component Value Date   WBC 10.4 03/08/2016   HGB 13.5 03/08/2016   HCT 41.7 03/08/2016   MCV 99.5 03/08/2016   PLT 211 03/08/2016   Lab Results  Component Value Date   CREATININE 0.63 03/08/2016   BUN 10 03/08/2016   NA 135 03/08/2016   K 4.0 03/08/2016   CL 105 03/08/2016   CO2 22 03/08/2016    No results found for: HGBA1C Lipid Panel  No results found for: CHOL, TRIG, HDL, CHOLHDL, VLDL, LDLCALC     Assessment and plan:   1. COPD exacerbation (HCC)  - albuterol (PROVENTIL HFA;VENTOLIN HFA) 108 (90 Base) MCG/ACT inhaler; Inhale 1 puff into the lungs every 6 (six) hours as needed for wheezing or shortness of breath.  Dispense: 1 Inhaler; Refill: 3 - predniSONE (DELTASONE) 10 MG tablet; Take 1 tablet (10 mg total) by mouth daily.  Dispense: 30 tablet; Refill: 3 - Fluticasone-Salmeterol (ADVAIR) 250-50 MCG/DOSE AEPB; Inhale 1 puff into the lungs 2 (two) times daily.  Dispense: 60 each; Refill: 3  2. Protrusion of lumbar intervertebral disc  - acetaminophen-codeine (TYLENOL #3) 300-30 MG tablet; Take 1 tablet by mouth every 4 (four) hours as needed.  Dispense: 60 tablet; Refill: 0  3. Smoking addiction  Tamera  was counseled on the dangers of tobacco use, and was advised to quit. Reviewed strategies to maximize success, including removing cigarettes and smoking materials from environment, stress management and support of family/friends.  Return in about 3 months (around 07/22/2016) for COPD, Follow up Pain and comorbidities.  The patient was given clear instructions to go to ER or return to medical center if symptoms don't improve, worsen or new problems develop. The patient verbalized understanding. The patient was told to call to get lab results if they haven't heard anything in the next week.     This note has been created with Surveyor, quantity. Any transcriptional errors are unintentional.    Angelica Chessman, MD, [place], Karilyn Cota, [place], [place]   04/21/2016, 4:39 PM

## 2016-04-21 NOTE — Patient Instructions (Addendum)
Herniated Disk A herniated disk occurs when a disk in your spine bulges out too far. This condition is also called a ruptured disk or slipped disk. Your spine (backbone) is made up of bones called vertebrae. Between each pair of vertebrae is an oval disk with a soft, spongy center that acts as a shock absorber when you move. The spongy center is surrounded by a tough outer ring. When you have a herniated disk, the spongy center of the disk bulges out or ruptures through the outer ring. A herniated disk can press on a nerve between your vertebrae and cause pain. A herniated disk can occur anywhere in your back or neck area, but the lower back is the most common spot. CAUSES  In many cases, a herniated disk occurs just from getting older. As you age, the spongy insides of your disks tend to shrink and dry out. A herniated disk can result from gradual wear and tear. Injury or sudden strain can also cause a herniated disk.  RISK FACTORS Aging is the main risk factor for a herniated disk. Other risk factors include:  Being a man between the ages of 39 and 18 years.  Having a job that requires heavy lifting, bending, or twisting.  Having a job that requires long hours of driving.  Not getting enough exercise.  Being overweight.  Smoking. SIGNS AND SYMPTOMS  Signs and symptoms depend on which disk is herniated.  For a herniated disk in the lower back, you may have sharp pain in:  One part of your leg, hip, or buttocks.  The back of your calf.  The top or sole of your foot (sciatica).   For a herniated disk in the neck, you may feel pain:  When you move your neck.  Near or over your shoulder blade.  That moves to your upper arm, forearm, or fingers.   You may also have muscle weakness. It may be hard to:  Lift your leg or arm.  Stand on your toes.  Squeeze tightly with one of your hands.  Other symptoms can include:  Numbness or tingling in the affected areas of your  body.  Loss of bladder or bowel control. This is a rare but serious sign of a severe herniated disk in the lower back. DIAGNOSIS  Your health care provider will do a physical exam. During this exam, you may have to move certain body parts or assume various positions. For example, your health care provider may do the straight-leg test. This is a good way to test for a herniated disk in your lower back. In this test, the health care provider lifts your leg while you lie on your back. This is to see if you feel pain down your leg. Your health care provider will also check for numbness or loss of feeling.  Your health care provider will also check your:  Reflexes.  Muscle strength.  Posture.  Other tests may be done to help in making a diagnosis. These may include:  An X-ray of the spine to rule out other causes of back pain.   Other imaging studies, such as an MRI or CT scan. This is to check whether the herniated disk is pressing on your spinal canal.  Electromyography (EMG). This test checks the nerves that control muscles. It is sometimes used to identify the specific area of nerve involvement.  TREATMENT  In many cases, herniated disk symptoms go away over a period of days or weeks. You will most  likely be free of symptoms in 3-4 months. Treatment may include the following:  The initial treatment for a herniated disk is ashort period of rest.  Bed rest is often limited to 1 or 2 days. Resting for too long delays recovery.  If you have a herniated disk in your lower back, you should avoid sitting as much as possible because sitting increases pressure on the disk.  Medicines. These may include:   Nonsteroidal anti-inflammatory drugs (NSAIDs).  Muscle relaxants for back spasms.  Narcotic pain medicine if your pain is very bad.   Steroid injections. You may need these along the involved nerve root to help control pain. The steroid is injected in the area of the herniated disk.  It helps by reducing swelling around the disk.  Physical therapy. This may include exercises to strengthen the muscles that help support your spine.   You may need surgery if other treatments do not work.  HOME CARE INSTRUCTIONS Follow all your health care provider's instructions. These may include:  Take all medicines as directed by your health care provider.  Rest for 2 days and then start moving.  Do not sit or stand for long periods of time.  Maintain good posture when sitting and standing.  Avoid movements that cause pain, such as bending or lifting.  When you are able to start lifting things again:  Palmer with your knees.  Keep your back straight.  Hold heavy objects close to your body.  If you are overweight, ask your health care provider to help you start a weight-loss program.  When you are able to start exercising, ask your health care provider how much and what type of exercise is best for you.  Work with a physical therapist on stretching and strengthening exercises for your back.  Do not wear high-heeled shoes.  Do not sleep on your belly.  Do not smoke.  Keep all follow-up visits as directed by your health care provider. SEEK MEDICAL CARE IF:  You have back or neck pain that is not getting better after 4 weeks.  You have very bad pain in your back or neck.  You develop numbness, tingling, or weakness along with pain. SEEK IMMEDIATE MEDICAL CARE IF:   You have numbness, tingling, or weakness that makes you unable to use your arms or legs.  You lose control of your bladder or bowels.  You have dizziness or fainting.  You have shortness of breath.  MAKE SURE YOU:   Understand these instructions.  Will watch your condition.  Will get help right away if you are not doing well or get worse.   This information is not intended to replace advice given to you by your health care provider. Make sure you discuss any questions you have with your  health care provider.   Document Released: 07/01/2000 Document Revised: 07/25/2014 Document Reviewed: 06/07/2013 Elsevier Interactive Patient Education 2016 [employer] Chronic Obstructive Pulmonary Disease Chronic obstructive pulmonary disease (COPD) is a common lung condition in which airflow from the lungs is limited. COPD is a general term that can be used to describe many different lung problems that limit airflow, including both chronic bronchitis and emphysema. If you have COPD, your lung function will probably never return to normal, but there are measures you can take to improve lung function and make yourself feel better. CAUSES   Smoking (common).  Exposure to secondhand smoke.  Genetic problems.  Chronic inflammatory lung diseases or recurrent infections. SYMPTOMS  Shortness of  breath, especially with physical activity.  Deep, persistent (chronic) cough with a large amount of thick mucus.  Wheezing.  Rapid breaths (tachypnea).  Gray or bluish discoloration (cyanosis) of the skin, especially in your fingers, toes, or lips.  Fatigue.  Weight loss.  Frequent infections or episodes when breathing symptoms become much worse (exacerbations).  Chest tightness. DIAGNOSIS Your health care provider will take a medical history and perform a physical examination to diagnose COPD. Additional tests for COPD may include:  Lung (pulmonary) function tests.  Chest X-ray.  CT scan.  Blood tests. TREATMENT  Treatment for COPD may include:  Inhaler and nebulizer medicines. These help manage the symptoms of COPD and make your breathing more comfortable.  Supplemental oxygen. Supplemental oxygen is only helpful if you have a low oxygen level in your blood.  Exercise and physical activity. These are beneficial for nearly all people with COPD.  Lung surgery or transplant.  Nutrition therapy to gain weight, if you are underweight.  Pulmonary rehabilitation. This may  involve working with a team of health care providers and specialists, such as respiratory, occupational, and physical therapists. HOME CARE INSTRUCTIONS  Take all medicines (inhaled or pills) as directed by your health care provider.  Avoid over-the-counter medicines or cough syrups that dry up your airway (such as antihistamines) and slow down the elimination of secretions unless instructed otherwise by your health care provider.  If you are a smoker, the most important thing that you can do is stop smoking. Continuing to smoke will cause further lung damage and breathing trouble. Ask your health care provider for help with quitting smoking. He or she can direct you to community resources or hospitals that provide support.  Avoid exposure to irritants such as smoke, chemicals, and fumes that aggravate your breathing.  Use oxygen therapy and pulmonary rehabilitation if directed by your health care provider. If you require home oxygen therapy, ask your health care provider whether you should purchase a pulse oximeter to measure your oxygen level at home.  Avoid contact with individuals who have a contagious illness.  Avoid extreme temperature and humidity changes.  Eat healthy foods. Eating smaller, more frequent meals and resting before meals may help you maintain your strength.  Stay active, but balance activity with periods of rest. Exercise and physical activity will help you maintain your ability to do things you want to do.  Preventing infection and hospitalization is very important when you have COPD. Make sure to receive all the vaccines your health care provider recommends, especially the pneumococcal and influenza vaccines. Ask your health care provider whether you need a pneumonia vaccine.  Learn and use relaxation techniques to manage stress.  Learn and use controlled breathing techniques as directed by your health care provider. Controlled breathing techniques include:  Pursed  lip breathing. Start by breathing in (inhaling) through your nose for 1 second. Then, purse your lips as if you were going to whistle and breathe out (exhale) through the pursed lips for 2 seconds.  Diaphragmatic breathing. Start by putting one hand on your abdomen just above your waist. Inhale slowly through your nose. The hand on your abdomen should move out. Then purse your lips and exhale slowly. You should be able to feel the hand on your abdomen moving in as you exhale.  Learn and use controlled coughing to clear mucus from your lungs. Controlled coughing is a series of short, progressive coughs. The steps of controlled coughing are: 1. Lean your head slightly forward. 2.  Breathe in deeply using diaphragmatic breathing. 3. Try to hold your breath for 3 seconds. 4. Keep your mouth slightly open while coughing twice. 5. Spit any mucus out into a tissue. 6. Rest and repeat the steps once or twice as needed. SEEK MEDICAL CARE IF:  You are coughing up more mucus than usual.  There is a change in the color or thickness of your mucus.  Your breathing is more labored than usual.  Your breathing is faster than usual. SEEK IMMEDIATE MEDICAL CARE IF:  You have shortness of breath while you are resting.  You have shortness of breath that prevents you from:  Being able to talk.  Performing your usual physical activities.  You have chest pain lasting longer than 5 minutes.  Your skin color is more cyanotic than usual.  You measure low oxygen saturations for longer than 5 minutes with a pulse oximeter. MAKE SURE YOU:  Understand these instructions.  Will watch your condition.  Will get help right away if you are not doing well or get worse.   This information is not intended to replace advice given to you by your health care provider. Make sure you discuss any questions you have with your health care provider.   Document Released: 04/13/2005 Document Revised: 07/25/2014 Document  Reviewed: 02/28/2013 Elsevier Interactive Patient Education 2016 [employer]. Smoking Cessation, Tips for Success If you are ready to quit smoking, congratulations! You have chosen to help yourself be healthier. Cigarettes bring nicotine, tar, carbon monoxide, and other irritants into your body. Your lungs, heart, and blood vessels will be able to work better without these poisons. There are many different ways to quit smoking. Nicotine gum, nicotine patches, a nicotine inhaler, or nicotine nasal spray can help with physical craving. Hypnosis, support groups, and medicines help break the habit of smoking. WHAT THINGS CAN I DO TO MAKE QUITTING EASIER?  Here are some tips to help you quit for good:  Pick a date when you will quit smoking completely. Tell all of your friends and family about your plan to quit on that date.  Do not try to slowly cut down on the number of cigarettes you are smoking. Pick a quit date and quit smoking completely starting on that day.  Throw away all cigarettes.   Clean and remove all ashtrays from your home, work, and car.  On a card, write down your reasons for quitting. Carry the card with you and read it when you get the urge to smoke.  Cleanse your body of nicotine. Drink enough water and fluids to keep your urine clear or pale yellow. Do this after quitting to flush the nicotine from your body.  Learn to predict your moods. Do not let a bad situation be your excuse to have a cigarette. Some situations in your life might tempt you into wanting a cigarette.  Never have "just one" cigarette. It leads to wanting another and another. Remind yourself of your decision to quit.  Change habits associated with smoking. If you smoked while driving or when feeling stressed, try other activities to replace smoking. Stand up when drinking your coffee. Brush your teeth after eating. Sit in a different chair when you read the paper. Avoid alcohol while trying to quit, and  try to drink fewer caffeinated beverages. Alcohol and caffeine may urge you to smoke.  Avoid foods and drinks that can trigger a desire to smoke, such as sugary or spicy foods and alcohol.  Ask people who smoke  not to smoke around you.  Have something planned to do right after eating or having a cup of coffee. For example, plan to take a walk or exercise.  Try a relaxation exercise to calm you down and decrease your stress. Remember, you may be tense and nervous for the first 2 weeks after you quit, but this will pass.  Find new activities to keep your hands busy. Play with a pen, coin, or rubber band. Doodle or draw things on paper.  Brush your teeth right after eating. This will help cut down on the craving for the taste of tobacco after meals. You can also try mouthwash.   Use oral substitutes in place of cigarettes. Try using lemon drops, carrots, cinnamon sticks, or chewing gum. Keep them handy so they are available when you have the urge to smoke.  When you have the urge to smoke, try deep breathing.  Designate your home as a nonsmoking area.  If you are a heavy smoker, ask your health care provider about a prescription for nicotine chewing gum. It can ease your withdrawal from nicotine.  Reward yourself. Set aside the cigarette money you save and buy yourself something nice.  Look for support from others. Join a support group or smoking cessation program. Ask someone at home or at work to help you with your plan to quit smoking.  Always ask yourself, "Do I need this cigarette or is this just a reflex?" Tell yourself, "Today, I choose not to smoke," or "I do not want to smoke." You are reminding yourself of your decision to quit.  Do not replace cigarette smoking with electronic cigarettes (commonly called e-cigarettes). The safety of e-cigarettes is unknown, and some may contain harmful chemicals.  If you relapse, do not give up! Plan ahead and think about what you will do the  next time you get the urge to smoke. HOW WILL I FEEL WHEN I QUIT SMOKING? You may have symptoms of withdrawal because your body is used to nicotine (the addictive substance in cigarettes). You may crave cigarettes, be irritable, feel very hungry, cough often, get headaches, or have difficulty concentrating. The withdrawal symptoms are only temporary. They are strongest when you first quit but will go away within 10-14 days. When withdrawal symptoms occur, stay in control. Think about your reasons for quitting. Remind yourself that these are signs that your body is healing and getting used to being without cigarettes. Remember that withdrawal symptoms are easier to treat than the major diseases that smoking can cause.  Even after the withdrawal is over, expect periodic urges to smoke. However, these cravings are generally short lived and will go away whether you smoke or not. Do not smoke! WHAT RESOURCES ARE AVAILABLE TO HELP ME QUIT SMOKING? Your health care provider can direct you to community resources or hospitals for support, which may include:  Group support.  Education.  Hypnosis.  Therapy.   This information is not intended to replace advice given to you by your health care provider. Make sure you discuss any questions you have with your health care provider.   Document Released: 04/01/2004 Document Revised: 07/25/2014 Document Reviewed: 12/20/2012 Elsevier Interactive Patient Education [employer].

## 2016-09-12 ENCOUNTER — Inpatient Hospital Stay (HOSPITAL_COMMUNITY)
Admission: AD | Admit: 2016-09-12 | Discharge: 2016-09-12 | Disposition: A | Discharge status: Home / Self Care | Payer: Self-pay | Source: Ambulatory Visit | Attending: Obstetrics and Gynecology | Admitting: Obstetrics and Gynecology

## 2016-09-12 ENCOUNTER — Encounter (HOSPITAL_COMMUNITY): Payer: Self-pay

## 2016-09-12 DIAGNOSIS — F17210 Nicotine dependence, cigarettes, uncomplicated: Secondary | ICD-10-CM | POA: Insufficient documentation

## 2016-09-12 DIAGNOSIS — R300 Dysuria: Secondary | ICD-10-CM | POA: Insufficient documentation

## 2016-09-12 DIAGNOSIS — N10 Acute tubulo-interstitial nephritis: Secondary | ICD-10-CM | POA: Insufficient documentation

## 2016-09-12 LAB — CBC WITH DIFFERENTIAL/PLATELET
Basophils Absolute: 0 10*3/uL (ref 0.0–0.1)
Basophils Relative: 0 %
EOS ABS: 0.1 10*3/uL (ref 0.0–0.7)
EOS PCT: 1 %
HCT: 44.4 % (ref 36.0–46.0)
HEMOGLOBIN: 15.1 g/dL — AB (ref 12.0–15.0)
LYMPHS ABS: 2.4 10*3/uL (ref 0.7–4.0)
Lymphocytes Relative: 17 %
MCH: 31.8 pg (ref 26.0–34.0)
MCHC: 34 g/dL (ref 30.0–36.0)
MCV: 93.5 fL (ref 78.0–100.0)
Monocytes Absolute: 1.5 10*3/uL — ABNORMAL HIGH (ref 0.1–1.0)
Monocytes Relative: 11 %
Neutro Abs: 9.8 10*3/uL — ABNORMAL HIGH (ref 1.7–7.7)
Neutrophils Relative %: 71 %
Platelets: 211 10*3/uL (ref 150–400)
RBC: 4.75 MIL/uL (ref 3.87–5.11)
RDW: 13.4 % (ref 11.5–15.5)
WBC: 13.9 10*3/uL — ABNORMAL HIGH (ref 4.0–10.5)

## 2016-09-12 LAB — URINALYSIS, ROUTINE W REFLEX MICROSCOPIC
BILIRUBIN URINE: NEGATIVE
Glucose, UA: NEGATIVE mg/dL
Ketones, ur: NEGATIVE mg/dL
Nitrite: NEGATIVE
PH: 5 (ref 5.0–8.0)
Protein, ur: 30 mg/dL — AB
SPECIFIC GRAVITY, URINE: 1.016 (ref 1.005–1.030)

## 2016-09-12 MED ORDER — PHENAZOPYRIDINE HCL 200 MG PO TABS: 200.0000 mg | ORAL_TABLET | Freq: Once | ORAL | 0 refills | Status: AC

## 2016-09-12 MED ORDER — PHENAZOPYRIDINE HCL 200 MG PO TABS: 200.0000 mg | ORAL_TABLET | Freq: Three times a day (TID) | ORAL | 0 refills | Status: DC

## 2016-09-12 MED ORDER — CIPROFLOXACIN HCL 500 MG PO TABS
500.0000 mg | ORAL_TABLET | Freq: Two times a day (BID) | ORAL | 0 refills | Status: DC
Start: 2016-09-12 — End: 2016-09-14

## 2016-09-12 MED ORDER — PHENAZOPYRIDINE HCL 100 MG PO TABS
200.0000 mg | ORAL_TABLET | Freq: Once | ORAL | Status: AC
  Administered 2016-09-12: 200 mg via ORAL
  Filled 2016-09-12: qty 2

## 2016-09-12 NOTE — MAU Note (Signed)
+   left CVA tenderness

## 2016-09-12 NOTE — Discharge Instructions (Signed)
Dysuria Introduction Dysuria is pain or discomfort while urinating. The pain or discomfort may be felt in the tube that carries urine out of the bladder (urethra) or in the surrounding tissue of the genitals. The pain may also be felt in the groin area, lower abdomen, and lower back. You may have to urinate frequently or have the sudden feeling that you have to urinate (urgency). Dysuria can affect both men and women, but is more common in women. Dysuria can be caused by many different things, including:  Urinary tract infection in women.  Infection of the kidney or bladder.  Kidney stones or bladder stones.  Certain sexually transmitted infections (STIs), such as chlamydia.  Dehydration.  Inflammation of the vagina.  Use of certain medicines.  Use of certain soaps or scented products that cause irritation. Follow these instructions at home: Watch your dysuria for any changes. The following actions may help to reduce any discomfort you are feeling:  Drink enough fluid to keep your urine clear or pale yellow.  Empty your bladder often. Avoid holding urine for long periods of time.  After a bowel movement or urination, women should cleanse from front to back, using each tissue only once.  Empty your bladder after sexual intercourse.  Take medicines only as directed by your health care provider.  If you were prescribed an antibiotic medicine, finish it all even if you start to feel better.  Avoid caffeine, tea, and alcohol. They can irritate the bladder and make dysuria worse. In men, alcohol may irritate the prostate.  Keep all follow-up visits as directed by your health care provider. This is important.  If you had any tests done to find the cause of dysuria, it is your responsibility to obtain your test results. Ask the lab or department performing the test when and how you will get your results. Talk with your health care provider if you have any questions about your  results. Contact a health care provider if:  You develop pain in your back or sides.  You have a fever.  You have nausea or vomiting.  You have blood in your urine.  You are not urinating as often as you usually do. Get help right away if:  You pain is severe and not relieved with medicines.  You are unable to hold down any fluids.  You or someone else notices a change in your mental function.  You have a rapid heartbeat at rest.  You have shaking or chills.  You feel extremely weak. This information is not intended to replace advice given to you by your health care provider. Make sure you discuss any questions you have with your health care provider. Document Released: 04/01/2004 Document Revised: 12/10/2015 Document Reviewed: 02/27/2014  2017 Elsevier   Pyelonephritis, Adult Introduction Pyelonephritis is a kidney infection. The kidneys are organs that help clean your blood by moving waste out of your blood and into your pee (urine). This infection can happen quickly, or it can last for a long time. In most cases, it clears up with treatment and does not cause other problems. Follow these instructions at home: Medicines  Take over-the-counter and prescription medicines only as told by your doctor.  Take your antibiotic medicine as told by your doctor. Do not stop taking the medicine even if you start to feel better. General instructions  Drink enough fluid to keep your pee clear or pale yellow.  Avoid caffeine, tea, and carbonated drinks.  Pee (urinate) often. Avoid holding in  pee for long periods of time.  Pee before and after sex.  After pooping (having a bowel movement), women should wipe from front to back. Use each tissue only once.  Keep all follow-up visits as told by your doctor. This is important. Contact a doctor if:  You do not feel better after 2 days.  Your symptoms get worse.  You have a fever. Get help right away if:  You cannot take your  medicine or drink fluids as told.  You have chills and shaking.  You throw up (vomit).  You have very bad pain in your side (flank) or back.  You feel very weak or you pass out (faint). This information is not intended to replace advice given to you by your health care provider. Make sure you discuss any questions you have with your health care provider. Document Released: 08/11/2004 Document Revised: 12/10/2015 Document Reviewed: 10/27/2014  2017 Elsevier

## 2016-09-12 NOTE — MAU Provider Note (Signed)
History     CSN: [id number]  Arrival date and time: 09/12/16 1208   First Provider Initiated Contact with Patient 09/12/16 1502      No chief complaint on file.  HPI   [name] is a 51 y.o. non pregnant female [phone] here with dysuria, and throbbing in her vagina.  No strong history of UTI.  OB History    Gravida Para Term Preterm AB Living   7 2 1 1 5 2    SAB TAB Ectopic Multiple Live Births   3 2     1       Past Medical History:  Diagnosis Date  . Abnormal Pap smear    cryo  . Anemia   . Bone fracture    ankle  . COPD (chronic obstructive pulmonary disease) ([place])   . Emphysema   . Headache(784.0)   . Hypertension   . Infection    urinary tract infection  . MS (multiple sclerosis) ([place])     Past Surgical History:  Procedure Laterality Date  . ARM WOUND REPAIR / CLOSURE    . DILATION AND CURETTAGE OF UTERUS    . TUBAL LIGATION      Family History  Problem Relation Age of Onset  . Diabetes Father   . Diabetes Sister   . Hypertension Sister   . Diabetes Brother   . Hypertension Brother   . Other Neg Hx     Social History  Substance Use Topics  . Smoking status: Current Every Day Smoker    Packs/day: 1.00    Types: Cigarettes  . Smokeless tobacco: Former Systems developer  . Alcohol use Yes     Comment: couple times a wk    Allergies:  Allergies  Allergen Reactions  . Hydrocodone-Acetaminophen Nausea And Vomiting    Prescriptions Prior to Admission  Medication Sig Dispense Refill Last Dose  . ibuprofen (ADVIL,MOTRIN) 200 MG tablet Take 800 mg by mouth every 6 (six) hours as needed for moderate pain or cramping.   09/11/2016 at Unknown time  . acetaminophen-codeine (TYLENOL #3) 300-30 MG tablet Take 1 tablet by mouth every 4 (four) hours as needed. (Patient not taking: Reported on 09/12/2016) 60 tablet 0 Completed Course at Unknown time  . albuterol (PROVENTIL HFA;VENTOLIN HFA) 108 (90 Base) MCG/ACT inhaler Inhale 1 puff into the lungs every 6 (six)  hours as needed for wheezing or shortness of breath. (Patient not taking: Reported on 09/12/2016) 1 Inhaler 3 Completed Course at Unknown time  . Fluticasone-Salmeterol (ADVAIR) 250-50 MCG/DOSE AEPB Inhale 1 puff into the lungs 2 (two) times daily. (Patient not taking: Reported on 09/12/2016) 60 each 3 Completed Course at Unknown time  . predniSONE (DELTASONE) 10 MG tablet Take 1 tablet (10 mg total) by mouth daily. (Patient not taking: Reported on 09/12/2016) 30 tablet 3 Completed Course at Unknown time   Results for orders placed or performed during the hospital encounter of 09/12/16 (from the past 48 hour(s))  Urine culture     Status: Abnormal (Preliminary result)   Collection Time: 09/12/16 12:25 PM  Result Value Ref Range   Specimen Description URINE, CLEAN CATCH    Special Requests NONE    Culture 50,000 COLONIES/mL ESCHERICHIA COLI (A)    Report Status PENDING   Urinalysis, Routine w reflex microscopic     Status: Abnormal   Collection Time: 09/12/16 12:27 PM  Result Value Ref Range   Color, Urine YELLOW YELLOW   APPearance HAZY (A) CLEAR   Specific Gravity, Urine 1.016  1.005 - 1.030   pH 5.0 5.0 - 8.0   Glucose, UA NEGATIVE NEGATIVE mg/dL   Hgb urine dipstick MODERATE (A) NEGATIVE   Bilirubin Urine NEGATIVE NEGATIVE   Ketones, ur NEGATIVE NEGATIVE mg/dL   Protein, ur 30 (A) NEGATIVE mg/dL   Nitrite NEGATIVE NEGATIVE   Leukocytes, UA LARGE (A) NEGATIVE   RBC / HPF 6-30 0 - 5 RBC/hpf   WBC, UA TOO NUMEROUS TO COUNT 0 - 5 WBC/hpf   Bacteria, UA RARE (A) NONE SEEN   Squamous Epithelial / LPF 0-5 (A) NONE SEEN   Mucous PRESENT   CBC with Differential     Status: Abnormal   Collection Time: 09/12/16  2:43 PM  Result Value Ref Range   WBC 13.9 (H) 4.0 - 10.5 K/uL   RBC 4.75 3.87 - 5.11 MIL/uL   Hemoglobin 15.1 (H) 12.0 - 15.0 g/dL   HCT 44.4 36.0 - 46.0 %   MCV 93.5 78.0 - 100.0 fL   MCH 31.8 26.0 - 34.0 pg   MCHC 34.0 30.0 - 36.0 g/dL   RDW 13.4 11.5 - 15.5 %   Platelets 211  150 - 400 K/uL   Neutrophils Relative % 71 %   Neutro Abs 9.8 (H) 1.7 - 7.7 K/uL   Lymphocytes Relative 17 %   Lymphs Abs 2.4 0.7 - 4.0 K/uL   Monocytes Relative 11 %   Monocytes Absolute 1.5 (H) 0.1 - 1.0 K/uL   Eosinophils Relative 1 %   Eosinophils Absolute 0.1 0.0 - 0.7 K/uL   Basophils Relative 0 %   Basophils Absolute 0.0 0.0 - 0.1 K/uL   Review of Systems  Constitutional: Negative for fever.  Genitourinary: Positive for dysuria, frequency and urgency.   Physical Exam   Blood pressure 123/89, pulse 107, temperature 98.2 F (36.8 C), resp. rate 18, weight 126 lb 4 oz (57.3 kg), last menstrual period 06/20/2012.  Physical Exam  Constitutional: She is oriented to person, place, and time. She appears well-developed and well-nourished. No distress.  HENT:  Head: Normocephalic.  GI: There is tenderness in the suprapubic area. There is CVA tenderness (Very mild, Left CVA tenderness ). There is no rigidity, no rebound and no guarding.  Musculoskeletal: Normal range of motion.  Neurological: She is alert and oriented to person, place, and time.  Skin: Skin is warm. She is not diaphoretic.    MAU Course  Procedures  None  MDM  UA  Pyridium given PO Discussed patient with Dr. Elly Modena. Ok to treat Outpatient with strict return precautions. Urine culture sent and pending    Assessment and Plan   A:  1. Acute pyelonephritis   2. Dysuria     P:  Discharge home in stable condition Rx: Cipro, pyridium Strict return precautions, patient to return if symptoms are not improving with outpatient meds  Lezlie Lye, NP 09/14/2016 9:37 AM

## 2016-09-12 NOTE — MAU Note (Signed)
Doesn't know if she has a kidney, bladder or urinary tract infection. Bloating, spasms when she would pee  Last wkd, got to where she had to pee, but couldn't. So she laid of the drinks, pushed water and started drinking cranberry juice.   Had some problems controlling bladder.   Lower legs and low back started hurting this morning. Having cold chills.

## 2016-09-14 ENCOUNTER — Telehealth: Payer: Self-pay | Admitting: Obstetrics and Gynecology

## 2016-09-14 LAB — URINE CULTURE: Culture: 50000 — AB

## 2016-09-14 MED ORDER — SULFAMETHOXAZOLE-TRIMETHOPRIM 800-160 MG PO TABS: 1.0000 | ORAL_TABLET | Freq: Two times a day (BID) | ORAL | 0 refills | Status: DC

## 2016-09-14 NOTE — Telephone Encounter (Signed)
Patient not feeling any better. Denies fever.  Discussed changing antibiotics due to culture results. Patient to stop Cipro and start bactrim. RX to pharmacy. Discussed with Dr. Kennon Rounds. Bactrim BID X 7 days. I will call patient tomorrow and check on her. If no improvement will plan to bring patient in for IV rocephin.    Lezlie Lye, NP

## 2016-09-14 NOTE — Telephone Encounter (Signed)
Patient seen in MAU with acute pyelonephritis/ complicated UTI. Patient sent home on Cipro. Urine culture resistant to cipro. Attempted to call patient to access symptoms and discuss changing antibiotic. Requested a call back to the [facility] before noon [phone] or MAU office after [phone]. Thanks  Noni Saupe NP

## 2016-09-15 ENCOUNTER — Other Ambulatory Visit: Payer: Self-pay | Admitting: Student

## 2016-09-15 ENCOUNTER — Telehealth: Payer: Self-pay | Admitting: Student

## 2016-09-15 MED ORDER — PHENAZOPYRIDINE HCL 200 MG PO TABS: 200.0000 mg | ORAL_TABLET | Freq: Three times a day (TID) | ORAL | 0 refills | Status: DC | PRN

## 2016-09-15 NOTE — Progress Notes (Signed)
See telephone call documentation

## 2016-09-15 NOTE — Telephone Encounter (Signed)
Received telephone call from patient who says she needs a refill on her pyridium. Refill for 15 tabs sent to pharmacy on file. Patient's antibiotic was just switched; she has now started taking her bactrim. She knows to return to the MAU if she starts feeling worse.

## 2016-11-11 ENCOUNTER — Other Ambulatory Visit: Payer: Self-pay | Admitting: Obstetrics and Gynecology

## 2016-11-11 DIAGNOSIS — Z1231 Encounter for screening mammogram for malignant neoplasm of breast: Secondary | ICD-10-CM

## 2016-12-01 ENCOUNTER — Ambulatory Visit (HOSPITAL_COMMUNITY): Payer: Self-pay

## 2016-12-06 ENCOUNTER — Ambulatory Visit (HOSPITAL_COMMUNITY)
Admission: RE | Admit: 2016-12-06 | Discharge: 2016-12-06 | Disposition: A | Discharge status: Home / Self Care | Payer: Self-pay | Source: Ambulatory Visit | Attending: Obstetrics and Gynecology | Admitting: Obstetrics and Gynecology

## 2016-12-06 ENCOUNTER — Ambulatory Visit
Admission: RE | Admit: 2016-12-06 | Discharge: 2016-12-06 | Disposition: A | Discharge status: Home / Self Care | Payer: No Typology Code available for payment source | Source: Ambulatory Visit | Attending: Obstetrics and Gynecology | Admitting: Obstetrics and Gynecology

## 2016-12-06 ENCOUNTER — Encounter (HOSPITAL_COMMUNITY): Payer: Self-pay

## 2016-12-06 VITALS — BP 124/82 | Temp 97.9°F | Ht 62.0 in | Wt 129.0 lb

## 2016-12-06 DIAGNOSIS — Z1231 Encounter for screening mammogram for malignant neoplasm of breast: Secondary | ICD-10-CM

## 2016-12-06 DIAGNOSIS — Z01419 Encounter for gynecological examination (general) (routine) without abnormal findings: Secondary | ICD-10-CM

## 2016-12-06 NOTE — Patient Instructions (Addendum)
Explained breast self awareness with Marzella Schlein. Let patient know that if today's Pap smear is normal that her next Pap smear will be due in one year due to her recent history of an abnormal Pap smear. Referred patient to the [place] for a screening mammogram. Appointment scheduled for Tuesday, Dec 06, 2016 at 1440. Let patient know the [facility] will follow up with her within the next couple weeks with results of mammogram by letter or phone. Discussed smoking cessation with patient. Referred to the [facility] Quitline and gave resources to the free smoking cessation classes at [facility]. Marzella Schlein verbalized understanding.  Hank Walling, Arvil Chaco, RN 2:17 PM

## 2016-12-06 NOTE — Progress Notes (Signed)
No complaints today.   Pap Smear: Pap smear completed today. Last Pap smear was 12/04/2012 at [facility]. A colposcopy was completed for follow up 04/22/2013 that was insufficient. A LEEP was recommended for follow-up and patient never scheduled. Per patient she has had frequent abnormal Pap smears. Per patient had cryo around 10 years ago for an abnormal Pap smear. Last two Pap smear results and last colposcopy result is in EPIC.Marland Kitchen       Physical exam: Breasts Breasts symmetrical. No skin abnormalities bilateral breasts. No nipple retraction bilateral breasts. No nipple discharge bilateral breasts. No lymphadenopathy. No lumps palpated bilateral breasts. No complaints of pain or tenderness on exam. Referred patient to the [place] for a screening mammogram. Appointment scheduled for Tuesday, Dec 06, 2016 at 1440.  Pelvic/Bimanual   Ext Genitalia No lesions, no swelling and no discharge observed on external genitalia.         Vagina Vagina pink and normal texture. No lesions or discharge observed in vagina.          Cervix Cervix is present. Cervix pink and of normal texture. No discharge observed.     Uterus Uterus is present and palpable. Uterus in normal position and normal size.        Adnexae Bilateral ovaries present and palpable. No tenderness on palpation.          Rectovaginal No rectal exam completed today since patient had no rectal complaints. No skin abnormalities observed on exam.    Smoking History: Patient is a current smoker. Discussed smoking cessation with patient. Referred to the [facility] Quitline and gave resources to the free smoking cessation classes at [facility].  Patient Navigation: Patient education provided. Access to services provided for patient through [place] program.   Colorectal Cancer Screening: Per patient has never had a colonoscopy. No complaints today. FIT Test given to patient to complete and return to [facility].

## 2016-12-09 LAB — CYTOLOGY - PAP
DIAGNOSIS: NEGATIVE
HPV: NOT DETECTED

## 2016-12-10 ENCOUNTER — Other Ambulatory Visit: Payer: Self-pay

## 2016-12-13 ENCOUNTER — Encounter (HOSPITAL_COMMUNITY): Payer: Self-pay | Admitting: *Deleted

## 2016-12-13 ENCOUNTER — Telehealth (HOSPITAL_COMMUNITY): Payer: Self-pay | Admitting: *Deleted

## 2016-12-13 NOTE — Telephone Encounter (Signed)
Telephoned patient at home number and advised patient of negative pap smear results. HPV was negative. Next pap smear due in one year due to the history of abnormal pap smears.

## 2016-12-15 LAB — FECAL OCCULT BLOOD, IMMUNOCHEMICAL: Fecal Occult Bld: NEGATIVE

## 2016-12-19 ENCOUNTER — Encounter (HOSPITAL_COMMUNITY): Payer: Self-pay | Admitting: *Deleted

## 2017-01-12 ENCOUNTER — Encounter (HOSPITAL_COMMUNITY): Payer: Self-pay | Admitting: Emergency Medicine

## 2017-01-12 ENCOUNTER — Ambulatory Visit (HOSPITAL_COMMUNITY)
Admission: EM | Admit: 2017-01-12 | Discharge: 2017-01-12 | Disposition: A | Discharge status: Home / Self Care | Payer: No Typology Code available for payment source | Attending: Physician Assistant | Admitting: Physician Assistant

## 2017-01-12 DIAGNOSIS — J40 Bronchitis, not specified as acute or chronic: Secondary | ICD-10-CM

## 2017-01-12 MED ORDER — PREDNISONE 10 MG (21) PO TBPK: ORAL_TABLET | Freq: Every day | ORAL | 0 refills | Status: DC

## 2017-01-12 MED ORDER — AZITHROMYCIN 500 MG PO TABS: 500.0000 mg | ORAL_TABLET | Freq: Every day | ORAL | 0 refills | Status: AC

## 2017-01-12 NOTE — Discharge Instructions (Signed)
Start Azithromycin 544m daily for 3 days. Start Prednisone pack as directed. Continue to use albuterol for wheezing. Can get a spacer from the pharmacy if you are having trouble using the inhaler. Monitor for fever, trouble breathing. We encourage you to stop smoking, it can exacerbate your symptoms.

## 2017-01-12 NOTE — ED Triage Notes (Signed)
4 week history of symptoms.  Initially was coughing up a lot of phlegm.  Continue coughing patient says she is having a hard time breathing.  Speaking in complete sentences.  Unknown if running fever, having cold chills

## 2017-01-12 NOTE — ED Provider Notes (Signed)
CSN: [id number]     Arrival date & time 01/12/17  8786 History   None    Chief Complaint  Patient presents with  . URI   (Consider location/radiation/quality/duration/timing/severity/associated sxs/prior Treatment) 51 yo female with PMH of COPD, MS, HTN comes in for 4 week history of coughing and congestion. She has been having productive cough, and has been coughing up a lot of yellow discharge the past few days. She has had some diarrhea. Some sore throat due to all the coughing. She has felt difficulty breathing due to the coughing. She has been using her albuterol inhaler with good relief. Has also tried some allergy medicine with mild relief. Denies fever, chills, night sweats. Denies abdominal pain, N/V, blood in stool. Denies chest pain, palpitations. Denies wheezing, shortness of breath. Denies ear/eye pain. She continues to smoke every day.       Past Medical History:  Diagnosis Date  . Abnormal Pap smear    cryo  . Anemia   . Bone fracture    ankle  . COPD (chronic obstructive pulmonary disease) ([place])   . Emphysema   . Headache(784.0)   . Hypertension   . Infection    urinary tract infection  . MS (multiple sclerosis) ([place])    Past Surgical History:  Procedure Laterality Date  . ARM WOUND REPAIR / CLOSURE    . BREAST BIOPSY Left 07/03/2012  . COLPOSCOPY    . DILATION AND CURETTAGE OF UTERUS    . TUBAL LIGATION     Family History  Problem Relation Age of Onset  . Diabetes Father   . Diabetes Sister   . Hypertension Sister   . Diabetes Brother   . Hypertension Brother   . Other Neg Hx    Social History  Substance Use Topics  . Smoking status: Current Every Day Smoker    Packs/day: 1.00    Types: Cigarettes  . Smokeless tobacco: Former Systems developer  . Alcohol use Yes     Comment: Quit Sep 02, 2016   OB History    Gravida Para Term Preterm AB Living   7 2 1 1 5 2    SAB TAB Ectopic Multiple Live Births   3 2     1      Review of Systems  Constitutional:  Negative for chills, diaphoresis, fatigue and fever.  HENT: Positive for congestion, postnasal drip, rhinorrhea and sore throat. Negative for ear discharge, ear pain, facial swelling, sinus pain, sinus pressure, sneezing and trouble swallowing.   Eyes: Negative for photophobia, pain, discharge, redness, itching and visual disturbance.  Respiratory: Positive for cough. Negative for apnea, choking, shortness of breath and wheezing.   Cardiovascular: Negative for chest pain and palpitations.  Gastrointestinal: Positive for diarrhea. Negative for abdominal pain, blood in stool, nausea and vomiting.    Allergies  Hydrocodone-acetaminophen  Home Medications   Prior to Admission medications   Medication Sig Start Date End Date Taking? Authorizing Provider  albuterol (PROVENTIL HFA;VENTOLIN HFA) 108 (90 Base) MCG/ACT inhaler Inhale 1 puff into the lungs every 6 (six) hours as needed for wheezing or shortness of breath. Patient not taking: Reported on 09/12/2016 04/21/16   Tresa Garter, MD  azithromycin (ZITHROMAX) 500 MG tablet Take 1 tablet (500 mg total) by mouth daily. 01/12/17 01/15/17  Ok Edwards, PA-C  Fluticasone-Salmeterol (ADVAIR) 250-50 MCG/DOSE AEPB Inhale 1 puff into the lungs 2 (two) times daily. Patient not taking: Reported on 09/12/2016 04/21/16   Tresa Garter, MD  ibuprofen (ADVIL,MOTRIN)  200 MG tablet Take 800 mg by mouth every 6 (six) hours as needed for moderate pain or cramping.    [provider]  phenazopyridine (PYRIDIUM) 200 MG tablet Take 1 tablet (200 mg total) by mouth 3 (three) times daily. Patient not taking: Reported on 12/06/2016 09/12/16   Rasch, Anderson [place] I, NP  phenazopyridine (PYRIDIUM) 200 MG tablet Take 1 tablet (200 mg total) by mouth 3 (three) times daily as needed for pain. Patient not taking: Reported on 12/06/2016 09/15/16   Starr Lake, CNM  predniSONE (STERAPRED UNI-PAK 21 TAB) 10 MG (21) TBPK tablet Take by mouth daily. Take 6  tabs by mouth day 1, then 5 tabs, then 4 tabs, then 3 tabs, 2 tabs, then 1 tab for the last day 01/12/17   Ok Edwards, PA-C  sertraline (ZOLOFT) 50 MG tablet Take 50 mg by mouth daily.    [provider]   Meds Ordered and Administered this Visit  Medications - No data to display  BP (!) 154/94 (BP Location: Left Arm)   Pulse (!) 104   Temp 98.2 F (36.8 C) (Oral)   Resp (!) 22   LMP 06/20/2012   SpO2 95%  No data found.   Physical Exam  Constitutional: She is oriented to person, place, and time. She appears well-developed and well-nourished. No distress.  HENT:  Head: Normocephalic and atraumatic.  Right Ear: External ear and ear canal normal. Tympanic membrane is not injected and not bulging.  Left Ear: External ear and ear canal normal. Tympanic membrane is injected. Tympanic membrane is not bulging.  Nose: Rhinorrhea present. Right sinus exhibits no maxillary sinus tenderness and no frontal sinus tenderness. Left sinus exhibits no maxillary sinus tenderness and no frontal sinus tenderness.  Mouth/Throat: Uvula is midline, oropharynx is clear and moist and mucous membranes are normal. No oropharyngeal exudate, posterior oropharyngeal edema or posterior oropharyngeal erythema.  Eyes: Conjunctivae are normal. Pupils are equal, round, and reactive to light.  Cardiovascular: Normal rate and regular rhythm.  Exam reveals no gallop and no friction rub.   No murmur heard. Pulmonary/Chest: Effort normal and breath sounds normal. No respiratory distress. She has no decreased breath sounds. She has no wheezes. She has no rhonchi. She has no rales.  Neurological: She is alert and oriented to person, place, and time.  Skin: Skin is warm and dry.  Psychiatric: She has a normal mood and affect. Her behavior is normal. Judgment normal.    Urgent Care Course     Procedures (including critical care time)  Labs Review Labs Reviewed - No data to display  Imaging Review No results  found.   Visual Acuity Review      MDM   1. Bronchitis    1. Discussed with patient history and exam most consistent with bronchitis from viral URI. Patient lung exam normal without adventitious lung sounds, O2 sat at 95-97% on room air. Given patient with 4 week history of cough, history of COPD and continues to smoke start Azithromycin 513m QD x 3 days and Prednisone dose pack as directed. Patient to continue Albuterol inhaler as needed. 2. [facility]. Encouraged smoking cessation.    YOk Edwards PA-C 01/12/17 1207

## 2018-02-15 ENCOUNTER — Inpatient Hospital Stay (HOSPITAL_COMMUNITY)
Admission: AD | Admit: 2018-02-15 | Discharge: 2018-02-15 | Disposition: A | Discharge status: Home / Self Care | Payer: Self-pay | Source: Ambulatory Visit | Attending: Obstetrics & Gynecology | Admitting: Obstetrics & Gynecology

## 2018-02-15 ENCOUNTER — Inpatient Hospital Stay (HOSPITAL_COMMUNITY): Payer: Self-pay

## 2018-02-15 ENCOUNTER — Encounter (HOSPITAL_COMMUNITY): Payer: Self-pay | Admitting: *Deleted

## 2018-02-15 DIAGNOSIS — R109 Unspecified abdominal pain: Secondary | ICD-10-CM

## 2018-02-15 DIAGNOSIS — I10 Essential (primary) hypertension: Secondary | ICD-10-CM

## 2018-02-15 DIAGNOSIS — R1084 Generalized abdominal pain: Secondary | ICD-10-CM

## 2018-02-15 DIAGNOSIS — F17210 Nicotine dependence, cigarettes, uncomplicated: Secondary | ICD-10-CM | POA: Insufficient documentation

## 2018-02-15 DIAGNOSIS — R197 Diarrhea, unspecified: Secondary | ICD-10-CM

## 2018-02-15 HISTORY — DX: Gastro-esophageal reflux disease without esophagitis: K21.9

## 2018-02-15 LAB — URINALYSIS, ROUTINE W REFLEX MICROSCOPIC
BACTERIA UA: NONE SEEN
GLUCOSE, UA: NEGATIVE mg/dL
KETONES UR: 20 mg/dL — AB
NITRITE: NEGATIVE
PH: 5 (ref 5.0–8.0)
PROTEIN: 100 mg/dL — AB
Specific Gravity, Urine: 1.025 (ref 1.005–1.030)

## 2018-02-15 LAB — COMPREHENSIVE METABOLIC PANEL
ALK PHOS: 70 U/L (ref 38–126)
ALT: 13 U/L (ref 0–44)
ANION GAP: 15 (ref 5–15)
AST: 24 U/L (ref 15–41)
Albumin: 3.3 g/dL — ABNORMAL LOW (ref 3.5–5.0)
BILIRUBIN TOTAL: 0.9 mg/dL (ref 0.3–1.2)
BUN: 19 mg/dL (ref 6–20)
CALCIUM: 8.9 mg/dL (ref 8.9–10.3)
CO2: 24 mmol/L (ref 22–32)
Chloride: 93 mmol/L — ABNORMAL LOW (ref 98–111)
Creatinine, Ser: 0.68 mg/dL (ref 0.44–1.00)
GFR calc Af Amer: >60 mL/min (ref >60)
GLUCOSE: 86 mg/dL (ref 70–99)
POTASSIUM: 4.8 mmol/L (ref 3.5–5.1)
Sodium: 132 mmol/L — ABNORMAL LOW (ref 135–145)
TOTAL PROTEIN: 7.4 g/dL (ref 6.5–8.1)

## 2018-02-15 LAB — CBC
HEMATOCRIT: 41 % (ref 36.0–46.0)
HEMOGLOBIN: 13.9 g/dL (ref 12.0–15.0)
MCH: 33.1 pg (ref 26.0–34.0)
MCHC: 33.9 g/dL (ref 30.0–36.0)
MCV: 97.6 fL (ref 78.0–100.0)
Platelets: 350 10*3/uL (ref 150–400)
RBC: 4.2 MIL/uL (ref 3.87–5.11)
RDW: 14.5 % (ref 11.5–15.5)
WBC: 12.2 10*3/uL — ABNORMAL HIGH (ref 4.0–10.5)

## 2018-02-15 LAB — WET PREP, GENITAL
Sperm: NONE SEEN
Trich, Wet Prep: NONE SEEN
YEAST WET PREP: NONE SEEN

## 2018-02-15 MED ORDER — METOCLOPRAMIDE HCL 10 MG PO TABS
10.0000 mg | ORAL_TABLET | Freq: Once | ORAL | Status: AC
  Administered 2018-02-15: 10 mg via ORAL
  Filled 2018-02-15: qty 1

## 2018-02-15 MED ORDER — METOCLOPRAMIDE HCL 10 MG PO TABS: 10.0000 mg | ORAL_TABLET | Freq: Four times a day (QID) | ORAL | 2 refills | Status: DC | PRN

## 2018-02-15 MED ORDER — KETOROLAC TROMETHAMINE 60 MG/2ML IM SOLN
60.0000 mg | Freq: Once | INTRAMUSCULAR | Status: AC
  Administered 2018-02-15: 60 mg via INTRAMUSCULAR
  Filled 2018-02-15: qty 2

## 2018-02-15 NOTE — MAU Provider Note (Addendum)
History     CSN: [id number]  Arrival date and time: 02/15/18 1235   First Provider Initiated Contact with Patient 02/15/18 1333      Chief Complaint  Patient presents with  . Abdominal Pain  . Emesis  . Diarrhea   HPI Back pain about a month ago.  Started taking ibuprofen.  Was diagnosed with MS (2004) COPD (2003) and ruptured disc previously.  For the past 2 weeks, she has been having abdominal pain and vomiting.  Having upper abdominal pain that is severe and feeling like something will burst.  Is very worried and at times tearful.  3-4 weeks ago had constipation and took MOM and then had some diarrhea. Last night she ate potato chips and then had no appetite.  Then at 2 am awakened with vomiting of undigested potato chips and severe abdominal pain. She reports she is worried about the weight loss.  OB History    Gravida  7   Para  2   Term  1   Preterm  1   AB  5   Living  2     SAB  3   TAB  2   Ectopic      Multiple      Live Births  1           Past Medical History:  Diagnosis Date  . Abnormal Pap smear    cryo  . Anemia   . Bone fracture    ankle  . COPD (chronic obstructive pulmonary disease) ([place])   . Emphysema   . GERD (gastroesophageal reflux disease)   . Headache(784.0)   . Hypertension   . Infection    urinary tract infection  . MS (multiple sclerosis) ([place])     Past Surgical History:  Procedure Laterality Date  . ARM WOUND REPAIR / CLOSURE    . BREAST BIOPSY Left 07/03/2012  . COLPOSCOPY    . DILATION AND CURETTAGE OF UTERUS    . TUBAL LIGATION      Family History  Problem Relation Age of Onset  . Diabetes Father   . Diabetes Sister   . Hypertension Sister   . Diabetes Brother   . Hypertension Brother   . Cancer Mother   . Other Neg Hx     Social History   Tobacco Use  . Smoking status: Current Every Day Smoker    Packs/day: 1.00    Types: Cigarettes  . Smokeless tobacco: Former Network engineer Use Topics  .  Alcohol use: Yes    Comment: Quit Sep 02, 2016  . Drug use: No    Allergies:  Allergies  Allergen Reactions  . Hydrocodone-Acetaminophen Nausea And Vomiting    Medications Prior to Admission  Medication Sig Dispense Refill Last Dose  . ibuprofen (ADVIL,MOTRIN) 200 MG tablet Take 800 mg by mouth every 6 (six) hours as needed for moderate pain or cramping.   Past Week at Unknown time  . albuterol (PROVENTIL HFA;VENTOLIN HFA) 108 (90 Base) MCG/ACT inhaler Inhale 1 puff into the lungs every 6 (six) hours as needed for wheezing or shortness of breath. (Patient not taking: Reported on 09/12/2016) 1 Inhaler 3 Not Taking  . Fluticasone-Salmeterol (ADVAIR) 250-50 MCG/DOSE AEPB Inhale 1 puff into the lungs 2 (two) times daily. (Patient not taking: Reported on 09/12/2016) 60 each 3 Not Taking  . phenazopyridine (PYRIDIUM) 200 MG tablet Take 1 tablet (200 mg total) by mouth 3 (three) times daily. (Patient not taking: Reported on  12/06/2016) 6 tablet 0 Not Taking  . phenazopyridine (PYRIDIUM) 200 MG tablet Take 1 tablet (200 mg total) by mouth 3 (three) times daily as needed for pain. (Patient not taking: Reported on 12/06/2016) 15 tablet 0 Not Taking  . predniSONE (STERAPRED UNI-PAK 21 TAB) 10 MG (21) TBPK tablet Take by mouth daily. Take 6 tabs by mouth day 1, then 5 tabs, then 4 tabs, then 3 tabs, 2 tabs, then 1 tab for the last day 21 tablet 0   . sertraline (ZOLOFT) 50 MG tablet Take 50 mg by mouth daily.   Taking    Review of Systems  Constitutional: Negative for fever.  Gastrointestinal: Positive for abdominal pain, diarrhea, nausea and vomiting.  Genitourinary: Negative for dysuria, vaginal bleeding and vaginal discharge.   Physical Exam   Blood pressure (!) 121/92, pulse (!) 126, temperature 98.2 F (36.8 C), height 5' 2"  (1.575 m), weight 117 lb (53.1 kg), last menstrual period 06/20/2012.  Physical Exam  Nursing note and vitals reviewed. Constitutional: She is oriented to person, place,  and time. She appears well-developed and well-nourished.  HENT:  Head: Normocephalic.  Eyes: EOM are normal.  Neck: Neck supple.  GI: Soft. She exhibits no distension and no mass. There is tenderness. There is guarding. There is no rebound.  Bowel sounds very hyperactive - continuously rolling. No rebound.  Very tender in the lower abdomen and in the upper abdomen - epigastric.  Genitourinary:  Genitourinary Comments: Speculum exam: Vagina - Small amount of white discharge, no odor Cervix - No contact bleeding Bimanual exam: Cervix closed Uterus non tender, 12 week size, Adnexa non tender, no masses bilaterally GC/Chlam, wet prep done Chaperone present for exam.   Musculoskeletal: Normal range of motion.  Neurological: She is alert and oriented to person, place, and time.  Skin: Skin is warm and dry.  Psychiatric: She has a normal mood and affect.    MAU Course  Procedures  Results for orders placed or performed during the hospital encounter of 02/15/18 (from the past 24 hour(s))  Urinalysis, Routine w reflex microscopic     Status: Abnormal   Collection Time: 02/15/18  1:18 PM  Result Value Ref Range   Color, Urine AMBER (A) YELLOW   APPearance CLOUDY (A) CLEAR   Specific Gravity, Urine 1.025 1.005 - 1.030   pH 5.0 5.0 - 8.0   Glucose, UA NEGATIVE NEGATIVE mg/dL   Hgb urine dipstick SMALL (A) NEGATIVE   Bilirubin Urine SMALL (A) NEGATIVE   Ketones, ur 20 (A) NEGATIVE mg/dL   Protein, ur 100 (A) NEGATIVE mg/dL   Nitrite NEGATIVE NEGATIVE   Leukocytes, UA TRACE (A) NEGATIVE   RBC / HPF 11-20 0 - 5 RBC/hpf   WBC, UA 21-50 0 - 5 WBC/hpf   Bacteria, UA NONE SEEN NONE SEEN   Squamous Epithelial / LPF 11-20 0 - 5   Mucus PRESENT    Hyaline Casts, UA PRESENT    Granular Casts, UA PRESENT   CBC     Status: Abnormal   Collection Time: 02/15/18  2:02 PM  Result Value Ref Range   WBC 12.2 (H) 4.0 - 10.5 K/uL   RBC 4.20 3.87 - 5.11 MIL/uL   Hemoglobin 13.9 12.0 - 15.0 g/dL    HCT 41.0 36.0 - 46.0 %   MCV 97.6 78.0 - 100.0 fL   MCH 33.1 26.0 - 34.0 pg   MCHC 33.9 30.0 - 36.0 g/dL   RDW 14.5 11.5 - 15.5 %  Platelets 350 150 - 400 K/uL  Comprehensive metabolic panel     Status: Abnormal   Collection Time: 02/15/18  2:02 PM  Result Value Ref Range   Sodium 132 (L) 135 - 145 mmol/L   Potassium 4.8 3.5 - 5.1 mmol/L   Chloride 93 (L) 98 - 111 mmol/L   CO2 24 22 - 32 mmol/L   Glucose, Bld 86 70 - 99 mg/dL   BUN 19 6 - 20 mg/dL   Creatinine, Ser 0.68 0.44 - 1.00 mg/dL   Calcium 8.9 8.9 - 10.3 mg/dL   Total Protein 7.4 6.5 - 8.1 g/dL   Albumin 3.3 (L) 3.5 - 5.0 g/dL   AST 24 15 - 41 U/L   ALT 13 0 - 44 U/L   Alkaline Phosphatase 70 38 - 126 U/L   Total Bilirubin 0.9 0.3 - 1.2 mg/dL   GFR calc non Af Amer >60 >60 mL/min   GFR calc Af Amer >60 >60 mL/min   Anion gap 15 5 - 15  Wet prep, genital     Status: Abnormal   Collection Time: 02/15/18  2:35 PM  Result Value Ref Range   Yeast Wet Prep HPF POC NONE SEEN NONE SEEN   Trich, Wet Prep NONE SEEN NONE SEEN   Clue Cells Wet Prep HPF POC PRESENT (A) NONE SEEN   WBC, Wet Prep HPF POC FEW (A) NONE SEEN   Sperm NONE SEEN      CLINICAL DATA:  Abdominal pain.  EXAM: TRANSABDOMINAL AND TRANSVAGINAL ULTRASOUND OF PELVIS  TECHNIQUE: Both transabdominal and transvaginal ultrasound examinations of the pelvis were performed. Transabdominal technique was performed for global imaging of the pelvis including uterus, ovaries, adnexal regions, and pelvic cul-de-sac. It was necessary to proceed with endovaginal exam following the transabdominal exam to visualize the ovaries and endometrial stripe and better visualize the uterus.  COMPARISON:  None  FINDINGS: Uterus  Measurements: 5.4 x 4.0 x 3.0 cm. No fibroids or other mass visualized.  Endometrium  Thickness: 3.2 mm. Small focal calcification. Small amount of fluid in the cervical canal.  Right ovary  Measurements: 1.7 x 1.6 x 1.2 cm. Normal  appearance/no adnexal mass.  Left ovary  Measurements: 1.9 x 1.3 x 1.3 cm. Normal appearance/no adnexal mass.  Other findings  Small to moderate amount of free peritoneal fluid.  IMPRESSION: Small to moderate amount of free peritoneal fluid. Otherwise, unremarkable examination.  MDM Client's pain was much improved with Toradol IM today.  Did not have any vomiting or diarrhea while in MAU - was able to walk across the street to smoke a cigarette. Client has lost from 126 to 117 from February to August 2019.  Was unaware she had diverticulitis noted on CT scan in 2014.  Today labs are relatively normal - no admission, no surgery, and no antibiotics needed for now.  But client definitely has unresolved GI issues.  Has not yet had a colonoscopy and with all the GI problems she has been having that would be a next step although she has no insurance.  Will need to follow up with [facility].  Advised this will not be done in the ER setting.  Client has been very anxious and reassured today.  Advised not to take Motrin without some food.  Assessment and Plan  diarhea Abdominal pain - generalized Hypertension - currently not being treated - client does not take medications at this time  Plan reglan prescribed to help with abdominal pain Will  have a liquid or soft food diet for a couple of days and advance as tolerated. To see [facility] as she does need a colonoscopy Advised for further emergency care will need to be seen at [facility] or [place] to go to the ER if she has fever, chills or body aches.  Terri L Burleson 02/15/2018, 1:39 PM

## 2018-02-15 NOTE — Progress Notes (Signed)
Butch Penny, NP notified of BP.

## 2018-02-15 NOTE — Discharge Instructions (Signed)
Be sure to take any ibuprofen or motrin with a couple of crackers. Add tylenol which can help the motrin work better. Begin with a liquid diet and increase to soft foods slowly. Do not eat fried foods or high fat foods. Get your medication from the pharmacy and use as directed. You need to have a colonoscopy done and [facility] can help you get that started.

## 2018-02-15 NOTE — MAU Note (Signed)
Pt reports she has had N/V D off and on for several months. reports abd cramping got real bad last night thought something was going to r"rupture". Also c/o decreased appetite and weakness.

## 2018-02-16 LAB — GC/CHLAMYDIA PROBE AMP (~~LOC~~) NOT AT ARMC
Chlamydia: NEGATIVE
Neisseria Gonorrhea: NEGATIVE

## 2018-02-23 ENCOUNTER — Encounter (HOSPITAL_COMMUNITY): Payer: Self-pay | Admitting: Emergency Medicine

## 2018-02-23 ENCOUNTER — Emergency Department (HOSPITAL_COMMUNITY): Payer: Self-pay

## 2018-02-23 ENCOUNTER — Other Ambulatory Visit: Payer: Self-pay

## 2018-02-23 ENCOUNTER — Emergency Department (HOSPITAL_COMMUNITY)
Admission: EM | Admit: 2018-02-23 | Discharge: 2018-02-24 | Disposition: A | Discharge status: Home / Self Care | Payer: Self-pay | Attending: Emergency Medicine | Admitting: Emergency Medicine

## 2018-02-23 DIAGNOSIS — I10 Essential (primary) hypertension: Secondary | ICD-10-CM | POA: Insufficient documentation

## 2018-02-23 DIAGNOSIS — R69 Illness, unspecified: Secondary | ICD-10-CM

## 2018-02-23 DIAGNOSIS — J449 Chronic obstructive pulmonary disease, unspecified: Secondary | ICD-10-CM | POA: Insufficient documentation

## 2018-02-23 DIAGNOSIS — K632 Fistula of intestine: Secondary | ICD-10-CM | POA: Insufficient documentation

## 2018-02-23 DIAGNOSIS — G35 Multiple sclerosis: Secondary | ICD-10-CM | POA: Insufficient documentation

## 2018-02-23 DIAGNOSIS — F17210 Nicotine dependence, cigarettes, uncomplicated: Secondary | ICD-10-CM | POA: Insufficient documentation

## 2018-02-23 LAB — CBC
HCT: 38.9 % (ref 36.0–46.0)
Hemoglobin: 12.6 g/dL (ref 12.0–15.0)
MCH: 31.7 pg (ref 26.0–34.0)
MCHC: 32.4 g/dL (ref 30.0–36.0)
MCV: 97.7 fL (ref 78.0–100.0)
PLATELETS: 396 10*3/uL (ref 150–400)
RBC: 3.98 MIL/uL (ref 3.87–5.11)
RDW: 13.8 % (ref 11.5–15.5)
WBC: 8.6 10*3/uL (ref 4.0–10.5)

## 2018-02-23 LAB — URINALYSIS, ROUTINE W REFLEX MICROSCOPIC
Glucose, UA: NEGATIVE mg/dL
KETONES UR: 80 mg/dL — AB
Nitrite: NEGATIVE
PH: 5 (ref 5.0–8.0)
Protein, ur: 30 mg/dL — AB
RBC / HPF: >50 RBC/hpf — ABNORMAL HIGH (ref 0–5)
SPECIFIC GRAVITY, URINE: 1.024 (ref 1.005–1.030)

## 2018-02-23 LAB — LIPASE, BLOOD: LIPASE: 25 U/L (ref 11–51)

## 2018-02-23 LAB — COMPREHENSIVE METABOLIC PANEL
ALT: 10 U/L (ref 0–44)
AST: 16 U/L (ref 15–41)
Albumin: 2.9 g/dL — ABNORMAL LOW (ref 3.5–5.0)
Alkaline Phosphatase: 55 U/L (ref 38–126)
Anion gap: 13 (ref 5–15)
BUN: 9 mg/dL (ref 6–20)
CHLORIDE: 94 mmol/L — AB (ref 98–111)
CO2: 29 mmol/L (ref 22–32)
CREATININE: 0.59 mg/dL (ref 0.44–1.00)
Calcium: 8.7 mg/dL — ABNORMAL LOW (ref 8.9–10.3)
GFR calc Af Amer: >60 mL/min (ref >60)
GFR calc non Af Amer: >60 mL/min (ref >60)
Glucose, Bld: 96 mg/dL (ref 70–99)
Potassium: 3.1 mmol/L — ABNORMAL LOW (ref 3.5–5.1)
SODIUM: 136 mmol/L (ref 135–145)
Total Bilirubin: 0.7 mg/dL (ref 0.3–1.2)
Total Protein: 7.2 g/dL (ref 6.5–8.1)

## 2018-02-23 LAB — I-STAT BETA HCG BLOOD, ED (MC, WL, AP ONLY): I-stat hCG, quantitative: 8 m[IU]/mL — ABNORMAL HIGH (ref <5)

## 2018-02-23 LAB — PREGNANCY, URINE: PREG TEST UR: NEGATIVE

## 2018-02-23 MED ORDER — CIPROFLOXACIN HCL 500 MG PO TABS
500.0000 mg | ORAL_TABLET | Freq: Once | ORAL | Status: AC
  Administered 2018-02-23: 500 mg via ORAL
  Filled 2018-02-23: qty 1

## 2018-02-23 MED ORDER — MORPHINE SULFATE (PF) 4 MG/ML IV SOLN
4.0000 mg | Freq: Once | INTRAVENOUS | Status: AC
  Administered 2018-02-23: 4 mg via INTRAVENOUS
  Filled 2018-02-23: qty 1

## 2018-02-23 MED ORDER — SODIUM CHLORIDE 0.9 % IV BOLUS
1000.0000 mL | Freq: Once | INTRAVENOUS | Status: AC
  Administered 2018-02-23: 1000 mL via INTRAVENOUS

## 2018-02-23 MED ORDER — METRONIDAZOLE 500 MG PO TABS
500.0000 mg | ORAL_TABLET | Freq: Once | ORAL | Status: AC
  Administered 2018-02-23: 500 mg via ORAL
  Filled 2018-02-23: qty 1

## 2018-02-23 MED ORDER — IOHEXOL 300 MG/ML  SOLN
100.0000 mL | Freq: Once | INTRAMUSCULAR | Status: AC | PRN
Start: 2018-02-23 — End: 2018-02-23
  Administered 2018-02-23: 100 mL via INTRAVENOUS

## 2018-02-23 MED ORDER — POTASSIUM CHLORIDE CRYS ER 20 MEQ PO TBCR
40.0000 meq | EXTENDED_RELEASE_TABLET | Freq: Once | ORAL | Status: AC
  Administered 2018-02-23: 40 meq via ORAL
  Filled 2018-02-23: qty 2

## 2018-02-23 MED ORDER — OXYCODONE-ACETAMINOPHEN 5-325 MG PO TABS
1.0000 | ORAL_TABLET | Freq: Once | ORAL | Status: AC
Start: 2018-02-23 — End: 2018-02-23
  Administered 2018-02-23: 1 via ORAL
  Filled 2018-02-23: qty 1

## 2018-02-23 MED ORDER — PREDNISONE 20 MG PO TABS
60.0000 mg | ORAL_TABLET | Freq: Once | ORAL | Status: AC
  Administered 2018-02-23: 60 mg via ORAL
  Filled 2018-02-23: qty 3

## 2018-02-23 NOTE — ED Triage Notes (Signed)
Pt c/o generalized abd pain with diarrhea and vomiting x's 1 1/2 weeks.  Pt st's she called [facility] but could not get appt. Until Sept.  Pt c/o severe cramping in abd.

## 2018-02-23 NOTE — ED Provider Notes (Signed)
[facility] EMERGENCY DEPARTMENT Provider Note   CSN: [id number] Arrival date & time: 02/23/18  1529     History   Chief Complaint Chief Complaint  Patient presents with  . Abdominal Pain  . Diarrhea    HPI Christina Miller is a 52 y.o. female.  HPI  52 year old female presents with vomiting, diarrhea, abdominal pain.  The abdominal pain has been for about 9 days.  Diarrhea has been on and off for a month but worse during the time of abdominal pain.  No fevers but has been having chills.  Had vomiting originally but none now.  Has been having decreased p.o. intake.  Diarrhea is about 3 or 4 times a day and is nonbloody.  Pain is significant.  Went to [facility] where she had an ultrasound was told she would need a colonoscopy and told to follow-up with [facility] but they cannot see her for another month.  Pain seems to be worsening. No urinary symptoms. Pain comes and goes, is cramping but also some epigastric burning, especially if she eats.  Past Medical History:  Diagnosis Date  . Abnormal Pap smear    cryo  . Anemia   . Bone fracture    ankle  . COPD (chronic obstructive pulmonary disease) ([place])   . Emphysema   . GERD (gastroesophageal reflux disease)   . Headache(784.0)   . Hypertension   . Infection    urinary tract infection  . MS (multiple sclerosis) [facility])     Patient Active Problem List   Diagnosis Date Noted  . COPD exacerbation ([place]) 04/21/2016  . Protrusion of lumbar intervertebral disc 04/21/2016  . Smoking addiction 04/21/2016  . Breast lump on left side at 7 o'clock position 06/22/2012  . Hypertension, benign 06/02/2011  . Tobacco abuse 06/02/2011  . Perimenopause 06/02/2011  . Hematuria, microscopic 06/02/2011    Past Surgical History:  Procedure Laterality Date  . ARM WOUND REPAIR / CLOSURE    . BREAST BIOPSY Left 07/03/2012  . COLPOSCOPY    . DILATION AND CURETTAGE OF UTERUS    . TUBAL LIGATION       OB History     Gravida  7   Para  2   Term  1   Preterm  1   AB  5   Living  2     SAB  3   TAB  2   Ectopic      Multiple      Live Births  1            Home Medications    Prior to Admission medications   Medication Sig Start Date End Date Taking? Authorizing Provider  ciprofloxacin (CIPRO) 500 MG tablet Take 1 tablet (500 mg total) by mouth 2 (two) times daily. One po bid x 7 days 02/24/18   Sherwood Gambler, MD  metoCLOPramide (REGLAN) 10 MG tablet Take 1 tablet (10 mg total) by mouth every 6 (six) hours as needed for nausea. Patient not taking: Reported on 02/23/2018 02/15/18   Virginia Rochester, NP  metroNIDAZOLE (FLAGYL) 500 MG tablet Take 1 tablet (500 mg total) by mouth 2 (two) times daily. One po bid x 7 days 02/24/18   Sherwood Gambler, MD  oxyCODONE-acetaminophen (PERCOCET) 5-325 MG tablet Take 1 tablet by mouth every 4 (four) hours as needed for severe pain. 02/24/18   Sherwood Gambler, MD  predniSONE (DELTASONE) 10 MG tablet 40 mg daily x 6 days, 30 mg  daily x 7 days, 20 mg daily x 7 days then 10 mg daily x 7 days 02/24/18   Sherwood Gambler, MD    Family History Family History  Problem Relation Age of Onset  . Diabetes Father   . Diabetes Sister   . Hypertension Sister   . Diabetes Brother   . Hypertension Brother   . Cancer Mother   . Other Neg Hx     Social History Social History   Tobacco Use  . Smoking status: Current Every Day Smoker    Packs/day: 1.00    Types: Cigarettes  . Smokeless tobacco: Former Network engineer Use Topics  . Alcohol use: Yes    Comment: Quit Sep 02, 2016  . Drug use: No     Allergies   Hydrocodone-acetaminophen   Review of Systems Review of Systems  Constitutional: Positive for chills. Negative for fever.  Gastrointestinal: Positive for abdominal pain, diarrhea and vomiting. Negative for blood in stool.  Genitourinary: Negative for dysuria.  Musculoskeletal: Positive for back pain.  All other systems reviewed and are  negative.    Physical Exam Updated Vital Signs BP 129/85   Pulse 82   Temp 98.4 F (36.9 C) (Oral)   Resp 16   Ht 5' 2"  (1.575 m)   Wt 53.1 kg   LMP 06/20/2012   SpO2 95%   BMI 21.40 kg/m   Physical Exam  Constitutional: She is oriented to person, place, and time. She appears well-developed and well-nourished.  HENT:  Head: Normocephalic and atraumatic.  Right Ear: External ear normal.  Left Ear: External ear normal.  Nose: Nose normal.  Eyes: Right eye exhibits no discharge. Left eye exhibits no discharge.  Cardiovascular: Normal rate, regular rhythm and normal heart sounds.  Pulmonary/Chest: Effort normal and breath sounds normal.  Abdominal: Soft. There is tenderness in the right lower quadrant. There is no CVA tenderness.  Neurological: She is alert and oriented to person, place, and time.  Skin: Skin is warm and dry.  Nursing note and vitals reviewed.    ED Treatments / Results  Labs (all labs ordered are listed, but only abnormal results are displayed) Labs Reviewed  COMPREHENSIVE METABOLIC PANEL - Abnormal; Notable for the following components:      Result Value   Potassium 3.1 (*)    Chloride 94 (*)    Calcium 8.7 (*)    Albumin 2.9 (*)    All other components within normal limits  URINALYSIS, ROUTINE W REFLEX MICROSCOPIC - Abnormal; Notable for the following components:   Color, Urine AMBER (*)    APPearance CLOUDY (*)    Hgb urine dipstick SMALL (*)    Bilirubin Urine SMALL (*)    Ketones, ur 80 (*)    Protein, ur 30 (*)    Leukocytes, UA TRACE (*)    RBC / HPF >50 (*)    Bacteria, UA FEW (*)    All other components within normal limits  I-STAT BETA HCG BLOOD, ED ([facility], [facility], [facility] ONLY) - Abnormal; Notable for the following components:   I-stat hCG, quantitative 8.0 (*)    All other components within normal limits  LIPASE, BLOOD  CBC  PREGNANCY, URINE    EKG None  Radiology Ct Abdomen Pelvis W Contrast  Result Date: 02/23/2018 CLINICAL DATA:   52 year old female with history of epigastric pain. EXAM: CT ABDOMEN AND PELVIS WITH CONTRAST TECHNIQUE: Multidetector CT imaging of the abdomen and pelvis was performed using the standard protocol following bolus  administration of intravenous contrast. CONTRAST:  124m OMNIPAQUE IOHEXOL 300 MG/ML  SOLN COMPARISON:  CT the abdomen and pelvis 11/04/2012. FINDINGS: Lower chest: 3 mm pulmonary nodule in the right middle lobe (axial image 1 of series 5), unchanged compared to 2014, considered definitively benign requiring no future imaging follow-up. Hepatobiliary: Low-attenuation/hypoperfusion adjacent to the falciform ligament most evident in segment 4B, likely to represent a benign perfusion anomaly. No other definite suspicious appearing cystic or solid hepatic lesions. No intra or extrahepatic biliary ductal dilatation. Gallbladder is normal in appearance. Pancreas: No pancreatic mass. No pancreatic ductal dilatation. No pancreatic or peripancreatic fluid or inflammatory changes. Spleen: Unremarkable. Adrenals/Urinary Tract: Mild left hydroureteronephrosis which appears related to extrinsic compression of the distal third of the left ureter (discussed below). Bilateral kidneys and adrenal glands are otherwise normal in appearance. Urinary bladder is normal in appearance. Stomach/Bowel: Normal appearance of the stomach. No pathologic dilatation of small bowel or colon. However, there are areas throughout the mid to distal small bowel with profound mural thickening and extensive surrounding inflammatory changes. Terminal ileum does appear involved with mural thickening and luminal narrowing. The most severe area of mural thickening appears to be in the proximal ileum or distal jejunum, best appreciated in the central aspect of the pelvis where there are extensive inflammatory changes in the small bowel mesentery, as well as extraluminal tracks which appear to reflect enteroenteric and/or enterocolonic fistulae, which  appear to contain fecalized contents. This is best appreciated on coronal images 39-75 of series 6. Normal appendix. Vascular/Lymphatic: Aortic atherosclerosis, without evidence of aneurysm or dissection in the abdominal or pelvic vasculature. No lymphadenopathy noted in the abdomen or pelvis. Reproductive: Uterus and ovaries are unremarkable in appearance. Other: Trace volume of ascites.  No pneumoperitoneum. Musculoskeletal: There are no aggressive appearing lytic or blastic lesions noted in the visualized portions of the skeleton. IMPRESSION: 1. There is a spectrum of findings suggestive of active Crohn's disease, as detailed above. In addition to extensive areas of mural thickening, mucosal hyperenhancement and surrounding inflammatory changes, there appears to be enteroenteric and/or enterocolonic fistulae, as discussed above. 2. Aortic atherosclerosis. 3. Additional incidental findings, as above. Electronically Signed   By: DVinnie LangtonM.D.   On: 02/23/2018 22:48    Procedures Procedures (including critical care time)  Medications Ordered in ED Medications  sodium chloride 0.9 % bolus 1,000 mL (0 mLs Intravenous Stopped 02/23/18 2220)  potassium chloride SA (K-DUR,KLOR-CON) CR tablet 40 mEq (40 mEq Oral Given 02/23/18 2119)  morphine 4 MG/ML injection 4 mg (4 mg Intravenous Given 02/23/18 2120)  iohexol (OMNIPAQUE) 300 MG/ML solution 100 mL (100 mLs Intravenous Contrast Given 02/23/18 2213)  oxyCODONE-acetaminophen (PERCOCET/ROXICET) 5-325 MG per tablet 1 tablet (1 tablet Oral Given 02/23/18 2354)  ciprofloxacin (CIPRO) tablet 500 mg (500 mg Oral Given 02/23/18 2354)  metroNIDAZOLE (FLAGYL) tablet 500 mg (500 mg Oral Given 02/23/18 2355)  predniSONE (DELTASONE) tablet 60 mg (60 mg Oral Given 02/23/18 2355)     Initial Impression / Assessment and Plan / ED Course  I have reviewed the triage vital signs and the nursing notes.  Pertinent labs & imaging results that were available during my care of the  patient were reviewed by me and considered in my medical decision making (see chart for details).     CT scan does not show appendicitis but does show concern for Crohn's disease.  She has enteroenteric and enterocolonic fistulae.  However there is no abscess or other acute emergent condition.  I discussed  with GI, Dr. Lyndel Safe, who recommends starting on Cipro and Flagyl, prednisone taper starting at 40 mg daily per week down to 10 mg daily per week over 4 weeks as well as pain control.  However she should be discharged home to follow-up with GI in 1-2 weeks.  She feels better.  Discharged home with return precautions.  Final Clinical Impressions(s) / ED Diagnoses   Final diagnoses:  Chronic disease  Enteroenteric fistula    ED Discharge Orders         Ordered    predniSONE (DELTASONE) 10 MG tablet     02/24/18 0007    oxyCODONE-acetaminophen (PERCOCET) 5-325 MG tablet  Every 4 hours PRN     02/24/18 0007    ciprofloxacin (CIPRO) 500 MG tablet  2 times daily     02/24/18 0007    metroNIDAZOLE (FLAGYL) 500 MG tablet  2 times daily     02/24/18 0007           Sherwood Gambler, MD 02/24/18 0010

## 2018-02-24 DIAGNOSIS — K5720 Diverticulitis of large intestine with perforation and abscess without bleeding: Secondary | ICD-10-CM | POA: Insufficient documentation

## 2018-02-24 MED ORDER — PREDNISONE 10 MG PO TABS: ORAL_TABLET | ORAL | 0 refills | Status: DC

## 2018-02-24 MED ORDER — METRONIDAZOLE 500 MG PO TABS: 500.0000 mg | ORAL_TABLET | Freq: Two times a day (BID) | ORAL | 0 refills | Status: DC

## 2018-02-24 MED ORDER — CIPROFLOXACIN HCL 500 MG PO TABS: 500.0000 mg | ORAL_TABLET | Freq: Two times a day (BID) | ORAL | 0 refills | Status: DC

## 2018-02-24 MED ORDER — OXYCODONE-ACETAMINOPHEN 5-325 MG PO TABS: 1.0000 | ORAL_TABLET | ORAL | 0 refills | Status: DC | PRN

## 2018-02-24 NOTE — Discharge Instructions (Signed)
[facility] will call you on Monday for an appointment in 1-2 weeks.  If you develop worsening abdominal pain, or if you develop vomiting, fever, or any other new/concerning symptoms and return to the ER for evaluation.

## 2018-03-05 ENCOUNTER — Other Ambulatory Visit (INDEPENDENT_AMBULATORY_CARE_PROVIDER_SITE_OTHER): Payer: Self-pay

## 2018-03-05 ENCOUNTER — Ambulatory Visit (INDEPENDENT_AMBULATORY_CARE_PROVIDER_SITE_OTHER): Payer: Self-pay | Admitting: Gastroenterology

## 2018-03-05 ENCOUNTER — Encounter: Payer: Self-pay | Admitting: Gastroenterology

## 2018-03-05 VITALS — BP 112/66 | HR 88 | Ht 62.0 in | Wt 113.4 lb

## 2018-03-05 DIAGNOSIS — R1084 Generalized abdominal pain: Secondary | ICD-10-CM

## 2018-03-05 DIAGNOSIS — R197 Diarrhea, unspecified: Secondary | ICD-10-CM

## 2018-03-05 DIAGNOSIS — R9389 Abnormal findings on diagnostic imaging of other specified body structures: Secondary | ICD-10-CM

## 2018-03-05 DIAGNOSIS — R112 Nausea with vomiting, unspecified: Secondary | ICD-10-CM

## 2018-03-05 LAB — CBC WITH DIFFERENTIAL/PLATELET
BASOS PCT: 0.2 % (ref 0.0–3.0)
Basophils Absolute: 0 10*3/uL (ref 0.0–0.1)
EOS ABS: 0 10*3/uL (ref 0.0–0.7)
Eosinophils Relative: 0.1 % (ref 0.0–5.0)
HEMATOCRIT: 40.6 % (ref 36.0–46.0)
HEMOGLOBIN: 13.2 g/dL (ref 12.0–15.0)
LYMPHS PCT: 5.2 % — AB (ref 12.0–46.0)
Lymphs Abs: 1 10*3/uL (ref 0.7–4.0)
MCHC: 32.5 g/dL (ref 30.0–36.0)
MCV: 96.4 fl (ref 78.0–100.0)
Monocytes Absolute: 0.8 10*3/uL (ref 0.1–1.0)
Monocytes Relative: 3.9 % (ref 3.0–12.0)
Neutro Abs: 17.3 10*3/uL — ABNORMAL HIGH (ref 1.4–7.7)
Neutrophils Relative %: 90.6 % — ABNORMAL HIGH (ref 43.0–77.0)
Platelets: 414 10*3/uL — ABNORMAL HIGH (ref 150.0–400.0)
RBC: 4.21 Mil/uL (ref 3.87–5.11)
RDW: 15.9 % — AB (ref 11.5–15.5)
WBC: 19.1 10*3/uL — AB (ref 4.0–10.5)

## 2018-03-05 LAB — COMPREHENSIVE METABOLIC PANEL
ALBUMIN: 3.6 g/dL (ref 3.5–5.2)
ALT: 20 U/L (ref 0–35)
AST: 23 U/L (ref 0–37)
Alkaline Phosphatase: 62 U/L (ref 39–117)
BUN: 16 mg/dL (ref 6–23)
CHLORIDE: 100 meq/L (ref 96–112)
CO2: 29 mEq/L (ref 19–32)
Calcium: 9.3 mg/dL (ref 8.4–10.5)
Creatinine, Ser: 0.7 mg/dL (ref 0.40–1.20)
GFR: 92.83 mL/min (ref >60.00)
Glucose, Bld: 156 mg/dL — ABNORMAL HIGH (ref 70–99)
POTASSIUM: 3.6 meq/L (ref 3.5–5.1)
Sodium: 137 mEq/L (ref 135–145)
Total Bilirubin: 0.6 mg/dL (ref 0.2–1.2)
Total Protein: 7.1 g/dL (ref 6.0–8.3)

## 2018-03-05 LAB — VITAMIN B12: VITAMIN B 12: 377 pg/mL (ref 211–911)

## 2018-03-05 LAB — C-REACTIVE PROTEIN: CRP: 1.1 mg/dL (ref 0.5–20.0)

## 2018-03-05 LAB — FOLATE: FOLATE: 8.8 ng/mL (ref >5.9)

## 2018-03-05 LAB — FERRITIN: FERRITIN: 299.9 ng/mL — AB (ref 10.0–291.0)

## 2018-03-05 LAB — SEDIMENTATION RATE: Sed Rate: 35 mm/hr — ABNORMAL HIGH (ref 0–30)

## 2018-03-05 MED ORDER — METRONIDAZOLE 500 MG PO TABS: 500.0000 mg | ORAL_TABLET | Freq: Two times a day (BID) | ORAL | 0 refills | Status: DC

## 2018-03-05 MED ORDER — CIPROFLOXACIN HCL 500 MG PO TABS: 500.0000 mg | ORAL_TABLET | Freq: Two times a day (BID) | ORAL | 0 refills | Status: DC

## 2018-03-05 NOTE — Patient Instructions (Addendum)
You have been scheduled for an endoscopy and colonoscopy. Please follow the written instructions given to you at your visit today. Please pick up your prep supplies at the pharmacy within the next 1-3 days. If you use inhalers (even only as needed), please bring them with you on the day of your procedure. Your physician has requested that you go to www.startemmi.com and enter the access code given to you at your visit today. This web site gives a general overview about your procedure. However, you should still follow specific instructions given to you by our office regarding your preparation for the procedure.  Go to the basement for labs today  Continue Prednisone taper decrease by 5 mg every week   We will send Cipro/flagyl  to your pharmacy     You have been scheduled for a CT scan of the abdomen and pelvis at [facility] ([address]---this is in the same building as Press [facility]).   You are scheduled on 8/23 at 10:15am. You should arrive 15 minutes prior to your appointment time for registration. Please follow the written instructions below on the day of your exam:  WARNING: IF YOU ARE ALLERGIC TO IODINE/X-RAY DYE, PLEASE NOTIFY RADIOLOGY IMMEDIATELY AT [phone]! YOU WILL BE GIVEN A 13 HOUR PREMEDICATION PREP.  1) Do not eat or drink anything after 6:15am (4 hours prior to your test) 2) You have been given 2 bottles of oral contrast to drink. The solution may taste better if refrigerated, but do NOT add ice or any other liquid to this solution. Shake well before drinking.    Drink 1 bottle of contrast @ 8:15am (2 hours prior to your exam)  Drink 1 bottle of contrast @ 9:15am (1 hour prior to your exam)  You may take any medications as prescribed with a small amount of water except for the following: Metformin, Glucophage, Glucovance, Avandamet, Riomet, Fortamet, Actoplus Met, Janumet, Glumetza or Metaglip. The above medications must be held the day of the exam  AND 48 hours after the exam.  The purpose of you drinking the oral contrast is to aid in the visualization of your intestinal tract. The contrast solution may cause some diarrhea. Before your exam is started, you will be given a small amount of fluid to drink. Depending on your individual set of symptoms, you may also receive an intravenous injection of x-ray contrast/dye. Plan on being at [facility] for 30 minutes or longer, depending on the type of exam you are having performed.  This test typically takes 30-45 minutes to complete.  If you have any questions regarding your exam or if you need to reschedule, you may call the CT department at [phone] between the hours of 8:00 am and 5:00 pm, Monday-Friday.  ________________________________________________________________________

## 2018-03-05 NOTE — Progress Notes (Addendum)
Christina Miller    [id number]    January 22, 1966  Primary Care Physician:Patient, No Pcp Per  Referring Physician: No referring provider defined for this encounter.  Chief complaint: Abdominal pain, vomiting, diarrhea, weight loss, fatigue  HPI:  52 year old female with 2 recent ER visits within the past 2 weeks (August 1 and February 23, 2018) for severe abdominal pain, diarrhea and vomiting.  Patient has had the symptoms for the past 4-5 weeks progressively worse She was constipated severely for 2-3 weeks last month with bloating and at times felt unable to pass any gas, subsequently developed severe diarrhea and vomiting associated with severe abdominal cramps, had pelvic ultrasound at [facility] on February 15, 2018 that showed small to moderate amount of free peritoneal fluid otherwise was unremarkable per report.  Subsequently came to ER at [facility] on February 23, 2018 CT abdomen pelvis with contrast on February 23, 2018 showed significant small bowel inflammation with mural thickening and inflammation of adjacent mesentery.  Also has evidence of enteroenteric and entero colic fistulous.  She was discharged home on Cipro, Flagyl X 7 days and prednisone 40 mg daily with 10 mg taper every week Since starting medication, she is feeling that her diarrhea is improving but continues to have significant abdominal pain and cramping and currently unable to tolerate anything more than mashed potato consistency.  If she eats any meat, beans or vegetables has severe abdominal pain cramping.  Continues to have intermittent nausea but no longer vomiting.  She is passing some gas and is having 1-2 bowel movements daily.  Denies any blood or dark-colored stool.  She has lost 5 to 10 pounds in the past month.  Outpatient Encounter Medications as of 03/05/2018  Medication Sig  . ciprofloxacin (CIPRO) 500 MG tablet Take 1 tablet (500 mg total) by mouth 2 (two) times daily. One po bid x 7 days    . metoCLOPramide (REGLAN) 10 MG tablet Take 1 tablet (10 mg total) by mouth every 6 (six) hours as needed for nausea. (Patient not taking: Reported on 02/23/2018)  . metroNIDAZOLE (FLAGYL) 500 MG tablet Take 1 tablet (500 mg total) by mouth 2 (two) times daily. One po bid x 7 days  . oxyCODONE-acetaminophen (PERCOCET) 5-325 MG tablet Take 1 tablet by mouth every 4 (four) hours as needed for severe pain.  . predniSONE (DELTASONE) 10 MG tablet 40 mg daily x 6 days, 30 mg daily x 7 days, 20 mg daily x 7 days then 10 mg daily x 7 days   No facility-administered encounter medications on file as of 03/05/2018.     Allergies as of 03/05/2018 - Review Complete 02/23/2018  Allergen Reaction Noted  . Hydrocodone-acetaminophen Nausea And Vomiting 01/15/2014    Past Medical History:  Diagnosis Date  . Abnormal Pap smear    cryo  . Anemia   . Bone fracture    ankle  . COPD (chronic obstructive pulmonary disease) ([place])   . Emphysema   . GERD (gastroesophageal reflux disease)   . Headache(784.0)   . Hypertension   . Infection    urinary tract infection  . MS (multiple sclerosis) ([place])     Past Surgical History:  Procedure Laterality Date  . ARM WOUND REPAIR / CLOSURE    . BREAST BIOPSY Left 07/03/2012  . COLPOSCOPY    . DILATION AND CURETTAGE OF UTERUS    . TUBAL LIGATION      Family History  Problem Relation Age of Onset  . Diabetes Father   . Diabetes Sister   . Hypertension Sister   . Diabetes Brother   . Hypertension Brother   . Cancer Mother   . Other Neg Hx     Social History   Socioeconomic History  . Marital status: Legally Separated    Spouse name: Not on file  . Number of children: Not on file  . Years of education: Not on file  . Highest education level: Not on file  Occupational History  . Not on file  Social Needs  . Financial resource strain: Not on file  . Food insecurity:    Worry: Not on file    Inability: Not on file  . Transportation needs:     Medical: Not on file    Non-medical: Not on file  Tobacco Use  . Smoking status: Current Every Day Smoker    Packs/day: 1.00    Types: Cigarettes  . Smokeless tobacco: Former Network engineer and Sexual Activity  . Alcohol use: Yes    Comment: Quit Sep 02, 2016  . Drug use: No  . Sexual activity: Yes    Birth control/protection: Surgical  Lifestyle  . Physical activity:    Days per week: Not on file    Minutes per session: Not on file  . Stress: Not on file  Relationships  . Social connections:    Talks on phone: Not on file    Gets together: Not on file    Attends religious service: Not on file    Active member of club or organization: Not on file    Attends meetings of clubs or organizations: Not on file    Relationship status: Not on file  . Intimate partner violence:    Fear of current or ex partner: Not on file    Emotionally abused: Not on file    Physically abused: Not on file    Forced sexual activity: Not on file  Other Topics Concern  . Not on file  Social History Narrative   ** Merged History Encounter **          Review of systems: Review of Systems  Constitutional: Negative for fever and chills. Positive for fatigue HENT: Negative.   Eyes: Negative for blurred vision.  Respiratory: Negative for cough, shortness of breath and wheezing.   Cardiovascular: Negative for chest pain and palpitations.  Gastrointestinal: as per HPI Genitourinary: Negative for dysuria, urgency, frequency and hematuria.  Positive for urinary incontinence Musculoskeletal: Positive for myalgias, back pain and joint pain.  Skin: Negative for itching and rash.  Neurological: Negative for dizziness, tremors, focal weakness, seizures and loss of consciousness.  Endo/Heme/Allergies: Positive for seasonal allergies.  Psychiatric/Behavioral: Negative for depression, suicidal ideas and hallucinations. Positive for anxiety All other systems reviewed and are negative.   Physical  Exam: There were no vitals filed for this visit. There is no height or weight on file to calculate BMI. Gen:      No acute distress HEENT:  EOMI, sclera anicteric Neck:     No masses; no thyromegaly Lungs:    Clear to auscultation bilaterally; normal respiratory effort CV:         Regular rate and rhythm; no murmurs Abd:      + bowel sounds; soft, non-tender; no palpable masses, no distension Ext:    No edema; adequate peripheral perfusion Skin:      Warm and dry; no rash Neuro: alert and oriented x  3 Psych: normal mood and affect  Data Reviewed:  Reviewed labs, radiology imaging, old records and pertinent past GI work up  CT abdomen and pelvis February 23, 2018 Lower chest: 3 mm pulmonary nodule in the right middle lobe (axial image 1 of series 5), unchanged compared to 2014, considered definitively benign requiring no future imaging follow-up.  Hepatobiliary: Low-attenuation/hypoperfusion adjacent to the falciform ligament most evident in segment 4B, likely to represent a benign perfusion anomaly. No other definite suspicious appearing cystic or solid hepatic lesions. No intra or extrahepatic biliary ductal dilatation. Gallbladder is normal in appearance.  Pancreas: No pancreatic mass. No pancreatic ductal dilatation. No pancreatic or peripancreatic fluid or inflammatory changes.  Spleen: Unremarkable.  Adrenals/Urinary Tract: Mild left hydroureteronephrosis which appears related to extrinsic compression of the distal third of the left ureter (discussed below). Bilateral kidneys and adrenal glands are otherwise normal in appearance. Urinary bladder is normal in appearance.  Stomach/Bowel: Normal appearance of the stomach. No pathologic dilatation of small bowel or colon. However, there are areas throughout the mid to distal small bowel with profound mural thickening and extensive surrounding inflammatory changes. Terminal ileum does appear involved with mural  thickening and luminal narrowing. The most severe area of mural thickening appears to be in the proximal ileum or distal jejunum, best appreciated in the central aspect of the pelvis where there are extensive inflammatory changes in the small bowel mesentery, as well as extraluminal tracks which appear to reflect enteroenteric and/or enterocolonic fistulae, which appear to contain fecalized contents. This is best appreciated on coronal images 39-75 of series 6. Normal appendix.  Vascular/Lymphatic: Aortic atherosclerosis, without evidence of aneurysm or dissection in the abdominal or pelvic vasculature. No lymphadenopathy noted in the abdomen or pelvis.  Reproductive: Uterus and ovaries are unremarkable in appearance.  Other: Trace volume of ascites.  No pneumoperitoneum.  Musculoskeletal: There are no aggressive appearing lytic or blastic lesions noted in the visualized portions of the skeleton.  IMPRESSION: 1. There is a spectrum of findings suggestive of active Crohn's disease, as detailed above. In addition to extensive areas of mural thickening, mucosal hyperenhancement and surrounding inflammatory changes, there appears to be enteroenteric and/or enterocolonic fistulae, as discussed above. 2. Aortic atherosclerosis. 3. Additional incidental findings, as above.  Assessment and Plan/Recommendations:  52 year old female with complaints of abdominal pain, nausea, weight loss, decreased appetite CT abdomen pelvis with contrast done in the ER on August 9 showed findings concerning for possible Crohn's disease with extensive mural thickening and inflammatory changes in the small bowel, along with enteroenteric and entero colonic fistula Will obtain repeat imaging (CT abdomen pelvis with contrast )to exclude any intra-abdominal abscess and also assess disease activity Continue antibiotics, Cipro and Flagyl 500 mg twice daily for additional 7 days Continue prednisone taper  prescribed by ER physician , currently taking 30 mg daily.  Advised patient to taper down by only 5 mg every week instead of 10 mg every week. Scheduled for EGD and colonoscopy with biopsies to obtain tissue to confirm diagnosis of IBD and extent of disease CBC, CMP, ESR, CRP and TB QuantiFERON gold Return in 2 weeks or sooner if needed  The risks and benefits as well as alternatives of endoscopic procedure(s) have been discussed and reviewed. All questions answered. The patient agrees to proceed.   Damaris Hippo , MD [phone]    CC: No ref. provider found

## 2018-03-07 ENCOUNTER — Encounter: Payer: Self-pay | Admitting: Gastroenterology

## 2018-03-07 LAB — QUANTIFERON-TB GOLD PLUS
Mitogen-NIL: 6.49 IU/mL
NIL: 0.01 IU/mL
QuantiFERON-TB Gold Plus: NEGATIVE
TB1-NIL: 0 [IU]/mL
TB2-NIL: 0.01 [IU]/mL

## 2018-03-09 ENCOUNTER — Ambulatory Visit (INDEPENDENT_AMBULATORY_CARE_PROVIDER_SITE_OTHER)
Admission: RE | Admit: 2018-03-09 | Discharge: 2018-03-09 | Disposition: A | Discharge status: Home / Self Care | Payer: Self-pay | Source: Ambulatory Visit | Attending: Gastroenterology | Admitting: Gastroenterology

## 2018-03-09 ENCOUNTER — Encounter (HOSPITAL_COMMUNITY): Payer: Self-pay

## 2018-03-09 ENCOUNTER — Inpatient Hospital Stay (HOSPITAL_COMMUNITY)
Admission: EM | Admit: 2018-03-09 | Discharge: 2018-03-15 | DRG: 872 | Disposition: A | Discharge status: Home / Self Care | Payer: Self-pay | Source: Ambulatory Visit | Attending: Internal Medicine | Admitting: Internal Medicine

## 2018-03-09 ENCOUNTER — Other Ambulatory Visit: Payer: Self-pay

## 2018-03-09 DIAGNOSIS — A4151 Sepsis due to Escherichia coli [E. coli]: Principal | ICD-10-CM | POA: Diagnosis present

## 2018-03-09 DIAGNOSIS — Z8249 Family history of ischemic heart disease and other diseases of the circulatory system: Secondary | ICD-10-CM

## 2018-03-09 DIAGNOSIS — F419 Anxiety disorder, unspecified: Secondary | ICD-10-CM | POA: Diagnosis present

## 2018-03-09 DIAGNOSIS — K50914 Crohn's disease, unspecified, with abscess: Secondary | ICD-10-CM | POA: Diagnosis present

## 2018-03-09 DIAGNOSIS — K219 Gastro-esophageal reflux disease without esophagitis: Secondary | ICD-10-CM | POA: Diagnosis present

## 2018-03-09 DIAGNOSIS — D649 Anemia, unspecified: Secondary | ICD-10-CM | POA: Diagnosis present

## 2018-03-09 DIAGNOSIS — R1084 Generalized abdominal pain: Secondary | ICD-10-CM

## 2018-03-09 DIAGNOSIS — K651 Peritoneal abscess: Secondary | ICD-10-CM | POA: Diagnosis present

## 2018-03-09 DIAGNOSIS — Z720 Tobacco use: Secondary | ICD-10-CM

## 2018-03-09 DIAGNOSIS — J439 Emphysema, unspecified: Secondary | ICD-10-CM | POA: Diagnosis present

## 2018-03-09 DIAGNOSIS — Z599 Problem related to housing and economic circumstances, unspecified: Secondary | ICD-10-CM

## 2018-03-09 DIAGNOSIS — R112 Nausea with vomiting, unspecified: Secondary | ICD-10-CM

## 2018-03-09 DIAGNOSIS — Z885 Allergy status to narcotic agent status: Secondary | ICD-10-CM

## 2018-03-09 DIAGNOSIS — N739 Female pelvic inflammatory disease, unspecified: Secondary | ICD-10-CM | POA: Diagnosis present

## 2018-03-09 DIAGNOSIS — G35 Multiple sclerosis: Secondary | ICD-10-CM | POA: Diagnosis present

## 2018-03-09 DIAGNOSIS — R9389 Abnormal findings on diagnostic imaging of other specified body structures: Secondary | ICD-10-CM

## 2018-03-09 DIAGNOSIS — K632 Fistula of intestine: Secondary | ICD-10-CM

## 2018-03-09 DIAGNOSIS — Z833 Family history of diabetes mellitus: Secondary | ICD-10-CM

## 2018-03-09 DIAGNOSIS — I10 Essential (primary) hypertension: Secondary | ICD-10-CM | POA: Diagnosis present

## 2018-03-09 DIAGNOSIS — A419 Sepsis, unspecified organism: Secondary | ICD-10-CM

## 2018-03-09 DIAGNOSIS — Z809 Family history of malignant neoplasm, unspecified: Secondary | ICD-10-CM

## 2018-03-09 DIAGNOSIS — J449 Chronic obstructive pulmonary disease, unspecified: Secondary | ICD-10-CM

## 2018-03-09 DIAGNOSIS — F17210 Nicotine dependence, cigarettes, uncomplicated: Secondary | ICD-10-CM | POA: Diagnosis present

## 2018-03-09 DIAGNOSIS — K50913 Crohn's disease, unspecified, with fistula: Secondary | ICD-10-CM | POA: Diagnosis present

## 2018-03-09 DIAGNOSIS — R197 Diarrhea, unspecified: Secondary | ICD-10-CM

## 2018-03-09 HISTORY — DX: Sepsis due to Escherichia coli (e. coli): A41.51

## 2018-03-09 LAB — COMPREHENSIVE METABOLIC PANEL
ALBUMIN: 3.1 g/dL — AB (ref 3.5–5.0)
ALT: 17 U/L (ref 0–44)
AST: 25 U/L (ref 15–41)
Alkaline Phosphatase: 45 U/L (ref 38–126)
Anion gap: 12 (ref 5–15)
BUN: 8 mg/dL (ref 6–20)
CHLORIDE: 99 mmol/L (ref 98–111)
CO2: 27 mmol/L (ref 22–32)
Calcium: 8.7 mg/dL — ABNORMAL LOW (ref 8.9–10.3)
Creatinine, Ser: 0.51 mg/dL (ref 0.44–1.00)
GFR calc Af Amer: >60 mL/min (ref >60)
GFR calc non Af Amer: >60 mL/min (ref >60)
GLUCOSE: 100 mg/dL — AB (ref 70–99)
POTASSIUM: 3.6 mmol/L (ref 3.5–5.1)
Sodium: 138 mmol/L (ref 135–145)
Total Bilirubin: 0.7 mg/dL (ref 0.3–1.2)
Total Protein: 6.5 g/dL (ref 6.5–8.1)

## 2018-03-09 LAB — CBC WITH DIFFERENTIAL/PLATELET
BASOS ABS: 0 10*3/uL (ref 0.0–0.1)
Basophils Relative: 0 %
EOS ABS: 0 10*3/uL (ref 0.0–0.7)
Eosinophils Relative: 0 %
HCT: 38.7 % (ref 36.0–46.0)
Hemoglobin: 12.7 g/dL (ref 12.0–15.0)
LYMPHS ABS: 2.2 10*3/uL (ref 0.7–4.0)
Lymphocytes Relative: 8 %
MCH: 31.9 pg (ref 26.0–34.0)
MCHC: 32.8 g/dL (ref 30.0–36.0)
MCV: 97.2 fL (ref 78.0–100.0)
MONO ABS: 1.4 10*3/uL — AB (ref 0.1–1.0)
Monocytes Relative: 5 %
Neutro Abs: 23.5 10*3/uL — ABNORMAL HIGH (ref 1.7–7.7)
Neutrophils Relative %: 87 %
PLATELETS: 379 10*3/uL (ref 150–400)
RBC: 3.98 MIL/uL (ref 3.87–5.11)
RDW: 15.8 % — AB (ref 11.5–15.5)
WBC: 27.1 10*3/uL — ABNORMAL HIGH (ref 4.0–10.5)

## 2018-03-09 LAB — URINALYSIS, ROUTINE W REFLEX MICROSCOPIC
Bilirubin Urine: NEGATIVE
GLUCOSE, UA: NEGATIVE mg/dL
Hgb urine dipstick: NEGATIVE
Ketones, ur: NEGATIVE mg/dL
LEUKOCYTES UA: NEGATIVE
NITRITE: NEGATIVE
PROTEIN: NEGATIVE mg/dL
Specific Gravity, Urine: 1.028 (ref 1.005–1.030)
pH: 8 (ref 5.0–8.0)

## 2018-03-09 LAB — I-STAT CG4 LACTIC ACID, ED: Lactic Acid, Venous: 1.73 mmol/L (ref 0.5–1.9)

## 2018-03-09 LAB — LIPASE, BLOOD: Lipase: 26 U/L (ref 11–51)

## 2018-03-09 MED ORDER — ACETAMINOPHEN 650 MG RE SUPP: 650.0000 mg | Freq: Four times a day (QID) | RECTAL | Status: DC | PRN

## 2018-03-09 MED ORDER — ACETAMINOPHEN 325 MG PO TABS: 650.0000 mg | ORAL_TABLET | Freq: Four times a day (QID) | ORAL | Status: DC | PRN

## 2018-03-09 MED ORDER — ACETAMINOPHEN 325 MG PO TABS
650.0000 mg | ORAL_TABLET | Freq: Once | ORAL | Status: AC
  Administered 2018-03-09: 650 mg via ORAL
  Filled 2018-03-09: qty 2

## 2018-03-09 MED ORDER — MORPHINE SULFATE (PF) 2 MG/ML IV SOLN
2.0000 mg | INTRAVENOUS | Status: DC | PRN
  Administered 2018-03-10 – 2018-03-14 (×11): 2 mg via INTRAVENOUS
  Filled 2018-03-09 (×11): qty 1

## 2018-03-09 MED ORDER — ONDANSETRON HCL 4 MG/2ML IJ SOLN: 4.0000 mg | Freq: Four times a day (QID) | INTRAMUSCULAR | Status: DC | PRN

## 2018-03-09 MED ORDER — MORPHINE SULFATE (PF) 4 MG/ML IV SOLN
4.0000 mg | Freq: Once | INTRAVENOUS | Status: AC
  Administered 2018-03-09: 4 mg via INTRAVENOUS
  Filled 2018-03-09: qty 1

## 2018-03-09 MED ORDER — ONDANSETRON HCL 4 MG/2ML IJ SOLN
4.0000 mg | Freq: Once | INTRAMUSCULAR | Status: AC
  Administered 2018-03-09: 4 mg via INTRAVENOUS
  Filled 2018-03-09: qty 2

## 2018-03-09 MED ORDER — IOPAMIDOL (ISOVUE-300) INJECTION 61%: 100.0000 mL | Freq: Once | INTRAVENOUS | Status: DC | PRN

## 2018-03-09 MED ORDER — IOPAMIDOL (ISOVUE-300) INJECTION 61%
100.0000 mL | Freq: Once | INTRAVENOUS | Status: AC | PRN
  Administered 2018-03-09: 100 mL via INTRAVENOUS

## 2018-03-09 MED ORDER — METHYLPREDNISOLONE SODIUM SUCC 40 MG IJ SOLR
40.0000 mg | Freq: Two times a day (BID) | INTRAMUSCULAR | Status: DC
  Administered 2018-03-09 – 2018-03-11 (×4): 40 mg via INTRAVENOUS
  Filled 2018-03-09 (×4): qty 1

## 2018-03-09 MED ORDER — ONDANSETRON HCL 4 MG PO TABS: 4.0000 mg | ORAL_TABLET | Freq: Four times a day (QID) | ORAL | Status: DC | PRN

## 2018-03-09 MED ORDER — ENOXAPARIN SODIUM 40 MG/0.4ML ~~LOC~~ SOLN: 40.0000 mg | SUBCUTANEOUS | Status: DC

## 2018-03-09 MED ORDER — PIPERACILLIN-TAZOBACTAM 3.375 G IVPB
3.3750 g | Freq: Three times a day (TID) | INTRAVENOUS | Status: DC
  Administered 2018-03-09 – 2018-03-12 (×8): 3.375 g via INTRAVENOUS
  Filled 2018-03-09 (×8): qty 50

## 2018-03-09 MED ORDER — PIPERACILLIN-TAZOBACTAM 3.375 G IVPB 30 MIN
3.3750 g | Freq: Once | INTRAVENOUS | Status: AC
  Administered 2018-03-09: 3.375 g via INTRAVENOUS
  Filled 2018-03-09: qty 50

## 2018-03-09 MED ORDER — NICOTINE 21 MG/24HR TD PT24
21.0000 mg | MEDICATED_PATCH | Freq: Every day | TRANSDERMAL | Status: DC
  Administered 2018-03-09 – 2018-03-15 (×6): 21 mg via TRANSDERMAL
  Filled 2018-03-09 (×6): qty 1

## 2018-03-09 MED ORDER — OXYCODONE HCL 5 MG PO TABS: 5.0000 mg | ORAL_TABLET | ORAL | Status: DC | PRN

## 2018-03-09 MED ORDER — SODIUM CHLORIDE 0.9 % IV BOLUS
1000.0000 mL | Freq: Once | INTRAVENOUS | Status: AC
  Administered 2018-03-09: 1000 mL via INTRAVENOUS

## 2018-03-09 MED ORDER — ALBUTEROL SULFATE (2.5 MG/3ML) 0.083% IN NEBU: 2.5000 mg | INHALATION_SOLUTION | RESPIRATORY_TRACT | Status: DC | PRN

## 2018-03-09 MED ORDER — SODIUM CHLORIDE 0.9 % IV SOLN
INTRAVENOUS | Status: DC
  Administered 2018-03-09 – 2018-03-12 (×7): via INTRAVENOUS

## 2018-03-09 NOTE — Plan of Care (Signed)
Pt stable with no needs. No changes to note. Pt is anxious about procedure tomorrow. Pt knows she is npo after midnight.

## 2018-03-09 NOTE — Consult Note (Signed)
[facility] Surgery Consult Note  Christina Miller 1965-10-23  [id number].    Requesting MD: Tomi Bamberger Chief Complaint/Reason for Consult: Possible Crohn's with abscess HPI:  Patient is a 52 year old female who presented to [facility] after outpatient CT scan ordered by [name] of [facility]. Patient has a 4-6 week history of cramping abdominal pain, diarrhea and vomiting. Was constipated prior to all of this and then developed abdominal pain and diarrhea. Went to [facility] hospital 02/15/18 for abdominal cramping and got a pelvic US which showed moderate amount of free peritoneal fluid. Went to [facility] ER 02/23/18 and had a CT which showed small bowel inflammation with mural thickening and inflammation of adjacent mesentery, also evidence of enteroenteric and enterocolic fistulas. She was discharged from ED on abx and steroid taper. Saw [name] in the office 03/05/18. Initially felt better after starting medications. Patient was eating mostly full liquids and some soft foods. She reports that she was no longer having loose diarrhea but having soft, non-bloody stools daily. She still had some nausea but no vomiting. This morning after drinking the contrast for her CT scan she developed severe abdominal cramping, chills and fever. CT scan today showed evidence of fistulous connections, some improvement in inflammation at distal and terminal ileum but persistent inflammation of mid-distal small bowel and sigmoid colon. Patient was scheduled for colonoscopy on Monday, she has never had one previously.   Patient denies history of Crohn's-like symptoms when she was younger. PMH significant for untreated MS diagnosed in 2004, COPD, and GERD. Patient is not on any blood thinning medications. She is allergic to hydrocodone. Past abdominal surgeries include tubal ligation. She is a current smoker and smokes 1/2-1 ppd. Reports she generally does not drink alcohol but has on a few occasions in the last several months.  Denies illicit drug use.   ROS: Review of Systems  Constitutional: Positive for chills, fever, malaise/fatigue and weight loss.  Respiratory: Negative for shortness of breath and wheezing.   Cardiovascular: Negative for chest pain and palpitations.  Gastrointestinal: Positive for abdominal pain, nausea and vomiting. Negative for blood in stool, constipation, diarrhea and melena.  Genitourinary: Positive for frequency. Negative for dysuria and urgency.  All other systems reviewed and are negative.   Family History  Problem Relation Age of Onset  . Diabetes Father   . Diabetes Sister   . Hypertension Sister   . Diabetes Brother   . Hypertension Brother   . Cancer Mother   . Other Neg Hx     Past Medical History:  Diagnosis Date  . Abnormal Pap smear    cryo  . Anemia   . Bone fracture    ankle  . COPD (chronic obstructive pulmonary disease) ([place])   . Emphysema   . GERD (gastroesophageal reflux disease)   . Headache(784.0)   . Hypertension   . Infection    urinary tract infection  . MS (multiple sclerosis) ([place])     Past Surgical History:  Procedure Laterality Date  . ARM WOUND REPAIR / CLOSURE    . BREAST BIOPSY Left 07/03/2012  . COLPOSCOPY    . DILATION AND CURETTAGE OF UTERUS    . TUBAL LIGATION      Social History:  reports that she has been smoking cigarettes. She has been smoking about 1.00 pack per day. She has quit using smokeless tobacco. She reports that she drinks alcohol. She reports that she does not use drugs.  Allergies:  Allergies  Allergen Reactions  .  Hydrocodone-Acetaminophen Nausea And Vomiting     (Not in a hospital admission)  Blood pressure 102/81, pulse (!) 108, temperature (!) 100.7 F (38.2 C), temperature source Rectal, resp. rate 20, height _0  (1.575 m), weight 51.3 kg, last menstrual period 06/20/2012, SpO2 90 %. Physical Exam: Physical Exam  Constitutional: She is oriented to person, place, and time. She appears  well-developed and well-nourished. She is cooperative.  Non-toxic appearance. No distress.  HENT:  Head: Normocephalic and atraumatic.  Right Ear: External ear normal.  Left Ear: External ear normal.  Nose: Nose normal.  Mouth/Throat: Mucous membranes are normal. Abnormal dentition.  Eyes: Conjunctivae, EOM and lids are normal. No scleral icterus.  Pupils equal and round   Neck: Normal range of motion. Neck supple.  Cardiovascular: Normal rate and regular rhythm.  Pulses:      Radial pulses are 2+ on the right side, and 2+ on the left side.       Dorsalis pedis pulses are 2+ on the right side, and 2+ on the left side.  No LE edema  Pulmonary/Chest: Effort normal. She has wheezes (bilaterally ).  Abdominal: Soft. Bowel sounds are normal. She exhibits distension. There is generalized tenderness and tenderness in the suprapubic area. There is rebound. There is no rigidity and no guarding. No hernia.  Musculoskeletal:  ROM grossly intact in bilateral upper and lower extremities  Neurological: She is alert and oriented to person, place, and time.  Skin: Skin is warm, dry and intact. She is not diaphoretic.  Psychiatric: Her speech is normal and behavior is normal. Her mood appears anxious.    Results for orders placed or performed during the hospital encounter of 03/09/18 (from the past 48 hour(s))  CBC with Differential     Status: Abnormal (Preliminary result)   Collection Time: 03/09/18  1:15 PM  Result Value Ref Range   WBC 27.1 (H) 4.0 - 10.5 K/uL   RBC 3.98 3.87 - 5.11 MIL/uL   Hemoglobin 12.7 12.0 - 15.0 g/dL   HCT 38.7 36.0 - 46.0 %   MCV 97.2 78.0 - 100.0 fL   MCH 31.9 26.0 - 34.0 pg   MCHC 32.8 30.0 - 36.0 g/dL   RDW 15.8 (H) 11.5 - 15.5 %   Platelets 379 150 - 400 K/uL    Comment: Performed at [facility], [place] [address], [place], [place] [address]   Neutrophils Relative % PENDING %   Neutro Abs PENDING 1.7 - 7.7 K/uL   Band Neutrophils PENDING %    Lymphocytes Relative PENDING %   Lymphs Abs PENDING 0.7 - 4.0 K/uL   Monocytes Relative PENDING %   Monocytes Absolute PENDING 0.1 - 1.0 K/uL   Eosinophils Relative PENDING %   Eosinophils Absolute PENDING 0.0 - 0.7 K/uL   Basophils Relative PENDING %   Basophils Absolute PENDING 0.0 - 0.1 K/uL   WBC Morphology PENDING    RBC Morphology PENDING    Smear Review PENDING    nRBC PENDING 0 /100 WBC   Metamyelocytes Relative PENDING %   Myelocytes PENDING %   Promyelocytes Relative PENDING %   Blasts PENDING %  Comprehensive metabolic panel     Status: Abnormal   Collection Time: 03/09/18  1:15 PM  Result Value Ref Range   Sodium 138 135 - 145 mmol/L   Potassium 3.6 3.5 - 5.1 mmol/L   Chloride 99 98 - 111 mmol/L   CO2 27 22 - 32 mmol/L   Glucose, Bld  100 (H) 70 - 99 mg/dL   BUN 8 6 - 20 mg/dL   Creatinine, Ser 0.51 0.44 - 1.00 mg/dL   Calcium 8.7 (L) 8.9 - 10.3 mg/dL   Total Protein 6.5 6.5 - 8.1 g/dL   Albumin 3.1 (L) 3.5 - 5.0 g/dL   AST 25 15 - 41 U/L   ALT 17 0 - 44 U/L   Alkaline Phosphatase 45 38 - 126 U/L   Total Bilirubin 0.7 0.3 - 1.2 mg/dL   GFR calc non Af Amer >60 >60 mL/min   GFR calc Af Amer >60 >60 mL/min    Comment: (NOTE) The eGFR has been calculated using the CKD EPI equation. This calculation has not been validated in all clinical situations. eGFR's persistently <60 mL/min signify possible Chronic Kidney Disease.    Anion gap 12 5 - 15    Comment: Performed at [facility], [place] [address], [place], [place] [address]  Lipase, blood     Status: None   Collection Time: 03/09/18  1:15 PM  Result Value Ref Range   Lipase 26 11 - 51 U/L    Comment: Performed at [facility], [place] [address], [place], [place] [address]  I-Stat CG4 Lactic Acid, ED     Status: None   Collection Time: 03/09/18  1:45 PM  Result Value Ref Range   Lactic Acid, Venous 1.73 0.5 - 1.9 mmol/L   Ct Abdomen Pelvis W Contrast  Result Date:  03/09/2018 CLINICAL DATA:  52 year old female with history of 46 weeks of abdominal cramping, nausea, vomiting and diarrhea. Lethargy. Possible history of diverticulosis. Possible history of Crohn's disease. EXAM: CT ABDOMEN AND PELVIS WITH CONTRAST TECHNIQUE: Multidetector CT imaging of the abdomen and pelvis was performed using the standard protocol following bolus administration of intravenous contrast. CONTRAST:  144m ISOVUE-300 IOPAMIDOL (ISOVUE-300) INJECTION 61% COMPARISON:  CT the abdomen and pelvis 02/23/2018. FINDINGS: Lower chest: Unremarkable. Hepatobiliary: No cystic or solid hepatic lesions. No intra or extrahepatic biliary ductal dilatation. Gallbladder is normal in appearance. Pancreas: No pancreatic mass. No pancreatic ductal dilatation. No pancreatic or peripancreatic fluid or inflammatory changes. Spleen: Unremarkable. Adrenals/Urinary Tract: Bilateral kidneys and adrenal glands are normal in appearance. There is no hydroureteronephrosis. Urinary bladder is normal in appearance. Stomach/Bowel: Normal appearance of the stomach. No pathologic dilatation of small bowel or colon. When compared to the prior examination, the previously noted mural thickening and luminal narrowing associated with the distal and terminal ileum has resolved. There continues to be some mural thickening and mucosal hyperenhancement with luminal narrowing in the proximal ileum or distal jejunum, best appreciated on axial image 55 of series 2 and coronal image 40 of series 6. There is also severe thickening of the sigmoid colon. Adjacent to the sigmoid colon and intimately associated with adjacent loops of small bowel (best appreciated on coronal images 42-67 of series 6) there are again complex extraluminal collections of what appears to be feculent material extending between portions of sigmoid colon and adjacent loops of mid to distal small bowel, likely to reflect the presence of enteroenteric and/or enterocolonic  fistulae. These fistulous tracts and the surrounding inflammation appears more extensive than the prior examination. These tracts are highly irregular in shape and therefore difficult to accurately measure, however, the largest of these currently measures approximately 6.3 x 4.8 x 4.8 cm (axial image 62 of series 2 and coronal image 52 of series 6). In addition, there is a rim enhancing fluid collection in the low  left anatomic pelvis (axial image 66 of series 2 and coronal image 46 of series 6) which currently measures 2.2 x 1.8 x 1.7 cm, compatible with a small abscess. Normal appendix. Vascular/Lymphatic: Aortic atherosclerosis, without evidence of aneurysm or dissection in the abdominal or pelvic vasculature. No lymphadenopathy noted in the abdomen or pelvis. Reproductive: Uterus and ovaries are unremarkable in appearance. Other: Trace volume of ascites.  No pneumoperitoneum. Musculoskeletal: No aggressive appearing lytic or blastic lesions are noted in the visualized portions of the skeleton. IMPRESSION: 1. Although there has been resolution of the inflammatory changes involving the distal and terminal ileum, there continues to be extensive areas of mural thickening involving mid to distal small bowel, as well as the sigmoid colon. In the low anatomic pelvis there are worsening areas which appear to reflect enteroenteric and/or enterocolonic fistulae, as well as a developing abscess in the low left hemipelvis, as detailed above. Surgical consultation is recommended. 2. Aortic atherosclerosis. 3. Additional incidental findings, as above. These results will be called to the ordering clinician or representative by the Radiologist Assistant, and communication documented in the PACS or zVision Dashboard. Electronically Signed   By: Vinnie Langton M.D.   On: 03/09/2018 11:24      Assessment/Plan Untreated multiple sclerosis COPD GERD  Inflammatory bowel disease with pelvic abscess - in the process of  being worked up for [employer] disease - IR consult for possible drainage of pelvic abscess - WBC 27.1, Tmax 100.7, mildly tachycardic - continue IV abx and bowel rest - could have ice chips and few sips of clears from a surgical standpoint but leave NPO until IR sees - no indication for acute surgical intervention right now but we will continue to follow closely  FEN: NPO, IVF VTE: SCDs ID: [email or web address] 8/23>>  Brigid Re, [facility] Surgery 03/09/2018, 2:55 PM Pager: [phone] Consults: [phone] Mon-Fri 7:00 am-4:30 pm Sat-Sun 7:00 am-11:30 am

## 2018-03-09 NOTE — ED Notes (Signed)
Report given to [facility], Therapist, sports.

## 2018-03-09 NOTE — H&P (Addendum)
History and Physical    Magin Balbi  IEP:[id number]  DOB: 13-May-1966  DOA: 03/09/2018 PCP: Patient, No Pcp Per   Patient coming from: home  Chief Complaint: abdominal pain  HPI: Yvett Rossel is a 52 y.o. female with medical history of possibly with complaints of abdominal pain, nausea, vomiting for 3-4 weeks now. She underwent a CT on 8/9 in the ED which was suspicious for Chron's disease and showed enteroenteric and enterocolonic fistulae. She was given a course of Cipro/ Flagyl and a Prednisone taper for 4 wks. She was resumed on Cipro and Flagyl on Monday of this week.  She followed up with GI underwent a repeat CT abd/pelvis today which was found to have improving inflammation but worsening of the fistulae and possibly a pelvic abscess.  Overall her symptoms were getting better. She is having about 2 soft BMs a day and not vomiting much anymore. Today, however she felt very weak and had chills prior to going in to have the CT scan done. No vomiting or diarrhea today.     She has a fever of 100.7 in the ED.   ED Course:  Given 1 L NS and Zosyn in the ED. BP 107/88, temp 100.7 WBC 27.1  Review of Systems:  Weight loss of about 5-10 lb in the past few weeks. All other systems reviewed and apart from HPI, are negative.  Past Medical History:  Diagnosis Date  . Abnormal Pap smear    cryo  . Anemia   . Bone fracture    ankle  . COPD (chronic obstructive pulmonary disease) ([place])   . Emphysema   . GERD (gastroesophageal reflux disease)   . Headache(784.0)   . Hypertension   . Infection    urinary tract infection  . MS (multiple sclerosis) ([place])     Past Surgical History:  Procedure Laterality Date  . ARM WOUND REPAIR / CLOSURE    . BREAST BIOPSY Left 07/03/2012  . COLPOSCOPY    . DILATION AND CURETTAGE OF UTERUS    . TUBAL LIGATION      Social History:  Reports that she has been smoking cigarettes. She has been smoking about 1.00 pack per day. She has quit  using smokeless tobacco. She reports that she drinks alcohol occasionally. She reports that she does not use drugs.  Allergies  Allergen Reactions  . Hydrocodone-Acetaminophen Nausea And Vomiting    Family History  Problem Relation Age of Onset  . Diabetes Father   . Diabetes Sister   . Hypertension Sister   . Diabetes Brother   . Hypertension Brother   . Cancer Mother   . Other Neg Hx      Prior to Admission medications   Medication Sig Start Date End Date Taking? Authorizing Provider  ciprofloxacin (CIPRO) 500 MG tablet Take 1 tablet (500 mg total) by mouth 2 (two) times daily. 03/05/18  Yes Nandigam, Venia Minks, MD  ibuprofen (ADVIL,MOTRIN) 200 MG tablet Take 800 mg by mouth every 6 (six) hours as needed for moderate pain.   Yes [provider]  metroNIDAZOLE (FLAGYL) 500 MG tablet Take 1 tablet (500 mg total) by mouth 2 (two) times daily. 03/05/18  Yes Nandigam, Venia Minks, MD  predniSONE (DELTASONE) 10 MG tablet 40 mg daily x 6 days, 30 mg daily x 7 days, 20 mg daily x 7 days then 10 mg daily x 7 days Patient taking differently: Take 10-40 mg by mouth See admin instructions. Take 40 mg daily x 6  days, 30 mg daily x 7 days, 20 mg daily x 7 days then 10 mg daily x 7 days 02/24/18  Yes Sherwood Gambler, MD    Physical Exam: Wt Readings from Last 3 Encounters:  03/09/18 51.3 kg  03/05/18 51.4 kg  02/23/18 53.1 kg   Vitals:   03/09/18 1353 03/09/18 1354 03/09/18 1517 03/09/18 1600  BP:  102/81 107/88   Pulse:  (!) 108 98 90  Resp:   18 14  Temp:  (!) 100.7 F (38.2 C)    TempSrc:  Rectal    SpO2:  90% 94% 95%  Weight: 51.3 kg     Height: 5' 2"  (1.575 m)         Constitutional:  Calm & comfortable Eyes: PERRLA, lids and conjunctivae normal ENT:  Mucous membranes are moist.  Pharynx clear of exudate   Normal dentition.  Neck: Supple, no masses  Respiratory:  Clear to auscultation bilaterally  Normal respiratory effort.  Cardiovascular:  S1 & S2 heard,  regular rate and rhythm No Murmurs Abdomen:  Non distended Tenderness in left abdomen,mostly LLQ, no rebound tenderness, No masses Bowel sounds normal Extremities:  No clubbing / cyanosis No pedal edema No joint deformity    Skin:  No rashes, lesions or ulcers Neurologic:  AAO x 3 CN 2-12 grossly intact Sensation intact Strength 5/5 in all 4 extremities Psychiatric:  Depressed, crying about being in the hospital    Labs on Admission: I have personally reviewed following labs and imaging studies  CBC: Recent Labs  Lab 03/05/18 1446 03/09/18 1315  WBC 19.1* 27.1*  NEUTROABS 17.3* PENDING  HGB 13.2 12.7  HCT 40.6 38.7  MCV 96.4 97.2  PLT 414.0* 229   Basic Metabolic Panel: Recent Labs  Lab 03/05/18 1446 03/09/18 1315  NA 137 138  K 3.6 3.6  CL 100 99  CO2 29 27  GLUCOSE 156* 100*  BUN 16 8  CREATININE 0.70 0.51  CALCIUM 9.3 8.7*   GFR: Estimated Creatinine Clearance: 64.3 mL/min (by C-G formula based on SCr of 0.51 mg/dL). Liver Function Tests: Recent Labs  Lab 03/05/18 1446 03/09/18 1315  AST 23 25  ALT 20 17  ALKPHOS 62 45  BILITOT 0.6 0.7  PROT 7.1 6.5  ALBUMIN 3.6 3.1*   Recent Labs  Lab 03/09/18 1315  LIPASE 26   No results for input(s): AMMONIA in the last 168 hours. Coagulation Profile: No results for input(s): INR, PROTIME in the last 168 hours. Cardiac Enzymes: No results for input(s): CKTOTAL, CKMB, CKMBINDEX, TROPONINI in the last 168 hours. BNP (last 3 results) No results for input(s): PROBNP in the last 8760 hours. HbA1C: No results for input(s): HGBA1C in the last 72 hours. CBG: No results for input(s): GLUCAP in the last 168 hours. Lipid Profile: No results for input(s): CHOL, HDL, LDLCALC, TRIG, CHOLHDL, LDLDIRECT in the last 72 hours. Thyroid Function Tests: No results for input(s): TSH, T4TOTAL, FREET4, T3FREE, THYROIDAB in the last 72 hours. Anemia Panel: No results for input(s): VITAMINB12, FOLATE, FERRITIN, TIBC,  IRON, RETICCTPCT in the last 72 hours. Urine analysis:    Component Value Date/Time   COLORURINE AMBER (A) 02/23/2018 2039   APPEARANCEUR CLOUDY (A) 02/23/2018 2039   APPEARANCEUR Hazy 03/20/2014 0201   LABSPEC 1.024 02/23/2018 2039   LABSPEC 1.015 03/20/2014 0201   PHURINE 5.0 02/23/2018 2039   GLUCOSEU NEGATIVE 02/23/2018 2039   GLUCOSEU Negative 03/20/2014 0201   HGBUR SMALL (A) 02/23/2018 2039   BILIRUBINUR SMALL (A)  02/23/2018 2039   BILIRUBINUR Negative 03/20/2014 0201   KETONESUR 80 (A) 02/23/2018 2039   PROTEINUR 30 (A) 02/23/2018 2039   UROBILINOGEN 1.0 11/04/2012 1822   NITRITE NEGATIVE 02/23/2018 2039   LEUKOCYTESUR TRACE (A) 02/23/2018 2039   LEUKOCYTESUR Negative 03/20/2014 0201   Sepsis Labs: @LABRCNTIP (procalcitonin:4,lacticidven:4) )No results found for this or any previous visit (from the past 240 hour(s)).   Radiological Exams on Admission: Ct Abdomen Pelvis W Contrast  Result Date: 03/09/2018 CLINICAL DATA:  51 year old female with history of 46 weeks of abdominal cramping, nausea, vomiting and diarrhea. Lethargy. Possible history of diverticulosis. Possible history of Crohn's disease. EXAM: CT ABDOMEN AND PELVIS WITH CONTRAST TECHNIQUE: Multidetector CT imaging of the abdomen and pelvis was performed using the standard protocol following bolus administration of intravenous contrast. CONTRAST:  137m ISOVUE-300 IOPAMIDOL (ISOVUE-300) INJECTION 61% COMPARISON:  CT the abdomen and pelvis 02/23/2018. FINDINGS: Lower chest: Unremarkable. Hepatobiliary: No cystic or solid hepatic lesions. No intra or extrahepatic biliary ductal dilatation. Gallbladder is normal in appearance. Pancreas: No pancreatic mass. No pancreatic ductal dilatation. No pancreatic or peripancreatic fluid or inflammatory changes. Spleen: Unremarkable. Adrenals/Urinary Tract: Bilateral kidneys and adrenal glands are normal in appearance. There is no hydroureteronephrosis. Urinary bladder is normal in  appearance. Stomach/Bowel: Normal appearance of the stomach. No pathologic dilatation of small bowel or colon. When compared to the prior examination, the previously noted mural thickening and luminal narrowing associated with the distal and terminal ileum has resolved. There continues to be some mural thickening and mucosal hyperenhancement with luminal narrowing in the proximal ileum or distal jejunum, best appreciated on axial image 55 of series 2 and coronal image 40 of series 6. There is also severe thickening of the sigmoid colon. 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 These fistulous tracts and the surrounding inflammation appears more extensive than the prior examination. These tracts are highly irregular in shape and therefore difficult to accurately measure, however, the largest of these currently measures approximately 6.3 x 4.8 x 4.8 cm (axial image 62 of series 2 and coronal image 52 of series 6). In addition, there is a rim enhancing fluid collection in the low left anatomic pelvis (axial image 66 of series 2 and coronal image 46 of series 6) which currently measures 2.2 x 1.8 x 1.7 cm, compatible with a small abscess. Normal appendix. Vascular/Lymphatic: Aortic atherosclerosis, without evidence of aneurysm or dissection in the abdominal or pelvic vasculature. No lymphadenopathy noted in the abdomen or pelvis. Reproductive: Uterus and ovaries are unremarkable in appearance. Other: Trace volume of ascites.  No pneumoperitoneum. Musculoskeletal: No aggressive appearing lytic or blastic lesions are noted in the visualized portions of the  skeleton. IMPRESSION: 1. Although there has been resolution of the inflammatory changes involving the distal and terminal ileum, there continues to be extensive areas of mural thickening involving mid to distal small bowel, as well as the sigmoid colon. In the low anatomic pelvis there are worsening areas which appear to reflect enteroenteric and/or enterocolonic fistulae, as well as a developing abscess in the low left hemipelvis, as detailed above. Surgical consultation is recommended. 2. Aortic atherosclerosis. 3. Additional incidental findings, as above. These results will be called  to the ordering clinician or representative by the Radiologist Assistant, and communication documented in the PACS or zVision Dashboard. Electronically Signed   By: Vinnie Langton M.D.   On: 03/09/2018 11:24    EKG: Independently reviewed. Sinus tach at 104 bpm  Assessment/Plan Principal Problem:   Abdominal infection/  Sepsis  - currently suspicion by GI is that she may have Chron's disease - she is on a 2nd course of Cipro and Flagyl but still febrile and now has a new small abscess in addition to prior noted fistulae - continue Zosyn, increase steroids to Solumedrol 40 mg BID - f/u blood cultures - clear liquids, continuous IVF ordered - pain and nausea control  - gen surgery and GI consults Velora Heckler) requested.  - IR guided percutaneous drainage requested by gen surgery  Active Problems:   Tobacco abuse - nicotine patch ordered- I have counseled her to quit    COPD (chronic obstructive pulmonary disease)  - she has been told in the past that she does have COPD - she states she has not needed any inhalers lately - no wheezing noted - will order PRN Albuterol in case it is needed   DVT prophylaxis: Lovenox  Code Status: Full code Family Communication:   Disposition Plan: admit to med surg  Consults called: Gen surgery and [place] [facility] by ED  Admission status: inpatient    Debbe Odea MD [facility] Pager: [email or web address] Password [email or web address] 7PM-7AM, please contact night-coverage   03/09/2018, 4:05 PM

## 2018-03-09 NOTE — ED Provider Notes (Signed)
[place] DEPT Provider Note   CSN: [id number] Arrival date & time: 03/09/18  1227     History   Chief Complaint Chief Complaint  Patient presents with  . abnormal CT  . Abdominal Pain    HPI Christina Miller is a 52 y.o. female with history of untreated MS, emphysema, GERD, hypertension is here for evaluation of abnormal CT abdomen/pelvis, was called by GI MD who advised her to come to ER.  Patient endorses persistence, severe abdominal cramping to the lower abdomen for 2 weeks.  Associated with nausea, nonbilious nonbloody emesis, feeling more tired, weak, cold chills, fevers.  Yesterday noticed her urine was a very dark brown.  She is passing a lot of flatus, having daily soft bowel movements in the mornings.  Has noticed urinary frequency that she attributes to drinking a lot of fluids.  Was seen in the ER recently x2 and followed up by GI.  She has been compliant with ciprofloxacin, Flagyl and prednisone.  Last CT A/P on 8/9 showed significant small bowel inflammation, mural thickening and fistulae concerning for possible Chron's disease.  She has been scheduled for EGD and colonoscopy with biopsies to confirm diagnosis of IBD.  Last PO intake 11 am today.  HPI  Past Medical History:  Diagnosis Date  . Abnormal Pap smear    cryo  . Anemia   . Bone fracture    ankle  . COPD (chronic obstructive pulmonary disease) ([place])   . Emphysema   . GERD (gastroesophageal reflux disease)   . Headache(784.0)   . Hypertension   . Infection    urinary tract infection  . MS (multiple sclerosis) [facility])     Patient Active Problem List   Diagnosis Date Noted  . COPD exacerbation ([place]) 04/21/2016  . Protrusion of lumbar intervertebral disc 04/21/2016  . Smoking addiction 04/21/2016  . Breast lump on left side at 7 o'clock position 06/22/2012  . Hypertension, benign 06/02/2011  . Tobacco abuse 06/02/2011  . Perimenopause 06/02/2011  . Hematuria, microscopic  06/02/2011    Past Surgical History:  Procedure Laterality Date  . ARM WOUND REPAIR / CLOSURE    . BREAST BIOPSY Left 07/03/2012  . COLPOSCOPY    . DILATION AND CURETTAGE OF UTERUS    . TUBAL LIGATION       OB History    Gravida  7   Para  2   Term  1   Preterm  1   AB  5   Living  2     SAB  3   TAB  2   Ectopic      Multiple      Live Births  1            Home Medications    Prior to Admission medications   Medication Sig Start Date End Date Taking? Authorizing Provider  ciprofloxacin (CIPRO) 500 MG tablet Take 1 tablet (500 mg total) by mouth 2 (two) times daily. 03/05/18  Yes Nandigam, Venia Minks, MD  ibuprofen (ADVIL,MOTRIN) 200 MG tablet Take 800 mg by mouth every 6 (six) hours as needed for moderate pain.   Yes [provider]  metroNIDAZOLE (FLAGYL) 500 MG tablet Take 1 tablet (500 mg total) by mouth 2 (two) times daily. 03/05/18  Yes Nandigam, Venia Minks, MD  predniSONE (DELTASONE) 10 MG tablet 40 mg daily x 6 days, 30 mg daily x 7 days, 20 mg daily x 7 days then 10 mg daily x 7  days Patient taking differently: Take 10-40 mg by mouth See admin instructions. Take 40 mg daily x 6 days, 30 mg daily x 7 days, 20 mg daily x 7 days then 10 mg daily x 7 days 02/24/18  Yes Sherwood Gambler, MD    Family History Family History  Problem Relation Age of Onset  . Diabetes Father   . Diabetes Sister   . Hypertension Sister   . Diabetes Brother   . Hypertension Brother   . Cancer Mother   . Other Neg Hx     Social History Social History   Tobacco Use  . Smoking status: Current Every Day Smoker    Packs/day: 1.00    Types: Cigarettes  . Smokeless tobacco: Former Network engineer Use Topics  . Alcohol use: Yes    Comment: Quit Sep 02, 2016  . Drug use: No     Allergies   Hydrocodone-acetaminophen   Review of Systems Review of Systems  Constitutional: Positive for diaphoresis, fatigue and fever.  Gastrointestinal: Positive for abdominal  pain, nausea and vomiting.  All other systems reviewed and are negative.    Physical Exam Updated Vital Signs BP 107/88 (BP Location: Left Arm)   Pulse 98   Temp (!) 100.7 F (38.2 C) (Rectal)   Resp 18   Ht 5' 2"  (1.575 m)   Wt 51.3 kg   LMP 06/20/2012   SpO2 94%   BMI 20.67 kg/m   Physical Exam  Constitutional: She is oriented to person, place, and time. She appears well-developed and well-nourished. No distress.  Non toxic  HENT:  Head: Normocephalic and atraumatic.  Nose: Nose normal.  Moist mucous membranes   Eyes: Pupils are equal, round, and reactive to light. Conjunctivae and EOM are normal.  Neck: Normal range of motion.  Cardiovascular: Normal rate, regular rhythm and intact distal pulses.  2+ DP and radial pulses bilaterally. No LE edema.   Pulmonary/Chest: Effort normal and breath sounds normal.  Abdominal: Soft. Bowel sounds are normal. There is tenderness in the suprapubic area and left lower quadrant. There is guarding.  No rigidity or rebound. No CVA tenderness. Negative Murphy's and McBurney's. Active BS to lower quadrants.   Musculoskeletal: Normal range of motion.  Neurological: She is alert and oriented to person, place, and time.  Skin: Skin is warm and dry. Capillary refill takes less than 2 seconds.  Psychiatric: She has a normal mood and affect. Her behavior is normal.  Nursing note and vitals reviewed.    ED Treatments / Results  Labs (all labs ordered are listed, but only abnormal results are displayed) Labs Reviewed  CBC WITH DIFFERENTIAL/PLATELET - Abnormal; Notable for the following components:      Result Value   WBC 27.1 (*)    RDW 15.8 (*)    All other components within normal limits  COMPREHENSIVE METABOLIC PANEL - Abnormal; Notable for the following components:   Glucose, Bld 100 (*)    Calcium 8.7 (*)    Albumin 3.1 (*)    All other components within normal limits  CULTURE, BLOOD (ROUTINE X 2)  CULTURE, BLOOD (ROUTINE X 2)    LIPASE, BLOOD  URINALYSIS, ROUTINE W REFLEX MICROSCOPIC  I-STAT CG4 LACTIC ACID, ED  I-STAT CG4 LACTIC ACID, ED    EKG EKG Interpretation  Date/Time:  Friday March 09 2018 13:49:19 EDT Ventricular Rate:  104 PR Interval:    QRS Duration: 78 QT Interval:  349 QTC Calculation: 459 R Axis:   89  Text Interpretation:  Sinus tachycardia Consider left ventricular hypertrophy No significant change since last tracing Confirmed by Dorie Rank [phone]) on 03/09/2018 2:00:46 PM   Radiology Ct Abdomen Pelvis W Contrast  Result Date: 03/09/2018 CLINICAL DATA:  52 year old female with history of 46 weeks of abdominal cramping, nausea, vomiting and diarrhea. Lethargy. Possible history of diverticulosis. Possible history of Crohn's disease. EXAM: CT ABDOMEN AND PELVIS WITH CONTRAST TECHNIQUE: Multidetector CT imaging of the abdomen and pelvis was performed using the standard protocol following bolus administration of intravenous contrast. CONTRAST:  152m ISOVUE-300 IOPAMIDOL (ISOVUE-300) INJECTION 61% COMPARISON:  CT the abdomen and pelvis 02/23/2018. FINDINGS: Lower chest: Unremarkable. Hepatobiliary: No cystic or solid hepatic lesions. No intra or extrahepatic biliary ductal dilatation. Gallbladder is normal in appearance. Pancreas: No pancreatic mass. No pancreatic ductal dilatation. No pancreatic or peripancreatic fluid or inflammatory changes. Spleen: Unremarkable. Adrenals/Urinary Tract: Bilateral kidneys and adrenal glands are normal in appearance. There is no hydroureteronephrosis. Urinary bladder is normal in appearance. Stomach/Bowel: Normal appearance of the stomach. No pathologic dilatation of small bowel or colon. When compared to the prior examination, the previously noted mural thickening and luminal narrowing associated with the distal and terminal ileum has resolved. There continues to be some mural thickening and mucosal hyperenhancement with luminal narrowing in the proximal ileum or distal  jejunum, best appreciated on axial image 55 of series 2 and coronal image 40 of series 6. There is also severe thickening of the sigmoid colon. 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 These fistulous tracts and the surrounding inflammation appears more extensive than the prior examination. These tracts are highly irregular in shape and therefore difficult to accurately measure, however, the largest of these currently measures approximately 6.3 x 4.8 x 4.8 cm (axial image 62 of series 2 and coronal image 52 of series 6). In addition, there is a rim enhancing fluid collection in the low left anatomic pelvis (axial image 66 of series 2 and coronal image 46 of series 6) which currently measures 2.2 x 1.8 x 1.7 cm, compatible with a small abscess. Normal appendix. Vascular/Lymphatic: Aortic atherosclerosis, without evidence of aneurysm or dissection in the abdominal or pelvic vasculature. No lymphadenopathy noted in the abdomen or pelvis. Reproductive: Uterus and ovaries are unremarkable in appearance. Other: Trace volume of ascites.  No pneumoperitoneum. Musculoskeletal: No aggressive appearing lytic or blastic lesions are noted in the visualized portions of the skeleton. IMPRESSION: 1. Although there has been resolution of the inflammatory changes involving the distal and terminal ileum, there continues to be extensive areas of mural thickening involving mid to distal small bowel, as well as the sigmoid colon. In the low anatomic pelvis there are worsening areas which appear to reflect enteroenteric and/or enterocolonic fistulae, as well as a developing  abscess in the low left hemipelvis, as detailed above. Surgical consultation is recommended. 2. Aortic atherosclerosis. 3. Additional incidental findings, as above. These results will be called to the ordering clinician or representative by the Radiologist Assistant, and communication documented in the PACS or zVision Dashboard. Electronically Signed   By: DVinnie LangtonM.D.   On: 03/09/2018 11:24    Procedures .Critical Care Performed by: GKinnie Feil PA-C  Authorized by: Kinnie Feil, PA-C   Critical care provider statement:    Critical care time (minutes):  45   Critical care was necessary to treat or prevent imminent or life-threatening deterioration of the following conditions:  Sepsis   Critical care was time spent personally by me on the following activities:  Discussions with consultants, evaluation of patient's response to treatment, examination of patient, ordering and performing treatments and interventions, ordering and review of laboratory studies, ordering and review of radiographic studies, pulse oximetry, re-evaluation of patient's condition, obtaining history from patient or surrogate and review of old charts   I assumed direction of critical care for this patient from another provider in my specialty: no     (including critical care time)  Medications Ordered in ED Medications  acetaminophen (TYLENOL) tablet 650 mg (has no administration in time range)  piperacillin-tazobactam (ZOSYN) IVPB 3.375 g (0 g Intravenous Stopped 03/09/18 1516)  ondansetron (ZOFRAN) injection 4 mg (4 mg Intravenous Given 03/09/18 1338)  sodium chloride 0.9 % bolus 1,000 mL (0 mLs Intravenous Stopped 03/09/18 1516)  morphine 4 MG/ML injection 4 mg (4 mg Intravenous Given 03/09/18 1338)     Initial Impression / Assessment and Plan / ED Course  I have reviewed the triage vital signs and the nursing notes.  Pertinent labs & imaging results that were available during my care of the  patient were reviewed by me and considered in my medical decision making (see chart for details).  Clinical Course as of Mar 09 1526  Fri Mar 09, 2018  1444 WBC(!): 27.1 [CG]  1447 Spoke to Dr Romana Juniper (general surgery) who will add patient to their patient list,if not drainable abscess will need medical management.   [CG]  1521 Spoke to hospitalist who will admit patient; recommending GI consult, pending.   [CG]    Clinical Course User Index [CG] Kinnie Feil, PA-C    CT AP done today reviewed shows extraluminal collections of feculent material between sigmoid and mid/distal small bowel likely from fistulae, more extensive surrounding inflammation, and small abscess 2 x 2 x 2 cm.    1350: SIRS criteria met with tachycardia and tachypnea.  Rectal temp 100.7, HR improving 100-110s.  Will active sepsis code.   1520: Work-up remarkable for leukocytosis 27.1 in setting of prednisone use and fever.  Vital signs have improved.  Patient admitted by hospitalist.  Pending GI consult.  1525: Spoke to [facility] Ellouise Newer). Surgery and GI have communicated and at this time general surgery will follow and consult GI as needed. No indication for GI interventions at this time. Pt may need IR for drainage of abscess per general surgery.    Final Clinical Impressions(s) / ED Diagnoses   Final diagnoses:  Pelvic abscess in female  Sepsis, due to unspecified organism [facility])    ED Discharge Orders    None       Arlean Hopping 03/09/18 1527    Dorie Rank, MD 03/09/18 [phone]

## 2018-03-09 NOTE — Plan of Care (Signed)
Plan of care discussed with patient

## 2018-03-09 NOTE — ED Triage Notes (Signed)
Pt sent from [facility] for CT showing an abscess in her pelvic area. Pt has been on antibiotics for possible Crohn's disease.

## 2018-03-09 NOTE — ED Notes (Signed)
ED Provider at bedside. 

## 2018-03-09 NOTE — Consult Note (Signed)
Chief Complaint: Patient was seen in consultation today for image guided aspiration and drainage of pelvic abscess.  Referring Physician(s): Brigid Re, PA-C  Supervising Physician: Daryll Brod  Patient Status: [facility] - In-pt  History of Present Illness: Christina Miller is a 52 y.o. female with a past medical history of COPD, HTN, MS, GERD and anemia presented to [facility] ED today due to abnormal outpatient CT scan - she is followed by [name] of [facility]. She endorse abdominal pain/cramping, n/v and diarrhea for over 1 month. She was seen at [facility] on 02/15/18 for same; pelvic US performed which showed moderate free peritoneal fluid. She presented to [facility] ED on 8/9 for above symptoms and CT showed small bowel inflammation with mural thickening, inflammation of adjacent mesentery and evidence of enteroenteric/enterocolic fistulas - d/ced from ED on same day with steroid taper. She initially felt better after medications however this morning while drinking PO contrast for outpatient CT she began to have severe abdominal cramps with subjective fever and chills. Outpatient CT done today showed, "extensive areas of mural thickening involving mid to distal small bowel, as well as the sigmoid colon. In the low anatomic pelvis there are worsening areas which appear to reflect enteroenteric and/or enterocolonic fistulae, as well as a developing abscess in the low left hemipelvis."    GI suspicious that she may have Chron's disease. She is now admitted for sepsis evaluation and treatment. Consult was placed to IR for aspiration and drainage of pelvic abscess.  Past Medical History:  Diagnosis Date  . Abnormal Pap smear    cryo  . Anemia   . Bone fracture    ankle  . COPD (chronic obstructive pulmonary disease) ([place])   . Emphysema   . GERD (gastroesophageal reflux disease)   . Headache(784.0)   . Hypertension   . Infection    urinary tract infection  . MS (multiple sclerosis) ([place])      Past Surgical History:  Procedure Laterality Date  . ARM WOUND REPAIR / CLOSURE    . BREAST BIOPSY Left 07/03/2012  . COLPOSCOPY    . DILATION AND CURETTAGE OF UTERUS    . TUBAL LIGATION      Allergies: Hydrocodone-acetaminophen  Medications: Prior to Admission medications   Medication Sig Start Date End Date Taking? Authorizing Provider  ciprofloxacin (CIPRO) 500 MG tablet Take 1 tablet (500 mg total) by mouth 2 (two) times daily. 03/05/18  Yes Nandigam, Venia Minks, MD  ibuprofen (ADVIL,MOTRIN) 200 MG tablet Take 800 mg by mouth every 6 (six) hours as needed for moderate pain.   Yes [provider]  metroNIDAZOLE (FLAGYL) 500 MG tablet Take 1 tablet (500 mg total) by mouth 2 (two) times daily. 03/05/18  Yes Nandigam, Venia Minks, MD  predniSONE (DELTASONE) 10 MG tablet 40 mg daily x 6 days, 30 mg daily x 7 days, 20 mg daily x 7 days then 10 mg daily x 7 days Patient taking differently: Take 10-40 mg by mouth See admin instructions. Take 40 mg daily x 6 days, 30 mg daily x 7 days, 20 mg daily x 7 days then 10 mg daily x 7 days 02/24/18  Yes Sherwood Gambler, MD     Family History  Problem Relation Age of Onset  . Diabetes Father   . Diabetes Sister   . Hypertension Sister   . Diabetes Brother   . Hypertension Brother   . Cancer Mother   . Other Neg Hx     Social  History   Socioeconomic History  . Marital status: Legally Separated    Spouse name: Not on file  . Number of children: Not on file  . Years of education: Not on file  . Highest education level: Not on file  Occupational History  . Not on file  Social Needs  . Financial resource strain: Not on file  . Food insecurity:    Worry: Not on file    Inability: Not on file  . Transportation needs:    Medical: Not on file    Non-medical: Not on file  Tobacco Use  . Smoking status: Current Every Day Smoker    Packs/day: 1.00    Types: Cigarettes  . Smokeless tobacco: Former Network engineer and Sexual  Activity  . Alcohol use: Yes    Comment: Quit Sep 02, 2016  . Drug use: No  . Sexual activity: Yes    Birth control/protection: Surgical  Lifestyle  . Physical activity:    Days per week: Not on file    Minutes per session: Not on file  . Stress: Not on file  Relationships  . Social connections:    Talks on phone: Not on file    Gets together: Not on file    Attends religious service: Not on file    Active member of club or organization: Not on file    Attends meetings of clubs or organizations: Not on file    Relationship status: Not on file  Other Topics Concern  . Not on file  Social History Narrative   ** Merged History Encounter **         Review of Systems: A 12 point ROS discussed and pertinent positives are indicated in the HPI above.  All other systems are negative.  Review of Systems  Constitutional: Positive for chills and fever.  Respiratory: Negative for cough and shortness of breath.   Cardiovascular: Negative for chest pain.  Gastrointestinal: Positive for abdominal pain, diarrhea, nausea and vomiting.  Skin: Negative for rash.  Neurological: Negative for syncope.  Psychiatric/Behavioral: Negative for confusion.    Vital Signs: BP 103/73 (BP Location: Left Arm)   Pulse 90   Temp (!) 100.7 F (38.2 C) (Rectal)   Resp 16   Ht 5' 2"  (1.575 m)   Wt 113 lb (51.3 kg)   LMP 06/20/2012   SpO2 94%   BMI 20.67 kg/m   Physical Exam  Constitutional: She is oriented to person, place, and time. No distress.  HENT:  Head: Normocephalic.  Cardiovascular: Normal rate, regular rhythm and normal heart sounds.  Pulmonary/Chest: Effort normal and breath sounds normal.  Abdominal: Soft. Normal appearance and bowel sounds are normal. There is tenderness in the right lower quadrant and left lower quadrant.  Neurological: She is alert and oriented to person, place, and time.  Skin: Skin is warm and dry.  Psychiatric: She has a normal mood and affect. Her behavior is  normal.  Nursing note and vitals reviewed.    MD Evaluation Airway: WNL Heart: WNL Abdomen: WNL Chest/ Lungs: WNL ASA  Classification: 3 Mallampati/Airway Score: One   Imaging: Ct Abdomen Pelvis W Contrast  Result Date: 03/09/2018 CLINICAL DATA:  52 year old female with history of 46 weeks of abdominal cramping, nausea, vomiting and diarrhea. Lethargy. Possible history of diverticulosis. Possible history of Crohn's disease. EXAM: CT ABDOMEN AND PELVIS WITH CONTRAST TECHNIQUE: Multidetector CT imaging of the abdomen and pelvis was performed using the standard protocol following bolus administration of intravenous contrast.  CONTRAST:  130m ISOVUE-300 IOPAMIDOL (ISOVUE-300) INJECTION 61% COMPARISON:  CT the abdomen and pelvis 02/23/2018. FINDINGS: Lower chest: Unremarkable. Hepatobiliary: No cystic or solid hepatic lesions. No intra or extrahepatic biliary ductal dilatation. Gallbladder is normal in appearance. Pancreas: No pancreatic mass. No pancreatic ductal dilatation. No pancreatic or peripancreatic fluid or inflammatory changes. Spleen: Unremarkable. Adrenals/Urinary Tract: Bilateral kidneys and adrenal glands are normal in appearance. There is no hydroureteronephrosis. Urinary bladder is normal in appearance. Stomach/Bowel: Normal appearance of the stomach. No pathologic dilatation of small bowel or colon. When compared to the prior examination, the previously noted mural thickening and luminal narrowing associated with the distal and terminal ileum has resolved. There continues to be some mural thickening and mucosal hyperenhancement with luminal narrowing in the proximal ileum or distal jejunum, best appreciated on axial image 55 of series 2 and coronal image 40 of series 6. There is also severe thickening of the sigmoid colon. Adjacent to the sigmoid colon and intimately associated with adjacent loops of small bowel (best appreciated on coronal images 42-67 of series 6) there are again  complex extraluminal collections of what appears to be feculent material extending between portions of sigmoid colon and adjacent loops of mid to distal small bowel, likely to reflect the presence of enteroenteric and/or enterocolonic fistulae. These fistulous tracts and the surrounding inflammation appears more extensive than the prior examination. These tracts are highly irregular in shape and therefore difficult to accurately measure, however, the largest of these currently measures approximately 6.3 x 4.8 x 4.8 cm (axial image 62 of series 2 and coronal image 52 of series 6). In addition, there is a rim enhancing fluid collection in the low left anatomic pelvis (axial image 66 of series 2 and coronal image 46 of series 6) which currently measures 2.2 x 1.8 x 1.7 cm, compatible with a small abscess. Normal appendix. Vascular/Lymphatic: Aortic atherosclerosis, without evidence of aneurysm or dissection in the abdominal or pelvic vasculature. No lymphadenopathy noted in the abdomen or pelvis. Reproductive: Uterus and ovaries are unremarkable in appearance. Other: Trace volume of ascites.  No pneumoperitoneum. Musculoskeletal: No aggressive appearing lytic or blastic lesions are noted in the visualized portions of the skeleton. IMPRESSION: 1. Although there has been resolution of the inflammatory changes involving the distal and terminal ileum, there continues to be extensive areas of mural thickening involving mid to distal small bowel, as well as the sigmoid colon. In the low anatomic pelvis there are worsening areas which appear to reflect enteroenteric and/or enterocolonic fistulae, as well as a developing abscess in the low left hemipelvis, as detailed above. Surgical consultation is recommended. 2. Aortic atherosclerosis. 3. Additional incidental findings, as above. These results will be called to the ordering clinician or representative by the Radiologist Assistant, and communication documented in the PACS or  zVision Dashboard. Electronically Signed   By: DVinnie LangtonM.D.   On: 03/09/2018 11:24   Ct Abdomen Pelvis W Contrast  Result Date: 02/23/2018 CLINICAL DATA:  52year old female with history of epigastric pain. EXAM: CT ABDOMEN AND PELVIS WITH CONTRAST TECHNIQUE: Multidetector CT imaging of the abdomen and pelvis was performed using the standard protocol following bolus administration of intravenous contrast. CONTRAST:  1073mOMNIPAQUE IOHEXOL 300 MG/ML  SOLN COMPARISON:  CT the abdomen and pelvis 11/04/2012. FINDINGS: Lower chest: 3 mm pulmonary nodule in the right middle lobe (axial image 1 of series 5), unchanged compared to 2014, considered definitively benign requiring no future imaging follow-up. Hepatobiliary: Low-attenuation/hypoperfusion adjacent to the falciform ligament  most evident in segment 4B, likely to represent a benign perfusion anomaly. No other definite suspicious appearing cystic or solid hepatic lesions. No intra or extrahepatic biliary ductal dilatation. Gallbladder is normal in appearance. Pancreas: No pancreatic mass. No pancreatic ductal dilatation. No pancreatic or peripancreatic fluid or inflammatory changes. Spleen: Unremarkable. Adrenals/Urinary Tract: Mild left hydroureteronephrosis which appears related to extrinsic compression of the distal third of the left ureter (discussed below). Bilateral kidneys and adrenal glands are otherwise normal in appearance. Urinary bladder is normal in appearance. Stomach/Bowel: Normal appearance of the stomach. No pathologic dilatation of small bowel or colon. However, there are areas throughout the mid to distal small bowel with profound mural thickening and extensive surrounding inflammatory changes. Terminal ileum does appear involved with mural thickening and luminal narrowing. The most severe area of mural thickening appears to be in the proximal ileum or distal jejunum, best appreciated in the central aspect of the pelvis where there are  extensive inflammatory changes in the small bowel mesentery, as well as extraluminal tracks which appear to reflect enteroenteric and/or enterocolonic fistulae, which appear to contain fecalized contents. This is best appreciated on coronal images 39-75 of series 6. Normal appendix. Vascular/Lymphatic: Aortic atherosclerosis, without evidence of aneurysm or dissection in the abdominal or pelvic vasculature. No lymphadenopathy noted in the abdomen or pelvis. Reproductive: Uterus and ovaries are unremarkable in appearance. Other: Trace volume of ascites.  No pneumoperitoneum. Musculoskeletal: There are no aggressive appearing lytic or blastic lesions noted in the visualized portions of the skeleton. IMPRESSION: 1. There is a spectrum of findings suggestive of active Crohn's disease, as detailed above. In addition to extensive areas of mural thickening, mucosal hyperenhancement and surrounding inflammatory changes, there appears to be enteroenteric and/or enterocolonic fistulae, as discussed above. 2. Aortic atherosclerosis. 3. Additional incidental findings, as above. Electronically Signed   By: Vinnie Langton M.D.   On: 02/23/2018 22:48   US Pelvic Complete With Transvaginal  Result Date: 02/15/2018 CLINICAL DATA:  Abdominal pain. EXAM: TRANSABDOMINAL AND TRANSVAGINAL ULTRASOUND OF PELVIS TECHNIQUE: Both transabdominal and transvaginal ultrasound examinations of the pelvis were performed. Transabdominal technique was performed for global imaging of the pelvis including uterus, ovaries, adnexal regions, and pelvic cul-de-sac. It was necessary to proceed with endovaginal exam following the transabdominal exam to visualize the ovaries and endometrial stripe and better visualize the uterus. COMPARISON:  None FINDINGS: Uterus Measurements: 5.4 x 4.0 x 3.0 cm. No fibroids or other mass visualized. Endometrium Thickness: 3.2 mm. Small focal calcification. Small amount of fluid in the cervical canal. Right ovary  Measurements: 1.7 x 1.6 x 1.2 cm. Normal appearance/no adnexal mass. Left ovary Measurements: 1.9 x 1.3 x 1.3 cm. Normal appearance/no adnexal mass. Other findings Small to moderate amount of free peritoneal fluid. IMPRESSION: Small to moderate amount of free peritoneal fluid. Otherwise, unremarkable examination. Electronically Signed   By: Claudie Revering M.D.   On: 02/15/2018 15:34    Labs:  CBC: Recent Labs    02/15/18 1402 02/23/18 1646 03/05/18 1446 03/09/18 1315  WBC 12.2* 8.6 19.1* 27.1*  HGB 13.9 12.6 13.2 12.7  HCT 41.0 38.9 40.6 38.7  PLT 350 396 414.0* 379    COAGS: No results for input(s): INR, APTT in the last 8760 hours.  BMP: Recent Labs    02/15/18 1402 02/23/18 1646 03/05/18 1446 03/09/18 1315  NA 132* 136 137 138  K 4.8 3.1* 3.6 3.6  CL 93* 94* 100 99  CO2 24 29 29 27   GLUCOSE 86 96 156*  100*  BUN 19 9 16 8   CALCIUM 8.9 8.7* 9.3 8.7*  CREATININE 0.68 0.59 0.70 0.51  GFRNONAA >60 >60  --  >60  GFRAA >60 >60  --  >60    LIVER FUNCTION TESTS: Recent Labs    02/15/18 1402 02/23/18 1646 03/05/18 1446 03/09/18 1315  BILITOT 0.9 0.7 0.6 0.7  AST 24 16 23 25   ALT 13 10 20 17   ALKPHOS 70 55 62 45  PROT 7.4 7.2 7.1 6.5  ALBUMIN 3.3* 2.9* 3.6 3.1*    TUMOR MARKERS: No results for input(s): AFPTM, CEA, CA199, CHROMGRNA in the last 8760 hours.  Assessment and Plan:  Pelvic abscess noted on outpatient CT abd/pelvis w/contrast today which was ordered by [name] for evaluation of 1+ month of abdominal pain/cramps, N/V, diarrhea and intermittent fevers/chills. Patient discussed with Dr. Annamaria Boots today who is amenable to proceed with drainage of pelvic abscess scheduled for tomorrow with Dr. Anselm Pancoast. Lovenox was d/ced, patient NPO after midnight.   Risks and benefits discussed with the patient including bleeding, infection, damage to adjacent structures, bowel perforation/fistula connection, and sepsis.  All of the patient's questions were answered, patient  is agreeable to proceed.  Consent signed and in chart.  Thank you for this interesting consult.  I greatly enjoyed meeting Christina Miller and look forward to participating in their care.  A copy of this report was sent to the requesting provider on this date.  Electronically Signed: Joaquim Nam, PA-C 03/09/2018, 4:40 PM   I spent a total of 40 Minutes  in face to face in clinical consultation, greater than 50% of which was counseling/coordinating care for pelvic abscess drain.

## 2018-03-09 NOTE — Progress Notes (Signed)
Pharmacy Antibiotic Note  Christina Miller is a 52 y.o. female admitted on 03/09/2018 with Intra-abd infection.  Pharmacy has been consulted for Zosyn dosing.  Plan: Zosyn 3.375g IV q8h (4 hour infusion).  Height: 5' 2"  (157.5 cm) Weight: 113 lb (51.3 kg) IBW/kg (Calculated) : 50.1  Temp (24hrs), Avg:100.2 F (37.9 C), Min:99.3 F (37.4 C), Max:100.7 F (38.2 C)  Recent Labs  Lab 03/05/18 1446 03/09/18 1315 03/09/18 1345  WBC 19.1* 27.1*  --   CREATININE 0.70 0.51  --   LATICACIDVEN  --   --  1.73    Estimated Creatinine Clearance: 64.3 mL/min (by C-G formula based on SCr of 0.51 mg/dL).    Allergies  Allergen Reactions  . Hydrocodone-Acetaminophen Nausea And Vomiting   Antimicrobials this admission: 8/23 Zosyn >>   Microbiology results: 8/23 BCx: sent  Thank you for allowing pharmacy to be a part of this patient's care.  Minda Ditto PharmD Pager [phone] 03/09/2018, 4:29 PM

## 2018-03-10 ENCOUNTER — Inpatient Hospital Stay (HOSPITAL_COMMUNITY): Payer: Self-pay

## 2018-03-10 DIAGNOSIS — K659 Peritonitis, unspecified: Secondary | ICD-10-CM

## 2018-03-10 DIAGNOSIS — R935 Abnormal findings on diagnostic imaging of other abdominal regions, including retroperitoneum: Secondary | ICD-10-CM

## 2018-03-10 DIAGNOSIS — K529 Noninfective gastroenteritis and colitis, unspecified: Secondary | ICD-10-CM

## 2018-03-10 DIAGNOSIS — K632 Fistula of intestine: Secondary | ICD-10-CM

## 2018-03-10 LAB — CBC
HEMATOCRIT: 32.9 % — AB (ref 36.0–46.0)
HEMOGLOBIN: 10.8 g/dL — AB (ref 12.0–15.0)
MCH: 31.8 pg (ref 26.0–34.0)
MCHC: 32.8 g/dL (ref 30.0–36.0)
MCV: 96.8 fL (ref 78.0–100.0)
Platelets: 320 10*3/uL (ref 150–400)
RBC: 3.4 MIL/uL — ABNORMAL LOW (ref 3.87–5.11)
RDW: 16 % — AB (ref 11.5–15.5)
WBC: 13.9 10*3/uL — ABNORMAL HIGH (ref 4.0–10.5)

## 2018-03-10 LAB — BASIC METABOLIC PANEL
ANION GAP: 5 (ref 5–15)
BUN: 6 mg/dL (ref 6–20)
CALCIUM: 8.1 mg/dL — AB (ref 8.9–10.3)
CO2: 28 mmol/L (ref 22–32)
Chloride: 106 mmol/L (ref 98–111)
Creatinine, Ser: 0.45 mg/dL (ref 0.44–1.00)
GFR calc Af Amer: >60 mL/min (ref >60)
GFR calc non Af Amer: >60 mL/min (ref >60)
Glucose, Bld: 132 mg/dL — ABNORMAL HIGH (ref 70–99)
Potassium: 4.5 mmol/L (ref 3.5–5.1)
Sodium: 139 mmol/L (ref 135–145)

## 2018-03-10 LAB — PROTIME-INR
INR: 0.96
Prothrombin Time: 12.7 seconds (ref 11.4–15.2)

## 2018-03-10 LAB — APTT: APTT: 26 s (ref 24–36)

## 2018-03-10 LAB — HIV ANTIBODY (ROUTINE TESTING W REFLEX): HIV Screen 4th Generation wRfx: NONREACTIVE

## 2018-03-10 MED ORDER — MIDAZOLAM HCL 2 MG/2ML IJ SOLN
INTRAMUSCULAR | Status: AC
  Filled 2018-03-10: qty 6

## 2018-03-10 MED ORDER — SODIUM CHLORIDE 0.9% FLUSH
5.0000 mL | Freq: Three times a day (TID) | INTRAVENOUS | Status: DC
  Administered 2018-03-10 – 2018-03-15 (×13): 5 mL

## 2018-03-10 MED ORDER — MIDAZOLAM HCL 2 MG/2ML IJ SOLN
INTRAMUSCULAR | Status: AC | PRN
  Administered 2018-03-10 (×3): 1 mg via INTRAVENOUS

## 2018-03-10 MED ORDER — FAMOTIDINE 20 MG PO TABS
20.0000 mg | ORAL_TABLET | Freq: Two times a day (BID) | ORAL | Status: DC
  Administered 2018-03-10 – 2018-03-15 (×11): 20 mg via ORAL
  Filled 2018-03-10 (×11): qty 1

## 2018-03-10 MED ORDER — FENTANYL CITRATE (PF) 100 MCG/2ML IJ SOLN
INTRAMUSCULAR | Status: AC
  Filled 2018-03-10: qty 4

## 2018-03-10 MED ORDER — FENTANYL CITRATE (PF) 100 MCG/2ML IJ SOLN
INTRAMUSCULAR | Status: AC | PRN
  Administered 2018-03-10 (×2): 50 ug via INTRAVENOUS

## 2018-03-10 MED ORDER — SODIUM CHLORIDE 0.9 % IV SOLN
INTRAVENOUS | Status: DC
  Administered 2018-03-11: 10 mL/h via INTRAVENOUS

## 2018-03-10 NOTE — Consult Note (Signed)
[facility] Consult: 8:59 AM 03/10/2018  LOS: 1 day    Referring Provider: Dr Horris Latino  Primary Care Physician:  Patient, No Pcp Per Primary Gastroenterologist:  [name]     Reason for Consultation:  Management of likely Crohn's enteritis and sigmoid inflammation.     HPI: Christina Miller is a 52 y.o. female.  PMH COPD, emphysema.  MS.  Hypertension.  S/P colposcopy/cryo for abnormal Pap smear.  GERD.  New patient to [name] as of 03/05/2018.  She had had 2 recent ER visits in August with severe abdominal pain, vomiting, diarrhea.  The symptoms had progressed over 4 to 5 weeks.  Back in July she did had severe constipation with bloating and difficulty passing gas.  After that she developed severe diarrhea and vomiting with abdominal cramps.  Pelvic ultrasound at [facility] 8/1 showed small to moderate free peritoneal fluid but was otherwise unremarkable.  Their suggestion was to refer her to [facility] health and wellness and ultimately to be scheduled for colonoscopy second ED visit, to [facility] on 02/23/2018 there was inflammation, mural thickening and mesenteric and adjacent mesenteric inflammation in the small bowel.  Additionally there was entero-enteric and entero-colic.  Discharge from the ED with prescription for Cipro, Flagyl for 7 days and prednisone taper starting at 40 mg daily decreasing dose by 10 mg every week.  Since starting meds diarrhea improved but abdominal cramping, pain continues.  Not eating much other than soft diet because anything other than that leads to severe abdominal pain.  Intermittent nausea without vomiting continues.  Nonbloody, non-melenic stools occurring 1-2 times daily.  It has dropped 5 -10 pounds over the last month.  After the office visit concluded with Dr.  Silverio Decamp, she continued the Cipro, Flagyl for an additional 7 days.  Continued the prednisone taper (current dose 30 mg) but slow the taper, decreasing by 5 mg rather than 10 mg every week.  She obtained labs including CBC, CMP, ESR, CRP, QuantiFERON gold.  She plan to see the patient back in 2 weeks or sooner if there were problems.  Hgb last week 13.2, this morning it is 10.8.  MCV 96. WBCs last week 19.1, 27.1 yesterday, 13.9 today. Electrolytes and renal function not deranged. Glucose 156 last week, 132 this morning. ESR 35.  CRP 1.1. QuantiFERON gold negative Ferritin 299.  Folate and B12 normal Temp to 100.7 max.    CT abdomen pelvis 8/23.  Inflammatory changes in the distal and terminal ileum resolved but persistent extensive mural thickening of the mid to distal small bowel and sigmoid colon.  Worsening appearance to the fistulas along with developing abscess in the left pelvis.  Patient is hungry this morning, she had clear liquids yesterday and is n.p.o. awaiting IR drainage of the pelvic abscess.  She started on Zosyn, her abdominal pain is improving.  She received 4 mg of morphine 1330 yesterday and 650 mg of acetaminophen at 1400, no pain meds since then.  The Geralynn Ochs was ordered but never given and now discontinued.  Started on Solu-Medrol 40  mg every 12 hours.       Past Medical History:  Diagnosis Date  . Abnormal Pap smear    cryo  . Anemia   . Bone fracture    ankle  . COPD (chronic obstructive pulmonary disease) ([place])   . Emphysema   . GERD (gastroesophageal reflux disease)   . Headache(784.0)   . Hypertension   . Infection    urinary tract infection  . MS (multiple sclerosis) ([place])     Past Surgical History:  Procedure Laterality Date  . ARM WOUND REPAIR / CLOSURE    . BREAST BIOPSY Left 07/03/2012  . COLPOSCOPY    . DILATION AND CURETTAGE OF UTERUS    . TUBAL LIGATION      Prior to Admission medications   Medication Sig Start Date End Date Taking?  Authorizing Provider  ciprofloxacin (CIPRO) 500 MG tablet Take 1 tablet (500 mg total) by mouth 2 (two) times daily. 03/05/18  Yes Nandigam, Venia Minks, MD  ibuprofen (ADVIL,MOTRIN) 200 MG tablet Take 800 mg by mouth every 6 (six) hours as needed for moderate pain.   Yes [provider]  metroNIDAZOLE (FLAGYL) 500 MG tablet Take 1 tablet (500 mg total) by mouth 2 (two) times daily. 03/05/18  Yes Nandigam, Venia Minks, MD  predniSONE (DELTASONE) 10 MG tablet 40 mg daily x 6 days, 30 mg daily x 7 days, 20 mg daily x 7 days then 10 mg daily x 7 days Patient taking differently: Take 10-40 mg by mouth See admin instructions. Take 40 mg daily x 6 days, 30 mg daily x 7 days, 20 mg daily x 7 days then 10 mg daily x 7 days 02/24/18  Yes Sherwood Gambler, MD    Scheduled Meds: . methylPREDNISolone (SOLU-MEDROL) injection  40 mg Intravenous Q12H  . nicotine  21 mg Transdermal Daily   Infusions: . sodium chloride 125 mL/hr at 03/10/18 0608  . sodium chloride    . piperacillin-tazobactam (ZOSYN)  IV 12.5 mL/hr at 03/10/18 0534   PRN Meds: acetaminophen **OR** acetaminophen, albuterol, morphine injection, ondansetron **OR** ondansetron (ZOFRAN) IV, oxyCODONE   Allergies as of 03/09/2018 - Review Complete 03/09/2018  Allergen Reaction Noted  . Hydrocodone-acetaminophen Nausea And Vomiting 01/15/2014    Family History  Problem Relation Age of Onset  . Diabetes Father   . Diabetes Sister   . Hypertension Sister   . Diabetes Brother   . Hypertension Brother   . Cancer Mother   . Other Neg Hx     Social History   Socioeconomic History  . Marital status: Legally Separated    Spouse name: Not on file  . Number of children: Not on file  . Years of education: Not on file  . Highest education level: Not on file  Occupational History  . Not on file  Social Needs  . Financial resource strain: Not on file  . Food insecurity:    Worry: Not on file    Inability: Not on file  . Transportation  needs:    Medical: Not on file    Non-medical: Not on file  Tobacco Use  . Smoking status: Current Every Day Smoker    Packs/day: 1.00    Types: Cigarettes  . Smokeless tobacco: Former Network engineer and Sexual Activity  . Alcohol use: Yes    Comment: Quit Sep 02, 2016  . Drug use: No  . Sexual activity: Yes    Birth control/protection: Surgical  Lifestyle  . Physical activity:  Days per week: Not on file    Minutes per session: Not on file  . Stress: Not on file  Relationships  . Social connections:    Talks on phone: Not on file    Gets together: Not on file    Attends religious service: Not on file    Active member of club or organization: Not on file    Attends meetings of clubs or organizations: Not on file    Relationship status: Not on file  . Intimate partner violence:    Fear of current or ex partner: Not on file    Emotionally abused: Not on file    Physically abused: Not on file    Forced sexual activity: Not on file  Other Topics Concern  . Not on file  Social History Narrative   ** Merged History Encounter **        REVIEW OF SYSTEMS: Constitutional: Fatigue with activity that normally does not cause fatigue such as climbing the stairs in her house. ENT:  No nose bleeds Pulm: No new dyspnea on exertion.  No cough. CV:  No palpitations, no LE edema.  No chest pain GU:  No hematuria, no frequency GI:  Per HPI Heme: No unusual or excessive bleeding or bruising. Transfusions: None Neuro:  No headaches, no peripheral tingling or numbness.  No syncope, no seizures. Derm:  No itching, no rash or sores.  Endocrine:  No sweats or chills.  No polyuria or dysuria Immunization: Did not inquire as to recent vaccinations, per epic she had a flu shot in fall 2017. Travel:  None beyond local counties in last few months.    PHYSICAL EXAM: Vital signs in last 24 hours: Vitals:   03/09/18 2137 03/10/18 0524  BP: (!) 134/101 139/88  Pulse: 71 69  Resp: 18 18    Temp: 98.2 F (36.8 C) 98.8 F (37.1 C)  SpO2: 97% 95%   Wt Readings from Last 3 Encounters:  03/09/18 51.3 kg  03/05/18 51.4 kg  02/23/18 53.1 kg    General: Pleasant, slightly chronically ill appearing and slightly anxious, alert WF. Head: No facial asymmetry or swelling.  No signs of head trauma. Eyes: Scleral icterus.  No conjunctival pallor. Ears: Not hard of hearing. Nose: No discharge or congestion. Mouth: Oral mucosa pink, moist, clear.  Tongue midline.  Dental caries, poor dentition. Neck: No JVD, no masses, no thyromegaly. Lungs: Clear bilaterally, overall reduced breath sounds bilaterally.  No labored breathing or cough. Heart: RRR.  No MRG.  S1, S2 present Abdomen: Soft.  Tender focally in the left lower quadrant, not severe.  No guarding or rebound.  Bowel sounds active, slightly reduced.  No tinkling or tympanitic bowel sounds..   Rectal: Deferred Musc/Skeltl: No joint redness, swelling or obvious deformities. Extremities: CCE. Neurologic: Alert.  Oriented x3.  Good historian.  Moves all 4 limbs with full strength.  No tremor. Skin: No rashes, no sores, no telangiectasia.  No significant bruising Nodes: No cervical adenopathy. Psych: Anxious, pleasant, cooperative.  Speech fluid  Intake/Output from previous day: 08/23 0701 - 08/24 0700 In: 3580.5 [P.O.:1380; I.V.:1048.6; IV Piggyback:1151.9] Out: 826 [Urine:826] Intake/Output this shift: Total I/O In: -  Out: 250 [Urine:250]  LAB RESULTS: Recent Labs    03/09/18 1315 03/10/18 0513  WBC 27.1* 13.9*  HGB 12.7 10.8*  HCT 38.7 32.9*  PLT 379 320   BMET Lab Results  Component Value Date   NA 139 03/10/2018   NA 138 03/09/2018  NA 137 03/05/2018   K 4.5 03/10/2018   K 3.6 03/09/2018   K 3.6 03/05/2018   CL 106 03/10/2018   CL 99 03/09/2018   CL 100 03/05/2018   CO2 28 03/10/2018   CO2 27 03/09/2018   CO2 29 03/05/2018   GLUCOSE 132 (H) 03/10/2018   GLUCOSE 100 (H) 03/09/2018   GLUCOSE 156  (H) 03/05/2018   BUN 6 03/10/2018   BUN 8 03/09/2018   BUN 16 03/05/2018   CREATININE 0.45 03/10/2018   CREATININE 0.51 03/09/2018   CREATININE 0.70 03/05/2018   CALCIUM 8.1 (L) 03/10/2018   CALCIUM 8.7 (L) 03/09/2018   CALCIUM 9.3 03/05/2018   LFT Recent Labs    03/09/18 1315  PROT 6.5  ALBUMIN 3.1*  AST 25  ALT 17  ALKPHOS 45  BILITOT 0.7   PT/INR Lab Results  Component Value Date   INR 0.96 03/10/2018   Hepatitis Panel No results for input(s): HEPBSAG, HCVAB, HEPAIGM, HEPBIGM in the last 72 hours. C-Diff No components found for: CDIFF Lipase     Component Value Date/Time   LIPASE 26 03/09/2018 1315    Drugs of Abuse     Component Value Date/Time   LABOPIA NEGATIVE 03/20/2014 0201   LABOPIA NONE DETECTED 06/29/2013 2014   COCAINSCRNUR NEGATIVE 03/20/2014 0201   LABBENZ NEGATIVE 03/20/2014 0201   LABBENZ NONE DETECTED 06/29/2013 2014   AMPHETMU NEGATIVE 03/20/2014 0201   AMPHETMU NONE DETECTED 06/29/2013 2014   THCU POSITIVE 03/20/2014 0201   THCU NONE DETECTED 06/29/2013 2014   LABBARB NEGATIVE 03/20/2014 0201   LABBARB NONE DETECTED 06/29/2013 2014     RADIOLOGY STUDIES: Ct Abdomen Pelvis W Contrast  Result Date: 03/09/2018 CLINICAL DATA:  52 year old female with history of 46 weeks of abdominal cramping, nausea, vomiting and diarrhea. Lethargy. Possible history of diverticulosis. Possible history of Crohn's disease. EXAM: CT ABDOMEN AND PELVIS WITH CONTRAST TECHNIQUE: Multidetector CT imaging of the abdomen and pelvis was performed using the standard protocol following bolus administration of intravenous contrast. CONTRAST:  169m ISOVUE-300 IOPAMIDOL (ISOVUE-300) INJECTION 61% COMPARISON:  CT the abdomen and pelvis 02/23/2018. FINDINGS: Lower chest: Unremarkable. Hepatobiliary: No cystic or solid hepatic lesions. No intra or extrahepatic biliary ductal dilatation. Gallbladder is normal in appearance. Pancreas: No pancreatic mass. No pancreatic ductal  dilatation. No pancreatic or peripancreatic fluid or inflammatory changes. Spleen: Unremarkable. Adrenals/Urinary Tract: Bilateral kidneys and adrenal glands are normal in appearance. There is no hydroureteronephrosis. Urinary bladder is normal in appearance. Stomach/Bowel: Normal appearance of the stomach. No pathologic dilatation of small bowel or colon. When compared to the prior examination, the previously noted mural thickening and luminal narrowing associated with the distal and terminal ileum has resolved. There continues to be some mural thickening and mucosal hyperenhancement with luminal narrowing in the proximal ileum or distal jejunum, best appreciated on axial image 55 of series 2 and coronal image 40 of series 6. There is also severe thickening of the sigmoid colon. 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 These fistulous tracts and the surrounding inflammation appears more extensive than the prior examination. These tracts are highly irregular in shape and therefore difficult to accurately measure, however, the largest of  these currently measures approximately 6.3 x 4.8 x 4.8 cm (axial image 62 of series 2 and coronal image 52 of series 6). In addition, there is a rim enhancing fluid collection in the low left anatomic pelvis (axial image 66 of series 2 and coronal image 46 of series 6) which currently measures 2.2 x 1.8 x 1.7 cm, compatible with a small abscess. Normal appendix. Vascular/Lymphatic: Aortic atherosclerosis, without evidence of aneurysm or dissection in the abdominal or pelvic vasculature. No lymphadenopathy noted in the  abdomen or pelvis. Reproductive: Uterus and ovaries are unremarkable in appearance. Other: Trace volume of ascites.  No pneumoperitoneum. Musculoskeletal: No aggressive appearing lytic or blastic lesions are noted in the visualized portions of the skeleton. IMPRESSION: 1. Although there has been resolution of the inflammatory changes involving the distal and terminal ileum, there continues to be extensive areas of mural thickening involving mid to distal small bowel, as well as the sigmoid colon. In the low anatomic pelvis there are worsening areas which appear to reflect enteroenteric and/or enterocolonic fistulae, as well as a developing abscess in the low left hemipelvis, as detailed above. Surgical consultation is recommended. 2. Aortic atherosclerosis. 3. Additional incidental findings, as above. These results will be called to the ordering clinician or representative by the Radiologist Assistant, and communication documented in the PACS or zVision Dashboard. Electronically Signed   By: Vinnie Langton M.D.   On: 03/09/2018 11:24    ENDOSCOPIC STUDIES: None  IMPRESSION:   *   Enteritis and possible sigmoid colitis.  Likely Crohn's disease but no endoscopic procedures done yet to confirm this. Left pelvic Fistula.   IR planning drainage today.   Day 2 Zosyn and Solumedrol, WBCs improved, pain improved.    *   Normocytic anemia.  Hgb down ~ 3 gm over 3 weeks.    *  Situational anxiety.  Might benefit from sleep aid meds.    PLAN:     *  IR drainage of pelvic abscess today.  After that can resume PO, should we allow full liquids?    *  Blood cultures in progress   Azucena Freed  03/10/2018, 8:59 AM Phone [phone]

## 2018-03-10 NOTE — Progress Notes (Signed)
[facility] Office:  [phone] General Surgery Progress Note   LOS: 1 day  POD -     Chief Complaint: Abdominal pain  Assessment and Plan: 1.  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  MS 3.  Smokes 4.  COPD 5.  DVT prophylaxis - Lovenox   Principal Problem:   Abdominal infection (HCC) Active Problems:   Tobacco abuse   COPD (chronic obstructive pulmonary disease) (HCC)   Sepsis (HCC)  Subjective:  Less tenderness.  She is hungry.  Has financial issues which makes medical care difficult.  Not working.  Her boyfriend is Film/video editor (not here).  Objective:   Vitals:   03/09/18 2137 03/10/18 0524  BP: (!) 134/101 139/88  Pulse: 71 69  Resp: 18 18  Temp: 98.2 F (36.8 C) 98.8 F (37.1 C)  SpO2: 97% 95%     Intake/Output from previous day:  08/23 0701 - 08/24 0700 In: 3580.5 [P.O.:1380; I.V.:1048.6; IV Piggyback:1151.9] Out: 826 [Urine:826]  Intake/Output this shift:  Total I/O In: -  Out: 250 [Urine:250]   Physical Exam:   General: WF who is alert and oriented.    HEENT: Normal. Pupils equal. .   Lungs: Clear   Abdomen: Minimal tenderness.  No mass.   Lab Results:    Recent Labs    03/09/18 1315 03/10/18 0513  WBC 27.1* 13.9*  HGB 12.7 10.8*  HCT 38.7 32.9*  PLT 379 320    BMET   Recent Labs    03/09/18 1315 03/10/18 0513  NA 138 139  K 3.6 4.5  CL 99 106  CO2 27 28  GLUCOSE 100* 132*  BUN 8 6  CREATININE 0.51 0.45  CALCIUM 8.7* 8.1*    PT/INR   Recent Labs    03/10/18 0620  LABPROT 12.7  INR 0.96    ABG  No results for input(s): PHART, HCO3 in the last 72 hours.  Invalid input(s): PCO2, PO2   Studies/Results:  Ct Abdomen Pelvis W Contrast  Result Date: 03/09/2018 CLINICAL DATA:  52 year old female with history of 46 weeks of abdominal cramping, nausea, vomiting and diarrhea. Lethargy. Possible history of  diverticulosis. Possible history of Crohn's disease. EXAM: CT ABDOMEN AND PELVIS WITH CONTRAST TECHNIQUE: Multidetector CT imaging of the abdomen and pelvis was performed using the standard protocol following bolus administration of intravenous contrast. CONTRAST:  119m ISOVUE-300 IOPAMIDOL (ISOVUE-300) INJECTION 61% COMPARISON:  CT the abdomen and pelvis 02/23/2018. FINDINGS: Lower chest: Unremarkable. Hepatobiliary: No cystic or solid hepatic lesions. No intra or extrahepatic biliary ductal dilatation. Gallbladder is normal in appearance. Pancreas: No pancreatic mass. No pancreatic ductal dilatation. No pancreatic or peripancreatic fluid or inflammatory changes. Spleen: Unremarkable. Adrenals/Urinary Tract: Bilateral kidneys and adrenal glands are normal in appearance. There is no hydroureteronephrosis. Urinary bladder is normal in appearance. Stomach/Bowel: Normal appearance of the stomach. No pathologic dilatation of small bowel or colon. When compared to the prior examination, the previously noted mural thickening and luminal narrowing associated with the distal and terminal ileum has resolved. There continues to be some mural thickening and mucosal hyperenhancement with luminal narrowing in the proximal ileum or distal jejunum, best appreciated on axial image 55 of series 2 and coronal image 40 of series 6. There is also severe thickening of the sigmoid colon. Adjacent to the sigmoid colon and intimately associated with adjacent loops of small bowel (best appreciated on coronal images  42-67 of series 6) there are again complex extraluminal collections of what appears to be feculent material extending between portions of sigmoid colon and adjacent loops of mid to distal small bowel, likely to reflect the presence of enteroenteric and/or enterocolonic fistulae. These fistulous tracts and the surrounding inflammation appears more extensive than the prior examination. These tracts are highly irregular in shape  and therefore difficult to accurately measure, however, the largest of these currently measures approximately 6.3 x 4.8 x 4.8 cm (axial image 62 of series 2 and coronal image 52 of series 6). In addition, there is a rim enhancing fluid collection in the low left anatomic pelvis (axial image 66 of series 2 and coronal image 46 of series 6) which currently measures 2.2 x 1.8 x 1.7 cm, compatible with a small abscess. Normal appendix. Vascular/Lymphatic: Aortic atherosclerosis, without evidence of aneurysm or dissection in the abdominal or pelvic vasculature. No lymphadenopathy noted in the abdomen or pelvis. Reproductive: Uterus and ovaries are unremarkable in appearance. Other: Trace volume of ascites.  No pneumoperitoneum. Musculoskeletal: No aggressive appearing lytic or blastic lesions are noted in the visualized portions of the skeleton. IMPRESSION: 1. Although there has been resolution of the inflammatory changes involving the distal and terminal ileum, there continues to be extensive areas of mural thickening involving mid to distal small bowel, as well as the sigmoid colon. In the low anatomic pelvis there are worsening areas which appear to reflect enteroenteric and/or enterocolonic fistulae, as well as a developing abscess in the low left hemipelvis, as detailed above. Surgical consultation is recommended. 2. Aortic atherosclerosis. 3. Additional incidental findings, as above. These results will be called to the ordering clinician or representative by the Radiologist Assistant, and communication documented in the PACS or zVision Dashboard. Electronically Signed   By: Vinnie Langton M.D.   On: 03/09/2018 11:24     Anti-infectives:   Anti-infectives (From admission, onward)   Start     Dose/Rate Route Frequency Ordered Stop   03/09/18 2200  piperacillin-tazobactam (ZOSYN) IVPB 3.375 g     3.375 g 12.5 mL/hr over 240 Minutes Intravenous Every 8 hours 03/09/18 1627     03/09/18 1330   piperacillin-tazobactam (ZOSYN) IVPB 3.375 g     3.375 g 100 mL/hr over 30 Minutes Intravenous  Once 03/09/18 1322 03/09/18 1516      Alphonsa Overall, MD, FACS Pager: [facility] Office: [phone] 03/10/2018

## 2018-03-10 NOTE — Sedation Documentation (Signed)
SBAR called to [employer], Therapist, sports

## 2018-03-10 NOTE — Procedures (Signed)
  Pre-operative Diagnosis: Intra-abdominal abscess       Post-operative Diagnosis: Intra-abdominal abscess with bowel fistula   Indications: Intra-abdominal abscess with inflammation, suspect bowel fistula  Procedure: CT guided drain placement in abdominopelvic abscess  Findings: Abscess contains oral contrast from recent CT.  10 Fr drain placed and aspirated 5 ml of thick bloody purulent fluid  Complications: None     EBL: Minimal  Plan: Send fluid for culture.  Follow output.  Will need follow up CT and drain injection prior to tube removal.

## 2018-03-10 NOTE — Progress Notes (Addendum)
PROGRESS NOTE  Christina Miller PHX:[id number] DOB: 04-Aug-1965 DOA: 03/09/2018 PCP: Patient, No Pcp Per  HPI/Recap of past 24 hours: Christina Miller is a 52 y.o. female with medical history significant for COPD, HTN, GERD, MS presents to the ED with complaints of abdominal pain, nausea, vomiting for the past 3-4 weeks. Pt initially presented to [facility] ED were a CT on 8/9 was suspicious for Chron's disease and showed enteroenteric and enterocolonic fistulae. Pt was given a course of Cipro/ Flagyl and a Prednisone taper for 4 wks. She was referred to GI, of which they recommended continuing another additional 7 days of AB due to non-resolving symptoms. Pt presented to the ED again, and a repeat CT abd/pelvis showed improving inflammation but worsening of the fistulae and possibly a pelvic abscess. GI consulted and patient admitted for further management.  Today, pt reported feeling better, denies any current N/V, fever/chills, SOB, chest pain, worsening abdominal pain.  Assessment/Plan: Principal Problem:   Abdominal infection ([place]) Active Problems:   Tobacco abuse   COPD (chronic obstructive pulmonary disease) (HCC)   Sepsis ([place])  Sepsis likely 2/2 enteritis/possible sigmoid colitis w/pelvic abscess Current suspicion for Crohn's disease, but not confirmed (leukocytosis + fever on presentation) Currently afebrile with resolving leukocytosis (on steroid) BC X 2 pending LA WNL GI on board, appreciate recs Gen surg on board, rec IR drainage of pelvic abscess IR on board for pelvic abscess drainage on 03/10/18 Continue IV Zosyn Continue Solumedrol 40 mg BID Pain and symptomatic management Continue clear liquids, IVF  HTN Stable Not on any home meds  COPD Stable Inhalers prn  MS Stable, not on any meds  Tobacco abuse Advised to quit Nicotine patch ordered       Code Status: Full  Family Communication: None at bedside  Disposition Plan: Once significant improvement and work  up complete    Consultants:  GI  Gen surgery  IR  Procedures:  IR drainage of pelvic abscess   Antimicrobials:  IV Zosyn  DVT prophylaxis:  SCDs   Objective: Vitals:   03/10/18 1117 03/10/18 1135 03/10/18 1140 03/10/18 1146  BP: (!) 140/93  137/81 127/84  Pulse: 73 87 78 73  Resp: 13 (!) 25 20 13   Temp:      TempSrc:      SpO2: 97% 100% 98% 98%  Weight:      Height:        Intake/Output Summary (Last 24 hours) at 03/10/2018 1149 Last data filed at 03/10/2018 0858 Gross per 24 hour  Intake 3580.51 ml  Output 1076 ml  Net 2504.51 ml   Filed Weights   03/09/18 1353  Weight: 51.3 kg    Exam:   General: NAD, appears older than stated age  Cardiovascular: S1, S2 present  Respiratory: CTAB  Abdomen: Soft, mild generalized tenderness, ND, BS present  Musculoskeletal: No pedal edema bilaterally   Skin: Normal   Psychiatry: Normal mood   Data Reviewed: CBC: Recent Labs  Lab 03/05/18 1446 03/09/18 1315 03/10/18 0513  WBC 19.1* 27.1* 13.9*  NEUTROABS 17.3* 23.5*  --   HGB 13.2 12.7 10.8*  HCT 40.6 38.7 32.9*  MCV 96.4 97.2 96.8  PLT 414.0* 379 016   Basic Metabolic Panel: Recent Labs  Lab 03/05/18 1446 03/09/18 1315 03/10/18 0513  NA 137 138 139  K 3.6 3.6 4.5  CL 100 99 106  CO2 29 27 28   GLUCOSE 156* 100* 132*  BUN 16 8 6   CREATININE 0.70 0.51 0.45  CALCIUM  9.3 8.7* 8.1*   GFR: Estimated Creatinine Clearance: 64.3 mL/min (by C-G formula based on SCr of 0.45 mg/dL). Liver Function Tests: Recent Labs  Lab 03/05/18 1446 03/09/18 1315  AST 23 25  ALT 20 17  ALKPHOS 62 45  BILITOT 0.6 0.7  PROT 7.1 6.5  ALBUMIN 3.6 3.1*   Recent Labs  Lab 03/09/18 1315  LIPASE 26   No results for input(s): AMMONIA in the last 168 hours. Coagulation Profile: Recent Labs  Lab 03/10/18 0620  INR 0.96   Cardiac Enzymes: No results for input(s): CKTOTAL, CKMB, CKMBINDEX, TROPONINI in the last 168 hours. BNP (last 3 results) No  results for input(s): PROBNP in the last 8760 hours. HbA1C: No results for input(s): HGBA1C in the last 72 hours. CBG: No results for input(s): GLUCAP in the last 168 hours. Lipid Profile: No results for input(s): CHOL, HDL, LDLCALC, TRIG, CHOLHDL, LDLDIRECT in the last 72 hours. Thyroid Function Tests: No results for input(s): TSH, T4TOTAL, FREET4, T3FREE, THYROIDAB in the last 72 hours. Anemia Panel: No results for input(s): VITAMINB12, FOLATE, FERRITIN, TIBC, IRON, RETICCTPCT in the last 72 hours. Urine analysis:    Component Value Date/Time   COLORURINE YELLOW 03/09/2018 1315   APPEARANCEUR CLEAR 03/09/2018 1315   APPEARANCEUR Hazy 03/20/2014 0201   LABSPEC 1.028 03/09/2018 1315   LABSPEC 1.015 03/20/2014 0201   PHURINE 8.0 03/09/2018 1315   GLUCOSEU NEGATIVE 03/09/2018 1315   GLUCOSEU Negative 03/20/2014 0201   HGBUR NEGATIVE 03/09/2018 1315   BILIRUBINUR NEGATIVE 03/09/2018 1315   BILIRUBINUR Negative 03/20/2014 0201   KETONESUR NEGATIVE 03/09/2018 1315   PROTEINUR NEGATIVE 03/09/2018 1315   UROBILINOGEN 1.0 11/04/2012 1822   NITRITE NEGATIVE 03/09/2018 1315   LEUKOCYTESUR NEGATIVE 03/09/2018 1315   LEUKOCYTESUR Negative 03/20/2014 0201   Sepsis Labs: @LABRCNTIP (procalcitonin:4,lacticidven:4)  ) Recent Results (from the past 240 hour(s))  Blood culture (routine x 2)     Status: None (Preliminary result)   Collection Time: 03/09/18  1:15 PM  Result Value Ref Range Status   Specimen Description   Final    BLOOD RIGHT HAND Performed at [facility], [place] [address] [place], [place] [address]    Special Requests   Final    BOTTLES DRAWN AEROBIC AND ANAEROBIC Blood Culture adequate volume Performed at [place] [address] [place], [place] [address]    Culture   Final    NO GROWTH < 24 HOURS Performed at [place] [address] [place], [place] [address]    Report Status PENDING  Incomplete  Blood culture  (routine x 2)     Status: None (Preliminary result)   Collection Time: 03/09/18  1:46 PM  Result Value Ref Range Status   Specimen Description   Final    BLOOD RIGHT HAND Performed at [place] [address], [place], [place] [address]    Special Requests   Final    BOTTLES DRAWN AEROBIC AND ANAEROBIC Blood Culture adequate volume Performed at [place] [address], [place], [place] [address]    Culture   Final    NO GROWTH < 24 HOURS Performed at [place] [address], [place], [place] [address]    Report Status PENDING  Incomplete      Studies: No results found.  Scheduled Meds: . famotidine  20 mg Oral BID  . fentaNYL      . methylPREDNISolone (SOLU-MEDROL) injection  40 mg Intravenous  Q12H  . midazolam      . nicotine  21 mg Transdermal Daily    Continuous Infusions: . sodium chloride 125 mL/hr at 03/10/18 0608  . sodium chloride    . piperacillin-tazobactam (ZOSYN)  IV 12.5 mL/hr at 03/10/18 0534     LOS: 1 day     Alma Friendly, MD [facility]   If 7PM-7AM, please contact night-coverage [email or web address] Password [facility] 03/10/2018, 11:49 AM

## 2018-03-11 LAB — BASIC METABOLIC PANEL
ANION GAP: 5 (ref 5–15)
BUN: 7 mg/dL (ref 6–20)
CO2: 25 mmol/L (ref 22–32)
Calcium: 8.2 mg/dL — ABNORMAL LOW (ref 8.9–10.3)
Chloride: 107 mmol/L (ref 98–111)
Creatinine, Ser: 0.47 mg/dL (ref 0.44–1.00)
GFR calc Af Amer: >60 mL/min (ref >60)
GLUCOSE: 196 mg/dL — AB (ref 70–99)
Potassium: 4.5 mmol/L (ref 3.5–5.1)
Sodium: 137 mmol/L (ref 135–145)

## 2018-03-11 LAB — GLUCOSE, CAPILLARY
GLUCOSE-CAPILLARY: 109 mg/dL — AB (ref 70–99)
GLUCOSE-CAPILLARY: 157 mg/dL — AB (ref 70–99)

## 2018-03-11 LAB — CBC WITH DIFFERENTIAL/PLATELET
BASOS ABS: 0 10*3/uL (ref 0.0–0.1)
Basophils Relative: 0 %
Eosinophils Absolute: 0 10*3/uL (ref 0.0–0.7)
Eosinophils Relative: 0 %
HEMATOCRIT: 32.3 % — AB (ref 36.0–46.0)
Hemoglobin: 10.2 g/dL — ABNORMAL LOW (ref 12.0–15.0)
LYMPHS PCT: 5 %
Lymphs Abs: 0.7 10*3/uL (ref 0.7–4.0)
MCH: 31.6 pg (ref 26.0–34.0)
MCHC: 31.6 g/dL (ref 30.0–36.0)
MCV: 100 fL (ref 78.0–100.0)
Monocytes Absolute: 0.3 10*3/uL (ref 0.1–1.0)
Monocytes Relative: 2 %
NEUTROS ABS: 11.7 10*3/uL — AB (ref 1.7–7.7)
Neutrophils Relative %: 93 %
Platelets: 282 10*3/uL (ref 150–400)
RBC: 3.23 MIL/uL — AB (ref 3.87–5.11)
RDW: 16.2 % — ABNORMAL HIGH (ref 11.5–15.5)
WBC: 12.6 10*3/uL — AB (ref 4.0–10.5)

## 2018-03-11 MED ORDER — METHYLPREDNISOLONE SODIUM SUCC 40 MG IJ SOLR
20.0000 mg | Freq: Three times a day (TID) | INTRAMUSCULAR | Status: DC
  Administered 2018-03-11 – 2018-03-13 (×7): 20 mg via INTRAVENOUS
  Filled 2018-03-11 (×7): qty 1

## 2018-03-11 MED ORDER — HYDRALAZINE HCL 20 MG/ML IJ SOLN
10.0000 mg | Freq: Four times a day (QID) | INTRAMUSCULAR | Status: DC | PRN
  Administered 2018-03-11 – 2018-03-13 (×2): 10 mg via INTRAVENOUS
  Filled 2018-03-11 (×2): qty 1

## 2018-03-11 MED ORDER — ENOXAPARIN SODIUM 40 MG/0.4ML ~~LOC~~ SOLN
40.0000 mg | SUBCUTANEOUS | Status: DC
  Administered 2018-03-11 – 2018-03-14 (×4): 40 mg via SUBCUTANEOUS
  Filled 2018-03-11 (×4): qty 0.4

## 2018-03-11 NOTE — Progress Notes (Addendum)
Gastroenterology Inpatient Follow-up Note   PATIENT IDENTIFICATION  Christina Miller is a 52 y.o. female with a pmh significant for COPD, HTN, MS, GERD who presented with N/V/D with clinical picture concerning for IBD who was admitted with a abscess s/p IR drainage. Hospital Day: 3  SUBJECTIVE  The patient has done well yesterday into today. She has not had a C. Difficile checked as of yet but is planned. Her leukocytosis continues to improve with the drain in place and antibiotics on board. Preliminarily her gram stain showed GPCs and GVRs The patient denies fevers or chills. Abdominal pain persists but it is mostly at site of drain and she is getting up and moving.   OBJECTIVE  Scheduled Inpatient Medications:  . famotidine  20 mg Oral BID  . methylPREDNISolone (SOLU-MEDROL) injection  20 mg Intravenous Q8H  . nicotine  21 mg Transdermal Daily  . sodium chloride flush  5 mL Intracatheter Q8H   Continuous Inpatient Infusions:  . sodium chloride 100 mL/hr at 03/11/18 0906  . sodium chloride Stopped (03/11/18 0358)  . piperacillin-tazobactam (ZOSYN)  IV 12.5 mL/hr at 03/11/18 0527   PRN Inpatient Medications: acetaminophen **OR** acetaminophen, albuterol, hydrALAZINE, morphine injection, ondansetron **OR** ondansetron (ZOFRAN) IV, oxyCODONE   Physical Examination  Temp:  [98 F (36.7 C)-98.6 F (37 C)] 98.5 F (36.9 C) (08/25 0526) Pulse Rate:  [68-92] 70 (08/25 0526) Resp:  [11-18] 18 (08/25 0526) BP: (118-160)/(71-107) 159/107 (08/25 0526) SpO2:  [85 %-100 %] 95 % (08/25 0526) Temp (24hrs), Avg:98.3 F (36.8 C), Min:98 F (36.7 C), Max:98.6 F (37 C)  Weight: 51.3 kg GEN: NAD, appears stated age, doesn't appear chronically ill PSYCH: Cooperative EYE: Conjunctivae pink, sclerae anicteric ENT: MMM CV: RR without R/Gs  RESP: CTAB posteriorly GI: NABS, soft, TTP in the midepigastrium and LLQ, Perc drain in LLQ, mild distention, with volitional guarding  present MSK/EXT: No LE edema NEURO:  Alert & Oriented x 3, no focal deficits  Review of Data   Laboratory Studies   Recent Labs  Lab 03/11/18 0450  NA 137  K 4.5  CL 107  CO2 25  BUN 7  CREATININE 0.47  GLUCOSE 196*  CALCIUM 8.2*   Recent Labs  Lab 03/09/18 1315  AST 25  ALT 17  ALKPHOS 45    Recent Labs  Lab 03/09/18 1315 03/10/18 0513 03/11/18 0450  WBC 27.1* 13.9* 12.6*  HGB 12.7 10.8* 10.2*  HCT 38.7 32.9* 32.3*  PLT 379 320 282   Recent Labs  Lab 03/10/18 0620  APTT 26  INR 0.96   Imaging Studies  Ct Image Guided Drainage By Percutaneous Catheter  Result Date: 03/10/2018 INDICATION: 52 year old with an abdominopelvic abscess. Concern for bowel fistula on previous imaging. EXAM: CT GUIDED DRAINAGE OF ABDOMINOPELVIC ABSCESS MEDICATIONS: The patient is currently admitted to the hospital and receiving intravenous antibiotics. ANESTHESIA/SEDATION: 4.0 mg IV Versed 100 mcg IV Fentanyl Moderate Sedation Time:  39 minutes The patient was continuously monitored during the procedure by the interventional radiology nurse under my direct supervision. COMPLICATIONS: None immediate. TECHNIQUE: Informed written consent was obtained from the patient after a thorough discussion of the procedural risks, benefits and alternatives. All questions were addressed. A timeout was performed prior to the initiation of the procedure. PROCEDURE: Patient was placed supine on the CT scanner. Images through the lower abdomen and pelvis were obtained. The area of concern in the left pelvic region was identified. The overlying skin was prepped and draped in sterile fashion. Skin  and soft tissues were anesthetized with 1% lidocaine. 25 gauge trocar needle was directed into the abscess with CT guidance. Thick purulent fluid was aspirated from the needle. A stiff Amplatz wire was advanced into the collection. The tract was dilated to accommodate a 10 [place] multipurpose drain. Approximately 5 mL of  thick purulent fluid was removed from the collection. Drain was sutured to skin. FINDINGS: Again noted is an abscess collection in the left lower abdomen and pelvic region. There is now oral contrast within the abscess and there is concern for a fistula connection between small bowel and sigmoid colon on sequence 2, image 28. Thick purulent fluid was removed from the collection. Drain position confirmed within the collection. IMPRESSION: CT-guided placement of a drainage catheter within the abdominopelvic abscess collection. CT images raise concern for fistula connections between small bowel, sigmoid colon and the abscess. Electronically Signed   By: Markus Daft M.D.   On: 03/10/2018 13:11    ASSESSMENT  [name] is a 52 y.o. female with a pmh significant for COPD, HTN, MS, GERD who presented with N/V/D with clinical picture concerning for IBD who was admitted with a abscess s/p IR drainage.  Patient is overall clinically stable and seems to be improving as well.  Her clinical scenario is screaming IBD, but underlying severe diverticular disease could also be diverticular (wouldn't explain the mural thickening in the Ileum/jejunum however on CT though could be location effect).  Suspect Crohn's Disease.  I have initiated her on Steroids in the interim while continuing her antibiotics.  Surgery has seen and is monitoring along without overt surgical intervention planned at this time.  She will need a colonoscopy or attempt at one with biopsies to obtain tissue and see chronicity.  I think it would be reasonable to approach this if she continues to do well on Tuesday but will see how she does overall.   PLAN/RECOMMENDATIONS  Continue Zosyn Discussion about Vancomycin vs monitoring in setting of the GPCs which will likely be Enterococcus but if staph aureus may not be effectively treated with just Zosyn; will await cultures from fluid Awaiting C. Difficile to be obtained Solumedrol transitioned to 20 mg  Q8H Biologic labs are pending Will likely pursue Colonoscopy with biopsies on Tuesday but final decision on Monday by GI Appreciate surgical colleagues following   Procedure Procedures planned and timing of procedure: Colonoscopy - likely Tuesday Lab work ordered: N/A Hold the following anticoagulation and/or antiplatelets for procedure? VTE PPx should be held on Monday evening   Please page/call with questions or concerns.   Justice Britain, MD [facility] Office # [phone]    LOS: 2 days  Irving Copas  03/11/2018, 11:59 AM

## 2018-03-11 NOTE — Plan of Care (Signed)
Pt stable this am. No needs at this time. No changes to pt overall condition. Will continue to monitor.

## 2018-03-11 NOTE — Progress Notes (Addendum)
Referring Physician(s): Dr Nydia Bouton  Supervising Physician: Marybelle Killings  Patient Status:  [facility] - In-pt  Chief Complaint:  8/24 procedure in IR: Post-operative Diagnosis: Intra-abdominal abscess with bowel fistula Indications: Intra-abdominal abscess with inflammation, suspect bowel fistula Procedure: CT guided drain placement in abdominopelvic abscess  Subjective:  Better today; less pain; less pressure abd sore  Allergies: Hydrocodone-acetaminophen  Medications: Prior to Admission medications   Medication Sig Start Date End Date Taking? Authorizing Provider  ciprofloxacin (CIPRO) 500 MG tablet Take 1 tablet (500 mg total) by mouth 2 (two) times daily. 03/05/18  Yes Nandigam, Venia Minks, MD  ibuprofen (ADVIL,MOTRIN) 200 MG tablet Take 800 mg by mouth every 6 (six) hours as needed for moderate pain.   Yes [provider]  metroNIDAZOLE (FLAGYL) 500 MG tablet Take 1 tablet (500 mg total) by mouth 2 (two) times daily. 03/05/18  Yes Nandigam, Venia Minks, MD  predniSONE (DELTASONE) 10 MG tablet 40 mg daily x 6 days, 30 mg daily x 7 days, 20 mg daily x 7 days then 10 mg daily x 7 days Patient taking differently: Take 10-40 mg by mouth See admin instructions. Take 40 mg daily x 6 days, 30 mg daily x 7 days, 20 mg daily x 7 days then 10 mg daily x 7 days 02/24/18  Yes Sherwood Gambler, MD     Vital Signs: BP (!) 159/107 (BP Location: Right Arm)   Pulse 70   Temp 98.5 F (36.9 C) (Oral)   Resp 18   Ht 5' 2"  (1.575 m)   Wt 113 lb (51.3 kg)   LMP 06/20/2012   SpO2 95%   BMI 20.67 kg/m   Physical Exam  Abdominal: Normal appearance and bowel sounds are normal. There is tenderness.  Skin: Skin is warm and dry.  Site is clean and dry Tender to touch OP brown/purulent Flushes easily  25 cc OP yesterday; 10 cc in JP Cx pending  Vitals reviewed.   Imaging: Ct Abdomen Pelvis W Contrast  Result Date: 03/09/2018 CLINICAL DATA:  52 year old female with history of 46  weeks of abdominal cramping, nausea, vomiting and diarrhea. Lethargy. Possible history of diverticulosis. Possible history of Crohn's disease. EXAM: CT ABDOMEN AND PELVIS WITH CONTRAST TECHNIQUE: Multidetector CT imaging of the abdomen and pelvis was performed using the standard protocol following bolus administration of intravenous contrast. CONTRAST:  167m ISOVUE-300 IOPAMIDOL (ISOVUE-300) INJECTION 61% COMPARISON:  CT the abdomen and pelvis 02/23/2018. FINDINGS: Lower chest: Unremarkable. Hepatobiliary: No cystic or solid hepatic lesions. No intra or extrahepatic biliary ductal dilatation. Gallbladder is normal in appearance. Pancreas: No pancreatic mass. No pancreatic ductal dilatation. No pancreatic or peripancreatic fluid or inflammatory changes. Spleen: Unremarkable. Adrenals/Urinary Tract: Bilateral kidneys and adrenal glands are normal in appearance. There is no hydroureteronephrosis. Urinary bladder is normal in appearance. Stomach/Bowel: Normal appearance of the stomach. No pathologic dilatation of small bowel or colon. When compared to the prior examination, the previously noted mural thickening and luminal narrowing associated with the distal and terminal ileum has resolved. There continues to be some mural thickening and mucosal hyperenhancement with luminal narrowing in the proximal ileum or distal jejunum, best appreciated on axial image 55 of series 2 and coronal image 40 of series 6. There is also severe thickening of the sigmoid colon. Adjacent to the sigmoid colon and intimately associated with adjacent loops of small bowel (best appreciated on coronal images 42-67 of series 6) there are again complex extraluminal collections of what appears to be feculent  material extending between portions of sigmoid colon and adjacent loops of mid to distal small bowel, likely to reflect the presence of enteroenteric and/or enterocolonic fistulae. These fistulous tracts and the surrounding inflammation  appears more extensive than the prior examination. These tracts are highly irregular in shape and therefore difficult to accurately measure, however, the largest of these currently measures approximately 6.3 x 4.8 x 4.8 cm (axial image 62 of series 2 and coronal image 52 of series 6). In addition, there is a rim enhancing fluid collection in the low left anatomic pelvis (axial image 66 of series 2 and coronal image 46 of series 6) which currently measures 2.2 x 1.8 x 1.7 cm, compatible with a small abscess. Normal appendix. Vascular/Lymphatic: Aortic atherosclerosis, without evidence of aneurysm or dissection in the abdominal or pelvic vasculature. No lymphadenopathy noted in the abdomen or pelvis. Reproductive: Uterus and ovaries are unremarkable in appearance. Other: Trace volume of ascites.  No pneumoperitoneum. Musculoskeletal: No aggressive appearing lytic or blastic lesions are noted in the visualized portions of the skeleton. IMPRESSION: 1. Although there has been resolution of the inflammatory changes involving the distal and terminal ileum, there continues to be extensive areas of mural thickening involving mid to distal small bowel, as well as the sigmoid colon. In the low anatomic pelvis there are worsening areas which appear to reflect enteroenteric and/or enterocolonic fistulae, as well as a developing abscess in the low left hemipelvis, as detailed above. Surgical consultation is recommended. 2. Aortic atherosclerosis. 3. Additional incidental findings, as above. These results will be called to the ordering clinician or representative by the Radiologist Assistant, and communication documented in the PACS or zVision Dashboard. Electronically Signed   By: Vinnie Langton M.D.   On: 03/09/2018 11:24   Ct Image Guided Drainage By Percutaneous Catheter  Result Date: 03/10/2018 INDICATION: 52 year old with an abdominopelvic abscess. Concern for bowel fistula on previous imaging. EXAM: CT GUIDED DRAINAGE  OF ABDOMINOPELVIC ABSCESS MEDICATIONS: The patient is currently admitted to the hospital and receiving intravenous antibiotics. ANESTHESIA/SEDATION: 4.0 mg IV Versed 100 mcg IV Fentanyl Moderate Sedation Time:  39 minutes The patient was continuously monitored during the procedure by the interventional radiology nurse under my direct supervision. COMPLICATIONS: None immediate. TECHNIQUE: Informed written consent was obtained from the patient after a thorough discussion of the procedural risks, benefits and alternatives. All questions were addressed. A timeout was performed prior to the initiation of the procedure. PROCEDURE: Patient was placed supine on the CT scanner. Images through the lower abdomen and pelvis were obtained. The area of concern in the left pelvic region was identified. The overlying skin was prepped and draped in sterile fashion. Skin and soft tissues were anesthetized with 1% lidocaine. 11 gauge trocar needle was directed into the abscess with CT guidance. Thick purulent fluid was aspirated from the needle. A stiff Amplatz wire was advanced into the collection. The tract was dilated to accommodate a 10 [place] multipurpose drain. Approximately 5 mL of thick purulent fluid was removed from the collection. Drain was sutured to skin. FINDINGS: Again noted is an abscess collection in the left lower abdomen and pelvic region. There is now oral contrast within the abscess and there is concern for a fistula connection between small bowel and sigmoid colon on sequence 2, image 28. Thick purulent fluid was removed from the collection. Drain position confirmed within the collection. IMPRESSION: CT-guided placement of a drainage catheter within the abdominopelvic abscess collection. CT images raise concern for fistula connections between small  bowel, sigmoid colon and the abscess. Electronically Signed   By: Markus Daft M.D.   On: 03/10/2018 13:11    Labs:  CBC: Recent Labs    03/05/18 1446  03/09/18 1315 03/10/18 0513 03/11/18 0450  WBC 19.1* 27.1* 13.9* 12.6*  HGB 13.2 12.7 10.8* 10.2*  HCT 40.6 38.7 32.9* 32.3*  PLT 414.0* 379 320 282    COAGS: Recent Labs    03/10/18 0620  INR 0.96  APTT 26    BMP: Recent Labs    02/23/18 1646 03/05/18 1446 03/09/18 1315 03/10/18 0513 03/11/18 0450  NA 136 137 138 139 137  K 3.1* 3.6 3.6 4.5 4.5  CL 94* 100 99 106 107  CO2 29 29 27 28 25   GLUCOSE 96 156* 100* 132* 196*  BUN 9 16 8 6 7   CALCIUM 8.7* 9.3 8.7* 8.1* 8.2*  CREATININE 0.59 0.70 0.51 0.45 0.47  GFRNONAA >60  --  >60 >60 >60  GFRAA >60  --  >60 >60 >60    LIVER FUNCTION TESTS: Recent Labs    02/15/18 1402 02/23/18 1646 03/05/18 1446 03/09/18 1315  BILITOT 0.9 0.7 0.6 0.7  AST 24 16 23 25   ALT 13 10 20 17   ALKPHOS 70 55 62 45  PROT 7.4 7.2 7.1 6.5  ALBUMIN 3.3* 2.9* 3.6 3.1*    Assessment and Plan:  Pelvic abscess drain in place Will follow   Electronically Signed: Marquise Wicke A, PA-C 03/11/2018, 1:31 PM   I spent a total of 15 Minutes at the the patient's bedside AND on the patient's hospital floor or unit, greater than 50% of which was counseling/coordinating care for pelvic abscess drain

## 2018-03-11 NOTE — Progress Notes (Signed)
PROGRESS NOTE  Christina Miller XMI:[id number] DOB: 1965-09-03 DOA: 03/09/2018 PCP: Patient, No Pcp Per  HPI/Recap of past 24 hours: Christina Miller is a 52 y.o. female with medical history significant for COPD, HTN, GERD, MS presents to the ED with complaints of abdominal pain, nausea, vomiting for the past 3-4 weeks. Pt initially presented to [facility] ED were a CT on 8/9 was suspicious for Chron's disease and showed enteroenteric and enterocolonic fistulae. Pt was given a course of Cipro/ Flagyl and a Prednisone taper for 4 wks. She was referred to GI, of which they recommended continuing another additional 7 days of AB due to non-resolving symptoms. Pt presented to the ED again, and a repeat CT abd/pelvis showed improving inflammation but worsening of the fistulae and possibly a pelvic abscess. GI consulted and patient admitted for further management.  Today, pt reported feeling better overall, reports some mild tenderness around drain site. Denies any current N/V, fever/chills.  Assessment/Plan: Principal Problem:   Abdominal infection ([place]) Active Problems:   Tobacco abuse   COPD (chronic obstructive pulmonary disease) ([facility])   Sepsis ([place])  Sepsis likely 2/2 enteritis/possible sigmoid colitis w/pelvic abscess Current suspicion for Crohn's disease, but not confirmed Leukocytosis + fever on presentation- sepsis Currently afebrile with resolving leukocytosis (on steroid) BC X 2 NGTD LA WNL GI on board: Planning for possible colonoscopy on this admission Gen surg on board, rec IR drainage of pelvic abscess, no surgical plans IR on board: S/p pelvic abscess drainage on 03/10/18 Gram stain of abscess showed rare gram + cocci, rare gram rod, culture pending Continue IV Zosyn for now Solumedrol changed to 20 mg Q8H by GI Pain and symptomatic management Continue FLD, IVF  HTN Uncontrolled, worsened by ?pain, steroids Not on any home meds IV Hydralazine prn for now. If persistent, will start  amlodipine  COPD Stable Inhalers prn  MS Stable, not on any meds  Tobacco abuse Advised to quit Nicotine patch ordered       Code Status: Full  Family Communication: None at bedside  Disposition Plan: Once significant improvement and work up complete    Consultants:  GI  Gen surgery  IR  Procedures:  IR drainage of pelvic abscess on 03/10/18  Antimicrobials:  IV Zosyn  DVT prophylaxis: Lovenox   Objective: Vitals:   03/10/18 2038 03/11/18 0520 03/11/18 0526 03/11/18 1455  BP: (!) 143/101 (!) 160/102 (!) 159/107 (!) 173/105  Pulse: 81 68 70 71  Resp: 17 18 18 16   Temp: 98.6 F (37 C) 98.4 F (36.9 C) 98.5 F (36.9 C) 98.5 F (36.9 C)  TempSrc: Oral Oral Oral Oral  SpO2: 97% 94% 95% 98%  Weight:      Height:        Intake/Output Summary (Last 24 hours) at 03/11/2018 1521 Last data filed at 03/11/2018 1456 Gross per 24 hour  Intake 3636.44 ml  Output 3915 ml  Net -278.56 ml   Filed Weights   03/09/18 1353  Weight: 51.3 kg    Exam:   General: NAD, appears older than stated age  Cardiovascular: S1, S2 present  Respiratory: CTAB  Abdomen: Soft, mild tenderness around JP drain, ND, BS present  Musculoskeletal: No pedal edema bilaterally   Skin: Normal   Psychiatry: Normal mood   Data Reviewed: CBC: Recent Labs  Lab 03/05/18 1446 03/09/18 1315 03/10/18 0513 03/11/18 0450  WBC 19.1* 27.1* 13.9* 12.6*  NEUTROABS 17.3* 23.5*  --  11.7*  HGB 13.2 12.7 10.8* 10.2*  HCT 40.6  38.7 32.9* 32.3*  MCV 96.4 97.2 96.8 100.0  PLT 414.0* 379 320 883   Basic Metabolic Panel: Recent Labs  Lab 03/05/18 1446 03/09/18 1315 03/10/18 0513 03/11/18 0450  NA 137 138 139 137  K 3.6 3.6 4.5 4.5  CL 100 99 106 107  CO2 29 27 28 25   GLUCOSE 156* 100* 132* 196*  BUN 16 8 6 7   CREATININE 0.70 0.51 0.45 0.47  CALCIUM 9.3 8.7* 8.1* 8.2*   GFR: Estimated Creatinine Clearance: 64.3 mL/min (by C-G formula based on SCr of 0.47  mg/dL). Liver Function Tests: Recent Labs  Lab 03/05/18 1446 03/09/18 1315  AST 23 25  ALT 20 17  ALKPHOS 62 45  BILITOT 0.6 0.7  PROT 7.1 6.5  ALBUMIN 3.6 3.1*   Recent Labs  Lab 03/09/18 1315  LIPASE 26   No results for input(s): AMMONIA in the last 168 hours. Coagulation Profile: Recent Labs  Lab 03/10/18 0620  INR 0.96   Cardiac Enzymes: No results for input(s): CKTOTAL, CKMB, CKMBINDEX, TROPONINI in the last 168 hours. BNP (last 3 results) No results for input(s): PROBNP in the last 8760 hours. HbA1C: No results for input(s): HGBA1C in the last 72 hours. CBG: No results for input(s): GLUCAP in the last 168 hours. Lipid Profile: No results for input(s): CHOL, HDL, LDLCALC, TRIG, CHOLHDL, LDLDIRECT in the last 72 hours. Thyroid Function Tests: No results for input(s): TSH, T4TOTAL, FREET4, T3FREE, THYROIDAB in the last 72 hours. Anemia Panel: No results for input(s): VITAMINB12, FOLATE, FERRITIN, TIBC, IRON, RETICCTPCT in the last 72 hours. Urine analysis:    Component Value Date/Time   COLORURINE YELLOW 03/09/2018 1315   APPEARANCEUR CLEAR 03/09/2018 1315   APPEARANCEUR Hazy 03/20/2014 0201   LABSPEC 1.028 03/09/2018 1315   LABSPEC 1.015 03/20/2014 0201   PHURINE 8.0 03/09/2018 1315   GLUCOSEU NEGATIVE 03/09/2018 1315   GLUCOSEU Negative 03/20/2014 0201   HGBUR NEGATIVE 03/09/2018 1315   BILIRUBINUR NEGATIVE 03/09/2018 1315   BILIRUBINUR Negative 03/20/2014 0201   KETONESUR NEGATIVE 03/09/2018 1315   PROTEINUR NEGATIVE 03/09/2018 1315   UROBILINOGEN 1.0 11/04/2012 1822   NITRITE NEGATIVE 03/09/2018 1315   LEUKOCYTESUR NEGATIVE 03/09/2018 1315   LEUKOCYTESUR Negative 03/20/2014 0201   Sepsis Labs: @LABRCNTIP (procalcitonin:4,lacticidven:4)  ) Recent Results (from the past 240 hour(s))  Blood culture (routine x 2)     Status: None (Preliminary result)   Collection Time: 03/09/18  1:15 PM  Result Value Ref Range Status   Specimen Description   Final     BLOOD RIGHT HAND Performed at [facility], [place] [address], [place], [place] [address]    Special Requests   Final    BOTTLES DRAWN AEROBIC AND ANAEROBIC Blood Culture adequate volume Performed at [facility] [address] [place], [place] [address]    Culture   Final    NO GROWTH < 24 HOURS Performed at [facility] [address] [place], [place] [address]    Report Status PENDING  Incomplete  Blood culture (routine x 2)     Status: None (Preliminary result)   Collection Time: 03/09/18  1:46 PM  Result Value Ref Range Status   Specimen Description   Final    BLOOD RIGHT HAND Performed at [facility] [address] [place], [place] [address]    Special Requests   Final    BOTTLES DRAWN AEROBIC AND ANAEROBIC Blood Culture adequate volume Performed at [facility], [address] [place], [place] [address]    Culture   Final    NO GROWTH < 24 HOURS Performed at [facility] [address] [place], [place] [address]    Report Status PENDING  Incomplete  Aerobic/Anaerobic Culture (surgical/deep wound)     Status: None (Preliminary result)   Collection Time: 03/10/18 12:24 PM  Result Value Ref Range Status   Specimen Description   Final    PELVIS ABSCESS Performed at [facility] [address], [place], [place] [address]    Special Requests   Final    NONE Performed at [facility], [place] [address], [place], [place] [address]    Gram Stain   Final    FEW WBC PRESENT,BOTH PMN AND MONONUCLEAR RARE GRAM POSITIVE COCCI IN PAIRS RARE GRAM VARIABLE ROD    Culture   Final    MODERATE GRAM NEGATIVE RODS IDENTIFICATION AND SUSCEPTIBILITIES TO FOLLOW Performed at [facility], [place] [address] [place], [place] [address]    Report Status PENDING  Incomplete      Studies: No results found.  Scheduled Meds: . famotidine  20 mg Oral  BID  . methylPREDNISolone (SOLU-MEDROL) injection  20 mg Intravenous Q8H  . nicotine  21 mg Transdermal Daily  . sodium chloride flush  5 mL Intracatheter Q8H    Continuous Infusions: . sodium chloride 100 mL/hr at 03/11/18 1000  . sodium chloride Stopped (03/11/18 0358)  . piperacillin-tazobactam (ZOSYN)  IV 3.375 g (03/11/18 1412)     LOS: 2 days     Alma Friendly, MD [facility]   If 7PM-7AM, please contact night-coverage [email or web address] Password [email or web address] 03/11/2018, 3:21 PM

## 2018-03-11 NOTE — Progress Notes (Signed)
Assessment & Plan: HD#3 - intra-abdominal abscess, possible inflammatory bowel disease             IR drain placement successful yesterday  IV abx's  WBC improved  Full liquid diet, ambulatory  GI considering timing of colonoscopy, possible IV steroids.  Surgery will continue to follow for now.        Earnstine Regal, MD, [facility]       Office: [phone]   Chief Complaint: Intra-abdominal abscess, abd pain  Subjective: Patient in bed, comfortable.  Tolerating full liquid diet.  No BM's, passing flatus.  Ambulatory.  Objective: Vital signs in last 24 hours: Temp:  [98 F (36.7 C)-98.6 F (37 C)] 98.5 F (36.9 C) (08/25 0526) Pulse Rate:  [68-92] 70 (08/25 0526) Resp:  [11-25] 18 (08/25 0526) BP: (118-160)/(71-107) 159/107 (08/25 0526) SpO2:  [85 %-100 %] 95 % (08/25 0526) Last BM Date: 03/09/18  Intake/Output from previous day: 08/24 0701 - 08/25 0700 In: 3897.9 [P.O.:1200; I.V.:2496.7; IV Piggyback:201.1] Out: 2625 [Urine:2600; Drains:25] Intake/Output this shift: Total I/O In: -  Out: 300 [Urine:300]  Physical Exam: HEENT - sclerae clear, mucous membranes moist Neck - soft Chest - clear bilaterally Cor - RRR Abdomen - soft without distension; minimal tenderness; drain LLQ with thin tan fluid in bulb Ext - no edema, non-tender Neuro - alert & oriented, no focal deficits  Lab Results:  Recent Labs    03/10/18 0513 03/11/18 0450  WBC 13.9* 12.6*  HGB 10.8* 10.2*  HCT 32.9* 32.3*  PLT 320 282   BMET Recent Labs    03/10/18 0513 03/11/18 0450  NA 139 137  K 4.5 4.5  CL 106 107  CO2 28 25  GLUCOSE 132* 196*  BUN 6 7  CREATININE 0.45 0.47  CALCIUM 8.1* 8.2*   PT/INR Recent Labs    03/10/18 0620  LABPROT 12.7  INR 0.96   Comprehensive Metabolic Panel:    Component Value Date/Time   NA 137 03/11/2018 0450   NA 139 03/10/2018 0513   NA 145 03/19/2014 1829   K 4.5 03/11/2018 0450   K 4.5  03/10/2018 0513   K 3.4 (L) 03/19/2014 1829   CL 107 03/11/2018 0450   CL 106 03/10/2018 0513   CL 112 (H) 03/19/2014 1829   CO2 25 03/11/2018 0450   CO2 28 03/10/2018 0513   CO2 27 03/19/2014 1829   BUN 7 03/11/2018 0450   BUN 6 03/10/2018 0513   BUN 8 03/19/2014 1829   CREATININE 0.47 03/11/2018 0450   CREATININE 0.45 03/10/2018 0513   CREATININE 0.70 03/19/2014 1829   CREATININE 0.67 06/02/2011 1604   GLUCOSE 196 (H) 03/11/2018 0450   GLUCOSE 132 (H) 03/10/2018 0513   GLUCOSE 92 03/19/2014 1829   CALCIUM 8.2 (L) 03/11/2018 0450   CALCIUM 8.1 (L) 03/10/2018 0513   CALCIUM 8.4 (L) 03/19/2014 1829   AST 25 03/09/2018 1315   AST 23 03/05/2018 1446   AST 28 03/19/2014 1829   ALT 17 03/09/2018 1315   ALT 20 03/05/2018 1446   ALT 14 03/19/2014 1829   ALKPHOS 45 03/09/2018 1315   ALKPHOS 62 03/05/2018 1446   ALKPHOS 58 03/19/2014 1829   BILITOT 0.7 03/09/2018 1315   BILITOT 0.6 03/05/2018 1446   BILITOT 0.2 03/19/2014 1829   PROT 6.5 03/09/2018 1315   PROT 7.1 03/05/2018 1446   PROT 7.4 03/19/2014 1829   ALBUMIN 3.1 (  L) 03/09/2018 1315   ALBUMIN 3.6 03/05/2018 1446   ALBUMIN 3.5 03/19/2014 1829    Studies/Results: Ct Abdomen Pelvis W Contrast  Result Date: 03/09/2018 CLINICAL DATA:  52 year old female with history of 46 weeks of abdominal cramping, nausea, vomiting and diarrhea. Lethargy. Possible history of diverticulosis. Possible history of Crohn's disease. EXAM: CT ABDOMEN AND PELVIS WITH CONTRAST TECHNIQUE: Multidetector CT imaging of the abdomen and pelvis was performed using the standard protocol following bolus administration of intravenous contrast. CONTRAST:  130m ISOVUE-300 IOPAMIDOL (ISOVUE-300) INJECTION 61% COMPARISON:  CT the abdomen and pelvis 02/23/2018. FINDINGS: Lower chest: Unremarkable. Hepatobiliary: No cystic or solid hepatic lesions. No intra or extrahepatic biliary ductal dilatation. Gallbladder is normal in appearance. Pancreas: No pancreatic mass. No  pancreatic ductal dilatation. No pancreatic or peripancreatic fluid or inflammatory changes. Spleen: Unremarkable. Adrenals/Urinary Tract: Bilateral kidneys and adrenal glands are normal in appearance. There is no hydroureteronephrosis. Urinary bladder is normal in appearance. Stomach/Bowel: Normal appearance of the stomach. No pathologic dilatation of small bowel or colon. When compared to the prior examination, the previously noted mural thickening and luminal narrowing associated with the distal and terminal ileum has resolved. There continues to be some mural thickening and mucosal hyperenhancement with luminal narrowing in the proximal ileum or distal jejunum, best appreciated on axial image 55 of series 2 and coronal image 40 of series 6. There is also severe thickening of the sigmoid colon. 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 These fistulous tracts and the surrounding inflammation appears more extensive than the prior examination. These tracts are highly irregular in shape and therefore difficult to accurately measure, however, the largest of these currently measures approximately 6.3 x 4.8 x 4.8 cm (axial image 62 of series 2 and coronal image 52 of series 6). In addition, there is a rim enhancing fluid collection in the low left anatomic pelvis (axial image 66 of series 2 and coronal image 46 of series 6) which currently measures 2.2 x 1.8 x 1.7 cm, compatible with a small abscess. Normal appendix. Vascular/Lymphatic: Aortic atherosclerosis, without evidence of aneurysm or dissection in the abdominal or pelvic vasculature. No lymphadenopathy  noted in the abdomen or pelvis. Reproductive: Uterus and ovaries are unremarkable in appearance. Other: Trace volume of ascites.  No pneumoperitoneum. Musculoskeletal: No aggressive appearing lytic or blastic lesions are noted in the visualized portions of the skeleton. IMPRESSION: 1. Although there has been resolution of the inflammatory changes involving the distal and terminal ileum, there continues to be extensive areas of mural thickening involving mid to distal small bowel, as well as the sigmoid colon. In the low anatomic pelvis there are worsening areas which appear to reflect enteroenteric and/or enterocolonic fistulae, as well as a developing abscess in the low left hemipelvis, as detailed above. Surgical consultation is recommended. 2. Aortic atherosclerosis. 3. Additional incidental findings, as above. These results will be called to the ordering clinician or representative by the Radiologist Assistant, and communication documented in the PACS or zVision Dashboard. Electronically Signed   By: DVinnie LangtonM.D.   On: 03/09/2018 11:24   Ct Image Guided Drainage By Percutaneous Catheter  Result Date: 03/10/2018 INDICATION: 52year old with an abdominopelvic abscess. Concern  for bowel fistula on previous imaging. EXAM: CT GUIDED DRAINAGE OF ABDOMINOPELVIC ABSCESS MEDICATIONS: The patient is currently admitted to the hospital and receiving intravenous antibiotics. ANESTHESIA/SEDATION: 4.0 mg IV Versed 100 mcg IV Fentanyl Moderate Sedation Time:  39 minutes The patient was continuously monitored during the procedure by the interventional radiology nurse under my direct supervision. COMPLICATIONS: None immediate. TECHNIQUE: Informed written consent was obtained from the patient after a thorough discussion of the procedural risks, benefits and alternatives. All questions were addressed. A timeout was performed prior to the initiation of the procedure. PROCEDURE: Patient was placed supine on the CT  scanner. Images through the lower abdomen and pelvis were obtained. The area of concern in the left pelvic region was identified. The overlying skin was prepped and draped in sterile fashion. Skin and soft tissues were anesthetized with 1% lidocaine. 48 gauge trocar needle was directed into the abscess with CT guidance. Thick purulent fluid was aspirated from the needle. A stiff Amplatz wire was advanced into the collection. The tract was dilated to accommodate a 10 [place] multipurpose drain. Approximately 5 mL of thick purulent fluid was removed from the collection. Drain was sutured to skin. FINDINGS: Again noted is an abscess collection in the left lower abdomen and pelvic region. There is now oral contrast within the abscess and there is concern for a fistula connection between small bowel and sigmoid colon on sequence 2, image 28. Thick purulent fluid was removed from the collection. Drain position confirmed within the collection. IMPRESSION: CT-guided placement of a drainage catheter within the abdominopelvic abscess collection. CT images raise concern for fistula connections between small bowel, sigmoid colon and the abscess. Electronically Signed   By: Markus Daft M.D.   On: 03/10/2018 13:11      Sanaiyah Kirchhoff M 03/11/2018  Patient ID: Marzella Schlein, female   DOB: 30-Jun-1966, 52 y.o.   MRN: [id number]

## 2018-03-12 ENCOUNTER — Encounter: Payer: Self-pay | Admitting: Gastroenterology

## 2018-03-12 DIAGNOSIS — K632 Fistula of intestine: Secondary | ICD-10-CM

## 2018-03-12 LAB — GLUCOSE, CAPILLARY
GLUCOSE-CAPILLARY: 111 mg/dL — AB (ref 70–99)
Glucose-Capillary: 111 mg/dL — ABNORMAL HIGH (ref 70–99)
Glucose-Capillary: 90 mg/dL (ref 70–99)
Glucose-Capillary: 101 mg/dL — ABNORMAL HIGH (ref 70–99)

## 2018-03-12 LAB — BASIC METABOLIC PANEL
Anion gap: 7 (ref 5–15)
BUN: <5 mg/dL — ABNORMAL LOW (ref 6–20)
CALCIUM: 8.5 mg/dL — AB (ref 8.9–10.3)
CO2: 27 mmol/L (ref 22–32)
Chloride: 105 mmol/L (ref 98–111)
Creatinine, Ser: 0.49 mg/dL (ref 0.44–1.00)
GFR calc Af Amer: >60 mL/min (ref >60)
GFR calc non Af Amer: >60 mL/min (ref >60)
Glucose, Bld: 126 mg/dL — ABNORMAL HIGH (ref 70–99)
Potassium: 4.5 mmol/L (ref 3.5–5.1)
Sodium: 139 mmol/L (ref 135–145)

## 2018-03-12 LAB — CBC WITH DIFFERENTIAL/PLATELET
BASOS ABS: 0 10*3/uL (ref 0.0–0.1)
BASOS PCT: 0 %
Eosinophils Absolute: 0 10*3/uL (ref 0.0–0.7)
Eosinophils Relative: 0 %
HEMATOCRIT: 32.6 % — AB (ref 36.0–46.0)
HEMOGLOBIN: 10.7 g/dL — AB (ref 12.0–15.0)
Lymphocytes Relative: 9 %
Lymphs Abs: 0.9 10*3/uL (ref 0.7–4.0)
MCH: 31.5 pg (ref 26.0–34.0)
MCHC: 32.8 g/dL (ref 30.0–36.0)
MCV: 95.9 fL (ref 78.0–100.0)
MONO ABS: 0.4 10*3/uL (ref 0.1–1.0)
Monocytes Relative: 4 %
NEUTROS ABS: 8.6 10*3/uL — AB (ref 1.7–7.7)
NEUTROS PCT: 87 %
Platelets: 277 10*3/uL (ref 150–400)
RBC: 3.4 MIL/uL — ABNORMAL LOW (ref 3.87–5.11)
RDW: 16.1 % — AB (ref 11.5–15.5)
WBC: 9.9 10*3/uL (ref 4.0–10.5)

## 2018-03-12 LAB — HCV COMMENT:

## 2018-03-12 LAB — HEPATITIS B SURFACE ANTIBODY, QUANTITATIVE

## 2018-03-12 LAB — HEPATITIS B CORE ANTIBODY, TOTAL: Hep B Core Total Ab: NEGATIVE

## 2018-03-12 LAB — HEPATITIS C ANTIBODY (REFLEX)

## 2018-03-12 LAB — VITAMIN D 25 HYDROXY (VIT D DEFICIENCY, FRACTURES): VIT D 25 HYDROXY: 10.7 ng/mL — AB (ref 30.0–100.0)

## 2018-03-12 LAB — HEPATITIS B SURFACE ANTIGEN: HEP B S AG: NEGATIVE

## 2018-03-12 MED ORDER — SODIUM CHLORIDE 0.9 % IV SOLN
2.0000 g | INTRAVENOUS | Status: DC
  Administered 2018-03-12 – 2018-03-14 (×3): 2 g via INTRAVENOUS
  Filled 2018-03-12: qty 2
  Filled 2018-03-12: qty 20
  Filled 2018-03-12 (×2): qty 2

## 2018-03-12 MED ORDER — AMLODIPINE BESYLATE 5 MG PO TABS
5.0000 mg | ORAL_TABLET | Freq: Every day | ORAL | Status: DC
Start: 2018-03-12 — End: 2018-03-15
  Administered 2018-03-12 – 2018-03-15 (×4): 5 mg via ORAL
  Filled 2018-03-12 (×4): qty 1

## 2018-03-12 NOTE — Progress Notes (Signed)
Referring Physician(s): Brigid Re, PA-C  Supervising Physician: Sandi Mariscal  Patient Status:  [facility] - In-pt  Chief Complaint: Follow up pelvic abscess drain placed 8/24 by Dr. Anselm Pancoast  Subjective:  Christina Miller is a 52 y.o. female with a past medical history of COPD, HTN, MS, GERD and anemia presented to [facility] ED on 8/23 due to abnormal outpatient CT scan - she is followed by [name] of [facility]. She endorsed abdominal pain/cramping, n/v and diarrhea for over 1 month. She was seen at [facility] on 02/15/18 for same; pelvic US performed which showed moderate free peritoneal fluid. She presented to [facility] ED on 8/9 for above symptoms and CT showed small bowel inflammation with mural thickening, inflammation of adjacent mesentery and evidence of enteroenteric/enterocolic fistulas - d/ced from ED on same day with steroid taper. She initially felt better after medications however on the morning of 8/23, while drinking PO contrast for outpatient CT, she began to have severe abdominal cramps with subjective fever and chills. Outpatient CT done on 8/23 showed, "extensive areas of mural thickening involving mid to distal small bowel, as well as the sigmoid colon. In the low anatomic pelvis there are worsening areas which appear to reflect enteroenteric and/or enterocolonic fistulae, as well as a developing abscess in the low left hemipelvis."   She was admitted for sepsis evaluation and treatment. IR was consulted for pelvic abscess drain which was performed without complication ob 9/48 by Dr. Anselm Pancoast. GI was consulted who recommends colonoscopy likely tomorrow to further evaluate colitis. IV abx continue, C.Diff has been ordered but not obtained yet, she is on solumedrol 20 mg Q8H.  Patient reports she feels much better, she had a large bowel movement earlier this morning without blood or mucous. She states her abdomen hurts only when lying flat and this is minimally painful. She has been up and  walking around without issue.   Allergies: Hydrocodone-acetaminophen  Medications: Prior to Admission medications   Medication Sig Start Date End Date Taking? Authorizing Provider  ciprofloxacin (CIPRO) 500 MG tablet Take 1 tablet (500 mg total) by mouth 2 (two) times daily. 03/05/18  Yes Nandigam, Venia Minks, MD  ibuprofen (ADVIL,MOTRIN) 200 MG tablet Take 800 mg by mouth every 6 (six) hours as needed for moderate pain.   Yes [provider]  metroNIDAZOLE (FLAGYL) 500 MG tablet Take 1 tablet (500 mg total) by mouth 2 (two) times daily. 03/05/18  Yes Nandigam, Venia Minks, MD  predniSONE (DELTASONE) 10 MG tablet 40 mg daily x 6 days, 30 mg daily x 7 days, 20 mg daily x 7 days then 10 mg daily x 7 days Patient taking differently: Take 10-40 mg by mouth See admin instructions. Take 40 mg daily x 6 days, 30 mg daily x 7 days, 20 mg daily x 7 days then 10 mg daily x 7 days 02/24/18  Yes Sherwood Gambler, MD     Vital Signs: BP (!) 160/95 (BP Location: Left Arm)   Pulse 70   Temp 98.2 F (36.8 C) (Oral)   Resp 18   Ht 5' 2"  (1.575 m)   Wt 113 lb (51.3 kg)   LMP 06/20/2012   SpO2 99%   BMI 20.67 kg/m   Physical Exam  Constitutional: She is oriented to person, place, and time. No distress.  HENT:  Head: Normocephalic.  Cardiovascular: Normal rate, regular rhythm and normal heart sounds.  Pulmonary/Chest: Effort normal and breath sounds normal.  Abdominal: Soft. Bowel sounds are normal.  She exhibits no distension. There is tenderness (mild at insertion site abdominal drain).  JP bulb with ~20 cc brown drainage. Insertion site unremarkable.   Neurological: She is alert and oriented to person, place, and time.  Skin: Skin is warm and dry. She is not diaphoretic.  Psychiatric: She has a normal mood and affect. Her behavior is normal. Judgment and thought content normal.  Nursing note and vitals reviewed.   Imaging: Ct Abdomen Pelvis W Contrast  Result Date: 03/09/2018 CLINICAL  DATA:  52 year old female with history of 46 weeks of abdominal cramping, nausea, vomiting and diarrhea. Lethargy. Possible history of diverticulosis. Possible history of Crohn's disease. EXAM: CT ABDOMEN AND PELVIS WITH CONTRAST TECHNIQUE: Multidetector CT imaging of the abdomen and pelvis was performed using the standard protocol following bolus administration of intravenous contrast. CONTRAST:  175m ISOVUE-300 IOPAMIDOL (ISOVUE-300) INJECTION 61% COMPARISON:  CT the abdomen and pelvis 02/23/2018. FINDINGS: Lower chest: Unremarkable. Hepatobiliary: No cystic or solid hepatic lesions. No intra or extrahepatic biliary ductal dilatation. Gallbladder is normal in appearance. Pancreas: No pancreatic mass. No pancreatic ductal dilatation. No pancreatic or peripancreatic fluid or inflammatory changes. Spleen: Unremarkable. Adrenals/Urinary Tract: Bilateral kidneys and adrenal glands are normal in appearance. There is no hydroureteronephrosis. Urinary bladder is normal in appearance. Stomach/Bowel: Normal appearance of the stomach. No pathologic dilatation of small bowel or colon. When compared to the prior examination, the previously noted mural thickening and luminal narrowing associated with the distal and terminal ileum has resolved. There continues to be some mural thickening and mucosal hyperenhancement with luminal narrowing in the proximal ileum or distal jejunum, best appreciated on axial image 55 of series 2 and coronal image 40 of series 6. There is also severe thickening of the sigmoid colon. 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 These fistulous  tracts and the surrounding inflammation appears more extensive than the prior examination. These tracts are highly irregular in shape and therefore difficult to accurately measure, however, the largest of these currently measures approximately 6.3 x 4.8 x 4.8 cm (axial image 62 of series 2 and coronal image 52 of series 6). In addition, there is a rim enhancing fluid collection in the low left anatomic pelvis (axial image 66 of series 2 and coronal image 46 of series 6) which currently measures 2.2 x 1.8 x 1.7 cm, compatible with a small abscess. Normal appendix. Vascular/Lymphatic: Aortic atherosclerosis, without evidence of aneurysm or dissection in the abdominal or pelvic vasculature. No lymphadenopathy noted in the abdomen or pelvis. Reproductive: Uterus and ovaries are unremarkable in appearance. Other: Trace volume of ascites.  No pneumoperitoneum. Musculoskeletal: No aggressive appearing lytic or blastic lesions are noted in the visualized portions of the skeleton. IMPRESSION: 1. Although there has been resolution of the inflammatory changes involving the distal and terminal ileum, there continues to be extensive areas of mural thickening involving mid to distal small bowel, as well as the sigmoid colon. In the low anatomic pelvis there are worsening areas which appear to reflect enteroenteric and/or enterocolonic fistulae, as well as a developing abscess in the low left hemipelvis, as detailed above. Surgical consultation is recommended. 2. Aortic atherosclerosis. 3. Additional incidental findings, as above. These results  will be called to the ordering clinician or representative by the Radiologist Assistant, and communication documented in the PACS or zVision Dashboard. Electronically Signed   By: Vinnie Langton M.D.   On: 03/09/2018 11:24   Ct Image Guided Drainage By Percutaneous Catheter  Result Date: 03/10/2018 INDICATION: 52 year old with an abdominopelvic abscess. Concern for bowel fistula on  previous imaging. EXAM: CT GUIDED DRAINAGE OF ABDOMINOPELVIC ABSCESS MEDICATIONS: The patient is currently admitted to the hospital and receiving intravenous antibiotics. ANESTHESIA/SEDATION: 4.0 mg IV Versed 100 mcg IV Fentanyl Moderate Sedation Time:  39 minutes The patient was continuously monitored during the procedure by the interventional radiology nurse under my direct supervision. COMPLICATIONS: None immediate. TECHNIQUE: Informed written consent was obtained from the patient after a thorough discussion of the procedural risks, benefits and alternatives. All questions were addressed. A timeout was performed prior to the initiation of the procedure. PROCEDURE: Patient was placed supine on the CT scanner. Images through the lower abdomen and pelvis were obtained. The area of concern in the left pelvic region was identified. The overlying skin was prepped and draped in sterile fashion. Skin and soft tissues were anesthetized with 1% lidocaine. 61 gauge trocar needle was directed into the abscess with CT guidance. Thick purulent fluid was aspirated from the needle. A stiff Amplatz wire was advanced into the collection. The tract was dilated to accommodate a 10 [place] multipurpose drain. Approximately 5 mL of thick purulent fluid was removed from the collection. Drain was sutured to skin. FINDINGS: Again noted is an abscess collection in the left lower abdomen and pelvic region. There is now oral contrast within the abscess and there is concern for a fistula connection between small bowel and sigmoid colon on sequence 2, image 28. Thick purulent fluid was removed from the collection. Drain position confirmed within the collection. IMPRESSION: CT-guided placement of a drainage catheter within the abdominopelvic abscess collection. CT images raise concern for fistula connections between small bowel, sigmoid colon and the abscess. Electronically Signed   By: Markus Daft M.D.   On: 03/10/2018 13:11     Labs:  CBC: Recent Labs    03/09/18 1315 03/10/18 0513 03/11/18 0450 03/12/18 0417  WBC 27.1* 13.9* 12.6* 9.9  HGB 12.7 10.8* 10.2* 10.7*  HCT 38.7 32.9* 32.3* 32.6*  PLT 379 320 282 277    COAGS: Recent Labs    03/10/18 0620  INR 0.96  APTT 26    BMP: Recent Labs    03/09/18 1315 03/10/18 0513 03/11/18 0450 03/12/18 0417  NA 138 139 137 139  K 3.6 4.5 4.5 4.5  CL 99 106 107 105  CO2 27 28 25 27   GLUCOSE 100* 132* 196* 126*  BUN 8 6 7  <5*  CALCIUM 8.7* 8.1* 8.2* 8.5*  CREATININE 0.51 0.45 0.47 0.49  GFRNONAA >60 >60 >60 >60  GFRAA >60 >60 >60 >60    LIVER FUNCTION TESTS: Recent Labs    02/15/18 1402 02/23/18 1646 03/05/18 1446 03/09/18 1315  BILITOT 0.9 0.7 0.6 0.7  AST 24 16 23 25   ALT 13 10 20 17   ALKPHOS 70 55 62 45  PROT 7.4 7.2 7.1 6.5  ALBUMIN 3.3* 2.9* 3.6 3.1*    Assessment and Plan:  Pelvic abscess with aspiration and drainage performed in IR by Dr. Anselm Pancoast - patient reports great improvement in her symptoms. WBC normalized (9.9 today, 27.1 on admission), she remains afebrile, JP with 10 cc output total yesterday. GI plans for possible colonoscopy tomorrow. IR to  continue to follow.   Electronically Signed: Joaquim Nam, PA-C 03/12/2018, 10:13 AM   I spent a total of 15 Minutes at the the patient's bedside AND on the patient's hospital floor or unit, greater than 50% of which was counseling/coordinating care for pelvic abscess drain.

## 2018-03-12 NOTE — Progress Notes (Addendum)
Patient ID: Christina Miller, female   DOB: 31-Aug-1965, 52 y.o.   MRN: [id number]    Progress Note   Subjective   Hospital day # 4  IR drain placed 8/25-few cc of brown purulent material   On  Zosyn  On Solumedrol 20  q 8 hrs   She is feeling better , up walking in room , eating full liquids, no fever  No diarrhea - one soft stool this am   Asking to  Go outside to smoke    Objective   Vital signs in last 24 hours: Temp:  [98.2 F (36.8 C)-98.6 F (37 C)] 98.2 F (36.8 C) (08/26 0611) Pulse Rate:  [70-83] 70 (08/26 0611) Resp:  [16-18] 18 (08/26 0611) BP: (152-173)/(94-105) 160/95 (08/26 0611) SpO2:  [97 %-99 %] 99 % (08/26 0611) Last BM Date: 03/09/18 General:    white female in NAD Heart:  Regular rate and rhythm; no murmurs Lungs: Respirations even and unlabored, lungs CTA bilaterally Abdomen:  Soft, tender LMQ/LLQ,and nondistended. Normal bowel sounds. Drain in LLQ Extremities:  Without edema. Neurologic:  Alert and oriented,  grossly normal neurologically. Psych:  Cooperative. Normal mood and affect.  Intake/Output from previous day: 08/25 0701 - 08/26 0700 In: 2588.3 [P.O.:720; I.V.:1730.7; IV Piggyback:137.6] Out: 4310 [Urine:4300; Drains:10] Intake/Output this shift: Total I/O In: 480 [P.O.:480] Out: 300 [Urine:300]  Lab Results: Recent Labs    03/10/18 0513 03/11/18 0450 03/12/18 0417  WBC 13.9* 12.6* 9.9  HGB 10.8* 10.2* 10.7*  HCT 32.9* 32.3* 32.6*  PLT 320 282 277   BMET Recent Labs    03/10/18 0513 03/11/18 0450 03/12/18 0417  NA 139 137 139  K 4.5 4.5 4.5  CL 106 107 105  CO2 28 25 27   GLUCOSE 132* 196* 126*  BUN 6 7 <5*  CREATININE 0.45 0.47 0.49  CALCIUM 8.1* 8.2* 8.5*   LFT Recent Labs    03/09/18 1315  PROT 6.5  ALBUMIN 3.1*  AST 25  ALT 17  ALKPHOS 45  BILITOT 0.7   PT/INR Recent Labs    03/10/18 0620  LABPROT 12.7  INR 0.96    Studies/Results: Ct Image Guided Drainage By Percutaneous Catheter  Result Date:  03/10/2018 INDICATION: 52 year old with an abdominopelvic abscess. Concern for bowel fistula on previous imaging. EXAM: CT GUIDED DRAINAGE OF ABDOMINOPELVIC ABSCESS MEDICATIONS: The patient is currently admitted to the hospital and receiving intravenous antibiotics. ANESTHESIA/SEDATION: 4.0 mg IV Versed 100 mcg IV Fentanyl Moderate Sedation Time:  39 minutes The patient was continuously monitored during the procedure by the interventional radiology nurse under my direct supervision. COMPLICATIONS: None immediate. TECHNIQUE: Informed written consent was obtained from the patient after a thorough discussion of the procedural risks, benefits and alternatives. All questions were addressed. A timeout was performed prior to the initiation of the procedure. PROCEDURE: Patient was placed supine on the CT scanner. Images through the lower abdomen and pelvis were obtained. The area of concern in the left pelvic region was identified. The overlying skin was prepped and draped in sterile fashion. Skin and soft tissues were anesthetized with 1% lidocaine. 13 gauge trocar needle was directed into the abscess with CT guidance. Thick purulent fluid was aspirated from the needle. A stiff Amplatz wire was advanced into the collection. The tract was dilated to accommodate a 10 [place] multipurpose drain. Approximately 5 mL of thick purulent fluid was removed from the collection. Drain was sutured to skin. FINDINGS: Again noted is an abscess collection in the left lower abdomen  and pelvic region. There is now oral contrast within the abscess and there is concern for a fistula connection between small bowel and sigmoid colon on sequence 2, image 28. Thick purulent fluid was removed from the collection. Drain position confirmed within the collection. IMPRESSION: CT-guided placement of a drainage catheter within the abdominopelvic abscess collection. CT images raise concern for fistula connections between small bowel, sigmoid colon and the  abscess. Electronically Signed   By: Markus Daft M.D.   On: 03/10/2018 13:11       Assessment / Plan:     #1 52 yo female with 4-6 week hx of abdominal pain, cramping  And change in bowel habits , fever and chills on day of admit   CT shows findings consistent with Crohns  disease with interloop abscess Left hemipelvis, severe sigmoid thickening , mural thickening of distal jejunum /proximal ileum and probable enteroenteric vs enterocolonic fistulae.  She is improving on Zosyn - Cultures pending ID/sens WBC normalized  Started IV Solumedrol yesterday   JP in place   Will need Colonoscopy at some point - will discuss timing   Biologic labs in process    #2 smoker - On N icoderm patch - will leave order to go outside with family to smoke      Contact  Amy [place], P.A.-C               [phone]      Principal Problem:   Intra-abdominal abscess ([place]) Active Problems:   Tobacco abuse   COPD (chronic obstructive pulmonary disease) ([place])   Sepsis ([place])   Enteroenteric fistula     LOS: 3 days   Amy [place]  03/12/2018, 8:51 AM    Attending physician's note   I have discussed this case at length on rounds, taken an interval history, reviewed the chart and examined the patient. I agree with the Advanced Practitioner's note, impression, and recommendations. She has improved clinically since IR drain placement and IV Abx along with empiric initiation of IV steroids for likely dx of new onset IBD (ie, penetrating Crohns Disease). Discussed additional, albeit less likley, ddx, to include complicated diverticular disease, malignancy, etc. Given the very recent drain placement and fistulae, along with the reduced likelihood for change in management based on results, would actually recommend holding off on diagnostic colonoscopy at this time in favor of continued medical management. Can perform colonoscopy as an outpatient in 6 weeks to establish dx and eval for extent of  involvement. 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 We briefly discussed these medications today, to include biologic therapy and combination therapy with a "top down" approach using biologic + immunomodulator vs MTX. Labs pending for those. Additionally, stressed the importance of smoking cessation for overall health as well as promote healing fistulae/IBD. Overall, she has a very high risk for surgical intervention given her presumed penetrating Crohns. As for antimicrobial therapy, will resume IV Abx for now then will tailor to pending cx s/s and transition to PO Abx. Will discuss overall duration of Abx pending cxs and response to therapy, particularly after transition to PO. She is o/w tolerating PO and with formed stools without urgency, overt GIB, tenesmus, etc. Will continue to follow closely.    [address] DO, FACG [phone] office

## 2018-03-12 NOTE — Progress Notes (Signed)
Jonasia Coiner [id number] 19-Jun-1966  CARE TEAM:  PCP: Patient, No Pcp Per  Outpatient Care Team: Patient Care Team: Patient, No Pcp Per as PCP - General (General Practice)  Inpatient Treatment Team: Treatment Team: Attending Provider: Alma Friendly, MD; Consulting Physician: Edison Pace, Md, MD; Registered Nurse: Vella Redhead, RN; Consulting Physician: Rush Landmark Telford Nab., MD; Rounding Team: Dorthy Cooler Radiology, MD; Registered Nurse: Elza Rafter, RN; Rounding Team: Fatima Blank, MD; Registered Nurse: Heloise Ochoa, RN; Technician: Abbe Amsterdam, NT; Registered Nurse: Charlyne Petrin, RN; Case Manager: Guadalupe Maple, RN   Problem List:   Principal Problem:   Intra-abdominal abscess [facility]) Active Problems:   Enteroenteric fistula   Tobacco abuse   COPD (chronic obstructive pulmonary disease) ([place])   Sepsis ([place])     Assessment  Pelvic interloop abscess collection strongly suspicious for Crohn's inflammatory bowel disease with entero--entero-fistula.  Hospital Stay = 3 days  Plan:  inra-abdominal abscess, possible inflammatory bowel disease IR drain placement 8/25 successful              IV abx's             WBC improved Feculent output in drain most likely consistent with low volume enterocolonic fistula.  Contained and not toxic.             Full liquid diet.  Would hold off on advancing beyond this for now  GI considering timing of colonoscopy, possible IV steroids.    Surgery will continue to follow for now, but ideally would try and treat this with drainage, antibiotics for infection and immunosuppression for probable inflammatory bowel disease.  Try to avoid the need of surgery this admission as risk of leak and/or ostomy markedly increased.  -VTE prophylaxis- SCDs, etc -mobilize as tolerated to help recovery  25 minutes spent in review, evaluation, examination, counseling, and coordination of care.  More than 50% of  that time was spent in counseling.  03/12/2018    Subjective: (Chief complaint)  Feeling better.  Walking hallways.  Abdominal pain more localized pelvis and less.  Tolerating full liquid diet.  Objective:  Vital signs:  Vitals:   03/11/18 0526 03/11/18 1455 03/11/18 2019 03/12/18 0611  BP: (!) 159/107 (!) 173/105 (!) 152/94 (!) 160/95  Pulse: 70 71 83 70  Resp: _0 Temp: 98.5 F (36.9 C) 98.5 F (36.9 C) 98.6 F (37 C) 98.2 F (36.8 C)  TempSrc: Oral Oral Oral Oral  SpO2: 95% 98% 97% 99%  Weight:      Height:        Last BM Date: 03/09/18  Intake/Output   Yesterday:  08/25 0701 - 08/26 0700 In: 2588.3 [P.O.:720; I.V.:1730.7; IV Piggyback:137.6] Out: 4310 [Urine:4300; Drains:10] This shift:  Total I/O In: 480 [P.O.:480] Out: 300 [Urine:300]  Bowel function:  Flatus: YES  BM:  YES  Drain: Feculent   Physical Exam:  General: Pt awake/alert/oriented x4 in no acute distress Eyes: PERRL, normal EOM.  Sclera clear.  No icterus Neuro: CN II-XII intact w/o focal sensory/motor deficits. Lymph: No head/neck/groin lymphadenopathy Psych:  No delerium/psychosis/paranoia HENT: Normocephalic, Mucus membranes moist.  No thrush Neck: Supple, No tracheal deviation Chest: No chest wall pain w good excursion CV:  Pulses intact.  Regular rhythm MS: Normal AROM mjr joints.  No obvious deformity  Abdomen: Soft.  Nondistended.  Tenderness at Left suprapubic region only.  No evidence of peritonitis.  No incarcerated hernias.  Ext:  No deformity.  No mjr  edema.  No cyanosis Skin: No petechiae / purpura  Results:   Labs: Results for orders placed or performed during the hospital encounter of 03/09/18 (from the past 48 hour(s))  Aerobic/Anaerobic Culture (surgical/deep wound)     Status: None (Preliminary result)   Collection Time: 03/10/18 12:24 PM  Result Value Ref Range   Specimen Description      PELVIS ABSCESS Performed at [place] [address] [place], [place] [address]    Special Requests      NONE Performed at [facility], [place] [address] [place], [place] [address]    Gram Stain      FEW WBC PRESENT,BOTH PMN AND MONONUCLEAR RARE GRAM POSITIVE COCCI IN PAIRS RARE GRAM VARIABLE ROD    Culture      MODERATE GRAM NEGATIVE RODS IDENTIFICATION AND SUSCEPTIBILITIES TO FOLLOW Performed at [facility], [place] [address], [place], [place] [address]    Report Status PENDING   CBC with Differential/Platelet     Status: Abnormal   Collection Time: 03/11/18  4:50 AM  Result Value Ref Range   WBC 12.6 (H) 4.0 - 10.5 K/uL   RBC 3.23 (L) 3.87 - 5.11 MIL/uL   Hemoglobin 10.2 (L) 12.0 - 15.0 g/dL   HCT 32.3 (L) 36.0 - 46.0 %   MCV 100.0 78.0 - 100.0 fL   MCH 31.6 26.0 - 34.0 pg   MCHC 31.6 30.0 - 36.0 g/dL   RDW 16.2 (H) 11.5 - 15.5 %   Platelets 282 150 - 400 K/uL   Neutrophils Relative % 93 %   Neutro Abs 11.7 (H) 1.7 - 7.7 K/uL   Lymphocytes Relative 5 %   Lymphs Abs 0.7 0.7 - 4.0 K/uL   Monocytes Relative 2 %   Monocytes Absolute 0.3 0.1 - 1.0 K/uL   Eosinophils Relative 0 %   Eosinophils Absolute 0.0 0.0 - 0.7 K/uL   Basophils Relative 0 %   Basophils Absolute 0.0 0.0 - 0.1 K/uL    Comment: Performed at [facility], [place] [address], [place], [place] [address]  Basic metabolic panel     Status: Abnormal   Collection Time: 03/11/18  4:50 AM  Result Value Ref Range   Sodium 137 135 - 145 mmol/L   Potassium 4.5 3.5 - 5.1 mmol/L   Chloride 107 98 - 111 mmol/L   CO2 25 22 - 32 mmol/L   Glucose, Bld 196 (H) 70 - 99 mg/dL   BUN 7 6 - 20 mg/dL   Creatinine, Ser 0.47 0.44 - 1.00 mg/dL   Calcium 8.2 (L) 8.9 - 10.3 mg/dL   GFR calc non Af Amer >60 >60 mL/min   GFR calc Af Amer >60 >60 mL/min    Comment: (NOTE) The eGFR has been calculated using the CKD EPI equation. This calculation has not been validated in all clinical situations. eGFR's persistently  <60 mL/min signify possible Chronic Kidney Disease.    Anion gap 5 5 - 15    Comment: Performed at [facility], [place] [address], [place], [place] [address]  VITAMIN D 25 Hydroxy (Vit-D Deficiency, Fractures)     Status: Abnormal   Collection Time: 03/11/18 10:08 AM  Result Value Ref Range   Vit D, 25-Hydroxy 10.7 (L) 30.0 - 100.0 ng/mL    Comment: (NOTE) Vitamin D deficiency has been defined by the [place] practice guideline as a level of serum 25-OH vitamin  D less than 20 ng/mL (1,2). The Endocrine Society went on to further define vitamin D insufficiency as a level between 21 and 29 ng/mL (2). 1. IOM (Institute of Medicine). 2010. Dietary reference   intakes for calcium and D. [place]: The   [employer]. 2. Holick MF, Binkley Sidney, Bischoff-Ferrari HA, et al.   Evaluation, treatment, and prevention of vitamin D   deficiency: an Endocrine Society clinical practice   guideline. JCEM. 2011 Jul; 96(7):1911-30. Performed At: [facility] [place], [place] [id number] Rush Farmer MD FR:[id number]   Glucose, capillary     Status: Abnormal   Collection Time: 03/11/18  4:34 PM  Result Value Ref Range   Glucose-Capillary 109 (H) 70 - 99 mg/dL  Glucose, capillary     Status: Abnormal   Collection Time: 03/11/18 10:50 PM  Result Value Ref Range   Glucose-Capillary 157 (H) 70 - 99 mg/dL  CBC with Differential/Platelet     Status: Abnormal   Collection Time: 03/12/18  4:17 AM  Result Value Ref Range   WBC 9.9 4.0 - 10.5 K/uL   RBC 3.40 (L) 3.87 - 5.11 MIL/uL   Hemoglobin 10.7 (L) 12.0 - 15.0 g/dL   HCT 32.6 (L) 36.0 - 46.0 %   MCV 95.9 78.0 - 100.0 fL   MCH 31.5 26.0 - 34.0 pg   MCHC 32.8 30.0 - 36.0 g/dL   RDW 16.1 (H) 11.5 - 15.5 %   Platelets 277 150 - 400 K/uL   Neutrophils Relative % 87 %   Neutro Abs 8.6 (H) 1.7 - 7.7 K/uL   Lymphocytes Relative 9 %   Lymphs Abs 0.9 0.7 - 4.0  K/uL   Monocytes Relative 4 %   Monocytes Absolute 0.4 0.1 - 1.0 K/uL   Eosinophils Relative 0 %   Eosinophils Absolute 0.0 0.0 - 0.7 K/uL   Basophils Relative 0 %   Basophils Absolute 0.0 0.0 - 0.1 K/uL    Comment: Performed at [facility], [place] [address] [place], [place] [address]  Basic metabolic panel     Status: Abnormal   Collection Time: 03/12/18  4:17 AM  Result Value Ref Range   Sodium 139 135 - 145 mmol/L   Potassium 4.5 3.5 - 5.1 mmol/L   Chloride 105 98 - 111 mmol/L   CO2 27 22 - 32 mmol/L   Glucose, Bld 126 (H) 70 - 99 mg/dL   BUN <5 (L) 6 - 20 mg/dL   Creatinine, Ser 0.49 0.44 - 1.00 mg/dL   Calcium 8.5 (L) 8.9 - 10.3 mg/dL   GFR calc non Af Amer >60 >60 mL/min   GFR calc Af Amer >60 >60 mL/min    Comment: (NOTE) The eGFR has been calculated using the CKD EPI equation. This calculation has not been validated in all clinical situations. eGFR's persistently <60 mL/min signify possible Chronic Kidney Disease.    Anion gap 7 5 - 15    Comment: Performed at [facility], [place] [address] [place], [place] [address]  Glucose, capillary     Status: Abnormal   Collection Time: 03/12/18  7:56 AM  Result Value Ref Range   Glucose-Capillary 111 (H) 70 - 99 mg/dL    Imaging / Studies: Ct Image Guided Drainage By Percutaneous Catheter  Result Date: 03/10/2018 INDICATION: 52 year old with an abdominopelvic abscess. Concern for bowel fistula on previous imaging. EXAM: CT GUIDED DRAINAGE OF ABDOMINOPELVIC ABSCESS MEDICATIONS: The patient is currently admitted to the  hospital and receiving intravenous antibiotics. ANESTHESIA/SEDATION: 4.0 mg IV Versed 100 mcg IV Fentanyl Moderate Sedation Time:  39 minutes The patient was continuously monitored during the procedure by the interventional radiology nurse under my direct supervision. COMPLICATIONS: None immediate. TECHNIQUE: Informed written consent was obtained from the patient after a thorough  discussion of the procedural risks, benefits and alternatives. All questions were addressed. A timeout was performed prior to the initiation of the procedure. PROCEDURE: Patient was placed supine on the CT scanner. Images through the lower abdomen and pelvis were obtained. The area of concern in the left pelvic region was identified. The overlying skin was prepped and draped in sterile fashion. Skin and soft tissues were anesthetized with 1% lidocaine. 31 gauge trocar needle was directed into the abscess with CT guidance. Thick purulent fluid was aspirated from the needle. A stiff Amplatz wire was advanced into the collection. The tract was dilated to accommodate a 10 [place] multipurpose drain. Approximately 5 mL of thick purulent fluid was removed from the collection. Drain was sutured to skin. FINDINGS: Again noted is an abscess collection in the left lower abdomen and pelvic region. There is now oral contrast within the abscess and there is concern for a fistula connection between small bowel and sigmoid colon on sequence 2, image 28. Thick purulent fluid was removed from the collection. Drain position confirmed within the collection. IMPRESSION: CT-guided placement of a drainage catheter within the abdominopelvic abscess collection. CT images raise concern for fistula connections between small bowel, sigmoid colon and the abscess. Electronically Signed   By: Markus Daft M.D.   On: 03/10/2018 13:11    Medications / Allergies: per chart  Antibiotics: Anti-infectives (From admission, onward)   Start     Dose/Rate Route Frequency Ordered Stop   03/09/18 2200  piperacillin-tazobactam (ZOSYN) IVPB 3.375 g     3.375 g 12.5 mL/hr over 240 Minutes Intravenous Every 8 hours 03/09/18 1627     03/09/18 1330  piperacillin-tazobactam (ZOSYN) IVPB 3.375 g     3.375 g 100 mL/hr over 30 Minutes Intravenous  Once 03/09/18 1322 03/09/18 1516        Note: Portions of this report may have been transcribed using  voice recognition software. Every effort was made to ensure accuracy; however, inadvertent computerized transcription errors may be present.   Any transcriptional errors that result from this process are unintentional.     Adin Hector, MD, FACS, MASCRS Gastrointestinal and Minimally Invasive Surgery    [address], [place], [place] [address] [phone] Main / Paging [phone] Fax

## 2018-03-12 NOTE — Progress Notes (Signed)
Pharmacy Antibiotic Note  Christina Miller is a 52 y.o. female admitted on 03/09/2018 with Intra-abd infection.  Pharmacy has been consulted for Zosyn dosing.  Plan: Zosyn 3.375g IV q8h (4 hour infusion).   Dosage will likely remain stable at above dosage and need for further dosage adjustment appears unlikely at present.    Will sign off at this time.  Please reconsult if a change in clinical status warrants re-evaluation of dosage.     Height: 5' 2"  (157.5 cm) Weight: 113 lb (51.3 kg) IBW/kg (Calculated) : 50.1  Temp (24hrs), Avg:98.4 F (36.9 C), Min:98.2 F (36.8 C), Max:98.6 F (37 C)  Recent Labs  Lab 03/05/18 1446 03/09/18 1315 03/09/18 1345 03/10/18 0513 03/11/18 0450 03/12/18 0417  WBC 19.1* 27.1*  --  13.9* 12.6* 9.9  CREATININE 0.70 0.51  --  0.45 0.47 0.49  LATICACIDVEN  --   --  1.73  --   --   --     Estimated Creatinine Clearance: 64.3 mL/min (by C-G formula based on SCr of 0.49 mg/dL).    Allergies  Allergen Reactions  . Hydrocodone-Acetaminophen Nausea And Vomiting   Antimicrobials this admission: 8/23 Zosyn >>   Microbiology results: 8/23 BCx: NGTD   Thank you for allowing pharmacy to be a part of this patient's care.  Royetta Asal, PharmD, BCPS Pager [phone] 03/12/2018 11:00 AM

## 2018-03-12 NOTE — Progress Notes (Signed)
PROGRESS NOTE  Christina Miller WVP:[id number] DOB: [birth year] DOA: 03/09/2018 PCP: Patient, No Pcp Per  HPI/Recap of past 24 hours: Christina Miller is a 52 y.o. female with medical history significant for COPD, HTN, GERD, MS presents to the ED with complaints of abdominal pain, nausea, vomiting for the past 3-4 weeks. Pt initially presented to [facility] ED were a CT on 8/9 was suspicious for Chron's disease and showed enteroenteric and enterocolonic fistulae. Pt was given a course of Cipro/ Flagyl and a Prednisone taper for 4 wks. She was referred to GI, of which they recommended continuing another additional 7 days of AB due to non-resolving symptoms. Pt presented to the ED again, and a repeat CT abd/pelvis showed improving inflammation but worsening of the fistulae and possibly a pelvic abscess. GI consulted and patient admitted for further management.  Today, pt reported feeling better overall. Denies any worsening abdominal pain, N/V, fever/chills. Had a soft BM today.  Assessment/Plan: Principal Problem:   Intra-abdominal abscess ([facility]) Active Problems:   Tobacco abuse   COPD (chronic obstructive pulmonary disease) ([facility])   Sepsis ([place])   Enteroenteric fistula  Sepsis likely 2/2 enteritis/possible sigmoid colitis w/pelvic abscess Current suspicion for Crohn's disease, but not confirmed Leukocytosis + fever on presentation- sepsis Currently afebrile with resolved leukocytosis (on steroid) BC X 2 NGTD LA WNL GI on board: Planning for possible colonoscopy on this admission Gen surg on board, rec IR drainage of pelvic abscess, no surgical plans IR on board: S/p pelvic abscess drainage on 03/10/18 Gram stain of abscess showed rare gram + cocci, rare gram rod, culture showed moderate E.coli sensitive to ceftriaxone Start Ceftriaxone D/C IV Zosyn Continue Solumedrol Pain and symptomatic management Continue FLD, IVF  HTN Uncontrolled, worsened by ?pain, steroids Started on Amlodipine IV  Hydralazine prn  COPD Stable Inhalers prn  MS Stable, not on any meds  Tobacco abuse Advised to quit Nicotine patch ordered       Code Status: Full  Family Communication: None at bedside  Disposition Plan: Once significant improvement and work up complete    Consultants:  GI  Gen surgery  IR  Procedures:  IR drainage of pelvic abscess on 03/10/18  Antimicrobials:  IV Ceftriaxone   DVT prophylaxis: Lovenox   Objective: Vitals:   03/11/18 1455 03/11/18 2019 03/12/18 0611 03/12/18 1336  BP: (!) 173/105 (!) 152/94 (!) 160/95 (!) 147/96  Pulse: 71 83 70 78  Resp: 16 18 18 17   Temp: 98.5 F (36.9 C) 98.6 F (37 C) 98.2 F (36.8 C) 98.7 F (37.1 C)  TempSrc: Oral Oral Oral Oral  SpO2: 98% 97% 99% 99%  Weight:      Height:        Intake/Output Summary (Last 24 hours) at 03/12/2018 1419 Last data filed at 03/12/2018 1340 Gross per 24 hour  Intake 2867.67 ml  Output 4210 ml  Net -1342.33 ml   Filed Weights   03/09/18 1353  Weight: 51.3 kg    Exam:   General: NAD, appears older than stated age  Cardiovascular: S1, S2 present  Respiratory: CTAB  Abdomen: Soft, very mild generalized tenderness, ND, BS present  Musculoskeletal: No pedal edema bilaterally   Skin: Normal   Psychiatry: Normal mood   Data Reviewed: CBC: Recent Labs  Lab 03/05/18 1446 03/09/18 1315 03/10/18 0513 03/11/18 0450 03/12/18 0417  WBC 19.1* 27.1* 13.9* 12.6* 9.9  NEUTROABS 17.3* 23.5*  --  11.7* 8.6*  HGB 13.2 12.7 10.8* 10.2* 10.7*  HCT 40.6 38.7  32.9* 32.3* 32.6*  MCV 96.4 97.2 96.8 100.0 95.9  PLT 414.0* 379 320 282 353   Basic Metabolic Panel: Recent Labs  Lab 03/05/18 1446 03/09/18 1315 03/10/18 0513 03/11/18 0450 03/12/18 0417  NA 137 138 139 137 139  K 3.6 3.6 4.5 4.5 4.5  CL 100 99 106 107 105  CO2 29 27 28 25 27   GLUCOSE 156* 100* 132* 196* 126*  BUN 16 8 6 7  <5*  CREATININE 0.70 0.51 0.45 0.47 0.49  CALCIUM 9.3 8.7* 8.1* 8.2* 8.5*    GFR: Estimated Creatinine Clearance: 64.3 mL/min (by C-G formula based on SCr of 0.49 mg/dL). Liver Function Tests: Recent Labs  Lab 03/05/18 1446 03/09/18 1315  AST 23 25  ALT 20 17  ALKPHOS 62 45  BILITOT 0.6 0.7  PROT 7.1 6.5  ALBUMIN 3.6 3.1*   Recent Labs  Lab 03/09/18 1315  LIPASE 26   No results for input(s): AMMONIA in the last 168 hours. Coagulation Profile: Recent Labs  Lab 03/10/18 0620  INR 0.96   Cardiac Enzymes: No results for input(s): CKTOTAL, CKMB, CKMBINDEX, TROPONINI in the last 168 hours. BNP (last 3 results) No results for input(s): PROBNP in the last 8760 hours. HbA1C: No results for input(s): HGBA1C in the last 72 hours. CBG: Recent Labs  Lab 03/11/18 1634 03/11/18 2250 03/12/18 0756 03/12/18 1210  GLUCAP 109* 157* 111* 111*   Lipid Profile: No results for input(s): CHOL, HDL, LDLCALC, TRIG, CHOLHDL, LDLDIRECT in the last 72 hours. Thyroid Function Tests: No results for input(s): TSH, T4TOTAL, FREET4, T3FREE, THYROIDAB in the last 72 hours. Anemia Panel: No results for input(s): VITAMINB12, FOLATE, FERRITIN, TIBC, IRON, RETICCTPCT in the last 72 hours. Urine analysis:    Component Value Date/Time   COLORURINE YELLOW 03/09/2018 1315   APPEARANCEUR CLEAR 03/09/2018 1315   APPEARANCEUR Hazy 03/20/2014 0201   LABSPEC 1.028 03/09/2018 1315   LABSPEC 1.015 03/20/2014 0201   PHURINE 8.0 03/09/2018 1315   GLUCOSEU NEGATIVE 03/09/2018 1315   GLUCOSEU Negative 03/20/2014 0201   HGBUR NEGATIVE 03/09/2018 1315   BILIRUBINUR NEGATIVE 03/09/2018 1315   BILIRUBINUR Negative 03/20/2014 0201   KETONESUR NEGATIVE 03/09/2018 1315   PROTEINUR NEGATIVE 03/09/2018 1315   UROBILINOGEN 1.0 11/04/2012 1822   NITRITE NEGATIVE 03/09/2018 1315   LEUKOCYTESUR NEGATIVE 03/09/2018 1315   LEUKOCYTESUR Negative 03/20/2014 0201   Sepsis Labs: @LABRCNTIP (procalcitonin:4,lacticidven:4)  ) Recent Results (from the past 240 hour(s))  Blood culture  (routine x 2)     Status: None (Preliminary result)   Collection Time: 03/09/18  1:15 PM  Result Value Ref Range Status   Specimen Description   Final    BLOOD RIGHT HAND Performed at [facility], [place] [address] [place], [place] [address]    Special Requests   Final    BOTTLES DRAWN AEROBIC AND ANAEROBIC Blood Culture adequate volume Performed at [place] [address], [place], [place] [address]    Culture   Final    NO GROWTH 3 DAYS Performed at [facility], [place] [address] [place], [place] [address]    Report Status PENDING  Incomplete  Blood culture (routine x 2)     Status: None (Preliminary result)   Collection Time: 03/09/18  1:46 PM  Result Value Ref Range Status   Specimen Description   Final    BLOOD RIGHT HAND Performed at [place] [address] [place], [place] [address]    Special Requests  Final    BOTTLES DRAWN AEROBIC AND ANAEROBIC Blood Culture adequate volume Performed at [place] [address] [place], [place] [address]    Culture   Final    NO GROWTH 3 DAYS Performed at [facility], [place] [address] [place], [place] [address]    Report Status PENDING  Incomplete  Aerobic/Anaerobic Culture (surgical/deep wound)     Status: None (Preliminary result)   Collection Time: 03/10/18 12:24 PM  Result Value Ref Range Status   Specimen Description   Final    PELVIS ABSCESS Performed at [place] [address] [place], [place] [address]    Special Requests   Final    NONE Performed at [facility], [place] [address] [place], [place] [address]    Gram Stain   Final    FEW WBC PRESENT,BOTH PMN AND MONONUCLEAR RARE GRAM POSITIVE COCCI IN PAIRS RARE GRAM VARIABLE ROD Performed at [facility], [place] [address], [place], [place] [address]    Culture MODERATE ESCHERICHIA COLI  Final   Report Status PENDING   Incomplete   Organism ID, Bacteria ESCHERICHIA COLI  Final      Susceptibility   Escherichia coli - MIC*    AMPICILLIN >=32 RESISTANT Resistant     CEFAZOLIN 8 SENSITIVE Sensitive     CEFEPIME <=1 SENSITIVE Sensitive     CEFTAZIDIME <=1 SENSITIVE Sensitive     CEFTRIAXONE <=1 SENSITIVE Sensitive     CIPROFLOXACIN >=4 RESISTANT Resistant     GENTAMICIN <=1 SENSITIVE Sensitive     IMIPENEM <=0.25 SENSITIVE Sensitive     TRIMETH/SULFA <=20 SENSITIVE Sensitive     AMPICILLIN/SULBACTAM >=32 RESISTANT Resistant     PIP/TAZO 8 SENSITIVE Sensitive     Extended ESBL NEGATIVE Sensitive     * MODERATE ESCHERICHIA COLI      Studies: No results found.  Scheduled Meds: . amLODipine  5 mg Oral Daily  . enoxaparin (LOVENOX) injection  40 mg Subcutaneous Q24H  . famotidine  20 mg Oral BID  . methylPREDNISolone (SOLU-MEDROL) injection  20 mg Intravenous Q8H  . nicotine  21 mg Transdermal Daily  . sodium chloride flush  5 mL Intracatheter Q8H    Continuous Infusions: . sodium chloride 50 mL/hr at 03/12/18 0200  . sodium chloride Stopped (03/11/18 0358)  . piperacillin-tazobactam (ZOSYN)  IV 3.375 g (03/12/18 0531)     LOS: 3 days     Alma Friendly, MD [facility]   If 7PM-7AM, please contact night-coverage [email or web address] Password [facility] 03/12/2018, 2:19 PM

## 2018-03-13 ENCOUNTER — Encounter (HOSPITAL_COMMUNITY)
Admission: EM | Disposition: A | Discharge status: Home / Self Care | Payer: Self-pay | Source: Ambulatory Visit | Attending: Internal Medicine

## 2018-03-13 LAB — CBC WITH DIFFERENTIAL/PLATELET
BASOS ABS: 0 10*3/uL (ref 0.0–0.1)
Basophils Relative: 0 %
EOS PCT: 0 %
Eosinophils Absolute: 0 10*3/uL (ref 0.0–0.7)
HCT: 33.8 % — ABNORMAL LOW (ref 36.0–46.0)
Hemoglobin: 11.1 g/dL — ABNORMAL LOW (ref 12.0–15.0)
LYMPHS ABS: 1 10*3/uL (ref 0.7–4.0)
Lymphocytes Relative: 14 %
MCH: 32.1 pg (ref 26.0–34.0)
MCHC: 32.8 g/dL (ref 30.0–36.0)
MCV: 97.7 fL (ref 78.0–100.0)
MONO ABS: 0.5 10*3/uL (ref 0.1–1.0)
MONOS PCT: 7 %
Neutro Abs: 5.5 10*3/uL (ref 1.7–7.7)
Neutrophils Relative %: 79 %
PLATELETS: 313 10*3/uL (ref 150–400)
RBC: 3.46 MIL/uL — ABNORMAL LOW (ref 3.87–5.11)
RDW: 16.2 % — AB (ref 11.5–15.5)
WBC: 7 10*3/uL (ref 4.0–10.5)

## 2018-03-13 LAB — BASIC METABOLIC PANEL
Anion gap: 7 (ref 5–15)
BUN: 5 mg/dL — AB (ref 6–20)
CALCIUM: 8.9 mg/dL (ref 8.9–10.3)
CO2: 29 mmol/L (ref 22–32)
CREATININE: 0.54 mg/dL (ref 0.44–1.00)
Chloride: 105 mmol/L (ref 98–111)
GFR calc Af Amer: >60 mL/min (ref >60)
GFR calc non Af Amer: >60 mL/min (ref >60)
GLUCOSE: 131 mg/dL — AB (ref 70–99)
Potassium: 4.6 mmol/L (ref 3.5–5.1)
Sodium: 141 mmol/L (ref 135–145)

## 2018-03-13 LAB — GLUCOSE, CAPILLARY
GLUCOSE-CAPILLARY: 131 mg/dL — AB (ref 70–99)
Glucose-Capillary: 106 mg/dL — ABNORMAL HIGH (ref 70–99)
Glucose-Capillary: 105 mg/dL — ABNORMAL HIGH (ref 70–99)
Glucose-Capillary: 111 mg/dL — ABNORMAL HIGH (ref 70–99)

## 2018-03-13 SURGERY — COLONOSCOPY
Anesthesia: Moderate Sedation

## 2018-03-13 MED ORDER — PREDNISONE 20 MG PO TABS
60.0000 mg | ORAL_TABLET | Freq: Every day | ORAL | Status: DC
  Administered 2018-03-14 – 2018-03-15 (×2): 60 mg via ORAL
  Filled 2018-03-13 (×2): qty 3

## 2018-03-13 MED ORDER — METHYLPREDNISOLONE SODIUM SUCC 40 MG IJ SOLR
20.0000 mg | Freq: Three times a day (TID) | INTRAMUSCULAR | Status: AC
  Administered 2018-03-13: 20 mg via INTRAVENOUS
  Filled 2018-03-13: qty 1

## 2018-03-13 NOTE — Progress Notes (Signed)
Patient ID: Christina Miller, female   DOB: Mar 23, 1966, 52 y.o.   MRN: [id number]  [facility] ROUNDING NOTE   Subjective: No acute events overnight. VSS and afebrile. She states she continues to feel better and only mild discomfort at the site of her percutaneous drain. Otherwise, tolerating PO and with 1 formed, non-bloody stool earlier this AM. No tenesmus, dyschezia, urgency. Cx from fluid collection n/f E coli sensitive to Ceftriaxone. Abx changed o/n Ceftriaxone, and no noted med ADRs by patient.   HD #5 Ceftriaxone Solumedrol --> changing to PO Prednisone today JP drain: 4 cc recorded out   Objective: Vital signs in last 24 hours: Temp:  [97.9 F (36.6 C)-98.8 F (37.1 C)] 97.9 F (36.6 C) (08/27 0945) Pulse Rate:  [70-82] 72 (08/27 0945) Resp:  [16-18] 18 (08/27 0945) BP: (142-167)/(81-119) 167/100 (08/27 0945) SpO2:  [96 %-99 %] 98 % (08/27 0945) Last BM Date: 03/13/18 General:    white female in NAD Heart:  Regular rate and rhythm; no murmurs Lungs: Respirations even and unlabored, lungs CTA bilaterally Abdomen:  Soft, minimal TTP in LLQ near drain site, but no rebound or guarding. Nondistended. Normal bowel sounds. Drain in LLQ with minimal output Extremities:  Without edema. Neurologic:  Alert and oriented,  grossly normal neurologically. Psych:  Cooperative. Normal mood and affect.   Intake/Output from previous day: 08/26 0701 - 08/27 0700 In: 3325.9 [P.O.:2045; I.V.:1131.8; IV Piggyback:144.1] Out: 3804 [Urine:3800; Drains:4] Intake/Output this shift: No intake/output data recorded.   Lab Results: Recent Labs    03/11/18 0450 03/12/18 0417 03/13/18 0515  WBC 12.6* 9.9 7.0  HGB 10.2* 10.7* 11.1*  PLT 282 277 313  MCV 100.0 95.9 97.7   BMET Recent Labs    03/11/18 0450 03/12/18 0417 03/13/18 0515  NA 137 139 141  K 4.5 4.5 4.6  CL 107 105 105  CO2 25 27 29   GLUCOSE 196* 126* 131*  BUN 7 <5* 5*  CREATININE 0.47 0.49 0.54  CALCIUM  8.2* 8.5* 8.9   LFT No results for input(s): PROT, ALBUMIN, AST, ALT, ALKPHOS, BILITOT, BILIDIR, IBILI in the last 72 hours. PT/INR No results for input(s): INR in the last 72 hours.    Imaging/Other results: No results found.    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 Now s/p IR drain with minimal output in last 24 hours along with clinical improvement with IV Abx. Abx changed to Ceftriaxone per abscess culture s/s (E coli).    Plan  - Change Solumedrol to Prednisone 60 mg PO daily x2 weeks, then reduce to 40 mg PO daily for 2 weeks, then reduce by 10 mg every 7 days until complete - Agree with change in IV ABx, with plan to transition to PO Abx tomorrow - IR to assess drain and will discuss timing of removal - Will discuss optimal duration of Abx pending drain assessment and continued clinical improvement  - Labs pending for eventual initiation of long-term immunosuppressive therapy - Plan for colonoscopy in approx 6 weeks as an outpatient - We had an in depth discussion of Crohns Disease and long term medical management, to include biologic therapy and combination therapy with a "top down" approach using biologic + immunomodulator (or MTX).  - Again stressed the importance of smoking cessation for overall health as well as promote healing fistulae/IBD     Lavena Bullion, DO  03/13/2018, 9:48 AM [facility] Pager [phone]  [phone]  

## 2018-03-13 NOTE — Progress Notes (Signed)
[facility] Progress Note     Subjective: CC-  States that she is feeling much better. Denies any current abdominal pain. Denies n/v. Tolerating full liquids and having bowel function. Stools still soft but less liquid. Per patient GI plans to start her on oral steroid today and transition to oral abx tomorrow.  Objective: Vital signs in last 24 hours: Temp:  [97.9 F (36.6 C)-98.8 F (37.1 C)] 97.9 F (36.6 C) (08/27 0945) Pulse Rate:  [70-82] 72 (08/27 0945) Resp:  [16-18] 18 (08/27 0945) BP: (142-167)/(81-119) 167/100 (08/27 0945) SpO2:  [96 %-99 %] 98 % (08/27 0945) Last BM Date: 03/13/18  Intake/Output from previous day: 08/26 0701 - 08/27 0700 In: 3325.9 [P.O.:2045; I.V.:1131.8; IV Piggyback:144.1] Out: 3804 [Urine:3800; Drains:4] Intake/Output this shift: No intake/output data recorded.  PE: Gen:  Alert, NAD, pleasant HEENT: EOM's intact, pupils equal and round Card:  RRR Pulm:  CTAB, no W/R/R, effort normal Abd: Soft, NT/ND, +BS, drain with trace feculent fluid in bulb Ext:  Calves soft and nontender Psych: A&Ox3  Skin: no rashes noted, warm and dry  Lab Results:  Recent Labs    03/12/18 0417 03/13/18 0515  WBC 9.9 7.0  HGB 10.7* 11.1*  HCT 32.6* 33.8*  PLT 277 313   BMET Recent Labs    03/12/18 0417 03/13/18 0515  NA 139 141  K 4.5 4.6  CL 105 105  CO2 27 29  GLUCOSE 126* 131*  BUN <5* 5*  CREATININE 0.49 0.54  CALCIUM 8.5* 8.9   PT/INR No results for input(s): LABPROT, INR in the last 72 hours. CMP     Component Value Date/Time   NA 141 03/13/2018 0515   NA 145 03/19/2014 1829   K 4.6 03/13/2018 0515   K 3.4 (L) 03/19/2014 1829   CL 105 03/13/2018 0515   CL 112 (H) 03/19/2014 1829   CO2 29 03/13/2018 0515   CO2 27 03/19/2014 1829   GLUCOSE 131 (H) 03/13/2018 0515   GLUCOSE 92 03/19/2014 1829   BUN 5 (L) 03/13/2018 0515   BUN 8 03/19/2014 1829   CREATININE 0.54 03/13/2018 0515   CREATININE 0.70 03/19/2014 1829   CREATININE 0.67 06/02/2011 1604   CALCIUM 8.9 03/13/2018 0515   CALCIUM 8.4 (L) 03/19/2014 1829   PROT 6.5 03/09/2018 1315   PROT 7.4 03/19/2014 1829   ALBUMIN 3.1 (L) 03/09/2018 1315   ALBUMIN 3.5 03/19/2014 1829   AST 25 03/09/2018 1315   AST 28 03/19/2014 1829   ALT 17 03/09/2018 1315   ALT 14 03/19/2014 1829   ALKPHOS 45 03/09/2018 1315   ALKPHOS 58 03/19/2014 1829   BILITOT 0.7 03/09/2018 1315   BILITOT 0.2 03/19/2014 1829   GFRNONAA >60 03/13/2018 0515   GFRNONAA >60 03/19/2014 1829   GFRAA >60 03/13/2018 0515   GFRAA >60 03/19/2014 1829   Lipase     Component Value Date/Time   LIPASE 26 03/09/2018 1315       Studies/Results: No results found.  Anti-infectives: Anti-infectives (From admission, onward)   Start     Dose/Rate Route Frequency Ordered Stop   03/12/18 1500  cefTRIAXone (ROCEPHIN) 2 g in sodium chloride 0.9 % 100 mL IVPB     2 g 200 mL/hr over 30 Minutes Intravenous Every 24 hours 03/12/18 1430     03/09/18 2200  piperacillin-tazobactam (ZOSYN) IVPB 3.375 g  Status:  Discontinued     3.375 g 12.5 mL/hr over 240 Minutes Intravenous Every 8 hours 03/09/18 1627 03/12/18 1430  03/09/18 1330  piperacillin-tazobactam (ZOSYN) IVPB 3.375 g     3.375 g 100 mL/hr over 30 Minutes Intravenous  Once 03/09/18 1322 03/09/18 1516       Assessment/Plan HTN COPD MS GERD Tobacco abuse  Intra-abdominal abscess, possible inflammatory bowel disease - s/p IR drain placement 8/24, cx grew e coli - drain output low volume, feculent - on IV steroids, GI planning outpatient colonoscopy  ID - rocephin 8/26>>, zosyn 8/23>>8/26 FEN - soft diet VTE - SCDs, lovenox Foley - none  Plan - Abdominal exam benign and WBC WNL. [place] for soft diet. Steroids per GI. Continue IV abx and drain. Continue ambulating.    LOS: 4 days    Wellington Hampshire , [facility] 03/13/2018, 9:49 AM Pager: [phone] Consults: [phone] Mon 7:00 am -11:30 AM Tues-Fri  7:00 am-4:30 pm Sat-Sun 7:00 am-11:30 am

## 2018-03-13 NOTE — Progress Notes (Signed)
PROGRESS NOTE  Ariona Deschene GYK:[id number] DOB: 1966-04-27 DOA: 03/09/2018 PCP: Christina Miller, No Pcp Per  HPI/Recap of past 24 hours: Christina Miller is a 52 y.o. female with medical history significant for COPD, HTN, GERD, MS presents to the ED with complaints of abdominal pain, nausea, vomiting for the past 3-4 weeks. Pt initially presented to [facility] ED were a CT on 8/9 was suspicious for Chron's disease and showed enteroenteric and enterocolonic fistulae. Pt was given a course of Cipro/ Flagyl and a Prednisone taper for 4 wks. She was referred to GI, of which they recommended continuing another additional 7 days of AB due to non-resolving symptoms. Pt presented to the ED again, and a repeat CT abd/pelvis showed improving inflammation but worsening of the fistulae and possibly a pelvic abscess. GI consulted and Christina Miller admitted for further management.  Today, pt reported feeling much better. Denies any worsening abdominal pain, fever/chills. Tolerated a soft diet.  Assessment/Plan: Principal Problem:   Intra-abdominal abscess (HCC) Active Problems:   Tobacco abuse   COPD (chronic obstructive pulmonary disease) (HCC)   Sepsis ([place])   Enteroenteric fistula  Sepsis likely 2/2 enteritis/possible sigmoid colitis w/pelvic abscess Current suspicion for Crohn's disease, but not confirmed Leukocytosis + fever on presentation- sepsis Currently afebrile with resolved leukocytosis (on steroid) BC X 2 NGTD LA WNL GI on board: Planning for colonoscopy as an outpt in about 6 weeks Gen surg on board, rec IR drainage of pelvic abscess, no surgical plans IR on board: S/p pelvic abscess drainage on 03/10/18 Gram stain of abscess showed rare gram + cocci, rare gram rod, culture showed moderate E.coli sensitive to ceftriaxone Continue Ceftriaxone- plan to switch to PO tomorrow as per GI D/C IV Zosyn S/P Solumedrol, GI rec to change to Prednisone with a tapering dose (60 mg PO daily x2 weeks, then reduce to 40  mg PO daily for 2 weeks, then reduce by 10 mg every 7 days until complete) Pain and symptomatic management  HTN Uncontrolled, worsened by ?pain, steroids Started on Amlodipine IV Hydralazine prn  COPD Stable Inhalers prn  MS Stable, not on any meds  Tobacco abuse Advised to quit Nicotine patch ordered       Code Status: Full  Family Communication: None at bedside  Disposition Plan: Once significant improvement and work up complete, likely 03/14/18   Consultants:  GI  Gen surgery  IR  Procedures:  IR drainage of pelvic abscess on 03/10/18  Antimicrobials:  IV Ceftriaxone   DVT prophylaxis: Lovenox   Objective: Vitals:   03/13/18 0609 03/13/18 0945 03/13/18 1207 03/13/18 1345  BP: (!) 162/119 (!) 167/100 (!) 166/104 (!) 141/94  Pulse: 70 72 88 86  Resp: 16 18    Temp: 98 F (36.7 C) 97.9 F (36.6 C)    TempSrc: Oral Oral    SpO2: 96% 98%    Weight:      Height:        Intake/Output Summary (Last 24 hours) at 03/13/2018 1440 Last data filed at 03/13/2018 1129 Gross per 24 hour  Intake 2076.59 ml  Output 3304 ml  Net -1227.41 ml   Filed Weights   03/09/18 1353  Weight: 51.3 kg    Exam:   General: NAD, appears older than stated age  Cardiovascular: S1, S2 present  Respiratory: CTAB  Abdomen: Soft, very mild tenderness around drain site, ND, BS present  Musculoskeletal: No pedal edema bilaterally   Skin: Normal   Psychiatry: Normal mood   Data Reviewed: CBC: Recent  Labs  Lab 03/09/18 1315 03/10/18 0513 03/11/18 0450 03/12/18 0417 03/13/18 0515  WBC 27.1* 13.9* 12.6* 9.9 7.0  NEUTROABS 23.5*  --  11.7* 8.6* 5.5  HGB 12.7 10.8* 10.2* 10.7* 11.1*  HCT 38.7 32.9* 32.3* 32.6* 33.8*  MCV 97.2 96.8 100.0 95.9 97.7  PLT 379 320 282 277 732   Basic Metabolic Panel: Recent Labs  Lab 03/09/18 1315 03/10/18 0513 03/11/18 0450 03/12/18 0417 03/13/18 0515  NA 138 139 137 139 141  K 3.6 4.5 4.5 4.5 4.6  CL 99 106 107 105  105  CO2 27 28 25 27 29   GLUCOSE 100* 132* 196* 126* 131*  BUN 8 6 7  <5* 5*  CREATININE 0.51 0.45 0.47 0.49 0.54  CALCIUM 8.7* 8.1* 8.2* 8.5* 8.9   GFR: Estimated Creatinine Clearance: 64.3 mL/min (by C-G formula based on SCr of 0.54 mg/dL). Liver Function Tests: Recent Labs  Lab 03/09/18 1315  AST 25  ALT 17  ALKPHOS 45  BILITOT 0.7  PROT 6.5  ALBUMIN 3.1*   Recent Labs  Lab 03/09/18 1315  LIPASE 26   No results for input(s): AMMONIA in the last 168 hours. Coagulation Profile: Recent Labs  Lab 03/10/18 0620  INR 0.96   Cardiac Enzymes: No results for input(s): CKTOTAL, CKMB, CKMBINDEX, TROPONINI in the last 168 hours. BNP (last 3 results) No results for input(s): PROBNP in the last 8760 hours. HbA1C: No results for input(s): HGBA1C in the last 72 hours. CBG: Recent Labs  Lab 03/12/18 1210 03/12/18 1602 03/12/18 2133 03/13/18 0722 03/13/18 1158  GLUCAP 111* 90 101* 106* 105*   Lipid Profile: No results for input(s): CHOL, HDL, LDLCALC, TRIG, CHOLHDL, LDLDIRECT in the last 72 hours. Thyroid Function Tests: No results for input(s): TSH, T4TOTAL, FREET4, T3FREE, THYROIDAB in the last 72 hours. Anemia Panel: No results for input(s): VITAMINB12, FOLATE, FERRITIN, TIBC, IRON, RETICCTPCT in the last 72 hours. Urine analysis:    Component Value Date/Time   COLORURINE YELLOW 03/09/2018 1315   APPEARANCEUR CLEAR 03/09/2018 1315   APPEARANCEUR [place] 03/20/2014 0201   LABSPEC 1.028 03/09/2018 1315   LABSPEC 1.015 03/20/2014 0201   PHURINE 8.0 03/09/2018 1315   GLUCOSEU NEGATIVE 03/09/2018 1315   GLUCOSEU Negative 03/20/2014 0201   HGBUR NEGATIVE 03/09/2018 1315   BILIRUBINUR NEGATIVE 03/09/2018 1315   BILIRUBINUR Negative 03/20/2014 0201   KETONESUR NEGATIVE 03/09/2018 1315   PROTEINUR NEGATIVE 03/09/2018 1315   UROBILINOGEN 1.0 11/04/2012 1822   NITRITE NEGATIVE 03/09/2018 1315   LEUKOCYTESUR NEGATIVE 03/09/2018 1315   LEUKOCYTESUR Negative 03/20/2014 0201    Sepsis Labs: @LABRCNTIP (procalcitonin:4,lacticidven:4)  ) Recent Results (from the past 240 hour(s))  Blood culture (routine x 2)     Status: None (Preliminary result)   Collection Time: 03/09/18  1:15 PM  Result Value Ref Range Status   Specimen Description   Final    BLOOD RIGHT HAND Performed at [facility], [place] [address], [place], [place] [address]    Special Requests   Final    BOTTLES DRAWN AEROBIC AND ANAEROBIC Blood Culture adequate volume Performed at [facility] [address] [place], [place] [address]    Culture   Final    NO GROWTH 4 DAYS Performed at [facility], [place] [address], [place], [place] [address]    Report Status PENDING  Incomplete  Blood culture (routine x 2)     Status: None (Preliminary result)   Collection Time: 03/09/18  1:46 PM  Result Value  Ref Range Status   Specimen Description   Final    BLOOD RIGHT HAND Performed at [facility] [address] [place], [place] [address]    Special Requests   Final    BOTTLES DRAWN AEROBIC AND ANAEROBIC Blood Culture adequate volume Performed at [facility] [address] [place], [place] [address]    Culture   Final    NO GROWTH 4 DAYS Performed at [facility], [place] [address] [place], [place] [address]    Report Status PENDING  Incomplete  Aerobic/Anaerobic Culture (surgical/deep wound)     Status: None (Preliminary result)   Collection Time: 03/10/18 12:24 PM  Result Value Ref Range Status   Specimen Description   Final    PELVIS ABSCESS Performed at [facility] [address] [place], [place] [address]    Special Requests   Final    NONE Performed at [facility], [place] [address], [place], [place] [address]    Gram Stain   Final    FEW WBC PRESENT,BOTH PMN AND MONONUCLEAR RARE GRAM POSITIVE COCCI IN PAIRS RARE GRAM VARIABLE ROD    Culture   Final     MODERATE ESCHERICHIA COLI MODERATE BIFIDOBACTERIUM SPECIES BETA LACTAMASE POSITIVE Performed at [facility], [place] [address], [place], [place] [address]    Report Status PENDING  Incomplete   Organism ID, Bacteria ESCHERICHIA COLI  Final      Susceptibility   Escherichia coli - MIC*    AMPICILLIN >=32 RESISTANT Resistant     CEFAZOLIN 8 SENSITIVE Sensitive     CEFEPIME <=1 SENSITIVE Sensitive     CEFTAZIDIME <=1 SENSITIVE Sensitive     CEFTRIAXONE <=1 SENSITIVE Sensitive     CIPROFLOXACIN >=4 RESISTANT Resistant     GENTAMICIN <=1 SENSITIVE Sensitive     IMIPENEM <=0.25 SENSITIVE Sensitive     TRIMETH/SULFA <=20 SENSITIVE Sensitive     AMPICILLIN/SULBACTAM >=32 RESISTANT Resistant     PIP/TAZO 8 SENSITIVE Sensitive     Extended ESBL NEGATIVE Sensitive     * MODERATE ESCHERICHIA COLI      Studies: No results found.  Scheduled Meds: . amLODipine  5 mg Oral Daily  . enoxaparin (LOVENOX) injection  40 mg Subcutaneous Q24H  . famotidine  20 mg Oral BID  . methylPREDNISolone (SOLU-MEDROL) injection  20 mg Intravenous Q8H  . nicotine  21 mg Transdermal Daily  . sodium chloride flush  5 mL Intracatheter Q8H    Continuous Infusions: . sodium chloride Stopped (03/11/18 0358)  . cefTRIAXone (ROCEPHIN)  IV 2 g (03/12/18 1611)     LOS: 4 days     Alma Friendly, MD [facility]   If 7PM-7AM, please contact night-coverage [email or web address] Password [email or web address] 03/13/2018, 2:40 PM

## 2018-03-14 ENCOUNTER — Inpatient Hospital Stay (HOSPITAL_COMMUNITY): Payer: Self-pay

## 2018-03-14 DIAGNOSIS — N739 Female pelvic inflammatory disease, unspecified: Secondary | ICD-10-CM

## 2018-03-14 LAB — BASIC METABOLIC PANEL
ANION GAP: 6 (ref 5–15)
BUN: 10 mg/dL (ref 6–20)
CHLORIDE: 102 mmol/L (ref 98–111)
CO2: 30 mmol/L (ref 22–32)
Calcium: 9.1 mg/dL (ref 8.9–10.3)
Creatinine, Ser: 0.58 mg/dL (ref 0.44–1.00)
GFR calc Af Amer: >60 mL/min (ref >60)
GFR calc non Af Amer: >60 mL/min (ref >60)
GLUCOSE: 142 mg/dL — AB (ref 70–99)
Potassium: 4.1 mmol/L (ref 3.5–5.1)
Sodium: 138 mmol/L (ref 135–145)

## 2018-03-14 LAB — CBC WITH DIFFERENTIAL/PLATELET
BASOS ABS: 0 10*3/uL (ref 0.0–0.1)
Basophils Relative: 0 %
Eosinophils Absolute: 0 10*3/uL (ref 0.0–0.7)
Eosinophils Relative: 0 %
HEMATOCRIT: 35.2 % — AB (ref 36.0–46.0)
HEMOGLOBIN: 11.5 g/dL — AB (ref 12.0–15.0)
LYMPHS PCT: 11 %
Lymphs Abs: 1 10*3/uL (ref 0.7–4.0)
MCH: 31.7 pg (ref 26.0–34.0)
MCHC: 32.7 g/dL (ref 30.0–36.0)
MCV: 97 fL (ref 78.0–100.0)
MONO ABS: 0.8 10*3/uL (ref 0.1–1.0)
Monocytes Relative: 9 %
NEUTROS ABS: 7.2 10*3/uL (ref 1.7–7.7)
NEUTROS PCT: 80 %
Platelets: 310 10*3/uL (ref 150–400)
RBC: 3.63 MIL/uL — AB (ref 3.87–5.11)
RDW: 15.9 % — ABNORMAL HIGH (ref 11.5–15.5)
WBC: 9 10*3/uL (ref 4.0–10.5)

## 2018-03-14 LAB — GLUCOSE, CAPILLARY
GLUCOSE-CAPILLARY: 161 mg/dL — AB (ref 70–99)
Glucose-Capillary: 142 mg/dL — ABNORMAL HIGH (ref 70–99)
Glucose-Capillary: 137 mg/dL — ABNORMAL HIGH (ref 70–99)
Glucose-Capillary: 126 mg/dL — ABNORMAL HIGH (ref 70–99)

## 2018-03-14 LAB — CULTURE, BLOOD (ROUTINE X 2)
CULTURE: NO GROWTH
Culture: NO GROWTH
SPECIAL REQUESTS: ADEQUATE
Special Requests: ADEQUATE

## 2018-03-14 MED ORDER — IOHEXOL 300 MG/ML  SOLN
100.0000 mL | Freq: Once | INTRAMUSCULAR | Status: AC | PRN
  Administered 2018-03-14: 80 mL via INTRAVENOUS

## 2018-03-14 NOTE — Progress Notes (Addendum)
Patient ID: Christina Miller, female   DOB: 12-04-1965, 52 y.o.   MRN: [id number] Follow-up CT abdomen pelvis today revealed persistent fluid collection at drain site containing enteric contrast with fistula to adjacent sigmoid colon and adjacent small bowel suspected.  Minimal improvement in pelvic inflammation.  Wall thickening persist of the sigmoid colon with diverticular disease noted.  Appearance suggest some improvement in the acute diverticulitis/colitis.  No new abdominal or pelvic collections.  No obstruction/ileus or free air.  Patient's drain switched from JP to gravity bag.  Recommend continued drain irrigation, 3 times daily while inpatient and once daily as outpatient, recording of output and dressing changes every 1 to 2 days.  Recommend follow-up CT with drain injection in 1 to 2 weeks.  Patient, nurse and CCS notified of findings.

## 2018-03-14 NOTE — Progress Notes (Signed)
[facility] Progress Note   Chief Complaint:   Suspected fistulizing Crohn's disease with intraabdominal abscess     SUBJECTIVE:   No acute events overnight. She continues to do well. Tolerating PO intake and no complaints today. Formed, non-bloody stools, no abdominal pain. Minimal output from JP overnight.    ASSESSMENT AND PLAN:   56. 52 yo female with suspected fistulizing Crohn's disease with intraabdominal abscess and multiple colo-enteric / enteroenteric fistulae. She is s/p IR drain.  -Labs including CBC, CMET today are unremarkable. On Ceftriaxone now per culture results. Ok to transition to PO Abx today (E. Coli). Plan to continue Abx while drain in place -she has been transitioned from IV to tapering dose of oral steroids. To take 60 mg daily x2 weeks, then 40 mg PO daily for 2 weeks, then reduce by 10 mg every 7 days until complete.  - Agree with plan for repeat CT abd/pelvis today to assess for abscess improvement/resolution - JP drain duration per IR recommendations. Will typically continue Abx 3-4 days past drain removal -will need outpatient colonoscopy in ~ 6 weeks.  -HBV serologies and Quantiferon Gold are negative. Will plan on starting Remicade + immunomodulator for "top down" therapy as an outpatient.  - Again stressed the importance of smoking cessation for overall health as well as promote healing fistulae/IBD  2. Elevated BP, takes Norvasc  3. Tobacco use. Getting Nicoderm.   OBJECTIVE:     Vital signs in last 24 hours: Temp:  [97.9 F (36.6 C)-98.3 F (36.8 C)] 98 F (36.7 C) (08/28 0604) Pulse Rate:  [67-94] 67 (08/28 0604) Resp:  [16-18] 16 (08/28 0604) BP: (123-167)/(81-104) 155/104 (08/28 0913) SpO2:  [96 %-99 %] 96 % (08/28 0604) Last BM Date: 03/13/18 General: white female in NAD Heart: Regular rate and rhythm; no murmurs Lungs: Respirations even and unlabored, lungs CTA bilaterally Abdomen: Soft, nontender, no rebound  or guarding. Nondistended. Normal bowel sounds.Drain in LLQ with minimal output Extremities: Without edema. Neurologic: Alert and oriented, grossly normal neurologically. Psych: Cooperative. Normal mood and affect.   Intake/Output from previous day: 08/27 0701 - 08/28 0700 In: 1254.5 [P.O.:920; I.V.:229.5; IV Piggyback:100] Out: 3450 [Urine:3450] Intake/Output this shift: Total I/O In: -  Out: 200 [Urine:200]  Lab Results: Recent Labs    03/12/18 0417 03/13/18 0515 03/14/18 0523  WBC 9.9 7.0 9.0  HGB 10.7* 11.1* 11.5*  HCT 32.6* 33.8* 35.2*  PLT 277 313 310   BMET Recent Labs    03/12/18 0417 03/13/18 0515 03/14/18 0523  NA 139 141 138  K 4.5 4.6 4.1  CL 105 105 102  CO2 27 29 30   GLUCOSE 126* 131* 142*  BUN <5* 5* 10  CREATININE 0.49 0.54 0.58  CALCIUM 8.5* 8.9 9.1   LFT No results for input(s): PROT, ALBUMIN, AST, ALT, ALKPHOS, BILITOT, BILIDIR, IBILI in the last 72 hours. PT/INR No results for input(s): LABPROT, INR in the last 72 hours. Hepatitis Panel Recent Labs    03/11/18 1008  HEPBSAG Negative    No results found.   Discharge Planning Diet: Low residue diet Anticoagulation and antiplatelets:N/A Discharge Medications: Prednisone 60 mg x2 weeks then taper; PO antimicrobial therapy per primary service when transitioned from IV Abx today Follow up: To f/u with [name] on 9/12 at 10:30  Procedure Procedures planned and timing of procedure: Colonoscopy in approx 6 weeks     Principal Problem:   Intra-abdominal abscess ([place]) Active Problems:   Tobacco abuse  COPD (chronic obstructive pulmonary disease) ([place])   Sepsis ([place])   Enteroenteric fistula     LOS: 5 days   Tye Savoy ,NP 03/14/2018, 9:30 AM

## 2018-03-14 NOTE — Progress Notes (Signed)
[facility] Progress Note     Subjective: CC-  Walking around room. No complaints this morning. Denies abdominal pain, nausea, vomiting. Tolerating soft diet. States that she had a few soft BMs yesterday.  Hopes to go home soon.  Objective: Vital signs in last 24 hours: Temp:  [97.9 F (36.6 C)-98.3 F (36.8 C)] 98 F (36.7 C) (08/28 0604) Pulse Rate:  [67-94] 67 (08/28 0604) Resp:  [16-18] 16 (08/28 0604) BP: (123-167)/(81-104) 151/93 (08/28 0604) SpO2:  [96 %-99 %] 96 % (08/28 0604) Last BM Date: 03/13/18  Intake/Output from previous day: 08/27 0701 - 08/28 0700 In: 1254.5 [P.O.:920; I.V.:229.5; IV Piggyback:100] Out: 3450 [Urine:3450] Intake/Output this shift: Total I/O In: -  Out: 200 [Urine:200]  PE: Gen:  Alert, NAD, pleasant HEENT: EOM's intact, pupils equal and round Card:  RRR Pulm:  CTAB, no W/R/R, effort normal Abd: Soft, NT/ND, +BS, drain with trace feculent fluid in bulb Ext:  Calves soft and nontender Psych: A&Ox3  Skin: no rashes noted, warm and dry  Lab Results:  Recent Labs    03/13/18 0515 03/14/18 0523  WBC 7.0 9.0  HGB 11.1* 11.5*  HCT 33.8* 35.2*  PLT 313 310   BMET Recent Labs    03/13/18 0515 03/14/18 0523  NA 141 138  K 4.6 4.1  CL 105 102  CO2 29 30  GLUCOSE 131* 142*  BUN 5* 10  CREATININE 0.54 0.58  CALCIUM 8.9 9.1   PT/INR No results for input(s): LABPROT, INR in the last 72 hours. CMP     Component Value Date/Time   NA 138 03/14/2018 0523   NA 145 03/19/2014 1829   K 4.1 03/14/2018 0523   K 3.4 (L) 03/19/2014 1829   CL 102 03/14/2018 0523   CL 112 (H) 03/19/2014 1829   CO2 30 03/14/2018 0523   CO2 27 03/19/2014 1829   GLUCOSE 142 (H) 03/14/2018 0523   GLUCOSE 92 03/19/2014 1829   BUN 10 03/14/2018 0523   BUN 8 03/19/2014 1829   CREATININE 0.58 03/14/2018 0523   CREATININE 0.70 03/19/2014 1829   CREATININE 0.67 06/02/2011 1604   CALCIUM 9.1 03/14/2018 0523   CALCIUM 8.4 (L) 03/19/2014 1829    PROT 6.5 03/09/2018 1315   PROT 7.4 03/19/2014 1829   ALBUMIN 3.1 (L) 03/09/2018 1315   ALBUMIN 3.5 03/19/2014 1829   AST 25 03/09/2018 1315   AST 28 03/19/2014 1829   ALT 17 03/09/2018 1315   ALT 14 03/19/2014 1829   ALKPHOS 45 03/09/2018 1315   ALKPHOS 58 03/19/2014 1829   BILITOT 0.7 03/09/2018 1315   BILITOT 0.2 03/19/2014 1829   GFRNONAA >60 03/14/2018 0523   GFRNONAA >60 03/19/2014 1829   GFRAA >60 03/14/2018 0523   GFRAA >60 03/19/2014 1829   Lipase     Component Value Date/Time   LIPASE 26 03/09/2018 1315       Studies/Results: No results found.  Anti-infectives: Anti-infectives (From admission, onward)   Start     Dose/Rate Route Frequency Ordered Stop   03/12/18 1500  cefTRIAXone (ROCEPHIN) 2 g in sodium chloride 0.9 % 100 mL IVPB     2 g 200 mL/hr over 30 Minutes Intravenous Every 24 hours 03/12/18 1430     03/09/18 2200  piperacillin-tazobactam (ZOSYN) IVPB 3.375 g  Status:  Discontinued     3.375 g 12.5 mL/hr over 240 Minutes Intravenous Every 8 hours 03/09/18 1627 03/12/18 1430   03/09/18 1330  piperacillin-tazobactam (ZOSYN) IVPB 3.375 g  3.375 g 100 mL/hr over 30 Minutes Intravenous  Once 03/09/18 1322 03/09/18 1516       Assessment/Plan HTN COPD MS GERD Tobacco abuse  Intra-abdominal abscess, possible inflammatory bowel disease - s/p IR drain placement 8/24, cx grew e coli - drain output low volume (0cc in 24hr) - switched to oral steroids 8/27 with plans to taper over the next few weeks, GI planning outpatient colonoscopy in approximately 6 weeks  ID - rocephin 8/26>>, zosyn 8/23>>8/26 FEN - soft diet VTE - SCDs, lovenox Foley - none  Plan - Drain output low, will discuss timing of repeat imaging and possible drain removal with IR. Steroids per GI.  Switching to oral abx today.   LOS: 5 days    Wellington Hampshire , [facility] 03/14/2018, 8:23 AM Pager: [phone] Consults: [phone] Mon 7:00 am  -11:30 AM Tues-Fri 7:00 am-4:30 pm Sat-Sun 7:00 am-11:30 am

## 2018-03-14 NOTE — Progress Notes (Signed)
[facility] PROGRESS NOTE    Progress Note  Christina Miller  ZOX:[id number] DOB: 1966-02-02 DOA: 03/09/2018 PCP: Patient, No Pcp Per     Brief Narrative:   Christina Miller is an 52 y.o. 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  Scented to the ED on the day of admission with abdominal pain a CT scan was done that showed worsening fistula and possible pelvic abscess GI was consulted  Assessment/Plan:   Sepsis likely due to to enteritis and intra-abdominal abscess : Suspicious for cone but not confirm. Culture data has remained negative. GI on board and planning for colonoscopy in 6 weeks post discharge. General surgery was consulted recommended to consult IR for abscess drainage, which was performed on 03/10/2018. Gram stain showed gram-positive cocci and moderate E. coli sensitive to Rocephin. Antibiotic coverage was de-escalated to IV Rocephin  to continue oral prednisone.  Essential hypertension: Continue IV hydralazine as needed and amlodipine daily.  COPD stable.   Tobacco abuse   DVT prophylaxis: lovenox Family Communication:None Disposition Plan/Barrier to D/C: home in 1-2 days Code Status:     Code Status Orders  (From admission, onward)         Start     Ordered   03/09/18 1538  Full code  Continuous     03/09/18 1538        Code Status History    Date Active Date Inactive Code Status Order ID Comments User Context   06/30/2013 0052 06/30/2013 1016 Full Code [id number]  Elwyn Lade, PA-C ED        IV Access:    Peripheral IV   Procedures and diagnostic studies:   No results found.   Medical Consultants:    None.  Anti-Infectives:   IV  rocephin  Subjective:    Christina Miller feels great, no complains  Objective:    Vitals:   03/13/18 2126 03/14/18 0604 03/14/18 0913 03/14/18 1047  BP: 130/88 (!) 151/93 (!) 155/104 (!) 146/92  Pulse: 78 67    Resp: 16 16    Temp: 98.1 F (36.7 C) 98 F (36.7 C)    TempSrc: Oral Oral    SpO2: 99% 96%    Weight:      Height:        Intake/Output Summary (Last 24 hours) at 03/14/2018 1058 Last data filed at 03/14/2018 1048 Gross per 24 hour  Intake 934.54 ml  Output 4100 ml  Net -3165.46 ml   Filed Weights   03/09/18 1353  Weight: 51.3 kg    Exam: General exam: In no acute distress. Respiratory system: Good air movement and clear to auscultation. Cardiovascular system: S1 & S2 heard, RRR.   Gastrointestinal system: Abdomen is nondistended, soft and nontender.  Central nervous system: Alert and oriented. No focal neurological deficits. Extremities: No pedal edema. Skin: No rashes, lesions or ulcers Psychiatry: Judgement and insight appear normal. Mood & affect appropriate.    Data Reviewed:    Labs: Basic Metabolic Panel: Recent Labs  Lab 03/10/18 0513 03/11/18 0450 03/12/18 0417 03/13/18 0515 03/14/18 0523  NA 139 137 139 141 138  K 4.5 4.5 4.5 4.6 4.1  CL 106 107 105 105 102  CO2 28  25 27 29 30   GLUCOSE 132* 196* 126* 131* 142*  BUN 6 7 <5* 5* 10  CREATININE 0.45 0.47 0.49 0.54 0.58  CALCIUM 8.1* 8.2* 8.5* 8.9 9.1   GFR Estimated Creatinine Clearance: 64.3 mL/min (by C-G formula based on SCr of 0.58 mg/dL). Liver Function Tests: Recent Labs  Lab 03/09/18 1315  AST 25  ALT 17  ALKPHOS 45  BILITOT 0.7  PROT 6.5  ALBUMIN 3.1*   Recent Labs  Lab 03/09/18 1315  LIPASE 26   No results for input(s): AMMONIA in the last 168 hours. Coagulation profile Recent Labs  Lab 03/10/18 0620  INR 0.96    CBC: Recent Labs  Lab 03/09/18 1315 03/10/18 0513 03/11/18 0450 03/12/18 0417 03/13/18 0515 03/14/18 0523  WBC 27.1* 13.9* 12.6* 9.9  7.0 9.0  NEUTROABS 23.5*  --  11.7* 8.6* 5.5 7.2  HGB 12.7 10.8* 10.2* 10.7* 11.1* 11.5*  HCT 38.7 32.9* 32.3* 32.6* 33.8* 35.2*  MCV 97.2 96.8 100.0 95.9 97.7 97.0  PLT 379 320 282 277 313 310   Cardiac Enzymes: No results for input(s): CKTOTAL, CKMB, CKMBINDEX, TROPONINI in the last 168 hours. BNP (last 3 results) No results for input(s): PROBNP in the last 8760 hours. CBG: Recent Labs  Lab 03/13/18 0722 03/13/18 1158 03/13/18 1615 03/13/18 2127 03/14/18 0751  GLUCAP 106* 105* 111* 131* 142*   D-Dimer: No results for input(s): DDIMER in the last 72 hours. Hgb A1c: No results for input(s): HGBA1C in the last 72 hours. Lipid Profile: No results for input(s): CHOL, HDL, LDLCALC, TRIG, CHOLHDL, LDLDIRECT in the last 72 hours. Thyroid function studies: No results for input(s): TSH, T4TOTAL, T3FREE, THYROIDAB in the last 72 hours.  Invalid input(s): FREET3 Anemia work up: No results for input(s): VITAMINB12, FOLATE, FERRITIN, TIBC, IRON, RETICCTPCT in the last 72 hours. Sepsis Labs: Recent Labs  Lab 03/09/18 1345  03/11/18 0450 03/12/18 0417 03/13/18 0515 03/14/18 0523  WBC  --    < > 12.6* 9.9 7.0 9.0  LATICACIDVEN 1.73  --   --   --   --   --    < > = values in this interval not displayed.   Microbiology Recent Results (from the past 240 hour(s))  Blood culture (routine x 2)     Status: None   Collection Time: 03/09/18  1:15 PM  Result Value Ref Range Status   Specimen Description   Final    BLOOD RIGHT HAND Performed at [facility] [address], [place], [place] [address]    Special Requests   Final    BOTTLES DRAWN AEROBIC AND ANAEROBIC Blood Culture adequate volume Performed at [facility] [address] [place], [place] [address]    Culture   Final    NO GROWTH 5 DAYS Performed at [facility], [place] [address] [place], [place] [address]    Report Status 03/14/2018 FINAL  Final  Blood culture (routine x  2)     Status: None   Collection Time: 03/09/18  1:46 PM  Result Value Ref Range Status   Specimen Description   Final    BLOOD RIGHT HAND Performed at [facility] [address], [place], [place] [address]    Special Requests   Final    BOTTLES DRAWN AEROBIC AND ANAEROBIC Blood Culture adequate volume Performed at [facility] [address] [place], [place] [address]    Culture   Final  NO GROWTH 5 DAYS Performed at [facility], [place] [address], [place], [place] [address]    Report Status 03/14/2018 FINAL  Final  Aerobic/Anaerobic Culture (surgical/deep wound)     Status: None (Preliminary result)   Collection Time: 03/10/18 12:24 PM  Result Value Ref Range Status   Specimen Description   Final    PELVIS ABSCESS Performed at [facility] [address] [place], [place] [address]    Special Requests   Final    NONE Performed at [facility], [place] [address], [place], [place] [address]    Gram Stain   Final    FEW WBC PRESENT,BOTH PMN AND MONONUCLEAR RARE GRAM POSITIVE COCCI IN PAIRS RARE GRAM VARIABLE ROD    Culture   Final    MODERATE ESCHERICHIA COLI MODERATE BIFIDOBACTERIUM SPECIES BETA LACTAMASE POSITIVE Performed at [facility], [place] [address], [place], [place] [address]    Report Status PENDING  Incomplete   Organism ID, Bacteria ESCHERICHIA COLI  Final      Susceptibility   Escherichia coli - MIC*    AMPICILLIN >=32 RESISTANT Resistant     CEFAZOLIN 8 SENSITIVE Sensitive     CEFEPIME <=1 SENSITIVE Sensitive     CEFTAZIDIME <=1 SENSITIVE Sensitive     CEFTRIAXONE <=1 SENSITIVE Sensitive     CIPROFLOXACIN >=4 RESISTANT Resistant     GENTAMICIN <=1 SENSITIVE Sensitive     IMIPENEM <=0.25 SENSITIVE Sensitive     TRIMETH/SULFA <=20 SENSITIVE Sensitive     AMPICILLIN/SULBACTAM >=32 RESISTANT Resistant     PIP/TAZO 8 SENSITIVE Sensitive     Extended ESBL NEGATIVE  Sensitive     * MODERATE ESCHERICHIA COLI     Medications:   . amLODipine  5 mg Oral Daily  . enoxaparin (LOVENOX) injection  40 mg Subcutaneous Q24H  . famotidine  20 mg Oral BID  . nicotine  21 mg Transdermal Daily  . predniSONE  60 mg Oral Q breakfast  . sodium chloride flush  5 mL Intracatheter Q8H   Continuous Infusions: . sodium chloride 10 mL/hr at 03/13/18 1501  . cefTRIAXone (ROCEPHIN)  IV 2 g (03/13/18 1504)      LOS: 5 days   [facility] Pager [phone]  *Please refer to [email or web address], password [email or web address] to get updated schedule on who will round on this patient, as hospitalists switch teams weekly. If 7PM-7AM, please contact night-coverage at [email or web address], password [email or web address] for any overnight needs.  03/14/2018, 10:58 AM

## 2018-03-14 NOTE — Care Management Note (Signed)
Case Management Note  Patient Details  Name: [name] MRN: [id number] Date of Birth: 1965-08-22  Subjective/Objective: Patient has no health insurance-already has an appt @ [facility]-will check on the time w/[facility] liason Jane. Intra-abdominal abscess-JP drain now to gravity.IV abx.CT abld-Persistent fluid,possible fistula. Patient very emotional about these new findings-wants to smoke-encouraged her to stay in hospital & receive best care so she can go home as healthy as possible-Nsg notified.CM would encourage staff to teach the patient as much as possible in hospital about drain care since she doesn't have health insurance & the cost for HHRN-estimate $150 a visit.                 Action/Plan:d/c plan home.   Expected Discharge Date:                  Expected Discharge Plan:  Home/Self Care  In-House Referral:     Discharge planning Services  CM Consult, [facility]  Post Acute Care Choice:    Choice offered to:     DME Arranged:    DME Agency:     HH Arranged:    HH Agency:     Status of Service:  In process, will continue to follow  If discussed at Long Length of Stay Meetings, dates discussed:    Additional Comments:  Dessa Phi, RN 03/14/2018, 1:52 PM

## 2018-03-14 NOTE — Progress Notes (Signed)
Referring Physician(s): Brigid Re, PA-C  Supervising Physician: Daryll Brod  Patient Status:  [name] - In-pt  Chief Complaint: Follow up pelvic abscess drain placed 8/24 by Dr. Anselm Pancoast  Subjective: Christina Miller a 52 y.o.femalewith a past medical history of COPD, HTN, MS, GERD and anemia presented to [facility] ED on 8/23 due to abnormal outpatient CT scan - she is followed by [name] of [facility]. She endorsed abdominal pain/cramping, n/v and diarrhea for over 1 month. She was seen at [facility] on 02/15/18 for same; pelvic US performed which showed moderate free peritoneal fluid. She presented to [facility] ED on 8/9 for above symptoms and CT showed small bowel inflammation with mural thickening, inflammation of adjacent mesentery and evidence of enteroenteric/enterocolic fistulas - d/ced from ED on same day with steroid taper. She initially felt better after medications however on the morning of 8/23, while drinking PO contrast for outpatient CT, she began to have severe abdominal cramps with subjective fever and chills. Outpatient CT done on 8/23 showed, "extensive areas of mural thickening involving mid to distal small bowel, as well as the sigmoid colon. In the low anatomic pelvis there are worsening areas which appear to reflect enteroenteric and/or enterocolonic fistulae, as well as a developing abscess in the low left hemipelvis."   She was admitted for sepsis evaluation and treatment. IR was consulted for pelvic abscess drain which was performed without complication on 6/25 by Dr. Anselm Pancoast. She is being followed by GI who has started PO steroids, IV abx with plans to transition to PO today and planned for outpatient colonoscopy. She is also followed by surgery who recommends not surgical intervention at this time.  Patient reports she feels great, has been eating "cheeseburgers and cookies" without issue. She states her stools remain soft but are more firm. Denies pain anywhere.    Allergies: Hydrocodone-acetaminophen  Medications: Prior to Admission medications   Medication Sig Start Date End Date Taking? Authorizing Provider  ciprofloxacin (CIPRO) 500 MG tablet Take 1 tablet (500 mg total) by mouth 2 (two) times daily. 03/05/18  Yes Nandigam, Venia Minks, MD  ibuprofen (ADVIL,MOTRIN) 200 MG tablet Take 800 mg by mouth every 6 (six) hours as needed for moderate pain.   Yes [provider]  metroNIDAZOLE (FLAGYL) 500 MG tablet Take 1 tablet (500 mg total) by mouth 2 (two) times daily. 03/05/18  Yes Nandigam, Venia Minks, MD  predniSONE (DELTASONE) 10 MG tablet 40 mg daily x 6 days, 30 mg daily x 7 days, 20 mg daily x 7 days then 10 mg daily x 7 days Patient taking differently: Take 10-40 mg by mouth See admin instructions. Take 40 mg daily x 6 days, 30 mg daily x 7 days, 20 mg daily x 7 days then 10 mg daily x 7 days 02/24/18  Yes Sherwood Gambler, MD     Vital Signs: BP (!) 155/104   Pulse 67   Temp 98 F (36.7 C) (Oral)   Resp 16   Ht 5' 2"  (1.575 m)   Wt 113 lb (51.3 kg)   LMP 06/20/2012   SpO2 96%   BMI 20.67 kg/m   Physical Exam  Constitutional: She is oriented to person, place, and time. No distress.  HENT:  Head: Normocephalic.  Cardiovascular: Normal rate, regular rhythm and normal heart sounds.  Pulmonary/Chest: Effort normal and breath sounds normal.  Abdominal: Soft. Bowel sounds are normal. She exhibits no distension. There is no tenderness.  Left abdominal drain in place; insertion  site unremarkable. Scant light brown output in JP.  Neurological: She is alert and oriented to person, place, and time.  Skin: Skin is warm and dry. No rash noted. She is not diaphoretic.  Nursing note and vitals reviewed.   Imaging: Ct Image Guided Drainage By Percutaneous Catheter  Result Date: 03/10/2018 INDICATION: 52 year old with an abdominopelvic abscess. Concern for bowel fistula on previous imaging. EXAM: CT GUIDED DRAINAGE OF ABDOMINOPELVIC  ABSCESS MEDICATIONS: The patient is currently admitted to the hospital and receiving intravenous antibiotics. ANESTHESIA/SEDATION: 4.0 mg IV Versed 100 mcg IV Fentanyl Moderate Sedation Time:  39 minutes The patient was continuously monitored during the procedure by the interventional radiology nurse under my direct supervision. COMPLICATIONS: None immediate. TECHNIQUE: Informed written consent was obtained from the patient after a thorough discussion of the procedural risks, benefits and alternatives. All questions were addressed. A timeout was performed prior to the initiation of the procedure. PROCEDURE: Patient was placed supine on the CT scanner. Images through the lower abdomen and pelvis were obtained. The area of concern in the left pelvic region was identified. The overlying skin was prepped and draped in sterile fashion. Skin and soft tissues were anesthetized with 1% lidocaine. 82 gauge trocar needle was directed into the abscess with CT guidance. Thick purulent fluid was aspirated from the needle. A stiff Amplatz wire was advanced into the collection. The tract was dilated to accommodate a 10 [place] multipurpose drain. Approximately 5 mL of thick purulent fluid was removed from the collection. Drain was sutured to skin. FINDINGS: Again noted is an abscess collection in the left lower abdomen and pelvic region. There is now oral contrast within the abscess and there is concern for a fistula connection between small bowel and sigmoid colon on sequence 2, image 28. Thick purulent fluid was removed from the collection. Drain position confirmed within the collection. IMPRESSION: CT-guided placement of a drainage catheter within the abdominopelvic abscess collection. CT images raise concern for fistula connections between small bowel, sigmoid colon and the abscess. Electronically Signed   By: Markus Daft M.D.   On: 03/10/2018 13:11    Labs:  CBC: Recent Labs    03/11/18 0450 03/12/18 0417  03/13/18 0515 03/14/18 0523  WBC 12.6* 9.9 7.0 9.0  HGB 10.2* 10.7* 11.1* 11.5*  HCT 32.3* 32.6* 33.8* 35.2*  PLT 282 277 313 310    COAGS: Recent Labs    03/10/18 0620  INR 0.96  APTT 26    BMP: Recent Labs    03/11/18 0450 03/12/18 0417 03/13/18 0515 03/14/18 0523  NA 137 139 141 138  K 4.5 4.5 4.6 4.1  CL 107 105 105 102  CO2 25 27 29 30   GLUCOSE 196* 126* 131* 142*  BUN 7 <5* 5* 10  CALCIUM 8.2* 8.5* 8.9 9.1  CREATININE 0.47 0.49 0.54 0.58  GFRNONAA >60 >60 >60 >60  GFRAA >60 >60 >60 >60    LIVER FUNCTION TESTS: Recent Labs    02/15/18 1402 02/23/18 1646 03/05/18 1446 03/09/18 1315  BILITOT 0.9 0.7 0.6 0.7  AST 24 16 23 25   ALT 13 10 20 17   ALKPHOS 70 55 62 45  PROT 7.4 7.2 7.1 6.5  ALBUMIN 3.3* 2.9* 3.6 3.1*    Assessment and Plan:  Pelvic abscess with aspiration and drainage performed in IR by Dr. Anselm Pancoast on 8/24 - scant output from drain over previous 24 hours. Patient much improved overall, has begun PO steroids and plans to transition to PO abx today per  GI. She is planned for outpatient colonoscopy in ~6 weeks. WBC normal, afebrile, total 5 cc output from JP yesterday and day before. Will order CT abdomen/pelvis with IV contrast today for evaluation of pelvic abscess.   Electronically Signed: Joaquim Nam, PA-C 03/14/2018, 9:59 AM   I spent a total of 25 Minutes at the the patient's bedside AND on the patient's hospital floor or unit, greater than 50% of which was counseling/coordinating care for pelvic abscess drain.

## 2018-03-15 DIAGNOSIS — K651 Peritoneal abscess: Secondary | ICD-10-CM

## 2018-03-15 DIAGNOSIS — N739 Female pelvic inflammatory disease, unspecified: Secondary | ICD-10-CM

## 2018-03-15 DIAGNOSIS — A4151 Sepsis due to Escherichia coli [E. coli]: Principal | ICD-10-CM

## 2018-03-15 LAB — AEROBIC/ANAEROBIC CULTURE W GRAM STAIN (SURGICAL/DEEP WOUND)

## 2018-03-15 LAB — BASIC METABOLIC PANEL
ANION GAP: 7 (ref 5–15)
BUN: 11 mg/dL (ref 6–20)
CALCIUM: 8.5 mg/dL — AB (ref 8.9–10.3)
CO2: 29 mmol/L (ref 22–32)
CREATININE: 0.55 mg/dL (ref 0.44–1.00)
Chloride: 105 mmol/L (ref 98–111)
GFR calc non Af Amer: >60 mL/min (ref >60)
Glucose, Bld: 109 mg/dL — ABNORMAL HIGH (ref 70–99)
Potassium: 4 mmol/L (ref 3.5–5.1)
SODIUM: 141 mmol/L (ref 135–145)

## 2018-03-15 LAB — CBC WITH DIFFERENTIAL/PLATELET
BASOS ABS: 0 10*3/uL (ref 0.0–0.1)
BASOS PCT: 0 %
EOS ABS: 0.1 10*3/uL (ref 0.0–0.7)
Eosinophils Relative: 1 %
HCT: 34.9 % — ABNORMAL LOW (ref 36.0–46.0)
HEMOGLOBIN: 11.1 g/dL — AB (ref 12.0–15.0)
LYMPHS PCT: 34 %
Lymphs Abs: 3.2 10*3/uL (ref 0.7–4.0)
MCH: 31.4 pg (ref 26.0–34.0)
MCHC: 31.8 g/dL (ref 30.0–36.0)
MCV: 98.6 fL (ref 78.0–100.0)
MONOS PCT: 11 %
Monocytes Absolute: 1.1 10*3/uL — ABNORMAL HIGH (ref 0.1–1.0)
NEUTROS ABS: 5 10*3/uL (ref 1.7–7.7)
NEUTROS PCT: 54 %
Platelets: 275 10*3/uL (ref 150–400)
RBC: 3.54 MIL/uL — ABNORMAL LOW (ref 3.87–5.11)
RDW: 16 % — ABNORMAL HIGH (ref 11.5–15.5)
WBC: 9.4 10*3/uL (ref 4.0–10.5)

## 2018-03-15 LAB — THIOPURINE METHYLTRANSFERASE (TPMT), RBC: TPMT ACTIVITY: 24.3 U/mL{RBCs}

## 2018-03-15 LAB — GLUCOSE, CAPILLARY
GLUCOSE-CAPILLARY: 73 mg/dL (ref 70–99)
Glucose-Capillary: 69 mg/dL — ABNORMAL LOW (ref 70–99)
Glucose-Capillary: 132 mg/dL — ABNORMAL HIGH (ref 70–99)

## 2018-03-15 LAB — AEROBIC/ANAEROBIC CULTURE (SURGICAL/DEEP WOUND)

## 2018-03-15 MED ORDER — NICOTINE 21 MG/24HR TD PT24: 21.0000 mg | MEDICATED_PATCH | Freq: Every day | TRANSDERMAL | 0 refills | Status: DC

## 2018-03-15 MED ORDER — AMLODIPINE BESYLATE 5 MG PO TABS: 5.0000 mg | ORAL_TABLET | Freq: Every day | ORAL | 0 refills | Status: DC

## 2018-03-15 MED ORDER — FAMOTIDINE 20 MG PO TABS: 20.0000 mg | ORAL_TABLET | Freq: Two times a day (BID) | ORAL | 0 refills | Status: DC

## 2018-03-15 MED ORDER — SULFAMETHOXAZOLE-TRIMETHOPRIM 800-160 MG PO TABS
1.0000 | ORAL_TABLET | Freq: Two times a day (BID) | ORAL | Status: DC
  Administered 2018-03-15: 1 via ORAL
  Filled 2018-03-15: qty 1

## 2018-03-15 MED ORDER — PREDNISONE 20 MG PO TABS: 40.0000 mg | ORAL_TABLET | Freq: Every day | ORAL | 0 refills | Status: DC

## 2018-03-15 MED ORDER — PREDNISONE 20 MG PO TABS: 60.0000 mg | ORAL_TABLET | Freq: Every day | ORAL | 0 refills | Status: DC

## 2018-03-15 MED ORDER — SULFAMETHOXAZOLE-TRIMETHOPRIM 800-160 MG PO TABS: 1.0000 | ORAL_TABLET | Freq: Two times a day (BID) | ORAL | 0 refills | Status: DC

## 2018-03-15 NOTE — Progress Notes (Signed)
Discharge, drain care and and medication instructions reviewed with patient. Questions answered and patient denies further questions. Six prescriptions and supplies given to patient. Family member is here to drive patient home. Donne Hazel, RN

## 2018-03-15 NOTE — Progress Notes (Signed)
[facility] Progress Note     Subjective: CC-  No new complaints. Drain site a little sore but otherwise she denies any pain. No n/v. Tolerating diet and having bowel movements. 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 Drain remains and JP was switched to gravity bag.  Objective: Vital signs in last 24 hours: Temp:  [98 F (36.7 C)-98.3 F (36.8 C)] 98.3 F (36.8 C) (08/29 0553) Pulse Rate:  [83-86] 83 (08/29 0553) Resp:  [16-18] 16 (08/29 0553) BP: (131-155)/(87-104) 131/87 (08/29 0553) SpO2:  [97 %-99 %] 99 % (08/29 0553) Last BM Date: 03/14/18  Intake/Output from previous day: 08/28 0701 - 08/29 0700 In: 1325 [P.O.:1210; I.V.:10; IV Piggyback:100] Out: 3400 [Urine:3400] Intake/Output this shift: No intake/output data recorded.  PE: Gen: Alert, NAD, pleasant HEENT: EOM's intact, pupils equal and round Card: RRR Pulm: CTAB, no W/R/R, effort normal Abd: Soft, NT/ND, +BS,drain with trace feculent fluid in bag HYW:VPXTGG soft and nontender Psych: A&Ox3  Skin: no rashes noted, warm and dry  Lab Results:  Recent Labs    03/14/18 0523 03/15/18 0356  WBC 9.0 9.4  HGB 11.5* 11.1*  HCT 35.2* 34.9*  PLT 310 275   BMET Recent Labs    03/14/18 0523 03/15/18 0356  NA 138 141  K 4.1 4.0  CL 102 105  CO2 30 29  GLUCOSE 142* 109*  BUN 10 11  CREATININE 0.58 0.55  CALCIUM 9.1 8.5*   PT/INR No results for input(s): LABPROT, INR in the last 72 hours. CMP     Component Value Date/Time   NA 141 03/15/2018 0356   NA 145 03/19/2014 1829   K 4.0 03/15/2018 0356   K 3.4 (L) 03/19/2014 1829   CL 105 03/15/2018 0356   CL 112 (H) 03/19/2014 1829   CO2 29 03/15/2018 0356   CO2 27 03/19/2014 1829   GLUCOSE 109 (H) 03/15/2018 0356   GLUCOSE 92 03/19/2014 1829   BUN 11 03/15/2018 0356   BUN 8 03/19/2014 1829   CREATININE 0.55  03/15/2018 0356   CREATININE 0.70 03/19/2014 1829   CREATININE 0.67 06/02/2011 1604   CALCIUM 8.5 (L) 03/15/2018 0356   CALCIUM 8.4 (L) 03/19/2014 1829   PROT 6.5 03/09/2018 1315   PROT 7.4 03/19/2014 1829   ALBUMIN 3.1 (L) 03/09/2018 1315   ALBUMIN 3.5 03/19/2014 1829   AST 25 03/09/2018 1315   AST 28 03/19/2014 1829   ALT 17 03/09/2018 1315   ALT 14 03/19/2014 1829   ALKPHOS 45 03/09/2018 1315   ALKPHOS 58 03/19/2014 1829   BILITOT 0.7 03/09/2018 1315   BILITOT 0.2 03/19/2014 1829   GFRNONAA >60 03/15/2018 0356   GFRNONAA >60 03/19/2014 1829   GFRAA >60 03/15/2018 0356   GFRAA >60 03/19/2014 1829   Lipase     Component Value Date/Time   LIPASE 26 03/09/2018 1315       Studies/Results: Ct Abdomen Pelvis W Contrast  Result Date: 03/14/2018 CLINICAL DATA:  Pelvic abscess, status post percutaneous drain 03/10/2018 EXAM: CT ABDOMEN AND PELVIS WITH CONTRAST TECHNIQUE: Multidetector CT imaging of the abdomen and pelvis was performed using the standard protocol following bolus administration of intravenous contrast. CONTRAST:  7m OMNIPAQUE IOHEXOL 300 MG/ML  SOLN COMPARISON:  03/10/2018 FINDINGS: Lower chest: No acute abnormality. Hepatobiliary: No focal liver abnormality is seen. No gallstones, gallbladder wall thickening, or biliary dilatation. Pancreas: Unremarkable. No pancreatic ductal dilatation  or surrounding inflammatory changes. Spleen: Normal in size without focal abnormality. Adrenals/Urinary Tract: Adrenal glands are unremarkable. Kidneys are normal, without renal calculi, focal lesion, or hydronephrosis. Bladder is unremarkable. Stomach/Bowel: Negative for bowel obstruction, significant dilatation, ileus, or free air. Left lower quadrant anterior pelvic drain catheter is stable in position. There is a persistent fluid collection at the drain catheter site containing enteric contrast. This collection measures 5.5 x 2.4 cm, image 58 series 2. 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 Adjacent wall thickening persist of the sigmoid colon. Diverticular disease also evident. No new fluid collections within the abdomen or pelvis. No free fluid or ascites. Vascular/Lymphatic: Aortic atherosclerosis noted. No aneurysm or dissection. Mesenteric and renal vasculature appear patent. Iliac vessels are patent. No adenopathy. Reproductive: Uterus and adnexa normal in size. Other: No abdominal wall hernia or abnormality. No abdominopelvic ascites. Musculoskeletal: Minor lumbar spine degenerative changes. No acute osseous finding. IMPRESSION: Interval placement of a anterior left pelvic drain catheter. 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 Minimal improvement in the pelvic inflammation. Wall thickening persist of the sigmoid colon with diverticular disease noted. Appearance suggest some improvement in the acute diverticulitis/colitis. No new abdominal or pelvic fluid collections. Negative for obstruction, ileus or free air. Electronically Signed   By: Jerilynn Mages.  Shick M.D.   On: 03/14/2018 13:20    Anti-infectives: Anti-infectives (From admission, onward)   Start     Dose/Rate Route Frequency Ordered Stop   03/12/18 1500  cefTRIAXone (ROCEPHIN) 2 g in sodium chloride 0.9 % 100 mL IVPB     2 g 200 mL/hr over 30 Minutes Intravenous Every 24 hours 03/12/18 1430     03/09/18 2200  piperacillin-tazobactam (ZOSYN) IVPB 3.375 g  Status:  Discontinued     3.375 g 12.5 mL/hr over 240 Minutes Intravenous Every 8 hours 03/09/18 1627 03/12/18 1430   03/09/18 1330  piperacillin-tazobactam (ZOSYN) IVPB 3.375 g     3.375 g 100 mL/hr over 30 Minutes Intravenous  Once 03/09/18 1322 03/09/18 1516       Assessment/Plan HTN COPD MS GERD Tobacco abuse  Intra-abdominal abscess, possible inflammatory bowel disease - s/p IR drain  placement 8/24, cx grew e coli - drain output low volume (0cc in 24hr) - CT 8/28 showed persistent fluid collection at drain site containing enteric contrast with 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    LOS: 6 days    Wellington Hampshire , [facility] 03/15/2018, 7:45 AM Pager: [phone] Consults: [phone] Mon 7:00 am -11:30 AM Tues-Fri 7:00 am-4:30 pm Sat-Sun 7:00 am-11:30 am

## 2018-03-15 NOTE — Care Management Note (Signed)
Case Management Note  Patient Details  Name: Christina Miller MRN: [id number] Date of Birth: April 21, 1966  Subjective/Objective: Established @ [place] has pcp f/u appt,can get meds there. Has family support. No CM needs or orders.                   Action/Plan:d/c home.   Expected Discharge Date:  03/15/18               Expected Discharge Plan:  Home/Self Care  In-House Referral:     Discharge planning Services  CM Consult, [facility]  Post Acute Care Choice:    Choice offered to:     DME Arranged:    DME Agency:     HH Arranged:    HH Agency:     Status of Service:  Completed, signed off  If discussed at [employer], dates discussed:    Additional Comments:  Dessa Phi, RN 03/15/2018, 12:04 PM

## 2018-03-15 NOTE — Discharge Summary (Addendum)
Physician Discharge Summary  Christina Miller XIH:[id number] DOB: 21-Oct-1965 DOA: 03/09/2018  PCP: Patient, No Pcp Per  Admit date: 03/09/2018 Discharge date: 03/15/2018  Admitted From: home Disposition:  Home  Recommendations for Outpatient Follow-up:  1. Follow up with IR in 1-2 weeks for repeated CT scan abdomen and pelvis. 2. GI in 2 to 4 weeks, she will need a colonoscopy in 6 weeks to confirm suspicions. 3. Please obtain BMP/CBC in one week   Home Health:no Equipment/Devices:none  Discharge Condition:stable CODE STATUS:full Diet recommendation: Heart Healthy  Brief/Interim Summary: 52 y.o. 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  Scented to the ED on the day of admission with abdominal pain a CT scan was done that showed worsening fistula and possible pelvic abscess GI was consulted  Discharge Diagnoses:  Principal Problem:   Intra-abdominal abscess ([place]) Active Problems:   Tobacco abuse   COPD (chronic obstructive pulmonary disease) ([facility])   Sepsis due to Escherichia coli (E. coli) ([place])   Enteroenteric fistula   Pelvic abscess in female  Sepsis likely due to enteritis in the setting of intra-abdominal abscess: CT scan of the abdomen and pelvis on admission showed 8.23.2019: Total worsening area compared to previous CT scan with entero-enteral and enterocolonic fistula and a well-developed abscess in the left lower pelvis. There is a high suspicion for Crohn's disease but has not been confirmed. GIs was consulted on admission and recommended IV antibiotics and steroids. Surgery was consulted who recommended to consult IR drainage which was performed on 04/10/2018. Repeated CT scan of the  abdomen and pelvis showed a persistent abscess so IR recommended to leave drain in and repeat imaging as an outpatient. Her  culture grew E. coli sensitive to Bactrim which she will continue to take as an outpatient. Continue antibiotics and steroids as a taper as an outpatient..    Essential hypertension: Continue amlodipine.  COPD: She was placed on a nicotine patch.  Discharge Instructions  Discharge Instructions    Diet - low sodium heart healthy   Complete by:  As directed    Increase activity slowly   Complete by:  As directed      Allergies as of 03/15/2018      Reactions   Hydrocodone-acetaminophen Nausea And Vomiting      Medication List    STOP taking these medications   ciprofloxacin 500 MG tablet Commonly known as:  CIPRO   metroNIDAZOLE 500 MG tablet Commonly known as:  FLAGYL     TAKE these medications   amLODipine 5 MG tablet Commonly known as:  NORVASC Take 1 tablet (5 mg total) by mouth daily. Start taking on:  03/16/2018   famotidine 20 MG tablet Commonly known as:  PEPCID Take 1 tablet (20 mg total) by mouth 2 (two) times daily.   ibuprofen 200 MG tablet Commonly known as:  ADVIL,MOTRIN Take 800 mg by mouth every 6 (six) hours as needed for moderate pain.   nicotine 21 mg/24hr patch Commonly known as:  NICODERM CQ - dosed in mg/24 hours Place 1 patch (21 mg total) onto the skin daily. Start taking on:  03/16/2018   predniSONE 20 MG tablet Commonly known as:  DELTASONE Take 3 tablets (60 mg  total) by mouth daily with breakfast for 14 days. Start taking on:  03/16/2018 What changed:    medication strength  how much to take  how to take this  when to take this  additional instructions   predniSONE 20 MG tablet Commonly known as:  DELTASONE Take 2 tablets (40 mg total) by mouth daily for 14 days. Start taking on:  03/29/2018 What changed:  You were already taking a medication with the same name, and this prescription was added. Make  sure you understand how and when to take each.   sulfamethoxazole-trimethoprim 800-160 MG tablet Commonly known as:  BACTRIM DS,SEPTRA DS Take 1 tablet by mouth every 12 (twelve) hours.      Follow-up Information    [place]. Go on 03/26/2018.   Why:  at 9:10am for an appointment with Dr Barbera Setters information: [place] [phone]       [name], MD. Call.   Specialty:  General Surgery Why:  We are working on your appointment, call to confirm Please arrive 30 minutes prior to your appointment to check in and fill out paperwork. Bring photo ID and insurance information. Contact information: [address] [place] [address] [phone]          Allergies  Allergen Reactions  . Hydrocodone-Acetaminophen Nausea And Vomiting    Consultations:  GI  Surgery  IR   Procedures/Studies: Ct Abdomen Pelvis W Contrast  Result Date: 03/14/2018 CLINICAL DATA:  Pelvic abscess, status post percutaneous drain 03/10/2018 EXAM: CT ABDOMEN AND PELVIS WITH CONTRAST TECHNIQUE: Multidetector CT imaging of the abdomen and pelvis was performed using the standard protocol following bolus administration of intravenous contrast. CONTRAST:  12m OMNIPAQUE IOHEXOL 300 MG/ML  SOLN COMPARISON:  03/10/2018 FINDINGS: Lower chest: No acute abnormality. Hepatobiliary: No focal liver abnormality is seen. No gallstones, gallbladder wall thickening, or biliary dilatation. Pancreas: Unremarkable. No pancreatic ductal dilatation or surrounding inflammatory changes. Spleen: Normal in size without focal abnormality. Adrenals/Urinary Tract: Adrenal glands are unremarkable. Kidneys are normal, without renal calculi, focal lesion, or hydronephrosis. Bladder is unremarkable. Stomach/Bowel: Negative for bowel obstruction, significant dilatation, ileus, or free air. Left lower quadrant anterior pelvic drain  catheter is stable in position. There is a persistent fluid collection at the drain catheter site containing enteric contrast. This collection measures 5.5 x 2.4 cm, image 58 series 2. Findings suspicious for adjacent fistulas to the sigmoid colon and the adjacent ileum. Adjacent wall thickening persist of the sigmoid colon. Diverticular disease also evident. No new fluid collections within the abdomen or pelvis. No free fluid or ascites. Vascular/Lymphatic: Aortic atherosclerosis noted. No aneurysm or dissection. Mesenteric and renal vasculature appear patent. Iliac vessels are patent. No adenopathy. Reproductive: Uterus and adnexa normal in size. Other: No abdominal wall hernia or abnormality. No abdominopelvic ascites. Musculoskeletal: Minor lumbar spine degenerative changes. No acute osseous finding. IMPRESSION: Interval placement of a anterior left pelvic drain catheter. 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 Minimal improvement in the pelvic inflammation. Wall thickening persist of the sigmoid colon with diverticular disease noted. Appearance suggest some improvement in the acute diverticulitis/colitis. No new abdominal or pelvic fluid collections. Negative for obstruction, ileus or free air. Electronically Signed   By: MJerilynn Mages  Shick M.D.   On: 03/14/2018 13:20   Ct Abdomen Pelvis W Contrast  Result Date: 03/09/2018 CLINICAL  DATA:  52 year old female with history of 46 weeks of abdominal cramping, nausea, vomiting and diarrhea. Lethargy. Possible history of diverticulosis. Possible history of Crohn's disease. EXAM: CT ABDOMEN AND PELVIS WITH CONTRAST TECHNIQUE: Multidetector CT imaging of the abdomen and pelvis was performed using the standard protocol following bolus administration of intravenous contrast. CONTRAST:  123m ISOVUE-300 IOPAMIDOL (ISOVUE-300) INJECTION 61% COMPARISON:  CT  the abdomen and pelvis 02/23/2018. FINDINGS: Lower chest: Unremarkable. Hepatobiliary: No cystic or solid hepatic lesions. No intra or extrahepatic biliary ductal dilatation. Gallbladder is normal in appearance. Pancreas: No pancreatic mass. No pancreatic ductal dilatation. No pancreatic or peripancreatic fluid or inflammatory changes. Spleen: Unremarkable. Adrenals/Urinary Tract: Bilateral kidneys and adrenal glands are normal in appearance. There is no hydroureteronephrosis. Urinary bladder is normal in appearance. Stomach/Bowel: Normal appearance of the stomach. No pathologic dilatation of small bowel or colon. When compared to the prior examination, the previously noted mural thickening and luminal narrowing associated with the distal and terminal ileum has resolved. There continues to be some mural thickening and mucosal hyperenhancement with luminal narrowing in the proximal ileum or distal jejunum, best appreciated on axial image 55 of series 2 and coronal image 40 of series 6. There is also severe thickening of the sigmoid colon. 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 These fistulous tracts and the surrounding inflammation appears more extensive than the prior examination. These tracts are highly irregular in shape and therefore difficult to accurately measure, however, the largest of these currently measures approximately 6.3 x 4.8 x 4.8 cm (axial image 62 of series 2 and coronal image 52 of series 6). In addition, there is a rim enhancing fluid collection in the low left anatomic pelvis (axial image 66 of series 2 and coronal image 46 of series  6) which currently measures 2.2 x 1.8 x 1.7 cm, compatible with a small abscess. Normal appendix. Vascular/Lymphatic: Aortic atherosclerosis, without evidence of aneurysm or dissection in the abdominal or pelvic vasculature. No lymphadenopathy noted in the abdomen or pelvis. Reproductive: Uterus and ovaries are unremarkable in appearance. Other: Trace volume of ascites.  No pneumoperitoneum. Musculoskeletal: No aggressive appearing lytic or blastic lesions are noted in the visualized portions of the skeleton. IMPRESSION: 1. Although there has been resolution of the inflammatory changes involving the distal and terminal ileum, there continues to be extensive areas of mural thickening involving mid to distal small bowel, as well as the sigmoid colon. In the low anatomic pelvis there are worsening areas which appear to reflect enteroenteric and/or enterocolonic fistulae, as well as a developing abscess in the low left hemipelvis, as detailed above. Surgical consultation is recommended. 2. Aortic atherosclerosis. 3. Additional incidental findings, as above. These results will be called to the ordering clinician or representative by the Radiologist Assistant, and communication documented in the PACS or zVision Dashboard. Electronically Signed   By: DVinnie LangtonM.D.   On: 03/09/2018 11:24   Ct Abdomen Pelvis W Contrast  Result Date: 02/23/2018 CLINICAL DATA:  52year old female with history of epigastric pain. EXAM: CT ABDOMEN AND PELVIS WITH CONTRAST TECHNIQUE: Multidetector CT imaging of the abdomen and pelvis was performed using the standard protocol following bolus administration of intravenous contrast.  CONTRAST:  128m OMNIPAQUE IOHEXOL 300 MG/ML  SOLN COMPARISON:  CT the abdomen and pelvis 11/04/2012. FINDINGS: Lower chest: 3 mm pulmonary nodule in the right middle lobe (axial image 1 of series 5), unchanged compared to 2014, considered definitively benign requiring no future imaging follow-up.  Hepatobiliary: Low-attenuation/hypoperfusion adjacent to the falciform ligament most evident in segment 4B, likely to represent a benign perfusion anomaly. No other definite suspicious appearing cystic or solid hepatic lesions. No intra or extrahepatic biliary ductal dilatation. Gallbladder is normal in appearance. Pancreas: No pancreatic mass. No pancreatic ductal dilatation. No pancreatic or peripancreatic fluid or inflammatory changes. Spleen: Unremarkable. Adrenals/Urinary Tract: Mild left hydroureteronephrosis which appears related to extrinsic compression of the distal third of the left ureter (discussed below). Bilateral kidneys and adrenal glands are otherwise normal in appearance. Urinary bladder is normal in appearance. Stomach/Bowel: Normal appearance of the stomach. No pathologic dilatation of small bowel or colon. However, there are areas throughout the mid to distal small bowel with profound mural thickening and extensive surrounding inflammatory changes. Terminal ileum does appear involved with mural thickening and luminal narrowing. The most severe area of mural thickening appears to be in the proximal ileum or distal jejunum, best appreciated in the central aspect of the pelvis where there are extensive inflammatory changes in the small bowel mesentery, as well as extraluminal tracks which appear to reflect enteroenteric and/or enterocolonic fistulae, which appear to contain fecalized contents. This is best appreciated on coronal images 39-75 of series 6. Normal appendix. Vascular/Lymphatic: Aortic atherosclerosis, without evidence of aneurysm or dissection in the abdominal or pelvic vasculature. No lymphadenopathy noted in the abdomen or pelvis. Reproductive: Uterus and ovaries are unremarkable in appearance. Other: Trace volume of ascites.  No pneumoperitoneum. Musculoskeletal: There are no aggressive appearing lytic or blastic lesions noted in the visualized portions of the skeleton. IMPRESSION:  1. There is a spectrum of findings suggestive of active Crohn's disease, as detailed above. In addition to extensive areas of mural thickening, mucosal hyperenhancement and surrounding inflammatory changes, there appears to be enteroenteric and/or enterocolonic fistulae, as discussed above. 2. Aortic atherosclerosis. 3. Additional incidental findings, as above. Electronically Signed   By: DVinnie LangtonM.D.   On: 02/23/2018 22:48   UKoreaPelvic Complete With Transvaginal  Result Date: 02/15/2018 CLINICAL DATA:  Abdominal pain. EXAM: TRANSABDOMINAL AND TRANSVAGINAL ULTRASOUND OF PELVIS TECHNIQUE: Both transabdominal and transvaginal ultrasound examinations of the pelvis were performed. Transabdominal technique was performed for global imaging of the pelvis including uterus, ovaries, adnexal regions, and pelvic cul-de-sac. It was necessary to proceed with endovaginal exam following the transabdominal exam to visualize the ovaries and endometrial stripe and better visualize the uterus. COMPARISON:  None FINDINGS: Uterus Measurements: 5.4 x 4.0 x 3.0 cm. No fibroids or other mass visualized. Endometrium Thickness: 3.2 mm. Small focal calcification. Small amount of fluid in the cervical canal. Right ovary Measurements: 1.7 x 1.6 x 1.2 cm. Normal appearance/no adnexal mass. Left ovary Measurements: 1.9 x 1.3 x 1.3 cm. Normal appearance/no adnexal mass. Other findings Small to moderate amount of free peritoneal fluid. IMPRESSION: Small to moderate amount of free peritoneal fluid. Otherwise, unremarkable examination. Electronically Signed   By: SClaudie ReveringM.D.   On: 02/15/2018 15:34   Ct Image Guided Drainage By Percutaneous Catheter  Result Date: 03/10/2018 INDICATION: 52year old with an abdominopelvic abscess. Concern for bowel fistula on previous imaging. EXAM: CT GUIDED DRAINAGE OF ABDOMINOPELVIC ABSCESS MEDICATIONS: The patient is currently admitted to the hospital and receiving intravenous antibiotics.  ANESTHESIA/SEDATION:  4.0 mg IV Versed 100 mcg IV Fentanyl Moderate Sedation Time:  39 minutes The patient was continuously monitored during the procedure by the interventional radiology nurse under my direct supervision. COMPLICATIONS: None immediate. TECHNIQUE: Informed written consent was obtained from the patient after a thorough discussion of the procedural risks, benefits and alternatives. All questions were addressed. A timeout was performed prior to the initiation of the procedure. PROCEDURE: Patient was placed supine on the CT scanner. Images through the lower abdomen and pelvis were obtained. The area of concern in the left pelvic region was identified. The overlying skin was prepped and draped in sterile fashion. Skin and soft tissues were anesthetized with 1% lidocaine. 50 gauge trocar needle was directed into the abscess with CT guidance. Thick purulent fluid was aspirated from the needle. A stiff Amplatz wire was advanced into the collection. The tract was dilated to accommodate a 10 [place] multipurpose drain. Approximately 5 mL of thick purulent fluid was removed from the collection. Drain was sutured to skin. FINDINGS: Again noted is an abscess collection in the left lower abdomen and pelvic region. There is now oral contrast within the abscess and there is concern for a fistula connection between small bowel and sigmoid colon on sequence 2, image 28. Thick purulent fluid was removed from the collection. Drain position confirmed within the collection. IMPRESSION: CT-guided placement of a drainage catheter within the abdominopelvic abscess collection. CT images raise concern for fistula connections between small bowel, sigmoid colon and the abscess. Electronically Signed   By: Markus Daft M.D.   On: 03/10/2018 13:11   (Echo, Carotid, EGD, Colonoscopy, ERCP)    Subjective:   Discharge Exam: Vitals:   03/15/18 1004 03/15/18 1136  BP: 137/85   Pulse:    Resp:    Temp:    SpO2:  96%    Vitals:   03/15/18 0553 03/15/18 0852 03/15/18 1004 03/15/18 1136  BP: 131/87 (!) 137/94 137/85   Pulse: 83 (!) 101    Resp: 16 14    Temp: 98.3 F (36.8 C) 98.5 F (36.9 C)    TempSrc: Oral Oral    SpO2: 99% 99%  96%  Weight:      Height:        General: Pt is alert, awake, not in acute distress Cardiovascular: RRR, S1/S2 +, no rubs, no gallops Respiratory: CTA bilaterally, no wheezing, no rhonchi Abdominal: Soft, NT, ND, bowel sounds + Extremities: no edema, no cyanosis    The results of significant diagnostics from this hospitalization (including imaging, microbiology, ancillary and laboratory) are listed below for reference.     Microbiology: Recent Results (from the past 240 hour(s))  Blood culture (routine x 2)     Status: None   Collection Time: 03/09/18  1:15 PM  Result Value Ref Range Status   Specimen Description   Final    BLOOD RIGHT HAND Performed at [facility] [address], [place], [place] [address]    Special Requests   Final    BOTTLES DRAWN AEROBIC AND ANAEROBIC Blood Culture adequate volume Performed at [facility] [address], [place], [place] [address]    Culture   Final    NO GROWTH 5 DAYS Performed at [facility], [place] [address], [place], [place] [address]    Report Status 03/14/2018 FINAL  Final  Blood culture (routine x 2)     Status: None   Collection Time: 03/09/18  1:46 PM  Result Value  Ref Range Status   Specimen Description   Final    BLOOD RIGHT HAND Performed at [facility] [address], [place], [place] [address]    Special Requests   Final    BOTTLES DRAWN AEROBIC AND ANAEROBIC Blood Culture adequate volume Performed at [facility] [address], [place], [place] [address]    Culture   Final    NO GROWTH 5 DAYS Performed at [facility], [place] [address], [place], [place] [address]    Report Status 03/14/2018 FINAL   Final  Aerobic/Anaerobic Culture (surgical/deep wound)     Status: None   Collection Time: 03/10/18 12:24 PM  Result Value Ref Range Status   Specimen Description   Final    PELVIS ABSCESS Performed at [facility] [address], [place], [place] [address]    Special Requests   Final    NONE Performed at [facility], [place] [address], [place], [place] [address]    Gram Stain   Final    FEW WBC PRESENT,BOTH PMN AND MONONUCLEAR RARE GRAM POSITIVE COCCI IN PAIRS RARE GRAM VARIABLE ROD    Culture   Final    MODERATE ESCHERICHIA COLI MODERATE BIFIDOBACTERIUM SPECIES BETA LACTAMASE POSITIVE Performed at [facility], [place] [address], [place], [place] [address]    Report Status 03/15/2018 FINAL  Final   Organism ID, Bacteria ESCHERICHIA COLI  Final      Susceptibility   Escherichia coli - MIC*    AMPICILLIN >=32 RESISTANT Resistant     CEFAZOLIN 8 SENSITIVE Sensitive     CEFEPIME <=1 SENSITIVE Sensitive     CEFTAZIDIME <=1 SENSITIVE Sensitive     CEFTRIAXONE <=1 SENSITIVE Sensitive     CIPROFLOXACIN >=4 RESISTANT Resistant     GENTAMICIN <=1 SENSITIVE Sensitive     IMIPENEM <=0.25 SENSITIVE Sensitive     TRIMETH/SULFA <=20 SENSITIVE Sensitive     AMPICILLIN/SULBACTAM >=32 RESISTANT Resistant     PIP/TAZO 8 SENSITIVE Sensitive     Extended ESBL NEGATIVE Sensitive     * MODERATE ESCHERICHIA COLI     Labs: BNP (last 3 results) No results for input(s): BNP in the last 8760 hours. Basic Metabolic Panel: Recent Labs  Lab 03/11/18 0450 03/12/18 0417 03/13/18 0515 03/14/18 0523 03/15/18 0356  NA 137 139 141 138 141  K 4.5 4.5 4.6 4.1 4.0  CL 107 105 105 102 105  CO2 25 27 29 30 29   GLUCOSE 196* 126* 131* 142* 109*  BUN 7 <5* 5* 10 11  CREATININE 0.47 0.49 0.54 0.58 0.55  CALCIUM 8.2* 8.5* 8.9 9.1 8.5*   Liver Function Tests: Recent Labs  Lab 03/09/18 1315  AST 25  ALT 17  ALKPHOS 45  BILITOT 0.7  PROT 6.5  ALBUMIN  3.1*   Recent Labs  Lab 03/09/18 1315  LIPASE 26   No results for input(s): AMMONIA in the last 168 hours. CBC: Recent Labs  Lab 03/11/18 0450 03/12/18 0417 03/13/18 0515 03/14/18 0523 03/15/18 0356  WBC 12.6* 9.9 7.0 9.0 9.4  NEUTROABS 11.7* 8.6* 5.5 7.2 5.0  HGB 10.2* 10.7* 11.1* 11.5* 11.1*  HCT 32.3* 32.6* 33.8* 35.2* 34.9*  MCV 100.0 95.9 97.7 97.0 98.6  PLT 282 277 313 310 275   Cardiac Enzymes: No results for input(s): CKTOTAL, CKMB, CKMBINDEX, TROPONINI in the last 168 hours. BNP: Invalid input(s): POCBNP CBG: Recent Labs  Lab 03/14/18 1651 03/14/18 2000 03/15/18 0724 03/15/18  0809 03/15/18 1150  GLUCAP 161* 126* 69* 73 132*   D-Dimer No results for input(s): DDIMER in the last 72 hours. Hgb A1c No results for input(s): HGBA1C in the last 72 hours. Lipid Profile No results for input(s): CHOL, HDL, LDLCALC, TRIG, CHOLHDL, LDLDIRECT in the last 72 hours. Thyroid function studies No results for input(s): TSH, T4TOTAL, T3FREE, THYROIDAB in the last 72 hours.  Invalid input(s): FREET3 Anemia work up No results for input(s): VITAMINB12, FOLATE, FERRITIN, TIBC, IRON, RETICCTPCT in the last 72 hours. Urinalysis    Component Value Date/Time   COLORURINE YELLOW 03/09/2018 1315   APPEARANCEUR CLEAR 03/09/2018 1315   APPEARANCEUR Hazy 03/20/2014 0201   LABSPEC 1.028 03/09/2018 1315   LABSPEC 1.015 03/20/2014 0201   PHURINE 8.0 03/09/2018 1315   GLUCOSEU NEGATIVE 03/09/2018 1315   GLUCOSEU Negative 03/20/2014 0201   HGBUR NEGATIVE 03/09/2018 1315   BILIRUBINUR NEGATIVE 03/09/2018 1315   BILIRUBINUR Negative 03/20/2014 0201   KETONESUR NEGATIVE 03/09/2018 1315   PROTEINUR NEGATIVE 03/09/2018 1315   UROBILINOGEN 1.0 11/04/2012 1822   NITRITE NEGATIVE 03/09/2018 1315   LEUKOCYTESUR NEGATIVE 03/09/2018 1315   LEUKOCYTESUR Negative 03/20/2014 0201   Sepsis Labs Invalid input(s): PROCALCITONIN,  WBC,  LACTICIDVEN Microbiology Recent Results (from the past  240 hour(s))  Blood culture (routine x 2)     Status: None   Collection Time: 03/09/18  1:15 PM  Result Value Ref Range Status   Specimen Description   Final    BLOOD RIGHT HAND Performed at [facility], [place] [address] [place], [place] [address]    Special Requests   Final    BOTTLES DRAWN AEROBIC AND ANAEROBIC Blood Culture adequate volume Performed at [facility] [address] [place], [place] [address]    Culture   Final    NO GROWTH 5 DAYS Performed at [facility], [place] [address] [place], [place] [address]    Report Status 03/14/2018 FINAL  Final  Blood culture (routine x 2)     Status: None   Collection Time: 03/09/18  1:46 PM  Result Value Ref Range Status   Specimen Description   Final    BLOOD RIGHT HAND Performed at [facility] [address], [place], [place] [address]    Special Requests   Final    BOTTLES DRAWN AEROBIC AND ANAEROBIC Blood Culture adequate volume Performed at [facility] [address], [place], [place] [id number]    Culture   Final    NO GROWTH 5 DAYS Performed at [facility], [place] [address] [place], [place] [address]    Report Status 03/14/2018 FINAL  Final  Aerobic/Anaerobic Culture (surgical/deep wound)     Status: None   Collection Time: 03/10/18 12:24 PM  Result Value Ref Range Status   Specimen Description   Final    PELVIS ABSCESS Performed at [facility] [address] [place], [place] [address]    Special Requests   Final    NONE Performed at [facility], [place] [address] [place], [place] [address]    Gram Stain   Final    FEW WBC PRESENT,BOTH PMN AND MONONUCLEAR RARE GRAM POSITIVE COCCI IN PAIRS RARE GRAM VARIABLE ROD    Culture   Final    MODERATE ESCHERICHIA COLI MODERATE BIFIDOBACTERIUM SPECIES BETA LACTAMASE POSITIVE Performed at [facility], [place] [address] [place],  [place] [address]    Report Status  03/15/2018 FINAL  Final   Organism ID, Bacteria ESCHERICHIA COLI  Final      Susceptibility   Escherichia coli - MIC*    AMPICILLIN >=32 RESISTANT Resistant     CEFAZOLIN 8 SENSITIVE Sensitive     CEFEPIME <=1 SENSITIVE Sensitive     CEFTAZIDIME <=1 SENSITIVE Sensitive     CEFTRIAXONE <=1 SENSITIVE Sensitive     CIPROFLOXACIN >=4 RESISTANT Resistant     GENTAMICIN <=1 SENSITIVE Sensitive     IMIPENEM <=0.25 SENSITIVE Sensitive     TRIMETH/SULFA <=20 SENSITIVE Sensitive     AMPICILLIN/SULBACTAM >=32 RESISTANT Resistant     PIP/TAZO 8 SENSITIVE Sensitive     Extended ESBL NEGATIVE Sensitive     * MODERATE ESCHERICHIA COLI     Time coordinating discharge: 40 minutes  SIGNED:   Charlynne Cousins, MD  [facility] 03/15/2018, 11:54 AM Pager   If 7PM-7AM, please contact night-coverage [email or web address] Password [email or web address]

## 2018-03-15 NOTE — Progress Notes (Signed)
Referring Physician(s): Rayburn, Claiborne Billings  Supervising Physician: Marybelle Killings  Patient Status:  [facility] - In-pt  Chief Complaint: None  Subjective:  Intraabdominal abscess s/p drain placement 03/10/2018 with Dr. Anselm Pancoast. Patient awake and alert laying in bed with no complaints at this time. States that she is doing "much better" and is able to tolerate a regular diet. Intraabdominal abscess drain site c/d/i with scant light brown fluid in gravity bag; drain flushes/aspirates without resistance.  CT abdomen/pelvis 03/14/2018: 1. Interval placement of a anterior left pelvic drain catheter. 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 2. Minimal improvement in the pelvic inflammation. Wall thickening persist of the sigmoid colon with diverticular disease noted. Appearance suggest some improvement in the acute diverticulitis/colitis. 3. No new abdominal or pelvic fluid collections. 4. Negative for obstruction, ileus or free air.   Allergies: Hydrocodone-acetaminophen  Medications: Prior to Admission medications   Medication Sig Start Date End Date Taking? Authorizing Provider  ciprofloxacin (CIPRO) 500 MG tablet Take 1 tablet (500 mg total) by mouth 2 (two) times daily. 03/05/18  Yes Nandigam, Venia Minks, MD  ibuprofen (ADVIL,MOTRIN) 200 MG tablet Take 800 mg by mouth every 6 (six) hours as needed for moderate pain.   Yes [provider]  metroNIDAZOLE (FLAGYL) 500 MG tablet Take 1 tablet (500 mg total) by mouth 2 (two) times daily. 03/05/18  Yes Nandigam, Venia Minks, MD  predniSONE (DELTASONE) 10 MG tablet 40 mg daily x 6 days, 30 mg daily x 7 days, 20 mg daily x 7 days then 10 mg daily x 7 days Patient taking differently: Take 10-40 mg by mouth See admin instructions. Take 40 mg daily x 6 days, 30 mg daily x 7 days, 20 mg daily x 7 days then 10 mg daily x 7 days 02/24/18   Yes Sherwood Gambler, MD     Vital Signs: BP 137/85   Pulse (!) 101   Temp 98.5 F (36.9 C) (Oral)   Resp 14   Ht 5' 2"  (1.575 m)   Wt 113 lb (51.3 kg)   LMP 06/20/2012   SpO2 99%   BMI 20.67 kg/m   Physical Exam  Constitutional: She is oriented to person, place, and time. She appears well-developed and well-nourished. No distress.  Pulmonary/Chest: Effort normal. No respiratory distress.  Abdominal:  Intraabdominal abscess drain site without erythema, drainage, or tenderness; scant light brown fluid in gravity bag; drain flushes/aspirates without resistance.  Neurological: She is alert and oriented to person, place, and time.  Skin: Skin is warm and dry.  Psychiatric: She has a normal mood and affect. Her behavior is normal. Judgment and thought content normal.  Nursing note and vitals reviewed.   Imaging: Ct Abdomen Pelvis W Contrast  Result Date: 03/14/2018 CLINICAL DATA:  Pelvic abscess, status post percutaneous drain 03/10/2018 EXAM: CT ABDOMEN AND PELVIS WITH CONTRAST TECHNIQUE: Multidetector CT imaging of the abdomen and pelvis was performed using the standard protocol following bolus administration of intravenous contrast. CONTRAST:  46m OMNIPAQUE IOHEXOL 300 MG/ML  SOLN COMPARISON:  03/10/2018 FINDINGS: Lower chest: No acute abnormality. Hepatobiliary: No focal liver abnormality is seen. No gallstones, gallbladder wall thickening, or biliary dilatation. Pancreas: Unremarkable. No pancreatic ductal dilatation or surrounding inflammatory changes. Spleen: Normal in size without focal abnormality. Adrenals/Urinary Tract: Adrenal glands are unremarkable. Kidneys are normal, without renal calculi, focal lesion, or hydronephrosis. Bladder is unremarkable. Stomach/Bowel: Negative for bowel obstruction,  significant dilatation, ileus, or free air. Left lower quadrant anterior pelvic drain catheter is stable in position. There is a persistent fluid collection at the drain catheter site  containing enteric contrast. This collection measures 5.5 x 2.4 cm, image 58 series 2. Findings suspicious for adjacent fistulas to the sigmoid colon and the adjacent ileum. Adjacent wall thickening persist of the sigmoid colon. Diverticular disease also evident. No new fluid collections within the abdomen or pelvis. No free fluid or ascites. Vascular/Lymphatic: Aortic atherosclerosis noted. No aneurysm or dissection. Mesenteric and renal vasculature appear patent. Iliac vessels are patent. No adenopathy. Reproductive: Uterus and adnexa normal in size. Other: No abdominal wall hernia or abnormality. No abdominopelvic ascites. Musculoskeletal: Minor lumbar spine degenerative changes. No acute osseous finding. IMPRESSION: Interval placement of a anterior left pelvic drain catheter. 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 Minimal improvement in the pelvic inflammation. Wall thickening persist of the sigmoid colon with diverticular disease noted. Appearance suggest some improvement in the acute diverticulitis/colitis. No new abdominal or pelvic fluid collections. Negative for obstruction, ileus or free air. Electronically Signed   By: Jerilynn Mages.  Shick M.D.   On: 03/14/2018 13:20    Labs:  CBC: Recent Labs    03/12/18 0417 03/13/18 0515 03/14/18 0523 03/15/18 0356  WBC 9.9 7.0 9.0 9.4  HGB 10.7* 11.1* 11.5* 11.1*  HCT 32.6* 33.8* 35.2* 34.9*  PLT 277 313 310 275    COAGS: Recent Labs    03/10/18 0620  INR 0.96  APTT 26    BMP: Recent Labs    03/12/18 0417 03/13/18 0515 03/14/18 0523 03/15/18 0356  NA 139 141 138 141  K 4.5 4.6 4.1 4.0  CL 105 105 102 105  CO2 27 29 30 29   GLUCOSE 126* 131* 142* 109*  BUN <5* 5* 10 11  CALCIUM 8.5* 8.9 9.1 8.5*  CREATININE 0.49 0.54 0.58 0.55  GFRNONAA >60 >60 >60 >60  GFRAA >60 >60 >60 >60    LIVER FUNCTION TESTS: Recent Labs     02/15/18 1402 02/23/18 1646 03/05/18 1446 03/09/18 1315  BILITOT 0.9 0.7 0.6 0.7  AST 24 16 23 25   ALT 13 10 20 17   ALKPHOS 70 55 62 45  PROT 7.4 7.2 7.1 6.5  ALBUMIN 3.3* 2.9* 3.6 3.1*    Assessment and Plan:  Intraabdominal abscess s/p drain placement 03/10/2018 with Dr. Anselm Pancoast. Intraabdominal abscess drain site stable with scant light brown fluid in gravity bag; drain flushes/aspirates without resistance. Continue with Qshift flushes and monitoring/documentation of drain output. Recommend follow-up CT with drain injection in 1 to 2 weeks from last CT (03/14/2018). IR to follow.   Electronically Signed: Earley Abide, PA-C 03/15/2018, 10:43 AM   I spent a total of 15 Minutes at the the patient's bedside AND on the patient's hospital floor or unit, greater than 50% of which was counseling/coordinating care for intraabdominal abscess s/p drain placement.

## 2018-03-15 NOTE — Progress Notes (Signed)
"  I have reviewed and concur with student's documentation."

## 2018-03-20 ENCOUNTER — Emergency Department (HOSPITAL_COMMUNITY)
Admission: EM | Admit: 2018-03-20 | Discharge: 2018-03-20 | Disposition: A | Discharge status: Home / Self Care | Payer: Medicaid Other | Attending: Emergency Medicine | Admitting: Emergency Medicine

## 2018-03-20 ENCOUNTER — Encounter (HOSPITAL_COMMUNITY): Payer: Self-pay | Admitting: Emergency Medicine

## 2018-03-20 DIAGNOSIS — J449 Chronic obstructive pulmonary disease, unspecified: Secondary | ICD-10-CM | POA: Insufficient documentation

## 2018-03-20 DIAGNOSIS — F17210 Nicotine dependence, cigarettes, uncomplicated: Secondary | ICD-10-CM | POA: Diagnosis not present

## 2018-03-20 DIAGNOSIS — Y829 Unspecified medical devices associated with adverse incidents: Secondary | ICD-10-CM | POA: Diagnosis not present

## 2018-03-20 DIAGNOSIS — R000 Tachycardia, unspecified: Secondary | ICD-10-CM | POA: Diagnosis not present

## 2018-03-20 DIAGNOSIS — Z4800 Encounter for change or removal of nonsurgical wound dressing: Secondary | ICD-10-CM | POA: Diagnosis not present

## 2018-03-20 DIAGNOSIS — I10 Essential (primary) hypertension: Secondary | ICD-10-CM | POA: Insufficient documentation

## 2018-03-20 DIAGNOSIS — T8189XA Other complications of procedures, not elsewhere classified, initial encounter: Secondary | ICD-10-CM | POA: Insufficient documentation

## 2018-03-20 DIAGNOSIS — Z79899 Other long term (current) drug therapy: Secondary | ICD-10-CM | POA: Insufficient documentation

## 2018-03-20 DIAGNOSIS — G35 Multiple sclerosis: Secondary | ICD-10-CM | POA: Diagnosis not present

## 2018-03-20 DIAGNOSIS — Z5189 Encounter for other specified aftercare: Secondary | ICD-10-CM

## 2018-03-20 NOTE — ED Provider Notes (Signed)
[facility] DEPT Provider Note   CSN: [id number] Arrival date & time: 03/20/18  1423     History   Chief Complaint Chief Complaint  Patient presents with  . tube blockage    HPI Christina Miller is a 52 y.o. female who presents with possible tube blockage. PMH significant for MS, emphysema, GERD, hypertension, possible Crohn's disease. She was discharged from the hospital on 8/29 for a intraabdominal fistula and pelvic abscess. She came today because she is concerned that the tube to her bag may be blocked. There is drainage in the tube and some fluid in the bag but very minimal and she is concerned. She is requesting a new bag. She denies any fevers or abdominal pain. She states she has all her appointments for follow up set up.  HPI  Past Medical History:  Diagnosis Date  . Abnormal Pap smear    cryo  . Anemia   . Bone fracture    ankle  . COPD (chronic obstructive pulmonary disease) ([place])   . Emphysema   . GERD (gastroesophageal reflux disease)   . Headache(784.0)   . Hypertension   . Infection    urinary tract infection  . MS (multiple sclerosis) [facility])     Patient Active Problem List   Diagnosis Date Noted  . Pelvic abscess in female   . 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 Intra-abdominal abscess ([place]) 03/09/2018  . COPD (chronic obstructive pulmonary disease) ([place]) 03/09/2018  . Sepsis due to Escherichia coli (E. coli) ([place]) 03/09/2018  . COPD exacerbation ([place]) 04/21/2016  . Protrusion of lumbar intervertebral disc 04/21/2016  . Smoking addiction 04/21/2016  . Breast lump on left side at 7 o'clock position 06/22/2012  . Hypertension, benign 06/02/2011  . Tobacco abuse 06/02/2011  . Perimenopause 06/02/2011  . Hematuria, microscopic 06/02/2011    Past Surgical History:  Procedure Laterality Date  . ARM WOUND REPAIR / CLOSURE    . BREAST BIOPSY Left 07/03/2012  . COLPOSCOPY    . DILATION AND CURETTAGE OF UTERUS    . TUBAL  LIGATION       OB History    Gravida  7   Para  2   Term  1   Preterm  1   AB  5   Living  2     SAB  3   TAB  2   Ectopic      Multiple      Live Births  1            Home Medications    Prior to Admission medications   Medication Sig Start Date End Date Taking? Authorizing Provider  amLODipine (NORVASC) 5 MG tablet Take 1 tablet (5 mg total) by mouth daily. 03/16/18   Charlynne Cousins, MD  famotidine (PEPCID) 20 MG tablet Take 1 tablet (20 mg total) by mouth 2 (two) times daily. 03/15/18   Charlynne Cousins, MD  ibuprofen (ADVIL,MOTRIN) 200 MG tablet Take 800 mg by mouth every 6 (six) hours as needed for moderate pain.    [provider]  nicotine (NICODERM CQ - DOSED IN MG/24 HOURS) 21 mg/24hr patch Place 1 patch (21 mg total) onto the skin daily. 03/16/18   Charlynne Cousins, MD  predniSONE (DELTASONE) 20 MG tablet Take 3 tablets (60 mg total) by mouth daily with breakfast for 14 days. 03/16/18 03/30/18  Charlynne Cousins, MD  predniSONE (DELTASONE) 20 MG tablet Take 2 tablets (40 mg total) by mouth  daily for 14 days. 03/29/18 04/12/18  Charlynne Cousins, MD  sulfamethoxazole-trimethoprim (BACTRIM DS,SEPTRA DS) 800-160 MG tablet Take 1 tablet by mouth every 12 (twelve) hours. 03/15/18   Charlynne Cousins, MD    Family History Family History  Problem Relation Age of Onset  . Diabetes Father   . Diabetes Sister   . Hypertension Sister   . Diabetes Brother   . Hypertension Brother   . Cancer Mother   . Other Neg Hx     Social History Social History   Tobacco Use  . Smoking status: Current Every Day Smoker    Packs/day: 1.00    Types: Cigarettes  . Smokeless tobacco: Former Network engineer Use Topics  . Alcohol use: Yes    Comment: Quit Sep 02, 2016  . Drug use: No     Allergies   Hydrocodone-acetaminophen   Review of Systems Review of Systems  Constitutional: Negative for fever.  Gastrointestinal: Negative for  abdominal pain, nausea and vomiting.  All other systems reviewed and are negative.    Physical Exam Updated Vital Signs BP (!) 123/98 (BP Location: Right Arm)   Pulse (!) 106   Temp 98.5 F (36.9 C) (Oral)   Resp 18   Ht 5' 2"  (1.575 m)   Wt 51.4 kg   LMP 06/20/2012   SpO2 97%   BMI 20.71 kg/m   Physical Exam  Constitutional: She is oriented to person, place, and time. She appears well-developed and well-nourished. No distress.  Calm, cooperative. Texting on phone without difficulty  HENT:  Head: Normocephalic and atraumatic.  Eyes: Pupils are equal, round, and reactive to light. Conjunctivae are normal. Right eye exhibits no discharge. Left eye exhibits no discharge. No scleral icterus.  Neck: Normal range of motion.  Cardiovascular: Regular rhythm. Tachycardia present.  Pulmonary/Chest: Effort normal and breath sounds normal. No respiratory distress.  Abdominal: Soft. Bowel sounds are normal. She exhibits no distension. There is no tenderness.  Drain tube has thick brown drainage which is able to be milked in to the drainage bag. No obvious blockage  Neurological: She is alert and oriented to person, place, and time.  Skin: Skin is warm and dry.  Psychiatric: She has a normal mood and affect. Her behavior is normal.  Nursing note and vitals reviewed.    ED Treatments / Results  Labs (all labs ordered are listed, but only abnormal results are displayed) Labs Reviewed - No data to display  EKG None  Radiology No results found.  Procedures Procedures (including critical care time)  Medications Ordered in ED Medications - No data to display   Initial Impression / Assessment and Plan / ED Course  I have reviewed the triage vital signs and the nursing notes.  Pertinent labs & imaging results that were available during my care of the patient were reviewed by me and considered in my medical decision making (see chart for details).  52 year old female presents  with concerns of a blocked gravity bag since she has not had a lot of output in to the bag. She is tachycardic in triage. This has improved on recheck. She denies any abdominal pain and reports a good appetite. Tube was flushed by nursing and there was adequate flow in to the bag. She states she will f/u with GI. Return precautions given.   Final Clinical Impressions(s) / ED Diagnoses   Final diagnoses:  Encounter for wound re-check    ED Discharge Orders    None  Recardo Evangelist, PA-C 03/20/18 1851    Daleen Bo, MD 03/21/18 [phone]

## 2018-03-20 NOTE — ED Triage Notes (Signed)
Pt reports has abdominal abscess and has gravity drainage tube placed and noticed drainage in tube but none in bag since yesterday. Denies pain, feels that there is blockage in gravity tube.

## 2018-03-21 ENCOUNTER — Other Ambulatory Visit: Payer: Self-pay | Admitting: Interventional Radiology

## 2018-03-21 ENCOUNTER — Other Ambulatory Visit: Payer: Self-pay | Admitting: Surgery

## 2018-03-21 DIAGNOSIS — N739 Female pelvic inflammatory disease, unspecified: Secondary | ICD-10-CM

## 2018-03-22 ENCOUNTER — Telehealth: Payer: Self-pay | Admitting: Gastroenterology

## 2018-03-23 MED FILL — NORMAL SALINE FLUSH SYRINGE: 0.9 | 20 days supply | Qty: 200 | Fill #0

## 2018-03-23 NOTE — Telephone Encounter (Signed)
Spoke with the patient. She will call the surgeon. She is in need of the syringes and saline she is supposed to use to "flush my tube out everyday." She has an appointment to establish with a PCP next week.

## 2018-03-26 ENCOUNTER — Encounter: Payer: Self-pay | Admitting: Internal Medicine

## 2018-03-26 ENCOUNTER — Ambulatory Visit: Payer: Medicaid Other | Attending: Internal Medicine | Admitting: Internal Medicine

## 2018-03-26 VITALS — BP 153/95 | HR 105 | Temp 98.0°F | Resp 16 | Ht 62.0 in | Wt 115.6 lb

## 2018-03-26 DIAGNOSIS — Z131 Encounter for screening for diabetes mellitus: Secondary | ICD-10-CM | POA: Diagnosis not present

## 2018-03-26 DIAGNOSIS — N739 Female pelvic inflammatory disease, unspecified: Secondary | ICD-10-CM | POA: Diagnosis not present

## 2018-03-26 DIAGNOSIS — J449 Chronic obstructive pulmonary disease, unspecified: Secondary | ICD-10-CM | POA: Diagnosis not present

## 2018-03-26 DIAGNOSIS — F17210 Nicotine dependence, cigarettes, uncomplicated: Secondary | ICD-10-CM | POA: Insufficient documentation

## 2018-03-26 DIAGNOSIS — Z885 Allergy status to narcotic agent status: Secondary | ICD-10-CM | POA: Diagnosis not present

## 2018-03-26 DIAGNOSIS — G35 Multiple sclerosis: Secondary | ICD-10-CM | POA: Diagnosis not present

## 2018-03-26 DIAGNOSIS — D649 Anemia, unspecified: Secondary | ICD-10-CM | POA: Insufficient documentation

## 2018-03-26 DIAGNOSIS — I10 Essential (primary) hypertension: Secondary | ICD-10-CM | POA: Diagnosis present

## 2018-03-26 DIAGNOSIS — K632 Fistula of intestine: Secondary | ICD-10-CM | POA: Diagnosis not present

## 2018-03-26 DIAGNOSIS — F17200 Nicotine dependence, unspecified, uncomplicated: Secondary | ICD-10-CM | POA: Insufficient documentation

## 2018-03-26 DIAGNOSIS — Z79899 Other long term (current) drug therapy: Secondary | ICD-10-CM | POA: Insufficient documentation

## 2018-03-26 MED ORDER — AMLODIPINE BESYLATE 10 MG PO TABS: 10.0000 mg | ORAL_TABLET | Freq: Every day | ORAL | 5 refills | Status: DC

## 2018-03-26 MED ORDER — ALBUTEROL SULFATE HFA 108 (90 BASE) MCG/ACT IN AERS: 2.0000 | INHALATION_SPRAY | Freq: Four times a day (QID) | RESPIRATORY_TRACT | 2 refills | Status: DC | PRN

## 2018-03-26 MED FILL — ALBUTEROL SULFATE HFA 108 (: 108 (90 BAS | 25 days supply | Qty: 18 | Fill #0

## 2018-03-26 MED FILL — AMLODIPINE BESYLATE 10 MG T: 10 | 30 days supply | Qty: 30 | Fill #0

## 2018-03-26 NOTE — Patient Instructions (Addendum)
Increase amlodipine from 5 mg daily to 10 mg daily.  Please limit the salt in your foods. Use the albuterol inhaler as needed for shortness of breath. Keep the appointment with the gastroenterologist and the surgeon as scheduled for later this month.   Fall Prevention in the Home Falls can cause injuries. They can happen to people of all ages. There are many things you can do to make your home safe and to help prevent falls. What can I do on the outside of my home?  Regularly fix the edges of walkways and driveways and fix any cracks.  Remove anything that might make you trip as you walk through a door, such as a raised step or threshold.  Trim any bushes or trees on the path to your home.  Use bright outdoor lighting.  Clear any walking paths of anything that might make someone trip, such as rocks or tools.  Regularly check to see if handrails are loose or broken. Make sure that both sides of any steps have handrails.  Any raised decks and porches should have guardrails on the edges.  Have any leaves, snow, or ice cleared regularly.  Use sand or salt on walking paths during winter.  Clean up any spills in your garage right away. This includes oil or grease spills. What can I do in the bathroom?  Use night lights.  Install grab bars by the toilet and in the tub and shower. Do not use towel bars as grab bars.  Use non-skid mats or decals in the tub or shower.  If you need to sit down in the shower, use a plastic, non-slip stool.  Keep the floor dry. Clean up any water that spills on the floor as soon as it happens.  Remove soap buildup in the tub or shower regularly.  Attach bath mats securely with double-sided non-slip rug tape.  Do not have throw rugs and other things on the floor that can make you trip. What can I do in the bedroom?  Use night lights.  Make sure that you have a light by your bed that is easy to reach.  Do not use any sheets or blankets that are  too big for your bed. They should not hang down onto the floor.  Have a firm chair that has side arms. You can use this for support while you get dressed.  Do not have throw rugs and other things on the floor that can make you trip. What can I do in the kitchen?  Clean up any spills right away.  Avoid walking on wet floors.  Keep items that you use a lot in easy-to-reach places.  If you need to reach something above you, use a strong step stool that has a grab bar.  Keep electrical cords out of the way.  Do not use floor polish or wax that makes floors slippery. If you must use wax, use non-skid floor wax.  Do not have throw rugs and other things on the floor that can make you trip. What can I do with my stairs?  Do not leave any items on the stairs.  Make sure that there are handrails on both sides of the stairs and use them. Fix handrails that are broken or loose. Make sure that handrails are as long as the stairways.  Check any carpeting to make sure that it is firmly attached to the stairs. Fix any carpet that is loose or worn.  Avoid having throw rugs at  the top or bottom of the stairs. If you do have throw rugs, attach them to the floor with carpet tape.  Make sure that you have a light switch at the top of the stairs and the bottom of the stairs. If you do not have them, ask someone to add them for you. What else can I do to help prevent falls?  Wear shoes that: ? Do not have high heels. ? Have rubber bottoms. ? Are comfortable and fit you well. ? Are closed at the toe. Do not wear sandals.  If you use a stepladder: ? Make sure that it is fully opened. Do not climb a closed stepladder. ? Make sure that both sides of the stepladder are locked into place. ? Ask someone to hold it for you, if possible.  Clearly mark and make sure that you can see: ? Any grab bars or handrails. ? First and last steps. ? Where the edge of each step is.  Use tools that help you move  around (mobility aids) if they are needed. These include: ? Canes. ? Walkers. ? Scooters. ? Crutches.  Turn on the lights when you go into a dark area. Replace any light bulbs as soon as they burn out.  Set up your furniture so you have a clear path. Avoid moving your furniture around.  If any of your floors are uneven, fix them.  If there are any pets around you, be aware of where they are.  Review your medicines with your doctor. Some medicines can make you feel dizzy. This can increase your chance of falling. Ask your doctor what other things that you can do to help prevent falls. This information is not intended to replace advice given to you by your health care provider. Make sure you discuss any questions you have with your health care provider. Document Released: 04/30/2009 Document Revised: 12/10/2015 Document Reviewed: 08/08/2014 Elsevier Interactive Patient Education  [employer].

## 2018-03-26 NOTE — Progress Notes (Signed)
Patient ID: Christina Miller, female    DOB: Apr 22, 1966  MRN: [id number]  CC: re-establish; Hypertension; and Hospitalization Follow-up (ED f/u)   Subjective: Christina Miller is a 52 y.o. female who presents for to become reestablish and for hospital follow-up. Her concerns today include:  Patient with history of MS, COPD, HTN, tobacco dependence, GERD and possible Crohn's disease  Patient recently hospitalized 8/23-29/2019 at [facility] with abdominal pain, nausea and vomiting.  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  Thought to have possible Crohn's disease by GI and was started on Cipro, Flagyl and prednisone.  Underwent drainage of the abscess by interventional radiology.  Cultures grew E. coli sensitive to Bactrim so antibiotics was changed accordingly.  Repeat CT scan of the abdomen and pelvis showed a persistent abscess so IR recommended to leave the drain in and repeat imaging as an outpatient in about 2 weeks.  She will also need follow-up with GI for colonoscopy in about 6 weeks.  -Has CT scan scheduled on 03/29/2018 at [facility].  Will see GI [name] on the same day.  Appt with surgeon, Dr. Johney Maine 04/03/2018 -has minimal drainage. No fever. Has nl BM, no diarrhea since hosp, no blood in stools. Good appetite. -currently on Bactrim and Prednisone  MS:  Dx with MS in 2004. She was seeing a neurologist but not in past 5 yrs due to lack of insurance.  Denies any recent flareups.  She is not on any medications for MS.  HTN:  Takes Norvasc consistently in the afternoons.  -limits salt in foods.  No CP.  Little LE edema  COPD: little SOB when out in the heat. Smokes 1/2 pk a day; was 1-1.5 pk.  Trying to quit.  Has the patches.  Plans to start using them tomorrow.   HM: due for Flu and Tdap.  Wants to hold off until her current infection issue clears  Patient Active Problem List   Diagnosis Date Noted  . Pelvic abscess in female    . 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 Intra-abdominal abscess ([place]) 03/09/2018  . COPD (chronic obstructive pulmonary disease) ([place]) 03/09/2018  . Sepsis due to Escherichia coli (E. coli) ([place]) 03/09/2018  . COPD exacerbation ([place]) 04/21/2016  . Protrusion of lumbar intervertebral disc 04/21/2016  . Smoking addiction 04/21/2016  . Breast lump on left side at 7 o'clock position 06/22/2012  . Hypertension, benign 06/02/2011  . Tobacco abuse 06/02/2011  . Perimenopause 06/02/2011  . Hematuria, microscopic 06/02/2011     Current Outpatient Medications on File Prior to Visit  Medication Sig Dispense Refill  . famotidine (PEPCID) 20 MG tablet Take 1 tablet (20 mg total) by mouth 2 (two) times daily. 30 tablet 0  . ibuprofen (ADVIL,MOTRIN) 200 MG tablet Take 800 mg by mouth every 6 (six) hours as needed for moderate pain.    . nicotine (NICODERM CQ - DOSED IN MG/24 HOURS) 21 mg/24hr patch Place 1 patch (21 mg total) onto the skin daily. 28 patch 0  . predniSONE (DELTASONE) 20 MG tablet Take 3 tablets (60 mg total) by mouth daily with breakfast for 14 days. 42 tablet 0  . [START ON 03/29/2018] predniSONE (DELTASONE) 20 MG tablet Take 2 tablets (40 mg total) by mouth daily for 14 days. 28 tablet 0  . sulfamethoxazole-trimethoprim (BACTRIM DS,SEPTRA DS) 800-160 MG tablet Take 1 tablet by mouth every 12 (twelve) hours. 30 tablet 0   No current facility-administered medications on  file prior to visit.     Allergies  Allergen Reactions  . Hydrocodone-Acetaminophen Nausea And Vomiting    Social History   Socioeconomic History  . Marital status: Legally Separated    Spouse name: Not on file  . Number of children: Not on file  . Years of education: Not on file  . Highest education level: Not on file  Occupational History  . Not on file  Social Needs  . Financial resource strain: Not on file  . Food insecurity:    Worry: Not on file    Inability: Not on file  . Transportation needs:     Medical: Not on file    Non-medical: Not on file  Tobacco Use  . Smoking status: Current Every Day Smoker    Packs/day: 1.00    Types: Cigarettes  . Smokeless tobacco: Former Network engineer and Sexual Activity  . Alcohol use: Yes    Comment: Quit Sep 02, 2016  . Drug use: No  . Sexual activity: Yes    Birth control/protection: Surgical  Lifestyle  . Physical activity:    Days per week: Not on file    Minutes per session: Not on file  . Stress: Not on file  Relationships  . Social connections:    Talks on phone: Not on file    Gets together: Not on file    Attends religious service: Not on file    Active member of club or organization: Not on file    Attends meetings of clubs or organizations: Not on file    Relationship status: Not on file  . Intimate partner violence:    Fear of current or ex partner: Not on file    Emotionally abused: Not on file    Physically abused: Not on file    Forced sexual activity: Not on file  Other Topics Concern  . Not on file  Social History Narrative   ** Merged History Encounter **        Family History  Problem Relation Age of Onset  . Diabetes Father   . Diabetes Sister   . Hypertension Sister   . Diabetes Brother   . Hypertension Brother   . Cancer Mother   . Other Neg Hx     Past Surgical History:  Procedure Laterality Date  . ARM WOUND REPAIR / CLOSURE    . BREAST BIOPSY Left 07/03/2012  . COLPOSCOPY    . DILATION AND CURETTAGE OF UTERUS    . TUBAL LIGATION      ROS: Review of Systems Neg except as above  PHYSICAL EXAM: BP (!) 153/95   Pulse (!) 105   Temp 98 F (36.7 C) (Oral)   Resp 16   Ht 5' 2"  (1.575 m)   Wt 115 lb 9.6 oz (52.4 kg)   LMP 06/20/2012   SpO2 96%   BMI 21.14 kg/m   Wt Readings from Last 3 Encounters:  03/26/18 115 lb 9.6 oz (52.4 kg)  03/20/18 113 lb 4 oz (51.4 kg)  03/09/18 113 lb (51.3 kg)   Physical Exam  General appearance - alert, well appearing, and in no distress Mental  status - normal mood, behavior, speech, dress, motor activity, and thought processes Mouth - mucous membranes moist, pharynx normal without lesions Neck - supple, no significant adenopathy Chest -breath sounds mildly decreased bilaterally, no wheezes, rales or rhonchi, symmetric air entry Heart - normal rate, regular rhythm, normal S1, S2, no murmurs, rubs, clicks or gallops  Abdomen -normal bowel sounds nondistended, nontender.  No drainage noted on the left side with minimal brownish discharge in the bag that is attached to her left thigh Extremities - peripheral pulses normal, no pedal edema, no clubbing or cyanosis  Lab Results  Component Value Date   WBC 9.4 03/15/2018   HGB 11.1 (L) 03/15/2018   HCT 34.9 (L) 03/15/2018   MCV 98.6 03/15/2018   PLT 275 03/15/2018     Chemistry      Component Value Date/Time   NA 141 03/15/2018 0356   NA 145 03/19/2014 1829   K 4.0 03/15/2018 0356   K 3.4 (L) 03/19/2014 1829   CL 105 03/15/2018 0356   CL 112 (H) 03/19/2014 1829   CO2 29 03/15/2018 0356   CO2 27 03/19/2014 1829   BUN 11 03/15/2018 0356   BUN 8 03/19/2014 1829   CREATININE 0.55 03/15/2018 0356   CREATININE 0.70 03/19/2014 1829   CREATININE 0.67 06/02/2011 1604      Component Value Date/Time   CALCIUM 8.5 (L) 03/15/2018 0356   CALCIUM 8.4 (L) 03/19/2014 1829   ALKPHOS 45 03/09/2018 1315   ALKPHOS 58 03/19/2014 1829   AST 25 03/09/2018 1315   AST 28 03/19/2014 1829   ALT 17 03/09/2018 1315   ALT 14 03/19/2014 1829   BILITOT 0.7 03/09/2018 1315   BILITOT 0.2 03/19/2014 1829      ASSESSMENT AND PLAN: 1. Pelvic abscess in female 2. Entero-colonic fistula Patient to keep appointment with the gastroenterologist and general surgeon later this month.  3. Tobacco dependence Patient advised to quit smoking. Discussed health risks associated with smoking including lung and other types of cancers, chronic lung diseases and CV risks.. Pt ready to give trail of quitting.   Discussed methods to help quit including quitting cold [place], use of NRT, Chantix and Bupropion.  She will start using the nicotine patches that she has at home  4. Essential hypertension Not at goal.  Increase Norvasc to 10 mg daily - Lipid panel - Comprehensive metabolic panel - amLODipine (NORVASC) 10 MG tablet; Take 1 tablet (10 mg total) by mouth daily.  Dispense: 30 tablet; Refill: 5  5. Multiple sclerosis (HCC) Stable without neurologic complaints.  I recommend that she apply for the orange card/cone discount so that we can refer to neurology in the future.  6. Diabetes mellitus screening - Hemoglobin A1c  7. Anemia, unspecified type - CBC - Iron, TIBC and Ferritin Panel  HM: Patient told it was okay for her to get the flu shot and Tdap today if she wanted them.  However she wanted to hold off until her next visit.  Patient was given the opportunity to ask questions.  Patient verbalized understanding of the plan and was able to repeat key elements of the plan.   Orders Placed This Encounter  Procedures  . Lipid panel  . Hemoglobin A1c  . CBC  . Comprehensive metabolic panel  . Iron, TIBC and Ferritin Panel     Requested Prescriptions   Signed Prescriptions Disp Refills  . albuterol (PROVENTIL HFA;VENTOLIN HFA) 108 (90 Base) MCG/ACT inhaler 1 Inhaler 2    Sig: Inhale 2 puffs into the lungs every 6 (six) hours as needed for wheezing or shortness of breath.  Marland Kitchen amLODipine (NORVASC) 10 MG tablet 30 tablet 5    Sig: Take 1 tablet (10 mg total) by mouth daily.    Return in about 2 months (around 05/26/2018).  Karle Plumber, MD, FACP

## 2018-03-27 LAB — CBC
Hematocrit: 36.7 % (ref 34.0–46.6)
Hemoglobin: 12.3 g/dL (ref 11.1–15.9)
MCH: 30.7 pg (ref 26.6–33.0)
MCHC: 33.5 g/dL (ref 31.5–35.7)
MCV: 92 fL (ref 79–97)
PLATELETS: 285 10*3/uL (ref 150–450)
RBC: 4.01 x10E6/uL (ref 3.77–5.28)
RDW: 13.5 % (ref 12.3–15.4)
WBC: 19.4 10*3/uL — AB (ref 3.4–10.8)

## 2018-03-27 LAB — LIPID PANEL
CHOL/HDL RATIO: 1.4 ratio (ref 0.0–4.4)
Cholesterol, Total: 199 mg/dL (ref 100–199)
HDL: 140 mg/dL (ref >39)
LDL CALC: 39 mg/dL (ref 0–99)
Triglycerides: 98 mg/dL (ref 0–149)
VLDL CHOLESTEROL CAL: 20 mg/dL (ref 5–40)

## 2018-03-27 LAB — COMPREHENSIVE METABOLIC PANEL
A/G RATIO: 1.6 (ref 1.2–2.2)
ALT: 20 IU/L (ref 0–32)
AST: 24 IU/L (ref 0–40)
Albumin: 4.1 g/dL (ref 3.5–5.5)
Alkaline Phosphatase: 49 IU/L (ref 39–117)
BUN / CREAT RATIO: 20 (ref 9–23)
BUN: 14 mg/dL (ref 6–24)
Bilirubin Total: 0.3 mg/dL (ref 0.0–1.2)
CALCIUM: 9.3 mg/dL (ref 8.7–10.2)
CO2: 22 mmol/L (ref 20–29)
CREATININE: 0.69 mg/dL (ref 0.57–1.00)
Chloride: 96 mmol/L (ref 96–106)
GFR, EST AFRICAN AMERICAN: 115 mL/min/{1.73_m2} (ref >59)
GFR, EST NON AFRICAN AMERICAN: 100 mL/min/{1.73_m2} (ref >59)
Globulin, Total: 2.6 g/dL (ref 1.5–4.5)
Glucose: 83 mg/dL (ref 65–99)
POTASSIUM: 4.4 mmol/L (ref 3.5–5.2)
SODIUM: 134 mmol/L (ref 134–144)
Total Protein: 6.7 g/dL (ref 6.0–8.5)

## 2018-03-27 LAB — IRON,TIBC AND FERRITIN PANEL
Ferritin: 392 ng/mL — ABNORMAL HIGH (ref 15–150)
IRON: 70 ug/dL (ref 27–159)
Iron Saturation: 29 % (ref 15–55)
Total Iron Binding Capacity: 241 ug/dL — ABNORMAL LOW (ref 250–450)
UIBC: 171 ug/dL (ref 131–425)

## 2018-03-27 LAB — HEMOGLOBIN A1C
Est. average glucose Bld gHb Est-mCnc: 117 mg/dL
Hgb A1c MFr Bld: 5.7 % — ABNORMAL HIGH (ref 4.8–5.6)

## 2018-03-29 ENCOUNTER — Ambulatory Visit (INDEPENDENT_AMBULATORY_CARE_PROVIDER_SITE_OTHER): Payer: Self-pay | Admitting: Gastroenterology

## 2018-03-29 ENCOUNTER — Ambulatory Visit
Admission: RE | Admit: 2018-03-29 | Discharge: 2018-03-29 | Disposition: A | Discharge status: Home / Self Care | Payer: Self-pay | Source: Ambulatory Visit | Attending: Interventional Radiology | Admitting: Interventional Radiology

## 2018-03-29 ENCOUNTER — Other Ambulatory Visit: Payer: Self-pay | Admitting: Surgery

## 2018-03-29 ENCOUNTER — Other Ambulatory Visit (INDEPENDENT_AMBULATORY_CARE_PROVIDER_SITE_OTHER): Payer: Self-pay

## 2018-03-29 ENCOUNTER — Encounter: Payer: Self-pay | Admitting: Gastroenterology

## 2018-03-29 ENCOUNTER — Ambulatory Visit
Admission: RE | Admit: 2018-03-29 | Discharge: 2018-03-29 | Disposition: A | Discharge status: Home / Self Care | Payer: Self-pay | Source: Ambulatory Visit | Attending: Surgery | Admitting: Surgery

## 2018-03-29 VITALS — BP 124/76 | HR 104 | Ht 61.5 in | Wt 116.4 lb

## 2018-03-29 DIAGNOSIS — N739 Female pelvic inflammatory disease, unspecified: Secondary | ICD-10-CM

## 2018-03-29 DIAGNOSIS — K632 Fistula of intestine: Secondary | ICD-10-CM

## 2018-03-29 DIAGNOSIS — K5730 Diverticulosis of large intestine without perforation or abscess without bleeding: Secondary | ICD-10-CM

## 2018-03-29 HISTORY — PX: IR RADIOLOGIST EVAL & MGMT: IMG5224

## 2018-03-29 LAB — COMPREHENSIVE METABOLIC PANEL
ALBUMIN: 4 g/dL (ref 3.5–5.2)
ALK PHOS: 41 U/L (ref 39–117)
ALT: 17 U/L (ref 0–35)
AST: 15 U/L (ref 0–37)
BUN: 11 mg/dL (ref 6–23)
CO2: 29 mEq/L (ref 19–32)
Calcium: 9.3 mg/dL (ref 8.4–10.5)
Chloride: 96 mEq/L (ref 96–112)
Creatinine, Ser: 0.72 mg/dL (ref 0.40–1.20)
GFR: 89.84 mL/min (ref >60.00)
GLUCOSE: 115 mg/dL — AB (ref 70–99)
POTASSIUM: 4.9 meq/L (ref 3.5–5.1)
SODIUM: 131 meq/L — AB (ref 135–145)
TOTAL PROTEIN: 7.2 g/dL (ref 6.0–8.3)
Total Bilirubin: 0.3 mg/dL (ref 0.2–1.2)

## 2018-03-29 LAB — CBC WITH DIFFERENTIAL/PLATELET
BASOS ABS: 0 10*3/uL (ref 0.0–0.1)
Basophils Relative: 0.3 % (ref 0.0–3.0)
EOS PCT: 0.1 % (ref 0.0–5.0)
Eosinophils Absolute: 0 10*3/uL (ref 0.0–0.7)
HCT: 35.9 % — ABNORMAL LOW (ref 36.0–46.0)
HEMOGLOBIN: 12 g/dL (ref 12.0–15.0)
LYMPHS ABS: 0.7 10*3/uL (ref 0.7–4.0)
Lymphocytes Relative: 4.3 % — ABNORMAL LOW (ref 12.0–46.0)
MCHC: 33.5 g/dL (ref 30.0–36.0)
MCV: 95.2 fl (ref 78.0–100.0)
MONO ABS: 0.3 10*3/uL (ref 0.1–1.0)
Monocytes Relative: 1.6 % — ABNORMAL LOW (ref 3.0–12.0)
Neutro Abs: 15 10*3/uL — ABNORMAL HIGH (ref 1.4–7.7)
Platelets: 225 10*3/uL (ref 150.0–400.0)
RBC: 3.77 Mil/uL — AB (ref 3.87–5.11)
RDW: 16 % — ABNORMAL HIGH (ref 11.5–15.5)

## 2018-03-29 LAB — C-REACTIVE PROTEIN: CRP: 1 mg/dL (ref 0.5–20.0)

## 2018-03-29 MED ORDER — IOPAMIDOL (ISOVUE-300) INJECTION 61%
100.0000 mL | Freq: Once | INTRAVENOUS | Status: AC | PRN
  Administered 2018-03-29: 100 mL via INTRAVENOUS

## 2018-03-29 MED ORDER — PREDNISONE 10 MG PO TABS: ORAL_TABLET | ORAL | 0 refills | Status: DC

## 2018-03-29 MED ORDER — NA SULFATE-K SULFATE-MG SULF 17.5-3.13-1.6 GM/177ML PO SOLN: 1.0000 | Freq: Once | ORAL | 0 refills | Status: AC

## 2018-03-29 NOTE — Progress Notes (Signed)
Christina Miller    [id number]    Nov 15, 1965  Primary Care Physician:Johnson, Dalbert Batman, MD  Referring Physician: No referring provider defined for this encounter.  Chief complaint: Intra-abdominal abscess HPI: 52 year old female with intra-abdominal abscesses, enterocolonic fistula here for follow-up visit after recent hospitalization with IR drain placement on August 25.  She still has the drain, very small amount of  fluid past few days, is foul-smelling at times according to patient.  She is taking Bactrim twice daily.  She was discharged home on 60 mg prednisone daily and is currently at 50 mg daily and plan to go down to 40 mg daily tomorrow.  Denies any fever, abdominal pain, nausea, vomiting, diarrhea or blood in stool.  Her appetite has improved and she is starting to gain back weight.  She is having formed 1-2 bowel movements daily Plan for repeat CT abdomen and pelvis by IR today and she has follow-up visit after CT   Outpatient Encounter Medications as of 03/29/2018  Medication Sig  . albuterol (PROVENTIL HFA;VENTOLIN HFA) 108 (90 Base) MCG/ACT inhaler Inhale 2 puffs into the lungs every 6 (six) hours as needed for wheezing or shortness of breath.  Marland Kitchen amLODipine (NORVASC) 10 MG tablet Take 1 tablet (10 mg total) by mouth daily.  . famotidine (PEPCID) 20 MG tablet Take 1 tablet (20 mg total) by mouth 2 (two) times daily.  . nicotine (NICODERM CQ - DOSED IN MG/24 HOURS) 21 mg/24hr patch Place 1 patch (21 mg total) onto the skin daily.  Marland Kitchen sulfamethoxazole-trimethoprim (BACTRIM DS,SEPTRA DS) 800-160 MG tablet Take 1 tablet by mouth every 12 (twelve) hours.  . [DISCONTINUED] predniSONE (DELTASONE) 20 MG tablet Take 3 tablets (60 mg total) by mouth daily with breakfast for 14 days.  . [DISCONTINUED] predniSONE (DELTASONE) 20 MG tablet Take 2 tablets (40 mg total) by mouth daily for 14 days.  . Na Sulfate-K Sulfate-Mg Sulf (SUPREP BOWEL PREP KIT) 17.5-3.13-1.6 GM/177ML SOLN  Take 1 kit by mouth once for 1 dose.  . predniSONE (DELTASONE) 10 MG tablet As directed in the office for Prednisone taper  . [DISCONTINUED] ibuprofen (ADVIL,MOTRIN) 200 MG tablet Take 800 mg by mouth every 6 (six) hours as needed for moderate pain.   No facility-administered encounter medications on file as of 03/29/2018.     Allergies as of 03/29/2018 - Review Complete 03/29/2018  Allergen Reaction Noted  . Hydrocodone-acetaminophen Nausea And Vomiting 01/15/2014    Past Medical History:  Diagnosis Date  . Abnormal Pap smear    cryo  . Anemia   . Bone fracture    ankle  . COPD (chronic obstructive pulmonary disease) ([place])   . Emphysema   . GERD (gastroesophageal reflux disease)   . Headache(784.0)   . Hypertension   . Infection    urinary tract infection  . MS (multiple sclerosis) ([place])   . Pelvic abscess in female     Past Surgical History:  Procedure Laterality Date  . ARM WOUND REPAIR / CLOSURE    . BREAST BIOPSY Left 07/03/2012  . COLPOSCOPY    . DILATION AND CURETTAGE OF UTERUS    . JP DRAIN TUBE    . TUBAL LIGATION      Family History  Problem Relation Age of Onset  . Diabetes Father   . Diabetes Sister   . Hypertension Sister   . Diabetes Brother   . Hypertension Brother   . Cancer Mother   . Other  Neg Hx     Social History   Socioeconomic History  . Marital status: Legally Separated    Spouse name: Not on file  . Number of children: Not on file  . Years of education: Not on file  . Highest education level: Not on file  Occupational History  . Not on file  Social Needs  . Financial resource strain: Not on file  . Food insecurity:    Worry: Not on file    Inability: Not on file  . Transportation needs:    Medical: Not on file    Non-medical: Not on file  Tobacco Use  . Smoking status: Current Every Day Smoker    Packs/day: 1.00    Types: Cigarettes  . Smokeless tobacco: Former Network engineer and Sexual Activity  . Alcohol use: Yes     Comment: Quit Sep 02, 2016  . Drug use: No  . Sexual activity: Yes    Birth control/protection: Surgical  Lifestyle  . Physical activity:    Days per week: Not on file    Minutes per session: Not on file  . Stress: Not on file  Relationships  . Social connections:    Talks on phone: Not on file    Gets together: Not on file    Attends religious service: Not on file    Active member of club or organization: Not on file    Attends meetings of clubs or organizations: Not on file    Relationship status: Not on file  . Intimate partner violence:    Fear of current or ex partner: Not on file    Emotionally abused: Not on file    Physically abused: Not on file    Forced sexual activity: Not on file  Other Topics Concern  . Not on file  Social History Narrative   ** Merged History Encounter **          Review of systems: Review of Systems  Constitutional: Negative for fever and chills.  HENT: Negative.   Eyes: Negative for blurred vision.  Respiratory: Negative for cough, shortness of breath and wheezing.   Cardiovascular: Negative for chest pain and palpitations.  Gastrointestinal: as per HPI Genitourinary: Negative for dysuria, urgency, frequency and hematuria.  Musculoskeletal: Negative for myalgias, back pain and joint pain.  Skin: Negative for itching and rash.  Neurological: Negative for dizziness, tremors, focal weakness, seizures and loss of consciousness.  Endo/Heme/Allergies: Negative for seasonal allergies.  Psychiatric/Behavioral: Negative for depression, suicidal ideas and hallucinations.  All other systems reviewed and are negative.   Physical Exam: Vitals:   03/29/18 1044  BP: 124/76  Pulse: (!) 104   Body mass index is 21.63 kg/m. Gen:      No acute distress HEENT:  EOMI, sclera anicteric Neck:     No masses; no thyromegaly Lungs:    Clear to auscultation bilaterally; normal respiratory effort CV:         Regular rate and rhythm; no murmurs Abd:       + bowel sounds; soft, non-tender; no palpable masses, no distension Ext:    No edema; adequate peripheral perfusion Skin:      Warm and dry; no rash Neuro: alert and oriented x 3 Psych: normal mood and affect  Data Reviewed:  Reviewed labs, radiology imaging, old records and pertinent past GI work up   Assessment and Plan/Recommendations:  52 year old female with intra-abdominal abscess, enterocolonic fistula and severe sigmoid diverticular disease that is post IR drain  placement March 11, 2018 It is unclear if patient has enterocolonic fistula secondary to perforated diverticulitis resulting in intra-abdominal and pelvic abscess versus fistula arising secondary to Crohn's disease She is scheduled for repeat CT abdomen pelvis and follow-up with IR later today She will complete 2 weeks course of Bactrim tomorrow Prednisone taper down by 10 mg every week and then will taper down to 5 mg daily for 1 week and stop  Scheduled for colonoscopy next month with biopsies to evaluate the etiology for enterocolonic fistula Return in 2 weeks or sooner if needed The risks and benefits as well as alternatives of endoscopic procedure(s) have been discussed and reviewed. All questions answered. The patient agrees to proceed.   Damaris Hippo , MD [phone]    CC: No ref. provider found

## 2018-03-29 NOTE — Patient Instructions (Addendum)
Go to the basement for labs today  You have been scheduled for a colonoscopy. Please follow written instructions given to you at your visit today.  Please pick up your prep supplies at the pharmacy within the next 1-3 days. If you use inhalers (even only as needed), please bring them with you on the day of your procedure. Your physician has requested that you go to www.startemmi.com and enter the access code given to you at your visit today. This web site gives a general overview about your procedure. However, you should still follow specific instructions given to you by our office regarding your preparation for the procedure.  Continue Bactrim course  Prednisone taper :    40 mg x 1 week 30 mg x 1 week 20 mg x 1 week 10 mg x 1 week 5 mg x 1 week then disconinue  We will send prednisone to your pharmacy   Thank you for choosing [facility]  Karleen Hampshire Nand[place],MD

## 2018-03-29 NOTE — Progress Notes (Signed)
Chief Complaint: Patient was seen in consultation today for follow up of intraabdominal abscess drain  Referring Physician(s): Gross, Remo Lipps  Supervising Physician: Jacqulynn Cadet  History of Present Illness: Christina Miller is a 52 y.o. female who presented to [facility] ED on 8/23 due to abdominal pain - it was subsequently determined that she had a pelvic abscess which required drain placement in IR on 8/24. 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 She was discharged from the hospital with drain in place on 8/29 and presents today for follow-up CT and drain injection. She continues to be followed by GI with most recent appointment earlier today. She is also followed by Dr. Johney Maine with next follow-up 9/17.  She states she has been flushing daily with minimal output. She denies any fever, chills, abdominal pain, nausea or vomiting. She has been using gravity bag without issue.   Past Medical History:  Diagnosis Date  . Abnormal Pap smear    cryo  . Anemia   . Bone fracture    ankle  . COPD (chronic obstructive pulmonary disease) ([place])   . Emphysema   . GERD (gastroesophageal reflux disease)   . Headache(784.0)   . Hypertension   . Infection    urinary tract infection  . MS (multiple sclerosis) ([place])   . Pelvic abscess in female     Past Surgical History:  Procedure Laterality Date  . ARM WOUND REPAIR / CLOSURE    . BREAST BIOPSY Left 07/03/2012  . COLPOSCOPY    . DILATION AND CURETTAGE OF UTERUS    . JP DRAIN TUBE    . TUBAL LIGATION      Allergies: Hydrocodone-acetaminophen  Medications: Prior to Admission medications   Medication Sig Start Date End Date Taking? Authorizing Provider  albuterol (PROVENTIL HFA;VENTOLIN HFA) 108 (90 Base) MCG/ACT inhaler Inhale 2 puffs into the lungs every 6 (six) hours as needed for wheezing or shortness of breath. 03/26/18   Ladell Pier, MD  amLODipine  (NORVASC) 10 MG tablet Take 1 tablet (10 mg total) by mouth daily. 03/26/18   Ladell Pier, MD  famotidine (PEPCID) 20 MG tablet Take 1 tablet (20 mg total) by mouth 2 (two) times daily. 03/15/18   Charlynne Cousins, MD  Na Sulfate-K Sulfate-Mg Sulf (SUPREP BOWEL PREP KIT) 17.5-3.13-1.6 GM/177ML SOLN Take 1 kit by mouth once for 1 dose. 03/29/18 03/29/18  [name], MD  nicotine (NICODERM CQ - DOSED IN MG/24 HOURS) 21 mg/24hr patch Place 1 patch (21 mg total) onto the skin daily. 03/16/18   Charlynne Cousins, MD  predniSONE (DELTASONE) 10 MG tablet As directed in the office for Prednisone taper 03/29/18   [name], MD  sulfamethoxazole-trimethoprim (BACTRIM DS,SEPTRA DS) 800-160 MG tablet Take 1 tablet by mouth every 12 (twelve) hours. 03/15/18   Charlynne Cousins, MD     Family History  Problem Relation Age of Onset  . Diabetes Father   . Diabetes Sister   . Hypertension Sister   . Diabetes Brother   . Hypertension Brother   . Cancer Mother   . Other Neg Hx     Social History   Socioeconomic History  . Marital status: Legally Separated    Spouse name: Not on file  . Number of children: Not on file  . Years of education: Not on file  . Highest education level: Not on file  Occupational History  .  Not on file  Social Needs  . Financial resource strain: Not on file  . Food insecurity:    Worry: Not on file    Inability: Not on file  . Transportation needs:    Medical: Not on file    Non-medical: Not on file  Tobacco Use  . Smoking status: Current Every Day Smoker    Packs/day: 1.00    Types: Cigarettes  . Smokeless tobacco: Former Network engineer and Sexual Activity  . Alcohol use: Yes    Comment: Quit Sep 02, 2016  . Drug use: No  . Sexual activity: Yes    Birth control/protection: Surgical  Lifestyle  . Physical activity:    Days per week: Not on file    Minutes per session: Not on file  . Stress: Not on file  Relationships  . Social  connections:    Talks on phone: Not on file    Gets together: Not on file    Attends religious service: Not on file    Active member of club or organization: Not on file    Attends meetings of clubs or organizations: Not on file    Relationship status: Not on file  Other Topics Concern  . Not on file  Social History Narrative   ** Merged History Encounter **        Review of Systems: A 12 point ROS discussed and pertinent positives are indicated in the HPI above.  All other systems are negative.  Review of Systems  Constitutional: Negative for chills, fatigue and fever.  Respiratory: Negative for shortness of breath.   Cardiovascular: Negative for chest pain.  Gastrointestinal: Negative for abdominal distention, abdominal pain, constipation, diarrhea, nausea and vomiting.  Neurological: Negative for syncope.    Vital Signs: BP 133/83   Pulse (!) 101   Temp 98.4 F (36.9 C)   LMP 06/20/2012   SpO2 95%   Physical Exam  Constitutional: She is oriented to person, place, and time. No distress.  HENT:  Head: Normocephalic.  Cardiovascular: Normal rate, regular rhythm and normal heart sounds.  Pulmonary/Chest: Effort normal and breath sounds normal.  Abdominal: Soft. She exhibits no distension. There is no tenderness.  Neurological: She is alert and oriented to person, place, and time.  Skin: Skin is warm and dry. She is not diaphoretic.  Psychiatric: She has a normal mood and affect. Her behavior is normal. Judgment and thought content normal.  Vitals reviewed.   Imaging: Ct Abdomen Pelvis W Contrast  Result Date: 03/14/2018 CLINICAL DATA:  Pelvic abscess, status post percutaneous drain 03/10/2018 EXAM: CT ABDOMEN AND PELVIS WITH CONTRAST TECHNIQUE: Multidetector CT imaging of the abdomen and pelvis was performed using the standard protocol following bolus administration of intravenous contrast. CONTRAST:  33m OMNIPAQUE IOHEXOL 300 MG/ML  SOLN COMPARISON:  03/10/2018  FINDINGS: Lower chest: No acute abnormality. Hepatobiliary: No focal liver abnormality is seen. No gallstones, gallbladder wall thickening, or biliary dilatation. Pancreas: Unremarkable. No pancreatic ductal dilatation or surrounding inflammatory changes. Spleen: Normal in size without focal abnormality. Adrenals/Urinary Tract: Adrenal glands are unremarkable. Kidneys are normal, without renal calculi, focal lesion, or hydronephrosis. Bladder is unremarkable. Stomach/Bowel: Negative for bowel obstruction, significant dilatation, ileus, or free air. Left lower quadrant anterior pelvic drain catheter is stable in position. There is a persistent fluid collection at the drain catheter site containing enteric contrast. This collection measures 5.5 x 2.4 cm, image 58 series 2. Findings suspicious for adjacent fistulas to the sigmoid colon and the adjacent  ileum. Adjacent wall thickening persist of the sigmoid colon. Diverticular disease also evident. No new fluid collections within the abdomen or pelvis. No free fluid or ascites. Vascular/Lymphatic: Aortic atherosclerosis noted. No aneurysm or dissection. Mesenteric and renal vasculature appear patent. Iliac vessels are patent. No adenopathy. Reproductive: Uterus and adnexa normal in size. Other: No abdominal wall hernia or abnormality. No abdominopelvic ascites. Musculoskeletal: Minor lumbar spine degenerative changes. No acute osseous finding. IMPRESSION: Interval placement of a anterior left pelvic drain catheter. 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 Minimal improvement in the pelvic inflammation. Wall thickening persist of the sigmoid colon with diverticular disease noted. Appearance suggest some improvement in the acute diverticulitis/colitis. No new abdominal or pelvic fluid collections. Negative for obstruction, ileus or free air.  Electronically Signed   By: Jerilynn Mages.  Shick M.D.   On: 03/14/2018 13:20   Ct Abdomen Pelvis W Contrast  Result Date: 03/09/2018 CLINICAL DATA:  52 year old female with history of 46 weeks of abdominal cramping, nausea, vomiting and diarrhea. Lethargy. Possible history of diverticulosis. Possible history of Crohn's disease. EXAM: CT ABDOMEN AND PELVIS WITH CONTRAST TECHNIQUE: Multidetector CT imaging of the abdomen and pelvis was performed using the standard protocol following bolus administration of intravenous contrast. CONTRAST:  138m ISOVUE-300 IOPAMIDOL (ISOVUE-300) INJECTION 61% COMPARISON:  CT the abdomen and pelvis 02/23/2018. FINDINGS: Lower chest: Unremarkable. Hepatobiliary: No cystic or solid hepatic lesions. No intra or extrahepatic biliary ductal dilatation. Gallbladder is normal in appearance. Pancreas: No pancreatic mass. No pancreatic ductal dilatation. No pancreatic or peripancreatic fluid or inflammatory changes. Spleen: Unremarkable. Adrenals/Urinary Tract: Bilateral kidneys and adrenal glands are normal in appearance. There is no hydroureteronephrosis. Urinary bladder is normal in appearance. Stomach/Bowel: Normal appearance of the stomach. No pathologic dilatation of small bowel or colon. When compared to the prior examination, the previously noted mural thickening and luminal narrowing associated with the distal and terminal ileum has resolved. There continues to be some mural thickening and mucosal hyperenhancement with luminal narrowing in the proximal ileum or distal jejunum, best appreciated on axial image 55 of series 2 and coronal image 40 of series 6. There is also severe thickening of the sigmoid colon. Adjacent to the sigmoid colon and intimately associated with adjacent loops of small bowel (best appreciated on coronal images 42-67 of series 6) there are again complex extraluminal collections of what appears to be feculent material extending between portions of sigmoid colon and  adjacent loops of mid to distal small bowel, likely to reflect the presence of enteroenteric and/or enterocolonic fistulae. These fistulous tracts and the surrounding inflammation appears more extensive than the prior examination. These tracts are highly irregular in shape and therefore difficult to accurately measure, however, the largest of these currently measures approximately 6.3 x 4.8 x 4.8 cm (axial image 62 of series 2 and coronal image 52 of series 6). In addition, there is a rim enhancing fluid collection in the low left anatomic pelvis (axial image 66 of series 2 and coronal image 46 of series 6) which currently measures 2.2 x 1.8 x 1.7 cm, compatible with a small abscess. Normal appendix. Vascular/Lymphatic: Aortic atherosclerosis, without evidence of aneurysm or dissection in the abdominal or pelvic vasculature. No lymphadenopathy noted in the abdomen or pelvis. Reproductive: Uterus and ovaries are unremarkable in appearance. Other: Trace volume of ascites.  No pneumoperitoneum. Musculoskeletal: No aggressive appearing lytic or blastic lesions are noted in the visualized portions  of the skeleton. IMPRESSION: 1. Although there has been resolution of the inflammatory changes involving the distal and terminal ileum, there continues to be extensive areas of mural thickening involving mid to distal small bowel, as well as the sigmoid colon. In the low anatomic pelvis there are worsening areas which appear to reflect enteroenteric and/or enterocolonic fistulae, as well as a developing abscess in the low left hemipelvis, as detailed above. Surgical consultation is recommended. 2. Aortic atherosclerosis. 3. Additional incidental findings, as above. These results will be called to the ordering clinician or representative by the Radiologist Assistant, and communication documented in the PACS or zVision Dashboard. Electronically Signed   By: Vinnie Langton M.D.   On: 03/09/2018 11:24   Ct Image Guided Drainage  By Percutaneous Catheter  Result Date: 03/10/2018 INDICATION: 52 year old with an abdominopelvic abscess. Concern for bowel fistula on previous imaging. EXAM: CT GUIDED DRAINAGE OF ABDOMINOPELVIC ABSCESS MEDICATIONS: The patient is currently admitted to the hospital and receiving intravenous antibiotics. ANESTHESIA/SEDATION: 4.0 mg IV Versed 100 mcg IV Fentanyl Moderate Sedation Time:  39 minutes The patient was continuously monitored during the procedure by the interventional radiology nurse under my direct supervision. COMPLICATIONS: None immediate. TECHNIQUE: Informed written consent was obtained from the patient after a thorough discussion of the procedural risks, benefits and alternatives. All questions were addressed. A timeout was performed prior to the initiation of the procedure. PROCEDURE: Patient was placed supine on the CT scanner. Images through the lower abdomen and pelvis were obtained. The area of concern in the left pelvic region was identified. The overlying skin was prepped and draped in sterile fashion. Skin and soft tissues were anesthetized with 1% lidocaine. 62 gauge trocar needle was directed into the abscess with CT guidance. Thick purulent fluid was aspirated from the needle. A stiff Amplatz wire was advanced into the collection. The tract was dilated to accommodate a 10 [place] multipurpose drain. Approximately 5 mL of thick purulent fluid was removed from the collection. Drain was sutured to skin. FINDINGS: Again noted is an abscess collection in the left lower abdomen and pelvic region. There is now oral contrast within the abscess and there is concern for a fistula connection between small bowel and sigmoid colon on sequence 2, image 28. Thick purulent fluid was removed from the collection. Drain position confirmed within the collection. IMPRESSION: CT-guided placement of a drainage catheter within the abdominopelvic abscess collection. CT images raise concern for fistula connections  between small bowel, sigmoid colon and the abscess. Electronically Signed   By: Markus Daft M.D.   On: 03/10/2018 13:11    Labs:  CBC: Recent Labs    03/14/18 0523 03/15/18 0356 03/26/18 1026 03/29/18 1140  WBC 9.0 9.4 19.4* 16.0 Manual smear review agrees with instrument differential.*  HGB 11.5* 11.1* 12.3 12.0  HCT 35.2* 34.9* 36.7 35.9*  PLT 310 275 285 225.0    COAGS: Recent Labs    03/10/18 0620  INR 0.96  APTT 26    BMP: Recent Labs    03/13/18 0515 03/14/18 0523 03/15/18 0356 03/26/18 1026 03/29/18 1140  NA 141 138 141 134 131*  K 4.6 4.1 4.0 4.4 4.9  CL 105 102 105 96 96  CO2 _0 GLUCOSE 131* 142* 109* 83 115*  BUN 5* _1 CALCIUM 8.9 9.1 8.5* 9.3 9.3  CREATININE 0.54 0.58 0.55 0.69 0.72  GFRNONAA >60 >60 >60 100  --   GFRAA >60 >60 >60 115  --  LIVER FUNCTION TESTS: Recent Labs    03/05/18 1446 03/09/18 1315 03/26/18 1026 03/29/18 1140  BILITOT 0.6 0.7 0.3 0.3  AST _0 ALT _1 ALKPHOS 62 45 49 41  PROT 7.1 6.5 6.7 7.2  ALBUMIN 3.6 3.1* 4.1 4.0    TUMOR MARKERS: No results for input(s): AFPTM, CEA, CA199, CHROMGRNA in the last 8760 hours.  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 She was d/ced to home on 8/29 with drain in place - she has been flushing drain daily with minimal output and continues with gravity bag. She denies any fever or chills.  Patient presents today for follow-up CT and drain injection in IR clinic which shows residual fluid collection where catheter tip is located as well as ongoing enterocolonic fistula. Images reviewed with Dr. Laurence Ferrari today who recommends upsizing of drain for residual fluid collection - will have IR scheduler plan for drain upsize with possible sedation Monday or Tuesday next week at [facility] or [facility].   Electronically Signed: Joaquim Nam  PA-C 03/29/2018, 2:10 PM   Please refer to Dr. Laurence Ferrari attestation of this note for management and plan.

## 2018-03-30 ENCOUNTER — Telehealth: Payer: Self-pay | Admitting: Nurse Practitioner

## 2018-03-30 ENCOUNTER — Other Ambulatory Visit: Payer: Self-pay

## 2018-03-30 DIAGNOSIS — K632 Fistula of intestine: Secondary | ICD-10-CM

## 2018-03-30 MED ORDER — SULFAMETHOXAZOLE-TRIMETHOPRIM 800-160 MG PO TABS: 1.0000 | ORAL_TABLET | Freq: Two times a day (BID) | ORAL | 0 refills | Status: DC

## 2018-04-02 ENCOUNTER — Other Ambulatory Visit: Payer: Self-pay | Admitting: Student

## 2018-04-03 ENCOUNTER — Encounter (HOSPITAL_COMMUNITY): Payer: Self-pay

## 2018-04-03 ENCOUNTER — Ambulatory Visit (HOSPITAL_COMMUNITY)
Admission: RE | Admit: 2018-04-03 | Discharge: 2018-04-03 | Disposition: A | Discharge status: Home / Self Care | Payer: Medicaid Other | Source: Ambulatory Visit | Attending: Surgery | Admitting: Surgery

## 2018-04-03 DIAGNOSIS — G35 Multiple sclerosis: Secondary | ICD-10-CM | POA: Diagnosis not present

## 2018-04-03 DIAGNOSIS — K219 Gastro-esophageal reflux disease without esophagitis: Secondary | ICD-10-CM | POA: Diagnosis not present

## 2018-04-03 DIAGNOSIS — Z885 Allergy status to narcotic agent status: Secondary | ICD-10-CM | POA: Insufficient documentation

## 2018-04-03 DIAGNOSIS — I10 Essential (primary) hypertension: Secondary | ICD-10-CM | POA: Diagnosis not present

## 2018-04-03 DIAGNOSIS — Z539 Procedure and treatment not carried out, unspecified reason: Secondary | ICD-10-CM | POA: Diagnosis not present

## 2018-04-03 DIAGNOSIS — F17210 Nicotine dependence, cigarettes, uncomplicated: Secondary | ICD-10-CM | POA: Diagnosis not present

## 2018-04-03 DIAGNOSIS — J449 Chronic obstructive pulmonary disease, unspecified: Secondary | ICD-10-CM | POA: Insufficient documentation

## 2018-04-03 DIAGNOSIS — K651 Peritoneal abscess: Secondary | ICD-10-CM | POA: Diagnosis not present

## 2018-04-03 DIAGNOSIS — N739 Female pelvic inflammatory disease, unspecified: Secondary | ICD-10-CM

## 2018-04-03 LAB — PROTIME-INR
INR: 0.87
Prothrombin Time: 11.8 seconds (ref 11.4–15.2)

## 2018-04-03 LAB — APTT: APTT: 24 s (ref 24–36)

## 2018-04-03 LAB — CBC
HCT: 38.3 % (ref 36.0–46.0)
Hemoglobin: 12.3 g/dL (ref 12.0–15.0)
MCH: 31.5 pg (ref 26.0–34.0)
MCHC: 32.1 g/dL (ref 30.0–36.0)
MCV: 98 fL (ref 78.0–100.0)
PLATELETS: 232 10*3/uL (ref 150–400)
RBC: 3.91 MIL/uL (ref 3.87–5.11)
RDW: 15.9 % — AB (ref 11.5–15.5)
WBC: 10.5 10*3/uL (ref 4.0–10.5)

## 2018-04-03 MED ORDER — SODIUM CHLORIDE 0.9 % IV SOLN: INTRAVENOUS | Status: DC

## 2018-04-03 NOTE — H&P (Signed)
Chief Complaint: Patient was seen in consultation today for left low quadrant abscess drain exchange/upsize at the request of [place]  Referring Physician(s): [place]  Supervising Physician: Corrie Mckusick  Patient Status: [facility] - Out-pt  History of Present Illness: Christina Miller is a 52 y.o. female   Intra abdominal abscess drain placed 8/24 Was seen in [facility] 9/12:   Dr Laurence Ferrari note 03/29/2018: Assessment and Plan: Persistent abscess cavity on CT imaging with a fistulous communication between the drainage catheter in the adjacent rectum on catheter injection.  Patient has almost no output from the drainage catheter suggesting that it is undersized. 1.)  Will schedule for drain exchange and upsize. 2.)  Repeat CT scan of the pelvis with intravenous contrast and catheter injection 2 weeks after the drain has been upsized.  Scheduled now for upsize/exchange of drain catheter   Past Medical History:  Diagnosis Date  . Abnormal Pap smear    cryo  . Anemia   . Bone fracture    ankle  . COPD (chronic obstructive pulmonary disease) ([place])   . Emphysema   . GERD (gastroesophageal reflux disease)   . Headache(784.0)   . Hypertension   . Infection    urinary tract infection  . MS (multiple sclerosis) ([place])   . Pelvic abscess in female     Past Surgical History:  Procedure Laterality Date  . ARM WOUND REPAIR / CLOSURE    . BREAST BIOPSY Left 07/03/2012  . COLPOSCOPY    . DILATION AND CURETTAGE OF UTERUS    . IR RADIOLOGIST EVAL & MGMT  03/29/2018  . JP DRAIN TUBE    . TUBAL LIGATION      Allergies: Hydrocodone-acetaminophen  Medications: Prior to Admission medications   Medication Sig Start Date End Date Taking? Authorizing Provider  amLODipine (NORVASC) 10 MG tablet Take 1 tablet (10 mg total) by mouth daily. 03/26/18  Yes Ladell Pier, MD  famotidine (PEPCID) 20 MG tablet Take 1 tablet (20 mg total) by mouth 2 (two) times daily. 03/15/18   Yes Charlynne Cousins, MD  nicotine (NICODERM CQ - DOSED IN MG/24 HOURS) 21 mg/24hr patch Place 1 patch (21 mg total) onto the skin daily. 03/16/18  Yes Charlynne Cousins, MD  predniSONE (DELTASONE) 10 MG tablet As directed in the office for Prednisone taper 03/29/18  Yes Nandigam, Venia Minks, MD  sulfamethoxazole-trimethoprim (BACTRIM DS,SEPTRA DS) 800-160 MG tablet Take 1 tablet by mouth every 12 (twelve) hours. 03/30/18  Yes Nandigam, Venia Minks, MD  albuterol (PROVENTIL HFA;VENTOLIN HFA) 108 (90 Base) MCG/ACT inhaler Inhale 2 puffs into the lungs every 6 (six) hours as needed for wheezing or shortness of breath. 03/26/18   Ladell Pier, MD     Family History  Problem Relation Age of Onset  . Diabetes Father   . Diabetes Sister   . Hypertension Sister   . Diabetes Brother   . Hypertension Brother   . Cancer Mother   . Other Neg Hx     Social History   Socioeconomic History  . Marital status: Legally Separated    Spouse name: Not on file  . Number of children: Not on file  . Years of education: Not on file  . Highest education level: Not on file  Occupational History  . Not on file  Social Needs  . Financial resource strain: Not on file  . Food insecurity:    Worry: Not on file    Inability: Not on file  .  Transportation needs:    Medical: Not on file    Non-medical: Not on file  Tobacco Use  . Smoking status: Current Every Day Smoker    Packs/day: 1.00    Types: Cigarettes  . Smokeless tobacco: Former Network engineer and Sexual Activity  . Alcohol use: Yes    Comment: Quit Sep 02, 2016  . Drug use: No  . Sexual activity: Yes    Birth control/protection: Surgical  Lifestyle  . Physical activity:    Days per week: Not on file    Minutes per session: Not on file  . Stress: Not on file  Relationships  . Social connections:    Talks on phone: Not on file    Gets together: Not on file    Attends religious service: Not on file    Active member of club or  organization: Not on file    Attends meetings of clubs or organizations: Not on file    Relationship status: Not on file  Other Topics Concern  . Not on file  Social History Narrative   ** Merged History Encounter **       Review of Systems: A 12 point ROS discussed and pertinent positives are indicated in the HPI above.  All other systems are negative.  Review of Systems  Constitutional: Negative for appetite change and fever.  Respiratory: Negative for shortness of breath.   Cardiovascular: Negative for chest pain.  Gastrointestinal: Negative for abdominal pain.  Neurological: Negative for weakness.  Psychiatric/Behavioral: Negative for behavioral problems and confusion.    Vital Signs: BP 132/90   Pulse 97   Temp 98.7 F (37.1 C)   Ht 5' 1.5" (1.562 m)   Wt 116 lb (52.6 kg)   LMP 06/20/2012   SpO2 93%   BMI 21.56 kg/m   Physical Exam  Constitutional: She is oriented to person, place, and time.  Cardiovascular: Normal rate, regular rhythm and normal heart sounds.  Pulmonary/Chest: Effort normal and breath sounds normal.  Musculoskeletal: Normal range of motion.  Neurological: She is alert and oriented to person, place, and time.  Skin: Skin is warm and dry.  Psychiatric: She has a normal mood and affect. Her behavior is normal. Judgment and thought content normal.  Vitals reviewed.   Imaging: Ct Pelvis W Contrast  Result Date: 03/29/2018 CLINICAL DATA:  52 year old female with chronic abdominal cramping, nausea, vomiting and diarrhea. She was diagnosed with possible bowel perforation versus fistula and deep pelvic abscess formation on 03/09/2018 and was subsequently treated by percutaneous drain placement on 03/10/2018. She presents today for follow-up CT imaging. Patient is currently feeling better and drain output is minimal. EXAM: CT PELVIS WITH CONTRAST TECHNIQUE: Multidetector CT imaging of the pelvis was performed using the standard protocol following the bolus  administration of intravenous contrast. CONTRAST:  168m ISOVUE-300 IOPAMIDOL (ISOVUE-300) INJECTION 61% COMPARISON:  Most recent prior CT abdomen/pelvis 03/14/2018 FINDINGS: The percutaneous drainage catheter remains in good position. There is persistent flocculent fluid and gas surrounding the pigtail of the drainage catheter. The collection measures approximately 3.9 x 2.3 x 2.5 cm. The degree of inflammatory wall thickening of the adjacent sigmoid colon has improved. Scattered colonic diverticula are again visualized. No new or undrained abscess collection identified. Atherosclerotic vascular Kacie inside dental fide in the distal abdominal aorta. The visualized pelvis, uterus and adnexa are unremarkable. No suspicious lymphadenopathy. No evidence of osseous lesion. IMPRESSION: Persistent 3.9 x 2.3 x 2.5 cm flocculent fluid and gas collection surrounding the previously  placed percutaneous drainage catheter. The drainage catheter remains well positioned. Improving inflammatory changes involving the adjacent sigmoid colon. Signed, Criselda Peaches, MD, [facility] Radiology Electronically Signed   By: Jacqulynn Cadet M.D.   On: 03/29/2018 16:54   Ct Abdomen Pelvis W Contrast  Result Date: 03/14/2018 CLINICAL DATA:  Pelvic abscess, status post percutaneous drain 03/10/2018 EXAM: CT ABDOMEN AND PELVIS WITH CONTRAST TECHNIQUE: Multidetector CT imaging of the abdomen and pelvis was performed using the standard protocol following bolus administration of intravenous contrast. CONTRAST:  29m OMNIPAQUE IOHEXOL 300 MG/ML  SOLN COMPARISON:  03/10/2018 FINDINGS: Lower chest: No acute abnormality. Hepatobiliary: No focal liver abnormality is seen. No gallstones, gallbladder wall thickening, or biliary dilatation. Pancreas: Unremarkable. No pancreatic ductal dilatation or surrounding inflammatory changes. Spleen: Normal in size without focal abnormality.  Adrenals/Urinary Tract: Adrenal glands are unremarkable. Kidneys are normal, without renal calculi, focal lesion, or hydronephrosis. Bladder is unremarkable. Stomach/Bowel: Negative for bowel obstruction, significant dilatation, ileus, or free air. Left lower quadrant anterior pelvic drain catheter is stable in position. There is a persistent fluid collection at the drain catheter site containing enteric contrast. This collection measures 5.5 x 2.4 cm, image 58 series 2. Findings suspicious for adjacent fistulas to the sigmoid colon and the adjacent ileum. Adjacent wall thickening persist of the sigmoid colon. Diverticular disease also evident. No new fluid collections within the abdomen or pelvis. No free fluid or ascites. Vascular/Lymphatic: Aortic atherosclerosis noted. No aneurysm or dissection. Mesenteric and renal vasculature appear patent. Iliac vessels are patent. No adenopathy. Reproductive: Uterus and adnexa normal in size. Other: No abdominal wall hernia or abnormality. No abdominopelvic ascites. Musculoskeletal: Minor lumbar spine degenerative changes. No acute osseous finding. IMPRESSION: Interval placement of a anterior left pelvic drain catheter. 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 Minimal improvement in the pelvic inflammation. Wall thickening persist of the sigmoid colon with diverticular disease noted. Appearance suggest some improvement in the acute diverticulitis/colitis. No new abdominal or pelvic fluid collections. Negative for obstruction, ileus or free air. Electronically Signed   By: MJerilynn Mages  Shick M.D.   On: 03/14/2018 13:20   Ct Abdomen Pelvis W Contrast  Result Date: 03/09/2018 CLINICAL DATA:  52year old female with history of 46 weeks of abdominal cramping, nausea, vomiting and diarrhea. Lethargy. Possible history of diverticulosis. Possible history of Crohn's  disease. EXAM: CT ABDOMEN AND PELVIS WITH CONTRAST TECHNIQUE: Multidetector CT imaging of the abdomen and pelvis was performed using the standard protocol following bolus administration of intravenous contrast. CONTRAST:  1048mISOVUE-300 IOPAMIDOL (ISOVUE-300) INJECTION 61% COMPARISON:  CT the abdomen and pelvis 02/23/2018. FINDINGS: Lower chest: Unremarkable. Hepatobiliary: No cystic or solid hepatic lesions. No intra or extrahepatic biliary ductal dilatation. Gallbladder is normal in appearance. Pancreas: No pancreatic mass. No pancreatic ductal dilatation. No pancreatic or peripancreatic fluid or inflammatory changes. Spleen: Unremarkable. Adrenals/Urinary Tract: Bilateral kidneys and adrenal glands are normal in appearance. There is no hydroureteronephrosis. Urinary bladder is normal in appearance. Stomach/Bowel: Normal appearance of the stomach. No pathologic dilatation of small bowel or colon. When compared to the prior examination, the previously noted mural thickening and luminal narrowing associated with the distal and terminal ileum has resolved. There continues to be some mural thickening and mucosal hyperenhancement with luminal narrowing in the proximal ileum or distal jejunum, best appreciated on axial image 55 of series 2 and coronal image 40 of series  6. There is also severe thickening of the sigmoid colon. 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 These fistulous tracts and the surrounding inflammation appears more extensive than the prior examination. These tracts are highly irregular in shape and therefore difficult to accurately measure,  however, the largest of these currently measures approximately 6.3 x 4.8 x 4.8 cm (axial image 62 of series 2 and coronal image 52 of series 6). In addition, there is a rim enhancing fluid collection in the low left anatomic pelvis (axial image 66 of series 2 and coronal image 46 of series 6) which currently measures 2.2 x 1.8 x 1.7 cm, compatible with a small abscess. Normal appendix. Vascular/Lymphatic: Aortic atherosclerosis, without evidence of aneurysm or dissection in the abdominal or pelvic vasculature. No lymphadenopathy noted in the abdomen or pelvis. Reproductive: Uterus and ovaries are unremarkable in appearance. Other: Trace volume of ascites.  No pneumoperitoneum. Musculoskeletal: No aggressive appearing lytic or blastic lesions are noted in the visualized portions of the skeleton. IMPRESSION: 1. Although there has been resolution of the inflammatory changes involving the distal and terminal ileum, there continues to be extensive areas of mural thickening involving mid to distal small bowel, as well as the sigmoid colon. In the low anatomic pelvis there are worsening areas which appear to reflect enteroenteric and/or enterocolonic fistulae, as well as a developing abscess in the low left hemipelvis, as detailed above. Surgical consultation is recommended. 2. Aortic atherosclerosis. 3. Additional incidental findings, as above. These results will be called to the ordering clinician or representative by the Radiologist Assistant, and communication documented in the PACS or zVision Dashboard. Electronically Signed   By: Vinnie Langton M.D.   On: 03/09/2018 11:24   Dg Sinus/fist Tube Chk-non Gi  Result Date: 03/29/2018 INDICATION: 52 year old female with suspected Crohn's disease and intra-abdominal abscess. CT imaging today demonstrates a persistent abscess collection surrounding the previously placed drainage catheter. She presents now for contrast injection to evaluate for fistula. EXAM: Drain  injection MEDICATIONS: None ANESTHESIA/SEDATION: None COMPLICATIONS: None immediate. PROCEDURE: Informed written consent was obtained from the patient after a thorough discussion of the procedural risks, benefits and alternatives. All questions were addressed. A timeout was performed prior to the initiation of the procedure. Initial images demonstrate a well-positioned drainage catheter. Following injection of iodinated contrast, there is initial filling of the residual abscess cavity followed by opacification of the adjacent sigmoid colon. Contrast material then proceeds distally into the rectum. IMPRESSION: Positive for fistulous communication with the sigmoid colon. Electronically Signed   By: Jacqulynn Cadet M.D.   On: 03/29/2018 16:55   Ct Image Guided Drainage By Percutaneous Catheter  Result Date: 03/10/2018 INDICATION: 52 year old with an abdominopelvic abscess. Concern for bowel fistula on previous imaging. EXAM: CT GUIDED DRAINAGE OF ABDOMINOPELVIC ABSCESS MEDICATIONS: The patient is currently admitted to the hospital and receiving intravenous antibiotics. ANESTHESIA/SEDATION: 4.0 mg IV Versed 100 mcg IV Fentanyl Moderate Sedation Time:  39 minutes The patient was continuously monitored during the procedure by the interventional radiology nurse under my direct supervision. COMPLICATIONS: None immediate. TECHNIQUE: Informed written consent was obtained from the patient after a thorough discussion of the procedural risks, benefits and alternatives. All questions  were addressed. A timeout was performed prior to the initiation of the procedure. PROCEDURE: Patient was placed supine on the CT scanner. Images through the lower abdomen and pelvis were obtained. The area of concern in the left pelvic region was identified. The overlying skin was prepped and draped in sterile fashion. Skin and soft tissues were anesthetized with 1% lidocaine. 72 gauge trocar needle was directed into the abscess with CT  guidance. Thick purulent fluid was aspirated from the needle. A stiff Amplatz wire was advanced into the collection. The tract was dilated to accommodate a 10 [place] multipurpose drain. Approximately 5 mL of thick purulent fluid was removed from the collection. Drain was sutured to skin. FINDINGS: Again noted is an abscess collection in the left lower abdomen and pelvic region. There is now oral contrast within the abscess and there is concern for a fistula connection between small bowel and sigmoid colon on sequence 2, image 28. Thick purulent fluid was removed from the collection. Drain position confirmed within the collection. IMPRESSION: CT-guided placement of a drainage catheter within the abdominopelvic abscess collection. CT images raise concern for fistula connections between small bowel, sigmoid colon and the abscess. Electronically Signed   By: Markus Daft M.D.   On: 03/10/2018 13:11   Ir Radiologist Eval & Mgmt  Result Date: 03/29/2018 Please refer to notes tab for details about interventional procedure. (Op Note)   Labs:  CBC: Recent Labs    03/14/18 0523 03/15/18 0356 03/26/18 1026 03/29/18 1140  WBC 9.0 9.4 19.4* 16.0 Manual smear review agrees with instrument differential.*  HGB 11.5* 11.1* 12.3 12.0  HCT 35.2* 34.9* 36.7 35.9*  PLT 310 275 285 225.0    COAGS: Recent Labs    03/10/18 0620  INR 0.96  APTT 26    BMP: Recent Labs    03/13/18 0515 03/14/18 0523 03/15/18 0356 03/26/18 1026 03/29/18 1140  NA 141 138 141 134 131*  K 4.6 4.1 4.0 4.4 4.9  CL 105 102 105 96 96  CO2 29 30 29 22 29   GLUCOSE 131* 142* 109* 83 115*  BUN 5* 10 11 14 11   CALCIUM 8.9 9.1 8.5* 9.3 9.3  CREATININE 0.54 0.58 0.55 0.69 0.72  GFRNONAA >60 >60 >60 100  --   GFRAA >60 >60 >60 115  --     LIVER FUNCTION TESTS: Recent Labs    03/05/18 1446 03/09/18 1315 03/26/18 1026 03/29/18 1140  BILITOT 0.6 0.7 0.3 0.3  AST 23 25 24 15   ALT 20 17 20 17   ALKPHOS 62 45 49 41  PROT  7.1 6.5 6.7 7.2  ALBUMIN 3.6 3.1* 4.1 4.0    TUMOR MARKERS: No results for input(s): AFPTM, CEA, CA199, CHROMGRNA in the last 8760 hours.  Assessment and Plan:  Abscess drain placed 8/24 Follow up in IR clinic revealed CT showing persistent fluid and gas collection Drain injection + fistula connection to bowel Now scheduled for drain upsize and exchange Pt is aware of procedure benefits and risk including but limited to Infection; bleeding; damage to surrounding structures Agreeable to proceed Consent signed and in chart  Thank you for this interesting consult.  I greatly enjoyed meeting Christina Miller and look forward to participating in their care.  A copy of this report was sent to the requesting provider on this date.  Electronically Signed: Lavonia Drafts, PA-C 04/03/2018, 1:05 PM   I spent a total of    25 Minutes in face to face in clinical consultation, greater  than 50% of which was counseling/coordinating care for abscess drain exchange

## 2018-04-04 ENCOUNTER — Other Ambulatory Visit: Payer: Self-pay | Admitting: Student

## 2018-04-04 ENCOUNTER — Ambulatory Visit (HOSPITAL_COMMUNITY)
Admission: RE | Admit: 2018-04-04 | Discharge: 2018-04-04 | Disposition: A | Discharge status: Home / Self Care | Payer: Medicaid Other | Source: Ambulatory Visit | Attending: Surgery | Admitting: Surgery

## 2018-04-04 ENCOUNTER — Telehealth: Payer: Self-pay

## 2018-04-04 ENCOUNTER — Encounter (HOSPITAL_COMMUNITY): Payer: Self-pay | Admitting: Interventional Radiology

## 2018-04-04 ENCOUNTER — Other Ambulatory Visit: Payer: Self-pay | Admitting: Surgery

## 2018-04-04 DIAGNOSIS — K632 Fistula of intestine: Secondary | ICD-10-CM

## 2018-04-04 DIAGNOSIS — N739 Female pelvic inflammatory disease, unspecified: Secondary | ICD-10-CM

## 2018-04-04 DIAGNOSIS — Z4803 Encounter for change or removal of drains: Secondary | ICD-10-CM | POA: Insufficient documentation

## 2018-04-04 DIAGNOSIS — K651 Peritoneal abscess: Secondary | ICD-10-CM | POA: Diagnosis not present

## 2018-04-04 HISTORY — PX: IR CATHETER TUBE CHANGE: IMG717

## 2018-04-04 MED ORDER — LIDOCAINE HCL (PF) 1 % IJ SOLN
INTRAMUSCULAR | Status: AC | PRN
  Administered 2018-04-04: 5 mL

## 2018-04-04 MED ORDER — FENTANYL CITRATE (PF) 100 MCG/2ML IJ SOLN
INTRAMUSCULAR | Status: AC
  Filled 2018-04-04: qty 4

## 2018-04-04 MED ORDER — MIDAZOLAM HCL 2 MG/2ML IJ SOLN
INTRAMUSCULAR | Status: AC
  Filled 2018-04-04: qty 4

## 2018-04-04 MED ORDER — FENTANYL CITRATE (PF) 100 MCG/2ML IJ SOLN
INTRAMUSCULAR | Status: AC | PRN
  Administered 2018-04-04 (×2): 25 ug via INTRAVENOUS

## 2018-04-04 MED ORDER — MIDAZOLAM HCL 2 MG/2ML IJ SOLN
INTRAMUSCULAR | Status: AC | PRN
  Administered 2018-04-04: 0.5 mg via INTRAVENOUS

## 2018-04-04 MED ORDER — LIDOCAINE HCL 1 % IJ SOLN
INTRAMUSCULAR | Status: AC
  Filled 2018-04-04: qty 20

## 2018-04-04 MED ORDER — IOPAMIDOL (ISOVUE-300) INJECTION 61%
INTRAVENOUS | Status: AC
  Administered 2018-04-04: 10 mL
  Filled 2018-04-04: qty 50

## 2018-04-04 NOTE — Telephone Encounter (Signed)
CT Entero abd/pelvis 04/12/18. Drain will not be pulled. Cancel the CT abd/pelvis.

## 2018-04-04 NOTE — Telephone Encounter (Signed)
-----   Message from Christina Pole, MD sent at 04/04/2018  1:23 PM EDT ----- Regarding: RE: enterocolonic fistula Ok thanks ----- Message ----- From: [name], MD Sent: 04/04/2018   1:01 PM EDT To: Christina Pole, MD Subject: enterocolonic fistula                          Giving feedback on this patient we were dealing with in the hospital.  Discussed with my colorectal colleagues, Dr. Marcello Moores and Dr. Dema Severin.  We all 3 feel like the patient would benefit from IBD w/u including colonoscopy & ileal biopsies to work-up and rule out Crohn's/inflammatory bowel disease.  R/o colitis.  Do biopsies. Consider CT enterorrhography.  ?[facility] genetic testing?  Leave the drain in for now.  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  Not worse off ABx.  No obstructive symptoms.  Patient would benefit from immunosuppressive therapy.  Get on Remicade.  Follow.  Consider pulling the drain if she is stable.  See if the internal fistula will close with Remicade immunosuppression.  Hold off on any surgery.  Should the patient's fistula not close after 6 months of immunosuppresion/Remicade, then can consider surgery for small bowel resection and possible partial colectomy.  Perhaps even able to wedge off the sigmoid colon  If endoscopic work-up and biopsies show no evidence of any Crohn's or inflammatory bowel disease, then perhaps we could reconsider surgery first.  If the patient has Crohn's, doing surgery first could be a potential nightmare with ostomies, leaking, etc.

## 2018-04-04 NOTE — Discharge Instructions (Addendum)
Moderate Conscious Sedation, Adult, Care After These instructions provide you with information about caring for yourself after your procedure. Your health care provider may also give you more specific instructions. Your treatment has been planned according to current medical practices, but problems sometimes occur. Call your health care provider if you have any problems or questions after your procedure. What can I expect after the procedure? After your procedure, it is common:  To feel sleepy for several hours.  To feel clumsy and have poor balance for several hours.  To have poor judgment for several hours.  To vomit if you eat too soon.  Follow these instructions at home: For at least 24 hours after the procedure:   Do not: ? Participate in activities where you could fall or become injured. ? Drive. ? Use heavy machinery. ? Drink alcohol. ? Take sleeping pills or medicines that cause drowsiness. ? Make important decisions or sign legal documents. ? Take care of children on your own.  Rest. Eating and drinking  Follow the diet recommended by your health care provider.  If you vomit: ? Drink water, juice, or soup when you can drink without vomiting. ? Make sure you have little or no nausea before eating solid foods. General instructions  Have a responsible adult stay with you until you are awake and alert.  Take over-the-counter and prescription medicines only as told by your health care provider.  If you smoke, do not smoke without supervision.  Keep all follow-up visits as told by your health care provider. This is important. Contact a health care provider if:  You keep feeling nauseous or you keep vomiting.  You feel light-headed.  You develop a rash.  You have a fever. Get help right away if:  You have trouble breathing. This information is not intended to replace advice given to you by your health care provider. Make sure you discuss any questions you have  with your health care provider. Document Released: 04/24/2013 Document Revised: 12/07/2015 Document Reviewed: 10/24/2015 Elsevier Interactive Patient Education  2018 [place]. Percutaneous Abscess Drain, Care After This sheet gives you information about how to care for yourself after your procedure. Your health care provider may also give you more specific instructions. If you have problems or questions, contact your health care provider. What can I expect after the procedure? After your procedure, it is common to have:  A small amount of bruising and discomfort in the area where the drainage tube (catheter) was placed.  Sleepiness and fatigue. This should go away after the medicines you were given have worn off.  Follow these instructions at home: Incision care  Follow instructions from your health care provider about how to take care of your incision. Make sure you: ? Wash your hands with soap and water before you change your bandage (dressing). If soap and water are not available, use hand sanitizer. ? Change your dressing as told by your health care provider. ? Leave stitches (sutures), skin glue, or adhesive strips in place. These skin closures may need to stay in place for 2 weeks or longer. If adhesive strip edges start to loosen and curl up, you may trim the loose edges. Do not remove adhesive strips completely unless your health care provider tells you to do that.  Check your incision area every day for signs of infection. Check for: ? More redness, swelling, or pain. ? More fluid or blood. ? Warmth. ? Pus or a bad smell. ? Fluid leaking from around  your catheter (instead of fluid draining through your catheter). Catheter care  Follow instructions from your health care provider about emptying and cleaning your catheter and collection bag. You may need to clean the catheter every day so it does not clog.  If directed, write down the following information every time you  empty your bag: ? The date and time. ? The amount of drainage. General instructions  Rest at home for 1-2 days after your procedure. Return to your normal activities as told by your health care provider.  Do not take baths, swim, or use a hot tub for 24 hours after your procedure, or until your health care provider says that this is okay.  Take over-the-counter and prescription medicines only as told by your health care provider.  Keep all follow-up visits as told by your health care provider. This is important. Contact a health care provider if:  You have less than 10 mL of drainage a day for 2-3 days in a row, or as directed by your health care provider.  You have more redness, swelling, or pain around your incision area.  You have more fluid or blood coming from your incision area.  Your incision area feels warm to the touch.  You have pus or a bad smell coming from your incision area.  You have fluid leaking from around your catheter (instead of through your catheter).  You have a fever or chills.  You have pain that does not get better with medicine. Get help right away if:  Your catheter comes out.  You suddenly stop having drainage from your catheter.  You suddenly have blood in the fluid that is draining from your catheter.  You become dizzy or you faint.  You develop a rash.  You have nausea or vomiting.  You have difficulty breathing or you feel short of breath.  You develop chest pain.  You have problems with your speech or vision.  You have trouble balancing or moving your arms or legs. Summary  It is common to have a small amount of bruising and discomfort in the area where the drainage tube (catheter) was placed.  You may be directed to record the amount of drainage from the bag every time you empty it.  Follow instructions from your health care provider about emptying and cleaning your catheter and collection bag. This information is not  intended to replace advice given to you by your health care provider. Make sure you discuss any questions you have with your health care provider. Document Released: 11/18/2013 Document Revised: 05/26/2016 Document Reviewed: 05/26/2016 Elsevier Interactive Patient Education  2017 [employer].

## 2018-04-04 NOTE — Progress Notes (Signed)
Patient ID: Christina Miller, female   DOB: 1966/04/14, 52 y.o.   MRN: [id number]   Pt was scheduled for left low abdominal abscess drain upsize and exchange yesterday  Dr Earleen Newport was called away for Code Stroke  Pt returns today for procedure- rescheduled from yesterday  No changes in pt status  Scheduled now to proceed Consent signed and in IR

## 2018-04-04 NOTE — Telephone Encounter (Signed)
ok 

## 2018-04-09 MED FILL — NORMAL SALINE FLUSH SYRINGE: 0.9 | 20 days supply | Qty: 200 | Fill #0

## 2018-04-11 ENCOUNTER — Ambulatory Visit: Payer: Self-pay | Admitting: Nurse Practitioner

## 2018-04-12 ENCOUNTER — Encounter: Payer: Self-pay | Admitting: Radiology

## 2018-04-12 ENCOUNTER — Ambulatory Visit
Admission: RE | Admit: 2018-04-12 | Discharge: 2018-04-12 | Disposition: A | Discharge status: Home / Self Care | Payer: Self-pay | Source: Ambulatory Visit | Attending: Surgery | Admitting: Surgery

## 2018-04-12 DIAGNOSIS — N739 Female pelvic inflammatory disease, unspecified: Secondary | ICD-10-CM

## 2018-04-12 HISTORY — PX: IR RADIOLOGIST EVAL & MGMT: IMG5224

## 2018-04-12 MED ORDER — IOPAMIDOL (ISOVUE-300) INJECTION 61%
100.0000 mL | Freq: Once | INTRAVENOUS | Status: AC | PRN
  Administered 2018-04-12: 100 mL via INTRAVENOUS

## 2018-04-12 NOTE — Progress Notes (Signed)
Patient ID: Christina Miller, female   DOB: 1966/06/15, 52 y.o.   MRN: [id number]       Chief Complaint:   Pelvic abscess, drain follow-up  Referring Physician(s): Gross,Steven  History of Present Illness: Christina Miller is a 52 y.o. female with a left lower quadrant pelvic abscess drainage into the sigmoid colon.  This is either secondary to diverticulitis versus inflammatory bowel disease or Crohn's disease.  CT imaging today demonstrates resolution of the abscess.  Stable drain catheter position.  No evidence of obstruction.  No new collections.  She is on oral antibiotics.  No significant output.  She continues to flush the drain and keep to gravity drainage.  Overall she reports no significant abdominal pain.  No fevers.  Stable weight and appetite.  Past Medical History:  Diagnosis Date  . Abnormal Pap smear    cryo  . Anemia   . Bone fracture    ankle  . COPD (chronic obstructive pulmonary disease) ([place])   . Emphysema   . GERD (gastroesophageal reflux disease)   . Headache(784.0)   . Hypertension   . Infection    urinary tract infection  . MS (multiple sclerosis) ([place])   . Pelvic abscess in female     Past Surgical History:  Procedure Laterality Date  . ARM WOUND REPAIR / CLOSURE    . BREAST BIOPSY Left 07/03/2012  . COLPOSCOPY    . DILATION AND CURETTAGE OF UTERUS    . IR CATHETER TUBE CHANGE  04/04/2018  . IR RADIOLOGIST EVAL & MGMT  03/29/2018  . IR RADIOLOGIST EVAL & MGMT  04/12/2018  . JP DRAIN TUBE    . TUBAL LIGATION      Allergies: Hydrocodone-acetaminophen  Medications: Prior to Admission medications   Medication Sig Start Date End Date Taking? Authorizing Provider  albuterol (PROVENTIL HFA;VENTOLIN HFA) 108 (90 Base) MCG/ACT inhaler Inhale 2 puffs into the lungs every 6 (six) hours as needed for wheezing or shortness of breath. 03/26/18   Ladell Pier, MD  amLODipine (NORVASC) 10 MG tablet Take 1 tablet (10 mg total) by mouth daily. 03/26/18   Ladell Pier, MD  famotidine (PEPCID) 20 MG tablet Take 1 tablet (20 mg total) by mouth 2 (two) times daily. 03/15/18   Charlynne Cousins, MD  nicotine (NICODERM CQ - DOSED IN MG/24 HOURS) 21 mg/24hr patch Place 1 patch (21 mg total) onto the skin daily. 03/16/18   Charlynne Cousins, MD  predniSONE (DELTASONE) 10 MG tablet As directed in the office for Prednisone taper 03/29/18   [name], MD  sulfamethoxazole-trimethoprim (BACTRIM DS,SEPTRA DS) 800-160 MG tablet Take 1 tablet by mouth every 12 (twelve) hours. 03/30/18   [name], MD     Family History  Problem Relation Age of Onset  . Diabetes Father   . Diabetes Sister   . Hypertension Sister   . Diabetes Brother   . Hypertension Brother   . Cancer Mother   . Other Neg Hx     Social History   Socioeconomic History  . Marital status: Legally Separated    Spouse name: Not on file  . Number of children: Not on file  . Years of education: Not on file  . Highest education level: Not on file  Occupational History  . Not on file  Social Needs  . Financial resource strain: Not on file  . Food insecurity:    Worry: Not on file    Inability: Not on file  .  Transportation needs:    Medical: Not on file    Non-medical: Not on file  Tobacco Use  . Smoking status: Current Every Day Smoker    Packs/day: 1.00    Types: Cigarettes  . Smokeless tobacco: Former Network engineer and Sexual Activity  . Alcohol use: Yes    Comment: Quit Sep 02, 2016  . Drug use: No  . Sexual activity: Yes    Birth control/protection: Surgical  Lifestyle  . Physical activity:    Days per week: Not on file    Minutes per session: Not on file  . Stress: Not on file  Relationships  . Social connections:    Talks on phone: Not on file    Gets together: Not on file    Attends religious service: Not on file    Active member of club or organization: Not on file    Attends meetings of clubs or organizations: Not on file     Relationship status: Not on file  Other Topics Concern  . Not on file  Social History Narrative   ** Merged History Encounter **         Review of Systems: A 12 point ROS discussed and pertinent positives are indicated in the HPI above.  All other systems are negative.  Review of Systems  Vital Signs: BP 125/80   Pulse 88   Temp 98.2 F (36.8 C)   LMP 06/20/2012   SpO2 98%   Physical Exam  Constitutional: She appears well-developed and well-nourished. No distress.  Abdominal: Soft. She exhibits no distension. There is no tenderness.  Left lower quadrant drain catheter site clean, dry and intact.  Skin: She is not diaphoretic.     Imaging: Ct Pelvis W Contrast  Result Date: 03/29/2018 CLINICAL DATA:  52 year old female with chronic abdominal cramping, nausea, vomiting and diarrhea. She was diagnosed with possible bowel perforation versus fistula and deep pelvic abscess formation on 03/09/2018 and was subsequently treated by percutaneous drain placement on 03/10/2018. She presents today for follow-up CT imaging. Patient is currently feeling better and drain output is minimal. EXAM: CT PELVIS WITH CONTRAST TECHNIQUE: Multidetector CT imaging of the pelvis was performed using the standard protocol following the bolus administration of intravenous contrast. CONTRAST:  169m ISOVUE-300 IOPAMIDOL (ISOVUE-300) INJECTION 61% COMPARISON:  Most recent prior CT abdomen/pelvis 03/14/2018 FINDINGS: The percutaneous drainage catheter remains in good position. There is persistent flocculent fluid and gas surrounding the pigtail of the drainage catheter. The collection measures approximately 3.9 x 2.3 x 2.5 cm. The degree of inflammatory wall thickening of the adjacent sigmoid colon has improved. Scattered colonic diverticula are again visualized. No new or undrained abscess collection identified. Atherosclerotic vascular Kacie inside dental fide in the distal abdominal aorta. The visualized pelvis,  uterus and adnexa are unremarkable. No suspicious lymphadenopathy. No evidence of osseous lesion. IMPRESSION: Persistent 3.9 x 2.3 x 2.5 cm flocculent fluid and gas collection surrounding the previously placed percutaneous drainage catheter. The drainage catheter remains well positioned. Improving inflammatory changes involving the adjacent sigmoid colon. Signed, HCriselda Peaches MD, R[facility] Electronically Signed   By: HJacqulynn CadetM.D.   On: 03/29/2018 16:54   Ct Abdomen Pelvis W Contrast  Result Date: 03/14/2018 CLINICAL DATA:  Pelvic abscess, status post percutaneous drain 03/10/2018 EXAM: CT ABDOMEN AND PELVIS WITH CONTRAST TECHNIQUE: Multidetector CT imaging of the abdomen and pelvis was performed using the standard protocol following bolus administration of intravenous contrast. CONTRAST:  76m OMNIPAQUE IOHEXOL 300 MG/ML  SOLN COMPARISON:  03/10/2018 FINDINGS: Lower chest: No acute abnormality. Hepatobiliary: No focal liver abnormality is seen. No gallstones, gallbladder wall thickening, or biliary dilatation. Pancreas: Unremarkable. No pancreatic ductal dilatation or surrounding inflammatory changes. Spleen: Normal in size without focal abnormality. Adrenals/Urinary Tract: Adrenal glands are unremarkable. Kidneys are normal, without renal calculi, focal lesion, or hydronephrosis. Bladder is unremarkable. Stomach/Bowel: Negative for bowel obstruction, significant dilatation, ileus, or free air. Left lower quadrant anterior pelvic drain catheter is stable in position. There is a persistent fluid collection at the drain catheter site containing enteric contrast. This collection measures 5.5 x 2.4 cm, image 58 series 2. Findings suspicious for adjacent fistulas to the sigmoid colon and the adjacent ileum. Adjacent wall thickening persist of the sigmoid colon. Diverticular disease also evident. No new fluid collections within the  abdomen or pelvis. No free fluid or ascites. Vascular/Lymphatic: Aortic atherosclerosis noted. No aneurysm or dissection. Mesenteric and renal vasculature appear patent. Iliac vessels are patent. No adenopathy. Reproductive: Uterus and adnexa normal in size. Other: No abdominal wall hernia or abnormality. No abdominopelvic ascites. Musculoskeletal: Minor lumbar spine degenerative changes. No acute osseous finding. IMPRESSION: Interval placement of a anterior left pelvic drain catheter. 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 Minimal improvement in the pelvic inflammation. Wall thickening persist of the sigmoid colon with diverticular disease noted. Appearance suggest some improvement in the acute diverticulitis/colitis. No new abdominal or pelvic fluid collections. Negative for obstruction, ileus or free air. Electronically Signed   By: MJerilynn Mages  Ailyn Gladd M.D.   On: 03/14/2018 13:20   Ir Catheter Tube Change  Result Date: 04/04/2018 CLINICAL DATA:  Persistent abscess cavity on CT imaging with a fistulous communication between the drainage catheter in the adjacent rectum on catheter injection. Patient has almost no output from the drainage catheter suggesting that it is undersized. EXAM: ABSCESS DRAIN CATHETER EXCHANGE AND UP SIZING UNDER FLUOROSCOPY TECHNIQUE: The procedure, risks (including but not limited to bleeding, infection, organ damage), benefits, and alternatives were explained to the patient. Questions regarding the procedure were encouraged and answered. The patient understands and consents to the procedure. Catheter and surrounding skin prepped with Betadine, draped in usual sterile fashion. Intravenous Fentanyl and Versed were administered as conscious sedation during continuous monitoring of the patient's level of consciousness and physiological / cardiorespiratory status by the  radiology RN, with a total moderate sedation time of 10 minutes. Surrounding skin infiltrated with 1% lidocaine. Survey fluoroscopic inspection reveals stable position of the left pelvic drain catheter. Injection confirms small residual abscess cavity with fistula to rectum. Catheter was cut and exchanged over an Amplatz wire for 14 French pigtail drain catheter, formed centrally within the residual abscess cavity. Small injection confirms appropriate positioning. Catheter secured externally with 0 point suture and StatLock and placed to gravity drain bag. The patient tolerated the procedure well. IMPRESSION: 1. Technically successful exchange and up sizing of left pelvic abscess drain catheter. Electronically Signed   By: DLucrezia EuropeM.D.   On: 04/04/2018 16:49   Dg Sinus/fist Tube Chk-non Gi  Result Date: 03/29/2018 INDICATION: 52year old female with suspected Crohn's disease and intra-abdominal abscess. CT imaging today demonstrates a persistent abscess collection surrounding the previously placed drainage catheter. She presents now for contrast injection to evaluate for fistula. EXAM: Drain injection MEDICATIONS: None ANESTHESIA/SEDATION: None COMPLICATIONS: None immediate. PROCEDURE: Informed written consent was obtained from the patient after  a thorough discussion of the procedural risks, benefits and alternatives. All questions were addressed. A timeout was performed prior to the initiation of the procedure. Initial images demonstrate a well-positioned drainage catheter. Following injection of iodinated contrast, there is initial filling of the residual abscess cavity followed by opacification of the adjacent sigmoid colon. Contrast material then proceeds distally into the rectum. IMPRESSION: Positive for fistulous communication with the sigmoid colon. Electronically Signed   By: Jacqulynn Cadet M.D.   On: 03/29/2018 16:55   Ir Radiologist Eval & Mgmt  Result Date: 04/12/2018 Please refer to notes  tab for details about interventional procedure. (Op Note)  Ir Radiologist Eval & Mgmt  Result Date: 03/29/2018 Please refer to notes tab for details about interventional procedure. (Op Note)   Labs:  CBC: Recent Labs    03/15/18 0356 03/26/18 1026 03/29/18 1140 04/03/18 1258  WBC 9.4 19.4* 16.0 Manual smear review agrees with instrument differential.* 10.5  HGB 11.1* 12.3 12.0 12.3  HCT 34.9* 36.7 35.9* 38.3  PLT 275 285 225.0 232    COAGS: Recent Labs    03/10/18 0620 04/03/18 1258  INR 0.96 0.87  APTT 26 24    BMP: Recent Labs    03/13/18 0515 03/14/18 0523 03/15/18 0356 03/26/18 1026 03/29/18 1140  NA 141 138 141 134 131*  K 4.6 4.1 4.0 4.4 4.9  CL 105 102 105 96 96  CO2 29 30 29 22 29   GLUCOSE 131* 142* 109* 83 115*  BUN 5* 10 11 14 11   CALCIUM 8.9 9.1 8.5* 9.3 9.3  CREATININE 0.54 0.58 0.55 0.69 0.72  GFRNONAA >60 >60 >60 100  --   GFRAA >60 >60 >60 115  --     LIVER FUNCTION TESTS: Recent Labs    03/05/18 1446 03/09/18 1315 03/26/18 1026 03/29/18 1140  BILITOT 0.6 0.7 0.3 0.3  AST 23 25 24 15   ALT 20 17 20 17   ALKPHOS 62 45 49 41  PROT 7.1 6.5 6.7 7.2  ALBUMIN 3.6 3.1* 4.1 4.0    TUMOR MARKERS: No results for input(s): AFPTM, CEA, CA199, CHROMGRNA in the last 8760 hours.  Assessment and Plan:  Resolved abscess by CT.  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  Stable drain catheter position.  Plan: Continue daily flushing.  Keep to gravity drainage.  Outpatient surgery follow-up already scheduled with Dr. gross.    Electronically Signed: Greggory Keen 04/12/2018, 11:31 AM   I spent a total of    15 Minutes in face to face in clinical consultation, greater than 50% of which was counseling/coordinating care for a pelvic abscess drain

## 2018-04-24 ENCOUNTER — Ambulatory Visit (INDEPENDENT_AMBULATORY_CARE_PROVIDER_SITE_OTHER): Payer: Self-pay | Admitting: Nurse Practitioner

## 2018-04-24 ENCOUNTER — Encounter: Payer: Self-pay | Admitting: Nurse Practitioner

## 2018-04-24 VITALS — BP 118/78 | HR 127 | Ht 61.0 in | Wt 115.0 lb

## 2018-04-24 DIAGNOSIS — K9289 Other specified diseases of the digestive system: Secondary | ICD-10-CM

## 2018-04-24 NOTE — Patient Instructions (Signed)
If you are age 53 or older, your body mass index should be between 23-30. Your Body mass index is 21.73 kg/m. If this is out of the aforementioned range listed, please consider follow up with your Primary Care Provider.  If you are age 79 or younger, your body mass index should be between 19-25. Your Body mass index is 21.73 kg/m. If this is out of the aformentioned range listed, please consider follow up with your Primary Care Provider.   Your colonoscopy is scheduled for 05/07/18 at 1:30 pm.  Please follow your instructions given to you previously.  Thank you for choosing me and [facility].   Tye Savoy, NP

## 2018-04-24 NOTE — Progress Notes (Signed)
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 Surgery is following her. Etiology not clear, ? Crohn's disease.  -small amount of output in drainage bag every day. BMs normal. No significant abdominal pain.  -continue to taper Prednisone as as scheduled.  -Will proceed with colonoscopy as already scheduled for this month. The risks and benefits of colonoscopy with possible polypectomy were discussed and the patient agrees to proceed.  -If turn out to be Crohn's disease will treat accordingly but not sure fistula will heal without surgery  HPI:      Patient is a 52 year old female who was hospitalized late August with several weeks of abdominal pain, nausea, vomiting and diarrhea.  CT scan showed inflammation of the mid to distal small bowel / TI with profound mural thickening and extensive surrounding inflammatory changes with enteroenteric and enterocolonic fistulae and a pelvic abscess.She was started on antibiotics, IR placed a drain and steroids were started for possible Crohn's disease.  She was seen for office follow-up mid-September, doing better at the time.  Follow up CT scan showed persistent fluid collection and her drain was upsized by IR on 03/29/2018.  CT enterography 9/26 showed a lot of stool in the rectum, suspicion of persistent fistulous tract from small bowel and possibly sigmoid. No findings to suggest active Crohn disease or other small bowel pathology. Wall thickening at the sigmoid persists suggesting chronic colitis or diverticulitis.  Patient is here for follow-up.  She is on a tapering dose of prednisone, currently at 20 mg but going down to 10 mg within the next day or so. Still on Bactrim.  Her bowel movements are normal, no blood in her stool.  She has  some burning at the site of the drain but no significant abdominal pain.  Not much output in drainage bad on daily basis but complains of the associated odor. Flushing drain as directed.  Overall she feels okay.  Patient is scheduled for colonoscopy to be done later this month.  She recently saw Dr. Johney Maine with [facility].    Review of systems:     No chest pain, no SOB, no fevers, no urinary sx   Past Medical History:  Diagnosis Date  . Abnormal Pap smear    cryo  . Anemia   . Bone fracture    ankle  . COPD (chronic obstructive pulmonary disease) ([place])   . Emphysema   . GERD (gastroesophageal reflux disease)   . Headache(784.0)   . Hypertension   . Infection    urinary tract infection  . MS (multiple sclerosis) ([place])   . Pelvic abscess in female     Patient's surgical history, family medical history, social history, medications and allergies were all reviewed in Epic   Creatinine clearance cannot be calculated (Patient's most recent lab result is older than the maximum 21 days allowed.)  Current Outpatient Medications  Medication Sig Dispense Refill  . albuterol (PROVENTIL HFA;VENTOLIN HFA) 108 (90 Base) MCG/ACT inhaler Inhale 2 puffs into the lungs every 6 (six) hours as needed for wheezing or shortness of breath. 1 Inhaler 2  . amLODipine (NORVASC) 10 MG tablet Take 1 tablet (10 mg total) by mouth daily. 30 tablet 5  . famotidine (PEPCID) 20 MG  tablet Take 1 tablet (20 mg total) by mouth 2 (two) times daily. 30 tablet 0  . nicotine (NICODERM CQ - DOSED IN MG/24 HOURS) 21 mg/24hr patch Place 1 patch (21 mg total) onto the skin daily. 28 patch 0  . predniSONE (DELTASONE) 10 MG tablet As directed in the office for Prednisone taper 100 tablet 0  . sulfamethoxazole-trimethoprim (BACTRIM DS,SEPTRA DS) 800-160 MG tablet Take 1 tablet by mouth every 12 (twelve) hours. 60 tablet 0   No current facility-administered medications for this visit.     Physical Exam:     BP  118/78   Pulse (!) 127   Ht 5' 1"  (1.549 m)   Wt 115 lb (52.2 kg)   LMP 06/20/2012   BMI 21.73 kg/m   GENERAL:  Pleasant female in NAD PSYCH: : Cooperative, normal affect EENT:  conjunctiva pink, mucous membranes moist, neck supple without masses CARDIAC:  RRR,  no peripheral edema PULM: Normal respiratory effort, lungs CTA bilaterally, no wheezing ABDOMEN:  Nondistended, soft, moderate LLQ tenderness at around drain.  No obvious masses, no hepatomegaly,  normal bowel sounds SKIN:  turgor, no lesions seen Musculoskeletal:  Normal muscle tone, normal strength NEURO: Alert and oriented x 3, no focal neurologic deficits   Christina Miller , NP 04/24/2018, 11:24 AM

## 2018-04-25 MED FILL — ALBUTEROL SULFATE HFA 108 (: 108 (90 BAS | 25 days supply | Qty: 18 | Fill #1

## 2018-04-25 MED FILL — AMLODIPINE BESYLATE 10 MG T: 10 | 30 days supply | Qty: 30 | Fill #1

## 2018-04-25 NOTE — Progress Notes (Signed)
Reviewed and agree with documentation and assessment and plan.  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 Surgery doesn't want to operate unless the etiology is clear. Will proceed with colonoscopy as scheduled.   Damaris Hippo , MD

## 2018-04-27 ENCOUNTER — Ambulatory Visit: Payer: Self-pay

## 2018-04-30 ENCOUNTER — Telehealth: Payer: Self-pay | Admitting: Gastroenterology

## 2018-04-30 ENCOUNTER — Telehealth: Payer: Self-pay | Admitting: Internal Medicine

## 2018-04-30 MED FILL — NORMAL SALINE FLUSH SYRINGE: 0.9 | 20 days supply | Qty: 200 | Fill #0

## 2018-04-30 NOTE — Telephone Encounter (Signed)
Im pretty sure her PCP handles this..... Note into PCP for them to order  Dr Silverio Decamp would PCP order this ?

## 2018-04-30 NOTE — Telephone Encounter (Signed)
Will forward to pcp

## 2018-04-30 NOTE — Telephone Encounter (Signed)
She may have been getting them through IR or surgery office.

## 2018-04-30 NOTE — Telephone Encounter (Signed)
Patient says she needs normal saline syringes for her JP drain. Please follow up with patient.

## 2018-05-01 ENCOUNTER — Ambulatory Visit: Payer: Self-pay

## 2018-05-03 ENCOUNTER — Ambulatory Visit: Payer: Self-pay | Attending: Internal Medicine | Admitting: *Deleted

## 2018-05-03 DIAGNOSIS — Z23 Encounter for immunization: Secondary | ICD-10-CM

## 2018-05-03 NOTE — Progress Notes (Signed)
Pt visit to see nurse for flu shot.  She said that she wanted nurse to look at her abdominal dressing and tell her if it was normal. Low abdm dsg was D&I and well secured.  Pt  notes small amount fluid that forms around tube insertion site with flushing.  Drain flushes easily without resistance per pt.  No change in color of drainage fluid and the amount of drainage is the same per pt.  Instructed pt to put gauze at drainage site to catch the drainage.   Pt c/o area smelling, but again, she said, this is not a new.  Pt said she was running out of supplies and Triage gave pt skin prep pads and hypafix tape instructed on their use.  [facility] does not stock drain sponges and pt instructed she can buy them at [facility] if she needs them before next doctor visit. Flu shot given to R deltoid.  Pt tolerated it well.

## 2018-05-07 ENCOUNTER — Other Ambulatory Visit: Payer: Self-pay

## 2018-05-07 ENCOUNTER — Encounter: Payer: Self-pay | Admitting: Gastroenterology

## 2018-05-07 ENCOUNTER — Telehealth: Payer: Self-pay | Admitting: Gastroenterology

## 2018-05-07 NOTE — Telephone Encounter (Signed)
She does feel poorly. Rescheduled to 05/30/18. New instructions mailed to the patient.

## 2018-05-07 NOTE — Telephone Encounter (Signed)
Colonoscopy cancelled for this afternoon but no notes in the chart. ?Patient has flu, unclear if patient had testing and is on treatment.  She got rescheduled for Friday 10/25.  Beth, please check if she really has the Flu, if yes, may need to wait longer prior to rescheduling. Thanks

## 2018-05-07 NOTE — Telephone Encounter (Signed)
Nurse calling to check on pt.

## 2018-05-08 ENCOUNTER — Other Ambulatory Visit: Payer: Self-pay

## 2018-05-08 ENCOUNTER — Telehealth: Payer: Self-pay | Admitting: Gastroenterology

## 2018-05-08 DIAGNOSIS — N739 Female pelvic inflammatory disease, unspecified: Secondary | ICD-10-CM

## 2018-05-08 MED ORDER — SULFAMETHOXAZOLE-TRIMETHOPRIM 800-160 MG PO TABS: 1.0000 | ORAL_TABLET | Freq: Two times a day (BID) | ORAL | 0 refills | Status: DC

## 2018-05-11 ENCOUNTER — Other Ambulatory Visit (HOSPITAL_COMMUNITY): Payer: Self-pay | Admitting: Interventional Radiology

## 2018-05-11 ENCOUNTER — Telehealth (HOSPITAL_COMMUNITY): Payer: Self-pay

## 2018-05-11 ENCOUNTER — Encounter: Payer: Self-pay | Admitting: Gastroenterology

## 2018-05-11 DIAGNOSIS — N739 Female pelvic inflammatory disease, unspecified: Secondary | ICD-10-CM

## 2018-05-11 NOTE — Telephone Encounter (Signed)
Called to schedule drain exchange, no answer, left vm. AW

## 2018-05-14 ENCOUNTER — Ambulatory Visit (HOSPITAL_COMMUNITY): Admission: RE | Admit: 2018-05-14 | Payer: Self-pay | Source: Ambulatory Visit

## 2018-05-15 ENCOUNTER — Other Ambulatory Visit: Payer: Self-pay | Admitting: Physician Assistant

## 2018-05-16 ENCOUNTER — Encounter (HOSPITAL_COMMUNITY): Payer: Self-pay | Admitting: Interventional Radiology

## 2018-05-16 ENCOUNTER — Ambulatory Visit (HOSPITAL_COMMUNITY)
Admission: RE | Admit: 2018-05-16 | Discharge: 2018-05-16 | Disposition: A | Discharge status: Home / Self Care | Payer: Medicaid Other | Source: Ambulatory Visit | Attending: Interventional Radiology | Admitting: Interventional Radiology

## 2018-05-16 DIAGNOSIS — N739 Female pelvic inflammatory disease, unspecified: Secondary | ICD-10-CM | POA: Insufficient documentation

## 2018-05-16 DIAGNOSIS — Z4803 Encounter for change or removal of drains: Secondary | ICD-10-CM | POA: Diagnosis not present

## 2018-05-16 HISTORY — PX: IR CATHETER TUBE CHANGE: IMG717

## 2018-05-16 MED ORDER — LIDOCAINE HCL 1 % IJ SOLN
INTRAMUSCULAR | Status: AC
  Filled 2018-05-16: qty 20

## 2018-05-16 MED ORDER — IOPAMIDOL (ISOVUE-300) INJECTION 61%
INTRAVENOUS | Status: AC
  Administered 2018-05-16: 5 mL
  Filled 2018-05-16: qty 50

## 2018-05-16 MED ORDER — LIDOCAINE HCL (PF) 1 % IJ SOLN
INTRAMUSCULAR | Status: DC | PRN
  Administered 2018-05-16: 5 mL

## 2018-05-25 MED FILL — NORMAL SALINE FLUSH SYRINGE: 0.9 | 10 days supply | Qty: 100 | Fill #0

## 2018-05-28 ENCOUNTER — Ambulatory Visit: Payer: Self-pay | Admitting: Internal Medicine

## 2018-05-30 ENCOUNTER — Ambulatory Visit (AMBULATORY_SURGERY_CENTER): Payer: Self-pay | Admitting: Gastroenterology

## 2018-05-30 ENCOUNTER — Encounter (HOSPITAL_COMMUNITY): Payer: Self-pay | Admitting: Interventional Radiology

## 2018-05-30 VITALS — BP 156/90 | HR 91 | Temp 99.1°F | Resp 16 | Ht 61.0 in | Wt 115.0 lb

## 2018-05-30 DIAGNOSIS — D122 Benign neoplasm of ascending colon: Secondary | ICD-10-CM

## 2018-05-30 DIAGNOSIS — D124 Benign neoplasm of descending colon: Secondary | ICD-10-CM

## 2018-05-30 DIAGNOSIS — K5720 Diverticulitis of large intestine with perforation and abscess without bleeding: Secondary | ICD-10-CM

## 2018-05-30 DIAGNOSIS — D123 Benign neoplasm of transverse colon: Secondary | ICD-10-CM

## 2018-05-30 DIAGNOSIS — K632 Fistula of intestine: Secondary | ICD-10-CM

## 2018-05-30 MED ORDER — SODIUM CHLORIDE 0.9 % IV SOLN: 500.0000 mL | Freq: Once | INTRAVENOUS | Status: DC

## 2018-05-30 NOTE — Progress Notes (Signed)
Pt bag was intact and the end was closed.  Pt continues to pass a good amount of flatus.  No problems noted in the recovery room. maw

## 2018-05-30 NOTE — Progress Notes (Signed)
Called to room to assist during endoscopic procedure.  Patient ID and intended procedure confirmed with present staff. Received instructions for my participation in the procedure from the performing physician.  

## 2018-05-30 NOTE — Progress Notes (Signed)
Report to PACU, RN, vss, BBS= Clear.  

## 2018-05-30 NOTE — Op Note (Signed)
[facility] Patient Name: Christina Miller Procedure Date: 05/30/2018 2:29 PM MRN: [id number] Endoscopist: [name] , MD Age: 52 Referring MD:  Date of Birth: 03-18-1966 Gender: Female Account #: [id number] Procedure:                Colonoscopy Indications:              Obtain more precise diagnosis of inflammatory bowel                            disease, Abdominal pain in the left lower quadrant,                            pelvic/LLQ abscess s/p drainage with pigtail                            catheter, sigmoid colon fistula Medicines:                Monitored Anesthesia Care Procedure:                Pre-Anesthesia Assessment:                           - Prior to the procedure, a History and Physical                            was performed, and patient medications and                            allergies were reviewed. The patient's tolerance of                            previous anesthesia was also reviewed. The risks                            and benefits of the procedure and the sedation                            options and risks were discussed with the patient.                            All questions were answered, and informed consent                            was obtained. Prior Anticoagulants: The patient has                            taken no previous anticoagulant or antiplatelet                            agents. ASA Grade Assessment: III - A patient with                            severe systemic disease. After reviewing the risks  and benefits, the patient was deemed in                            satisfactory condition to undergo the procedure.                           After obtaining informed consent, the colonoscope                            was passed under direct vision. Throughout the                            procedure, the patient's blood pressure, pulse, and                            oxygen saturations  were monitored continuously. The                            Colonoscope was introduced through the anus and                            advanced to the the terminal ileum, with                            identification of the appendiceal orifice and IC                            valve. The colonoscopy was technically difficult                            and complex due to extrinsic compression and                            restricted mobility of the colon. The patient                            tolerated the procedure well. The quality of the                            bowel preparation was excellent. The terminal                            ileum, ileocecal valve, appendiceal orifice, and                            rectum were photographed. Scope In: 2:36:05 PM Scope Out: 2:58:10 PM Scope Withdrawal Time: 0 hours 16 minutes 37 seconds  Total Procedure Duration: 0 hours 22 minutes 5 seconds  Findings:                 The perianal and digital rectal examinations were                            normal.  The terminal ileum appeared normal. Biopsies were                            taken with a cold forceps for histology.                           Three sessile polyps were found in the descending                            colon, transverse colon and ascending colon. The                            polyps were 5 to 9 mm in size. These polyps were                            removed with a cold snare. Resection and retrieval                            were complete.                           Normal mucosa was found in the entire colon.                            Biopsies were taken with a cold forceps for                            histology.                           Multiple small and large-mouthed diverticula were                            found in the sigmoid colon. There was evidence of                            diverticular spasm. Peri-diverticular erythema was                             seen. There was evidence of an impacted                            diverticulum. Purulent discharge was seen in                            association with the diverticular opening,                            suspicious of diverticulitis noted in distal                            sigmoid colon at 20cm , possible site of fistula.  Non-bleeding internal hemorrhoids were found during                            retroflexion. The hemorrhoids were small. Complications:            No immediate complications. Estimated Blood Loss:     Estimated blood loss was minimal. Impression:               - The examined portion of the ileum was normal.                            Biopsied.                           - Three 5 to 9 mm polyps in the descending colon,                            in the transverse colon and in the ascending colon,                            removed with a cold snare. Resected and retrieved.                           - Normal mucosa in the entire examined colon.                            Biopsied.                           - Moderate diverticulosis in the sigmoid colon.                            There was evidence of diverticular spasm.                            Peri-diverticular erythema was seen. There was                            evidence of an impacted diverticulum. Purulent                            discharge was seen in association with the                            diverticular opening, suspicious of diverticulitis.                           - Non-bleeding internal hemorrhoids.                           -No endoscopic findings to suggest IBD or Crohn's                            disease Recommendation:           - Patient has a contact number available for  emergencies. The signs and symptoms of potential                            delayed complications were discussed with the                             patient. Return to normal activities tomorrow.                            Written discharge instructions were provided to the                            patient.                           - Resume previous diet.                           - Continue present medications.                           - Await pathology results.                           - Repeat colonoscopy in 3 - 5 years for                            surveillance based on pathology results.                           - Follow up with [facility]/Dr Gross for surgical                            resection of signoid colon fistula (Likely etiology                            perforated diverticulitis) [name], MD 05/30/2018 3:09:10 PM This report has been signed electronically.

## 2018-05-30 NOTE — Patient Instructions (Signed)
YOU HAD AN ENDOSCOPIC PROCEDURE TODAY AT [facility]:   Refer to the procedure report that was given to you for any specific questions about what was found during the examination.  If the procedure report does not answer your questions, please call your gastroenterologist to clarify.  If you requested that your care partner not be given the details of your procedure findings, then the procedure report has been included in a sealed envelope for you to review at your convenience later.  YOU SHOULD EXPECT: Some feelings of bloating in the abdomen. Passage of more gas than usual.  Walking can help get rid of the air that was put into your GI tract during the procedure and reduce the bloating. If you had a lower endoscopy (such as a colonoscopy or flexible sigmoidoscopy) you may notice spotting of blood in your stool or on the toilet paper. If you underwent a bowel prep for your procedure, you may not have a normal bowel movement for a few days.  Please Note:  You might notice some irritation and congestion in your nose or some drainage.  This is from the oxygen used during your procedure.  There is no need for concern and it should clear up in a day or so.  SYMPTOMS TO REPORT IMMEDIATELY:   Following lower endoscopy (colonoscopy or flexible sigmoidoscopy):  Excessive amounts of blood in the stool  Significant tenderness or worsening of abdominal pains  Swelling of the abdomen that is new, acute  Fever of 100F or higher   For urgent or emergent issues, a gastroenterologist can be reached at any hour by calling [phone].   DIET:  We do recommend a small meal at first, but then you may proceed to your regular diet.  Drink plenty of fluids but you should avoid alcoholic beverages for 24 hours.  ACTIVITY:  You should plan to take it easy for the rest of today and you should NOT DRIVE or use heavy machinery until tomorrow (because of the sedation medicines used during the test).     FOLLOW UP: Our staff will call the number listed on your records the next business day following your procedure to check on you and address any questions or concerns that you may have regarding the information given to you following your procedure. If we do not reach you, we will leave a message.  However, if you are feeling well and you are not experiencing any problems, there is no need to return our call.  We will assume that you have returned to your regular daily activities without incident.  If any biopsies were taken you will be contacted by phone or by letter within the next 1-3 weeks.  Please call us at [phone] if you have not heard about the biopsies in 3 weeks.    SIGNATURES/CONFIDENTIALITY: You and/or your care partner have signed paperwork which will be entered into your electronic medical record.  These signatures attest to the fact that that the information above on your After Visit Summary has been reviewed and is understood.  Full responsibility of the confidentiality of this discharge information lies with you and/or your care-partner.    Handouts were given to your care partner on polyps, diverticulosis, and hemorrhoids. Per [name] discontinue PREDNISONE.   You may resume your current medications today. Await biopsy results. Follow up with [facility]/Dr. Gross for surgical resection of sigmoid colon fistula.  Likely etiology preforated diverticulitis. Please call if any questions or concerns.

## 2018-05-31 ENCOUNTER — Telehealth: Payer: Self-pay | Admitting: *Deleted

## 2018-05-31 NOTE — Telephone Encounter (Signed)
  Follow up Call-  Call back number 05/30/2018  Post procedure Call Back phone  # [phone]  Permission to leave phone message Yes  Some recent data might be hidden     Patient questions:  Do you have a fever, pain , or abdominal swelling? No. Pain Score  0 *  Have you tolerated food without any problems? Yes.    Have you been able to return to your normal activities? Yes.    Do you have any questions about your discharge instructions: Diet   No. Medications  No. Follow up visit  No.  Do you have questions or concerns about your Care? No.  Actions: * If pain score is 4 or above: No action needed, pain <4.

## 2018-06-05 ENCOUNTER — Ambulatory Visit: Payer: Self-pay | Admitting: Internal Medicine

## 2018-06-07 ENCOUNTER — Ambulatory Visit: Payer: Self-pay | Admitting: Surgery

## 2018-06-11 MED FILL — NORMAL SALINE FLUSH SYRINGE: 0.9 | 10 days supply | Qty: 100 | Fill #1

## 2018-06-26 ENCOUNTER — Ambulatory Visit: Payer: Self-pay | Admitting: Surgery

## 2018-06-26 DIAGNOSIS — K5720 Diverticulitis of large intestine with perforation and abscess without bleeding: Secondary | ICD-10-CM

## 2018-06-26 DIAGNOSIS — Z598 Other problems related to housing and economic circumstances: Secondary | ICD-10-CM

## 2018-06-26 DIAGNOSIS — Z599 Problem related to housing and economic circumstances, unspecified: Secondary | ICD-10-CM

## 2018-06-26 NOTE — H&P (Signed)
Christina Miller Documented: 06/26/2018 11:17 AM Location: [facility] Patient #: [id number] DOB: 16-Dec-1965 Separated / Language: Christina Miller / Race: White Female   History of Present Illness Adin Hector MD; 06/26/2018 11:59 AM) The patient is a 52 year old female who presents for an intra-abdominal abscess. Note for "Intra-abdominal abscess": ` ` ` Patient sent for surgical consultation at the request of Dr Silverio Decamp  Chief Complaint: Intra-abdominal abscess. Inflammatory bowel disease versus diverticulitis etiology. ` ` The patient is a pleasant woman with intermittent abdominal pain as that progressed to the point of pain and intra-abdominal abscess. An area of inflammation between her ileum and sigmoid colon. Suspicious for inflammatory bowel disease. Admitted and placed on IV antibiotics. Drainage of the abscess done 03/10/2018. Percutaneous drain. Discharge. Discussions between general surgery and gastroenterology. IBD workup done. No proof of any ileitis or inflammatory bowel disease by endoscopy or biopsy. No major concerns for inflammatory bowel disease on CT enterography. 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 Still there as of late October.   Given the negative inflammatory bowel workup, the assumption he was more diverticulitis. I discussed about colectomy if the rest of the workup was negative. Put in orders a few weeks ago. It has been a challenge to get a hold of her. I believe she's been stressed out by bills and financial issues. Applied for Medicaid in the hospital according to her. That seemed to be lost and fallen through. Reapplying again. Still has the drain. Stitches have pulled through but drain is in place. She's been flushing it. It flushes right back out at the skin which scared her at first. Still some brown drainage into the drainage bag. Not high-volume. She is eating better but appetite not normal. She's moving  her bowels every day. Sensitive at the drain site but otherwise feeling better. Smoking still, but she is trying to back off. She's here with her significant other. Appetite and energy level are not back to normal but certainly improved and when she was in the hospital.  No personal nor family history of GI/colon cancer, inflammatory bowel disease, irritable bowel syndrome, allergy such as Celiac Sprue, dietary/dairy problems, colitis, ulcers nor gastritis. No recent sick contacts/gastroenteritis. No travel outside the country. No changes in diet. No dysphagia to solids or liquids. No significant heartburn or reflux. No hematochezia, hematemesis, coffee ground emesis. No evidence of prior gastric/peptic ulceration.  (Review of systems as stated in this history (HPI) or in the review of systems. Otherwise all other 12 point ROS are negative) ` ` `   Medication History (Alisha Spillers, CMA; 06/26/2018 11:17 AM) Medications Reconciled  Vitals (Alisha Spillers CMA; 06/26/2018 11:17 AM) 06/26/2018 11:17 AM Weight: 115 lb Height: 61.5in Body Surface Area: 1.5 m Body Mass Index: 21.38 kg/m  Pulse: 109 (Regular)  BP: 162/82 (Sitting, Left Arm, Standard)       Physical Exam Adin Hector MD; 06/26/2018 11:57 AM) General Mental Status-Alert. General Appearance-Not in acute distress, Not Sickly. Orientation-Oriented X3. Hydration-Well hydrated. Voice-Normal.  Integumentary Global Assessment Upon inspection and palpation of skin surfaces of the - Axillae: non-tender, no inflammation or ulceration, no drainage. and Distribution of scalp and body hair is normal. General Characteristics Temperature - normal warmth is noted.  Head and Neck Head-normocephalic, atraumatic with no lesions or palpable masses. Face Global Assessment - atraumatic, no absence of expression. Neck Global Assessment - no abnormal movements, no bruit auscultated on the right,  no bruit auscultated on the left, no decreased  range of motion, non-tender. Trachea-midline. Thyroid Gland Characteristics - non-tender.  Eye Eyeball - Left-Extraocular movements intact, No Nystagmus. Eyeball - Right-Extraocular movements intact, No Nystagmus. Cornea - Left-No Hazy. Cornea - Right-No Hazy. Sclera/Conjunctiva - Left-No scleral icterus, No Discharge. Sclera/Conjunctiva - Right-No scleral icterus, No Discharge. Pupil - Left-Direct reaction to light normal. Pupil - Right-Direct reaction to light normal.  ENMT Ears Pinna - Left - no drainage observed, no generalized tenderness observed. Right - no drainage observed, no generalized tenderness observed. Nose and Sinuses External Inspection of the Nose - no destructive lesion observed. Inspection of the nares - Left - quiet respiration. Right - quiet respiration. Mouth and Throat Lips - Upper Lip - no fissures observed, no pallor noted. Lower Lip - no fissures observed, no pallor noted. Nasopharynx - no discharge present. Oral Cavity/Oropharynx - Tongue - no dryness observed. Oral Mucosa - no cyanosis observed. Hypopharynx - no evidence of airway distress observed.  Chest and Lung Exam Inspection Movements - Normal and Symmetrical. Accessory muscles - No use of accessory muscles in breathing. Palpation Palpation of the chest reveals - Non-tender. Auscultation Breath sounds - Normal and Clear.  Cardiovascular Auscultation Rhythm - Regular. Murmurs & Other Heart Sounds - Auscultation of the heart reveals - No Murmurs and No Systolic Clicks.  Abdomen Inspection Inspection of the abdomen reveals - No Visible peristalsis and No Abnormal pulsations. Umbilicus - No Bleeding, No Urine drainage. Palpation/Percussion Palpation and Percussion of the abdomen reveal - Soft, Non Tender, No Rebound tenderness, No Rigidity (guarding) and No Cutaneous hyperesthesia. Note: Drain in left lower quadrant. Stitch pulled  through & butterfly dressing nearly fallen off. Otherwise clean dressing on top of that. I removed everything and cleaned it all up. Replaced with gauze. Thin feculent drainage. Patient but no major cellulitis. There is the abdomen is soft and flat and nontender. No distasis recti. No umbilical or other anterior abdominal wall hernias   Female Genitourinary Sexual Maturity Tanner 5 - Adult hair pattern. Note: No vaginal bleeding nor discharge   Peripheral Vascular Upper Extremity Inspection - Left - No Cyanotic nailbeds, Not Ischemic. Right - No Cyanotic nailbeds, Not Ischemic.  Neurologic Neurologic evaluation reveals -normal attention span and ability to concentrate, able to name objects and repeat phrases. Appropriate fund of knowledge , normal sensation and normal coordination. Mental Status Affect - not angry, not paranoid. Cranial Nerves-Normal Bilaterally. Gait-Normal.  Neuropsychiatric Mental status exam performed with findings of-able to articulate well with normal speech/language, rate, volume and coherence, thought content normal with ability to perform basic computations and apply abstract reasoning and no evidence of hallucinations, delusions, obsessions or homicidal/suicidal ideation.  Musculoskeletal Global Assessment Spine, Ribs and Pelvis - no instability, subluxation or laxity. Right Upper Extremity - no instability, subluxation or laxity.  Lymphatic Head & Neck  General Head & Neck Lymphatics: Bilateral - Description - No Localized lymphadenopathy. Axillary  General Axillary Region: Bilateral - Description - No Localized lymphadenopathy. Femoral & Inguinal  Generalized Femoral & Inguinal Lymphatics: Left - Description - No Localized lymphadenopathy. Right - Description - No Localized lymphadenopathy.    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  I think she would benefit from segmental colonic resection. Minimally invasive robotic sigmoid colectomy. Should be isolated to the point that we can resect and primarily anastomosis. Roddick approach ideally. Recovery pathway.  She is past the point of timing.  We could do another drain study but drainage output while low volume seems feculent. Most likely just leave the drain and remove at the time of surgery. Avoid an extra drain study.  She is very nervous about the cost of all this as she does not have insurance. She applied for Medicaid when she was an inpatient August but it seems like paperwork was loss of she's had to reapply again. Hopefullt that with help to at least cover some of the cost. Otherwise, her likelihood of worsening problems with rehospitalizations and more operations is highly likely. That will be more costly than trying to electively do this more ideal conditions  I encouraged her to quit smoking to minimize the risk of leak or other postoperative complications. She understands. His been hard for her but she is trying to cut back some. We'll give some support information. Current Plans Pt Education - [facility] Drain Care (Rickeya Manus) PREOP COLON - ENCOUNTER FOR PREOPERATIVE EXAMINATION FOR GENERAL SURGICAL PROCEDURE (Z01.818) Current Plans You are being scheduled for surgery- Our schedulers will call you.  You should hear from our office's scheduling department within 5 working days about the location, date, and time of surgery. We try to make accommodations for patient's preferences in scheduling surgery, but sometimes the OR schedule or the surgeon's schedule prevents [place] from making those accommodations.  If you have not heard from our office [phone]) in 5 working days, call the office and ask for your surgeon's nurse.  If you have other questions about your diagnosis, plan, or surgery, call the office  and ask for your surgeon's nurse.  Written instructions provided The anatomy & physiology of the digestive tract was discussed. The pathophysiology of the colon was discussed. Natural history risks without surgery was discussed. I feel the risks of no intervention will lead to serious problems that outweigh the operative risks; therefore, I recommended a partial colectomy to remove the pathology. Minimally invasive (Robotic/Laparoscopic) & open techniques were discussed.  Risks such as bleeding, infection, abscess, leak, reoperation, possible ostomy, hernia, heart attack, death, and other risks were discussed. I noted a good likelihood this will help address the problem. Goals of post-operative recovery were discussed as well. Need for adequate nutrition, daily bowel regimen and healthy physical activity, to optimize recovery was noted as well. We will work to minimize complications. Educational materials were available as well. Questions were answered. The patient expresses understanding & wishes to proceed with surgery.  Pt Education - [facility] Colon Bowel Prep 2018 ERAS/Miralax/Antibiotics Started Neomycin Sulfate 500 MG Oral Tablet, 2 (two) Tablet SEE NOTE, #6, 06/26/2018, No Refill. Local Order: TAKE TWO TABLETS AT 2 PM, 3 PM, AND 10 PM THE DAY PRIOR TO SURGERY Started Flagyl 500 MG Oral Tablet, 2 (two) Tablet SEE NOTE, #6, 06/26/2018, No Refill. Local Order: Take at 2pm, 3pm, and 10pm the day prior to your colon operation Pt Education - Pamphlet Given - Laparoscopic Colorectal Surgery: discussed with patient and provided information. Pt Education - [facility] Colectomy post-op instructions: discussed with patient and provided information. DIVERTICULITIS OF LARGE INTESTINE WITH ABSCESS WITHOUT BLEEDING (K57.20) Current Plans Pt Education - [facility] Diverticular Disease (AT) TOBACCO ABUSE (Z72.0) Current Plans Pt Education - [facility] STOP SMOKING!  Adin Hector, MD, FACS, MASCRS Gastrointestinal  and Minimally Invasive Surgery    [address] [place], [place] [address] [phone] Main / Paging [phone] Fax

## 2018-07-02 ENCOUNTER — Ambulatory Visit: Payer: Medicaid Other | Attending: Internal Medicine | Admitting: Internal Medicine

## 2018-07-02 ENCOUNTER — Telehealth: Payer: Self-pay

## 2018-07-02 ENCOUNTER — Encounter: Payer: Self-pay | Admitting: Internal Medicine

## 2018-07-02 VITALS — BP 144/89 | HR 92 | Temp 97.9°F | Resp 16 | Wt 115.0 lb

## 2018-07-02 DIAGNOSIS — F17200 Nicotine dependence, unspecified, uncomplicated: Secondary | ICD-10-CM

## 2018-07-02 DIAGNOSIS — K5730 Diverticulosis of large intestine without perforation or abscess without bleeding: Secondary | ICD-10-CM | POA: Insufficient documentation

## 2018-07-02 DIAGNOSIS — Z8249 Family history of ischemic heart disease and other diseases of the circulatory system: Secondary | ICD-10-CM | POA: Diagnosis not present

## 2018-07-02 DIAGNOSIS — K219 Gastro-esophageal reflux disease without esophagitis: Secondary | ICD-10-CM | POA: Insufficient documentation

## 2018-07-02 DIAGNOSIS — F17210 Nicotine dependence, cigarettes, uncomplicated: Secondary | ICD-10-CM | POA: Diagnosis not present

## 2018-07-02 DIAGNOSIS — Z833 Family history of diabetes mellitus: Secondary | ICD-10-CM | POA: Diagnosis not present

## 2018-07-02 DIAGNOSIS — D126 Benign neoplasm of colon, unspecified: Secondary | ICD-10-CM | POA: Insufficient documentation

## 2018-07-02 DIAGNOSIS — Z885 Allergy status to narcotic agent status: Secondary | ICD-10-CM | POA: Diagnosis not present

## 2018-07-02 DIAGNOSIS — F4329 Adjustment disorder with other symptoms: Secondary | ICD-10-CM

## 2018-07-02 DIAGNOSIS — Z79899 Other long term (current) drug therapy: Secondary | ICD-10-CM | POA: Diagnosis not present

## 2018-07-02 DIAGNOSIS — I10 Essential (primary) hypertension: Secondary | ICD-10-CM | POA: Diagnosis present

## 2018-07-02 DIAGNOSIS — F4320 Adjustment disorder, unspecified: Secondary | ICD-10-CM | POA: Insufficient documentation

## 2018-07-02 DIAGNOSIS — G35 Multiple sclerosis: Secondary | ICD-10-CM | POA: Insufficient documentation

## 2018-07-02 DIAGNOSIS — J449 Chronic obstructive pulmonary disease, unspecified: Secondary | ICD-10-CM | POA: Diagnosis not present

## 2018-07-02 NOTE — Progress Notes (Signed)
Patient ID: Christina Miller, female    DOB: Feb 25, 1966  MRN: [id number]  CC: Follow-up (2 month )   Subjective: Christina Miller is a 52 y.o. female who presents for chronic ds management Her concerns today include:  Patient with history of MS, COPD, HTN, tobacco dependence, GERD and possible Crohn's disease  Patient was last seen by me back in September post hospital for intra-abdominal abscess.  She was thought to have Crohn's disease.  Subsequently underwent colonoscopy last month which revealed moderate diverticulosis with likely sigmoid fistula secondary to perforated sigmoid diverticulum.  She also had 3 polyps removed, tubular adenomas. She has seen the general surgeon in follow-up and plan is for resection of the sigmoid colon.  Application was made to [facility] when she was in the hospital at the end of August.  However she recently found out that the application may have been misplaced so she had to start the application process all over again.  She was told by social services that it can take up to 90 days before she is approved.  Patient is very stressed about how she will pay her medical bills.  The drain was left in place until surgery.   HTN:  Did not take amlodipine in a few wks. patient states she has been so stressed that she has forgotten to take her medication.  Tobacco dependence: She continues to smoke.  She states that she is smoking more that she is stressed out.  Patient Active Problem List   Diagnosis Date Noted  . Financial difficulties 06/26/2018  . Tobacco dependence 03/26/2018  . Essential hypertension 03/26/2018  . Multiple sclerosis ([place]) 03/26/2018  . Anemia 03/26/2018  . Pelvic abscess in female   . 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 Intra-abdominal abscess ([place]) 03/09/2018  . COPD (chronic obstructive pulmonary disease) ([place]) 03/09/2018  . Sepsis due to Escherichia coli (E. coli) ([place]) 03/09/2018  . Diverticulitis of large intestine with perforation  and abscess 02/24/2018  . Protrusion of lumbar intervertebral disc 04/21/2016  . Breast lump on left side at 7 o'clock position 06/22/2012  . Tobacco abuse 06/02/2011  . Perimenopause 06/02/2011  . Hematuria, microscopic 06/02/2011     Current Outpatient Medications on File Prior to Visit  Medication Sig Dispense Refill  . albuterol (PROVENTIL HFA;VENTOLIN HFA) 108 (90 Base) MCG/ACT inhaler Inhale 2 puffs into the lungs every 6 (six) hours as needed for wheezing or shortness of breath. 1 Inhaler 2  . amLODipine (NORVASC) 10 MG tablet Take 1 tablet (10 mg total) by mouth daily. 30 tablet 5  . famotidine (PEPCID) 20 MG tablet Take 1 tablet (20 mg total) by mouth 2 (two) times daily. (Patient not taking: Reported on 05/30/2018) 30 tablet 0  . nicotine (NICODERM CQ - DOSED IN MG/24 HOURS) 21 mg/24hr patch Place 1 patch (21 mg total) onto the skin daily. (Patient not taking: Reported on 05/30/2018) 28 patch 0  . predniSONE (DELTASONE) 10 MG tablet As directed in the office for Prednisone taper (Patient not taking: Reported on 05/30/2018) 100 tablet 0  . sulfamethoxazole-trimethoprim (BACTRIM DS,SEPTRA DS) 800-160 MG tablet Take 1 tablet by mouth every 12 (twelve) hours. (Patient not taking: Reported on 07/02/2018) 60 tablet 0   No current facility-administered medications on file prior to visit.     Allergies  Allergen Reactions  . Hydrocodone-Acetaminophen Nausea And Vomiting    Social History   Socioeconomic History  . Marital status: Legally Separated    Spouse name: Not on file  .  Number of children: Not on file  . Years of education: Not on file  . Highest education level: Not on file  Occupational History  . Not on file  Social Needs  . Financial resource strain: Not on file  . Food insecurity:    Worry: Not on file    Inability: Not on file  . Transportation needs:    Medical: Not on file    Non-medical: Not on file  Tobacco Use  . Smoking status: Current Every Day  Smoker    Packs/day: 1.00    Types: Cigarettes  . Smokeless tobacco: Former Network engineer and Sexual Activity  . Alcohol use: Yes    Comment: occ  . Drug use: No  . Sexual activity: Yes    Birth control/protection: Surgical  Lifestyle  . Physical activity:    Days per week: Not on file    Minutes per session: Not on file  . Stress: Not on file  Relationships  . Social connections:    Talks on phone: Not on file    Gets together: Not on file    Attends religious service: Not on file    Active member of club or organization: Not on file    Attends meetings of clubs or organizations: Not on file    Relationship status: Not on file  . Intimate partner violence:    Fear of current or ex partner: Not on file    Emotionally abused: Not on file    Physically abused: Not on file    Forced sexual activity: Not on file  Other Topics Concern  . Not on file  Social History Narrative   ** Merged History Encounter **        Family History  Problem Relation Age of Onset  . Diabetes Father   . Diabetes Sister   . Hypertension Sister   . Diabetes Brother   . Hypertension Brother   . Lung cancer Mother   . Colon cancer Neg Hx   . Stomach cancer Neg Hx   . Pancreatic cancer Neg Hx   . Esophageal cancer Neg Hx   . Rectal cancer Neg Hx     Past Surgical History:  Procedure Laterality Date  . ARM WOUND REPAIR / CLOSURE    . BREAST BIOPSY Left 07/03/2012  . COLPOSCOPY    . DILATION AND CURETTAGE OF UTERUS    . IR CATHETER TUBE CHANGE  04/04/2018  . IR CATHETER TUBE CHANGE  05/16/2018  . IR RADIOLOGIST EVAL & MGMT  03/29/2018  . IR RADIOLOGIST EVAL & MGMT  04/12/2018  . JP DRAIN TUBE    . TUBAL LIGATION      ROS: Review of Systems Neg except as above  PHYSICAL EXAM: BP (!) 144/89 (BP Location: Right Arm, Cuff Size: Small) Comment: recheck  Pulse 92   Temp 97.9 F (36.6 C) (Oral)   Resp 16   Wt 115 lb (52.2 kg)   LMP 06/20/2012   SpO2 95%   BMI 21.73 kg/m     Physical Exam  General appearance - alert, well appearing, and in no distress Mental status - normal mood, behavior, speech, dress, motor activity, and thought processes Neck - supple, no significant adenopathy Chest - clear to auscultation, no wheezes, rales or rhonchi, symmetric air entry Heart - normal rate, regular rhythm, normal S1, S2, no murmurs, rubs, clicks or gallops Abdomen -normal bowel sounds.  Drain in the left lower abdomen.  Minimal drainage noted in  the bag that is attached to the left thigh Extremities - peripheral pulses normal, no pedal edema, no clubbing or cyanosis  Depression screen [facility] 2/9 03/26/2018 04/21/2016  Decreased Interest 0 2  Down, Depressed, Hopeless 0 2  PHQ - 2 Score 0 4  Altered sleeping - 2  Tired, decreased energy - 3  Change in appetite - 2  Feeling bad or failure about yourself  - 1  Trouble concentrating - 1  Moving slowly or fidgety/restless - 0  Suicidal thoughts - 0  PHQ-9 Score - 13    ASSESSMENT AND PLAN: 1. Essential hypertension Not at goal.  Patient to restart amlodipine  2. Tobacco dependence Advised to quit.  However she feels she is too stressed out to do so at this time.  She does have nicotine patches at home which she states she will use when she feels she is ready to give a trial of quitting. 3. Stress and adjustment reaction Patient declines speaking with our LCSW.  She does not feel that she needs to be on any medication at this time.  4. 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  She may benefit from a referral to legal aid for that matter.  5.  Tubular adenomatous polyps: Will be due for repeat Pap in 3 years.   Patient was given the opportunity to ask questions.  Patient verbalized understanding of the plan and was able to repeat key elements of the plan.   No orders of the  defined types were placed in this encounter.    Requested Prescriptions    No prescriptions requested or ordered in this encounter    No follow-ups on file.  Karle Plumber, MD, FACP

## 2018-07-02 NOTE — Telephone Encounter (Signed)
Met with the patient at request of Dr Wynetta Emery.  The patient explained that she applied for medicaid when she was in the hospital.  She said that she signed many documents and thought that the application was complete.  She then said that when she called DSS to follow up on the status of the application, she was informed that they had no record of her application.  She said that she has re- applied but hopes that there is a way to expedite the application because she is in need of surgery.   She was agreeable to a referral to [employer] [place] and the referral was faxed to the attention of Abelino Derrick. This CM informed her that there is no guarantee that Legal Aid can assist because the application has not been denied.   This CM provided her with the contact # for [place] Counselor - Shanon Rosser # [phone] and encouraged her to call to explain her situation and inquire about the application that she had started when she was in the hospital

## 2018-07-02 NOTE — Patient Instructions (Signed)
Your blood pressure is not at goal.  Please restart the amlodipine.

## 2018-07-03 ENCOUNTER — Other Ambulatory Visit (HOSPITAL_COMMUNITY): Payer: Self-pay | Admitting: Interventional Radiology

## 2018-07-03 DIAGNOSIS — N739 Female pelvic inflammatory disease, unspecified: Secondary | ICD-10-CM

## 2018-07-04 ENCOUNTER — Ambulatory Visit (HOSPITAL_COMMUNITY): Admission: RE | Admit: 2018-07-04 | Payer: Self-pay | Source: Ambulatory Visit

## 2018-07-05 ENCOUNTER — Ambulatory Visit (HOSPITAL_COMMUNITY)
Admission: RE | Admit: 2018-07-05 | Discharge: 2018-07-05 | Disposition: A | Discharge status: Home / Self Care | Payer: Medicaid Other | Source: Ambulatory Visit | Attending: Interventional Radiology | Admitting: Interventional Radiology

## 2018-07-05 ENCOUNTER — Encounter (HOSPITAL_COMMUNITY): Payer: Self-pay | Admitting: Diagnostic Radiology

## 2018-07-05 DIAGNOSIS — Z4803 Encounter for change or removal of drains: Secondary | ICD-10-CM | POA: Diagnosis present

## 2018-07-05 DIAGNOSIS — K632 Fistula of intestine: Secondary | ICD-10-CM | POA: Diagnosis not present

## 2018-07-05 DIAGNOSIS — N739 Female pelvic inflammatory disease, unspecified: Secondary | ICD-10-CM

## 2018-07-05 HISTORY — PX: IR CATHETER TUBE CHANGE: IMG717

## 2018-07-05 MED ORDER — LIDOCAINE HCL 1 % IJ SOLN
INTRAMUSCULAR | Status: DC | PRN
  Administered 2018-07-05: 7 mL

## 2018-07-05 MED ORDER — LIDOCAINE HCL 1 % IJ SOLN
INTRAMUSCULAR | Status: AC
  Filled 2018-07-05: qty 20

## 2018-07-05 MED ORDER — IOPAMIDOL (ISOVUE-300) INJECTION 61%
INTRAVENOUS | Status: AC
  Administered 2018-07-05: 15 mL
  Filled 2018-07-05: qty 50

## 2018-07-05 NOTE — Procedures (Signed)
Interventional Radiology Procedure:   Indications: Pelvic abscess treated with percutaneous drain and persistent colonic fistula  Procedure: Drain injection and exchange  Findings: Persistent fistula.  New 14 fr drain placed.  Complications: None     EBL: None  Plan: Keep drain until surgery.     Mitzie Marlar R. Anselm Pancoast, MD  Pager: [phone]

## 2018-07-10 ENCOUNTER — Telehealth: Payer: Self-pay | Admitting: Radiology

## 2018-07-10 MED FILL — NORMAL SALINE FLUSH SYRINGE: 0.9 | 20 days supply | Qty: 100 | Fill #0

## 2018-07-10 NOTE — Progress Notes (Signed)
   Pt is calling Pharmacy for refill on sterile saline flushes  Most recent visit to IR 07/05/18:  Indications: Pelvic abscess treated with percutaneous drain and persistent colonic fistula Procedure: Drain injection and exchange Findings: Persistent fistula.  New 14 fr drain placed.  Pt is to continue to flush once daily and keep drain til surgery  Refilled saline flushes #20 [facility]

## 2018-07-25 ENCOUNTER — Telehealth: Payer: Self-pay

## 2018-07-25 NOTE — Telephone Encounter (Signed)
As per Christina Miller, [employer] [place], they spoke to the patient and are not able to assist her with expediting the medicaid application.

## 2018-07-27 NOTE — Telephone Encounter (Signed)
Done

## 2018-08-17 ENCOUNTER — Telehealth: Payer: Self-pay | Admitting: Student

## 2018-08-17 MED FILL — NORMAL SALINE FLUSH SYRINGE: 0.9 | 10 days supply | Qty: 100 | Fill #0

## 2018-08-17 NOTE — Telephone Encounter (Signed)
Faxed prescription refill request to [facility] [phone]) at 1204- NS flush syringes, flush with 5 mL as directed once daily, dispense 100 syringes with 3 refills.  Bea Graff Aanchal Cope, PA-C 08/17/2018, 12:53 PM

## 2018-09-10 ENCOUNTER — Encounter (HOSPITAL_COMMUNITY): Admission: RE | Admit: 2018-09-10 | Payer: Self-pay | Source: Ambulatory Visit

## 2018-09-12 ENCOUNTER — Encounter (HOSPITAL_COMMUNITY): Admission: RE | Payer: Self-pay | Source: Home / Self Care

## 2018-09-12 ENCOUNTER — Inpatient Hospital Stay (HOSPITAL_COMMUNITY): Admission: RE | Admit: 2018-09-12 | Payer: Self-pay | Source: Home / Self Care | Admitting: Surgery

## 2018-09-12 SURGERY — COLECTOMY, PARTIAL, ROBOT-ASSISTED, LAPAROSCOPIC
Anesthesia: General

## 2018-09-13 ENCOUNTER — Ambulatory Visit: Payer: Self-pay | Admitting: Internal Medicine

## 2018-09-17 ENCOUNTER — Ambulatory Visit: Payer: Self-pay | Admitting: Internal Medicine

## 2018-09-19 ENCOUNTER — Other Ambulatory Visit (HOSPITAL_COMMUNITY): Payer: Self-pay | Admitting: Diagnostic Radiology

## 2018-09-19 DIAGNOSIS — N739 Female pelvic inflammatory disease, unspecified: Secondary | ICD-10-CM

## 2018-09-20 ENCOUNTER — Ambulatory Visit (HOSPITAL_COMMUNITY): Admission: RE | Admit: 2018-09-20 | Payer: Self-pay | Source: Ambulatory Visit

## 2018-09-21 ENCOUNTER — Encounter (HOSPITAL_COMMUNITY): Payer: Self-pay | Admitting: Interventional Radiology

## 2018-09-21 ENCOUNTER — Ambulatory Visit (HOSPITAL_COMMUNITY)
Admission: RE | Admit: 2018-09-21 | Discharge: 2018-09-21 | Disposition: A | Discharge status: Home / Self Care | Payer: Medicaid Other | Source: Ambulatory Visit | Attending: Diagnostic Radiology | Admitting: Diagnostic Radiology

## 2018-09-21 DIAGNOSIS — N739 Female pelvic inflammatory disease, unspecified: Secondary | ICD-10-CM | POA: Diagnosis not present

## 2018-09-21 DIAGNOSIS — Z4803 Encounter for change or removal of drains: Secondary | ICD-10-CM | POA: Insufficient documentation

## 2018-09-21 HISTORY — PX: IR CATHETER TUBE CHANGE: IMG717

## 2018-09-21 MED ORDER — LIDOCAINE HCL 1 % IJ SOLN
INTRAMUSCULAR | Status: DC | PRN
  Administered 2018-09-21: 10 mL

## 2018-09-21 MED ORDER — IOPAMIDOL (ISOVUE-300) INJECTION 61%
INTRAVENOUS | Status: AC
  Administered 2018-09-21: 20 mL
  Filled 2018-09-21: qty 50

## 2018-09-21 MED ORDER — LIDOCAINE HCL 1 % IJ SOLN
INTRAMUSCULAR | Status: AC
  Filled 2018-09-21: qty 20

## 2018-09-21 NOTE — Procedures (Signed)
Pelvic drain with fistula  S/p fluoro exchg and downsize to 19f  No comp Stable Keep to gravity Full report in pacs

## 2018-09-28 MED FILL — NORMAL SALINE FLUSH SYRINGE: 0.9 | 10 days supply | Qty: 100 | Fill #1

## 2018-10-01 ENCOUNTER — Telehealth: Payer: Self-pay | Admitting: Student

## 2018-10-01 NOTE — Telephone Encounter (Signed)
PA received message to call patient with questions re: drain.  Attempted to call, however no answer.  Will continue to attempt to reach patient.   Brynda Greathouse, MS RD PA-C 4:36 PM

## 2018-11-16 ENCOUNTER — Encounter: Payer: Self-pay | Admitting: Internal Medicine

## 2018-11-16 ENCOUNTER — Other Ambulatory Visit: Payer: Self-pay

## 2018-11-16 ENCOUNTER — Ambulatory Visit: Payer: Medicaid Other | Attending: Internal Medicine | Admitting: Internal Medicine

## 2018-11-16 DIAGNOSIS — K5730 Diverticulosis of large intestine without perforation or abscess without bleeding: Secondary | ICD-10-CM | POA: Diagnosis not present

## 2018-11-16 DIAGNOSIS — F17210 Nicotine dependence, cigarettes, uncomplicated: Secondary | ICD-10-CM

## 2018-11-16 DIAGNOSIS — I10 Essential (primary) hypertension: Secondary | ICD-10-CM | POA: Diagnosis not present

## 2018-11-16 DIAGNOSIS — F17200 Nicotine dependence, unspecified, uncomplicated: Secondary | ICD-10-CM

## 2018-11-16 MED ORDER — AMLODIPINE BESYLATE 5 MG PO TABS: 5.0000 mg | ORAL_TABLET | Freq: Every day | ORAL | 3 refills | Status: DC

## 2018-11-16 MED FILL — AMLODIPINE BESYLATE 5 MG TA: 5 | 90 days supply | Qty: 90 | Fill #0

## 2018-11-16 NOTE — Progress Notes (Signed)
Virtual Visit via Telephone Note  I connected with Christina Miller on 11/16/18 at 3:39 p.m EDT by telephone and verified that I am speaking with the correct person using two identifiers.   I discussed the limitations, risks, security and privacy concerns of performing an evaluation and management service by telephone and the availability of in person appointments. I also discussed with the patient that there may be a patient responsible charge related to this service. The patient expressed understanding and agreed to proceed.   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  Surgeon had plan to do resection of the sigmoid colon.   -received PC yesterday telling her that she was approved for Medicaid and disability. Still has drain in LT lower abdomen.  Noticed more drainage around tube than coming out in drainage bag.  Drainage bag was changed over 1 mth ago by interventional radiology.   No pain or fever but chronic sore feeling in the abdomen.  Spoke with Dr. Clyda Greener office yesterday to let them know she was approved for Medicaid.  However, she wants to go with a different surgeon.  She states that Dr. Johney Maine had wanted her to come up with a $4000 before he would do the surgery   HTN:  Has not taken Norvasc in a while.  No device to check blood pressure but she thinks that her blood pressure has been good. Limits salt in foods.  No chest pains or shortness of breath.  Tob dep:  "I still smoke."  Not ready to quit.   Observations/Objective: No direct observation done   Assessment and Plan: 1. 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 Johney Maine.  We will try to get her in fairly stones then she is having some drainage around the tube.  She wanted me to take a look at the skin around her drainage tube, so I will get her into the office next week to take a look. - Ambulatory referral to General Surgery  2. Essential hypertension Recommend that she restart the amlodipine and continue low-salt diet - amLODipine (NORVASC) 5 MG tablet; Take 1 tablet (5 mg total) by mouth daily.  Dispense: 90 tablet; Refill: 3  3. Tobacco dependence Advised to quit.  Discussed health risks associated with smoking.  Patient not ready to give a trial of quitting.  Less than 5 minutes spent on counseling   Follow Up Instructions: Next week   I discussed the assessment and treatment plan with the patient. The patient was provided an opportunity to ask questions and all were answered. The patient agreed with the plan and demonstrated an understanding of the instructions.   The patient was advised to call back or seek an in-person evaluation if the symptoms worsen or if the condition fails to improve as anticipated.  I provided 15 minutes of non-face-to-face time during this encounter.   Karle Plumber, MD

## 2018-11-16 NOTE — Progress Notes (Signed)
Pt is requesting something for pain

## 2018-11-19 ENCOUNTER — Telehealth: Payer: Self-pay | Admitting: Internal Medicine

## 2018-11-19 NOTE — Telephone Encounter (Signed)
-----   Message from Ena Dawley sent at 11/19/2018  9:20 AM EDT ----- Regarding: RE: try to get her in with her surgeon as soon as possible Good Morning  Noted    Sent referral to [facility] by fax they will contact the patient to schedule an appointment  Ph.# [phone] Fax # [phone] address [place]  ----- Message ----- From: Ladell Pier, MD Sent: 11/16/2018   5:50 PM EDT To: Ena Dawley Subject: try to get her in with her surgeon as soon a#

## 2018-11-21 ENCOUNTER — Other Ambulatory Visit: Payer: Self-pay

## 2018-11-21 ENCOUNTER — Ambulatory Visit: Payer: Medicaid Other | Attending: Internal Medicine | Admitting: Internal Medicine

## 2018-11-21 ENCOUNTER — Encounter: Payer: Self-pay | Admitting: Internal Medicine

## 2018-11-21 VITALS — BP 138/99 | HR 134 | Temp 97.3°F | Wt 113.0 lb

## 2018-11-21 DIAGNOSIS — K5730 Diverticulosis of large intestine without perforation or abscess without bleeding: Secondary | ICD-10-CM | POA: Diagnosis present

## 2018-11-21 DIAGNOSIS — Z79899 Other long term (current) drug therapy: Secondary | ICD-10-CM | POA: Diagnosis not present

## 2018-11-21 DIAGNOSIS — G35 Multiple sclerosis: Secondary | ICD-10-CM | POA: Diagnosis not present

## 2018-11-21 DIAGNOSIS — D649 Anemia, unspecified: Secondary | ICD-10-CM | POA: Insufficient documentation

## 2018-11-21 DIAGNOSIS — F17210 Nicotine dependence, cigarettes, uncomplicated: Secondary | ICD-10-CM | POA: Insufficient documentation

## 2018-11-21 DIAGNOSIS — I10 Essential (primary) hypertension: Secondary | ICD-10-CM | POA: Diagnosis not present

## 2018-11-21 DIAGNOSIS — J449 Chronic obstructive pulmonary disease, unspecified: Secondary | ICD-10-CM | POA: Insufficient documentation

## 2018-11-21 DIAGNOSIS — R000 Tachycardia, unspecified: Secondary | ICD-10-CM

## 2018-11-21 MED ORDER — CARVEDILOL 3.125 MG PO TABS: 3.1250 mg | ORAL_TABLET | Freq: Two times a day (BID) | ORAL | 3 refills | Status: DC

## 2018-11-21 MED FILL — CARVEDILOL 3.125 MG TABLET: 3.125 | 30 days supply | Qty: 60 | Fill #0

## 2018-11-21 NOTE — Progress Notes (Signed)
Patient ID: Christina Miller, female    DOB: 21-Mar-1966  MRN: [id number]  CC:  eval of drainage tube  Subjective:  Christina Miller is a 53 y.o. female who presents for UC visit. 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  She has had a drainage bag in the left lower to supra-pubic area of abdomen since hospitalization in August 2019.  She is awaiting surgery for resection of sigmoid colon.  Since I last spoke with her, she was called by Dr. Clyda Greener office.  They plan to move forward once she has Medicaid card which she should be getting soon.  She came today because she noticed more drainage around tube where it emerges from the skin.  Slight redness around area which she states is now better because she started using an abx cream/wipe.  Drainage bag was changed in March by interventional radiology.  No pain or fever but chronic sore feeling in the abdomen  .HTN:  She has not restarted the Norvasc as yet.  HR noted to be elevated today.  She attributes this to smoking a cigarette before coming into the facility.  She also states HR increases when she gets to moving around trying to do dressing changes.  No wgh changes.  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 coli) ([place]) 03/09/2018   Diverticulitis of large intestine with perforation and abscess 02/24/2018   Protrusion of lumbar intervertebral disc 04/21/2016   Breast lump on left side at 7 o'clock position 06/22/2012   Tobacco abuse 06/02/2011   Perimenopause 06/02/2011   Hematuria, microscopic 06/02/2011     Current Outpatient Medications on File Prior to Visit  Medication Sig Dispense Refill   albuterol (PROVENTIL HFA;VENTOLIN HFA) 108 (90 Base) MCG/ACT inhaler Inhale 2 puffs into the lungs every 6 (six) hours as needed for wheezing or shortness of breath. (Patient taking differently: Inhale 1 puff into the lungs every 6 (six) hours as needed for wheezing or shortness of breath. ) 1 Inhaler 2   amLODipine (NORVASC) 5 MG tablet Take 1 tablet (5 mg total) by mouth daily. 90 tablet 3   No current facility-administered medications on file prior to visit.     Allergies  Allergen Reactions   Hydrocodone-Acetaminophen Nausea And Vomiting     ROS: Review of Systems Negative except as above PHYSICAL EXAM: BP (!) 138/99    Pulse (!) 134    Temp (!) 97.3 F (36.3 C) (Oral)    Wt 113 lb (51.3 kg)    LMP 06/20/2012    SpO2 95%    BMI 21.35 kg/m   Physical Exam  General appearance - alert, well appearing, and in no distress Mental status - normal mood, behavior, speech, dress, motor activity, and thought processes Chest -breath sounds decreased bilaterally Heart -tachycardic with regular rhythm  abdomen -  no abdominal distention.  Bowel sounds sluggish.  Dressing removed from the lower abdomen.  She has a drainage tube that is anchored to the skin with a suture and a wing appearing adhesive around it.  The skin on the lower part of the adhesive is slightly red and irritated.  This area was cleaned with sterile water and dab dry.  I placed a clean 2 x 2 gauze below the lower edge of the adhesive that is anchoring the tube down to the skin to help absorb any  moisture that is accumulating in that area.  Advised the patient to do the same when she does dressing changes and use the triple antibiotic ointment.   Minimal drainage noted in the drainage bag that is attached to her left thigh.    EKG: Sinus tachycardia with rate of 121.  No acute ischemic changes  ASSESSMENT AND PLAN: 1. 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 Overall site from the drainage tube does not appear infected but the skin does appear somewhat irritated I think from the adhesive device that is anchoring the tube to the skin.  See instructions above that was given to the patient regarding dressing change  2. Essential hypertension Not at goal.  Patient has not started taking amlodipine.  Advised her to not take it.  Instead we will use low-dose of carvedilol to help decrease the tachycardia uncontrolled blood pressure  3. Tachycardia - EKG 12-Lead - TSH - carvedilol (COREG) 3.125 MG tablet; Take 1 tablet (3.125 mg total) by mouth 2 (two) times daily with a meal.  Dispense: 60 tablet; Refill: 3  4. Multiple sclerosis ([place]) Patient with history of multiple sclerosis.  We will plan to refer to neurology once she has Medicaid and hand.  Patient was given the opportunity to ask questions.  Patient verbalized understanding of the plan and was able to repeat key elements of the plan.   No orders of the defined types were placed in this encounter.    Requested Prescriptions    No prescriptions requested or ordered in this encounter    No future appointments.  Karle Plumber, MD, FACP

## 2018-11-21 NOTE — Patient Instructions (Signed)
Do dressing changes daily as discussed. I have sent a prescription to your pharmacy for Carvedilol.  This is a blood pressure medication that will help to decrease your heart rate.

## 2018-11-22 LAB — TSH: TSH: 1.82 u[IU]/mL (ref 0.450–4.500)

## 2019-01-22 ENCOUNTER — Ambulatory Visit: Payer: Self-pay | Admitting: Surgery

## 2019-01-22 NOTE — H&P (Signed)
Christina Miller DOB: 30-Jun-1966 Separated / Language: Cleophus Molt / Race: White Female   Patient Care Team: Ladell Pier, MD as PCP - General (Internal Medicine) [name], MD as Consulting Physician (General Surgery) [name], MD as Consulting Physician (Gastroenterology)  ` ` Patient sent for surgical consultation at the request of Dr Silverio Decamp  Chief Complaint: Intra-abdominal abscess. Inflammatory bowel disease versus diverticulitis etiology. ` ` The patient is a pleasant woman with intermittent abdominal pain as that progressed to the point of pain and intra-abdominal abscess. An area of inflammation between her ileum and sigmoid colon. Suspicious for inflammatory bowel disease. Admitted and placed on IV antibiotics. Drainage of the abscess done 03/10/2018. Percutaneous drain. Discharge. Discussions between general surgery and gastroenterology. IBD workup done. No proof of any ileitis or inflammatory bowel disease by endoscopy or biopsy. No major concerns for inflammatory bowel disease on CT enterography. 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 Still there as of late October.   Given the negative inflammatory bowel workup, the assumption he was more diverticulitis. I discussed about colectomy if the rest of the workup was negative. Put in orders a few weeks ago. It has been a challenge to get a hold of her. I believe she's been stressed out by bills and financial issues. Applied for Medicaid in the hospital according to her. That seemed to be lost and fallen through. Reapplying again. Still has the drain. Stitches have pulled through but drain is in place. She's been flushing it. It flushes right back out at the skin which scared her at first. Still some brown drainage into the drainage bag. Not high-volume. She is eating better but appetite not normal. She's moving her bowels every day. Sensitive at the drain site but  otherwise feeling better. Smoking still, but she is trying to back off. She's here with her significant other. Appetite and energy level are not back to normal but certainly improved and when she was in the hospital.  No personal nor family history of GI/colon cancer, inflammatory bowel disease, irritable bowel syndrome, allergy such as Celiac Sprue, dietary/dairy problems, colitis, ulcers nor gastritis. No recent sick contacts/gastroenteritis. No travel outside the country. No changes in diet. No dysphagia to solids or liquids. No significant heartburn or reflux. No hematochezia, hematemesis, coffee ground emesis. No evidence of prior gastric/peptic ulceration.  No new events.  Ready to proceed with surgery  (Review of systems as stated in this history (HPI) or in the review of systems. Otherwise all other 12 point ROS are negative) ` ` `   Medication History (Alisha Spillers, CMA; 06/26/2018 11:17 AM) Medications Reconciled  Vitals (Alisha Spillers CMA; 06/26/2018 11:17 AM) 06/26/2018 11:17 AM Weight: 115 lb Height: 61.5in Body Surface Area: 1.5 m Body Mass Index: 21.38 kg/m  Pulse: 109 (Regular)  BP: 162/82 (Sitting, Left Arm, Standard)       Physical Exam  General Mental Status-Alert. General Appearance-Not in acute distress, Not Sickly. Orientation-Oriented X3. Hydration-Well hydrated. Voice-Normal.  Integumentary Global Assessment Upon inspection and palpation of skin surfaces of the - Axillae: non-tender, no inflammation or ulceration, no drainage. and Distribution of scalp and body hair is normal. General Characteristics Temperature - normal warmth is noted.  Head and Neck Head-normocephalic, atraumatic with no lesions or palpable masses. Face Global Assessment - atraumatic, no absence of expression. Neck Global Assessment - no abnormal movements, no bruit auscultated on the right, no bruit auscultated on the left,  no decreased range of motion, non-tender. Trachea-midline. Thyroid Gland  Characteristics - non-tender.  Eye Eyeball - Left-Extraocular movements intact, No Nystagmus. Eyeball - Right-Extraocular movements intact, No Nystagmus. Cornea - Left-No Hazy. Cornea - Right-No Hazy. Sclera/Conjunctiva - Left-No scleral icterus, No Discharge. Sclera/Conjunctiva - Right-No scleral icterus, No Discharge. Pupil - Left-Direct reaction to light normal. Pupil - Right-Direct reaction to light normal.  ENMT Ears Pinna - Left - no drainage observed, no generalized tenderness observed. Right - no drainage observed, no generalized tenderness observed. Nose and Sinuses External Inspection of the Nose - no destructive lesion observed. Inspection of the nares - Left - quiet respiration. Right - quiet respiration. Mouth and Throat Lips - Upper Lip - no fissures observed, no pallor noted. Lower Lip - no fissures observed, no pallor noted. Nasopharynx - no discharge present. Oral Cavity/Oropharynx - Tongue - no dryness observed. Oral Mucosa - no cyanosis observed. Hypopharynx - no evidence of airway distress observed.  Chest and Lung Exam Inspection Movements - Normal and Symmetrical. Accessory muscles - No use of accessory muscles in breathing. Palpation Palpation of the chest reveals - Non-tender. Auscultation Breath sounds - Normal and Clear.  Cardiovascular Auscultation Rhythm - Regular. Murmurs & Other Heart Sounds - Auscultation of the heart reveals - No Murmurs and No Systolic Clicks.  Abdomen Inspection Inspection of the abdomen reveals - No Visible peristalsis and No Abnormal pulsations. Umbilicus - No Bleeding, No Urine drainage. Palpation/Percussion Palpation and Percussion of the abdomen reveal - Soft, Non Tender, No Rebound tenderness, No Rigidity (guarding) and No Cutaneous hyperesthesia. Note: Drain in left lower quadrant. Stitch pulled through & butterfly dressing  nearly fallen off. Otherwise clean dressing on top of that. I removed everything and cleaned it all up. Replaced with gauze. Thin feculent drainage. Patient but no major cellulitis. There is the abdomen is soft and flat and nontender. No distasis recti. No umbilical or other anterior abdominal wall hernias   Female Genitourinary Sexual Maturity Tanner 5 - Adult hair pattern. Note: No vaginal bleeding nor discharge   Peripheral Vascular Upper Extremity Inspection - Left - No Cyanotic nailbeds, Not Ischemic. Right - No Cyanotic nailbeds, Not Ischemic.  Neurologic Neurologic evaluation reveals -normal attention span and ability to concentrate, able to name objects and repeat phrases. Appropriate fund of knowledge , normal sensation and normal coordination. Mental Status Affect - not angry, not paranoid. Cranial Nerves-Normal Bilaterally. Gait-Normal.  Neuropsychiatric Mental status exam performed with findings of-able to articulate well with normal speech/language, rate, volume and coherence, thought content normal with ability to perform basic computations and apply abstract reasoning and no evidence of hallucinations, delusions, obsessions or homicidal/suicidal ideation.  Musculoskeletal Global Assessment Spine, Ribs and Pelvis - no instability, subluxation or laxity. Right Upper Extremity - no instability, subluxation or laxity.  Lymphatic Head & Neck  General Head & Neck Lymphatics: Bilateral - Description - No Localized lymphadenopathy. Axillary  General Axillary Region: Bilateral - Description - No Localized lymphadenopathy. Femoral & Inguinal  Generalized Femoral & Inguinal Lymphatics: Left - Description - No Localized lymphadenopathy. Right - Description - No Localized lymphadenopathy.    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  I think she would benefit from segmental colonic resection. Minimally invasive robotic sigmoid colectomy. Should be isolated to the point that we can resect and primarily anastomosis. Roddick approach ideally. Recovery pathway.  She is past the point of timing. We could do another drain study but drainage output while low volume seems  feculent. Most likely just leave the drain and remove at the time of surgery. Avoid an extra drain study.  She is very nervous about the cost of all this as she does not have insurance. She applied for Medicaid when she was an inpatient August but it seems like paperwork was loss of she's had to reapply again. Hopefullt that with help to at least cover some of the cost. Otherwise, her likelihood of worsening problems with rehospitalizations and more operations is highly likely. That will be more costly than trying to electively do this more ideal conditions  I encouraged her to quit smoking to minimize the risk of leak or other postoperative complications. She understands. His been hard for her but she is trying to cut back some. We'll give some support information.  The anatomy & physiology of the digestive tract was discussed. The pathophysiology of the colon was discussed. Natural history risks without surgery was discussed. I feel the risks of no intervention will lead to serious problems that outweigh the operative risks; therefore, I recommended a partial colectomy to remove the pathology. Minimally invasive (Robotic/Laparoscopic) & open techniques were discussed.  Risks such as bleeding, infection, abscess, leak, reoperation, possible ostomy, hernia, heart attack, death, and other risks were discussed. I noted a good likelihood this will help address the problem. Goals of post-operative recovery were discussed as well. Need for adequate nutrition, daily bowel regimen and healthy physical activity, to optimize recovery was noted as well. We  will work to minimize complications. Educational materials were available as well. Questions were answered. The patient expresses understanding & wishes to proceed with surgery.   Adin Hector, MD, FACS, MASCRS Gastrointestinal and Minimally Invasive Surgery    [address], [place], [place] [address] [phone] Main / Paging [phone] Fax

## 2019-01-22 NOTE — Progress Notes (Signed)
EKG 11-21-18 Epic EMAIL FOR OSTOMY NURSE CONSULT ON CHART

## 2019-01-22 NOTE — Patient Instructions (Addendum)
YOU NEED TO HAVE A COVID 19 TEST ON 01-26-2019 AT 1130 AM. THIS TEST MUST BE DONE BEFORE SURGERY, COME TO [place] ENTRANCE. ONCE YOUR COVID TEST IS COMPLETED, PLEASE BEGIN THE QUARANTINE INSTRUCTIONS AS OUTLINED IN YOUR HANDOUT.                Christina Miller    Your procedure is scheduled on: 01-30-2019   Report to [facility] Main  Entrance  Report to admitting at 10:15 AM      Call this number if you have problems the morning of surgery [phone]     Remember:  Do not eat food  :After Midnight Monday NIGHT,7/13.   CLEAR LIQUIDS ALL DAY Tuesday .7/14  FOLLOW ALL BOWEL PREP INSTRUCTIONS FROM DR GROSS .DRINK PLENTY OF WATER WITH BOWEL PREP TO PREVENT DEHYDRATION.    BRUSH YOUR TEETH MORNING OF SURGERY AND RINSE YOUR MOUTH OUT, NO CHEWING GUM CANDY OR MINTS.   DRINK 2 PRESURGERY ENSURE DRINKS THE NIGHT BEFORE SURGERY AT  10:00 PM  AND 1 PRESURGERY DRINK THE DAY OF THE PROCEDURE AT 9:15.    NOTHING BY MOUTH EXCEPT CLEAR LIQUIDS UNTIL THREE HOURS PRIOR TO SCHEDULED SURGERY 9:15.   PLEASE FINISH PRESURGERY ENSURE DRINK PER SURGEON ORDER 3 HOURS PRIOR TO SCHEDULED SURGERY TIME WHICH NEEDS TO BE COMPLETED AT 9:15 AM.  CLEAR LIQUID DIET   Foods Allowed                                                                     Foods Excluded  Coffee and tea, regular and decaf                             liquids that you cannot  Plain Jell-O in any flavor                                             see through such as: Fruit ices (not with fruit pulp)                                     milk, soups, orange juice  Iced Popsicles                                    All solid food Carbonated beverages, regular and diet                                    Cranberry, grape and apple juices Sports drinks like Gatorade Lightly seasoned clear broth or consume(fat free) Sugar, honey syrup  Sample Menu Breakfast                                 Lunch  Supper Cranberry juice                    Beef broth                            Chicken broth Jell-O                                     Grape juice                           Apple juice Coffee or tea                        Jell-O                                      Popsicle                                                Coffee or tea                        Coffee or tea  _____________________________________________________________________     Take these medicines the morning of surgery with A SIP OF WATER: Coreg.  Bring your inhaler to the hospital with you DOS                               You may not have any metal on your body including hair pins and              piercings            Do not wear jewelry, make-up, lotions, powders or perfumes, deodorant             Do not wear nail polish.  Do not shave  48 hours prior to surgery.                 Do not bring valuables to the hospital. [place].  Contacts, dentures or bridgework may not be worn into surgery.       _____________________________________________________________________             [facility] - Preparing for Surgery Before surgery, you can play an important role.   Because skin is not sterile, your skin needs to be as free of germs as possible.   You can reduce the number of germs on your skin by washing with CHG (chlorahexidine gluconate) soap before surgery .  CHG is an antiseptic cleaner which kills germs and bonds with the skin to continue killing germs even after washing. Please DO NOT use if you have an allergy to CHG or antibacterial soaps.   If your skin becomes reddened/irritated stop using the CHG and inform your nurse when you arrive at Short Stay. Do not shave (including legs and underarms) for at least 48 hours prior to the first CHG shower.  Please follow  these instructions carefully:  1.  Shower with CHG  Soap the night before surgery and the  morning of Surgery.  2.  If you choose to wash your hair, wash your hair first as usual with your  normal  shampoo.  3.  After you shampoo, rinse your hair and body thoroughly to remove the  shampoo.                                        4.  Use CHG as you would any other liquid soap.  You can apply chg directly  to the skin and wash                       Gently with a scrungie or clean washcloth.  5.  Apply the CHG Soap to your body ONLY FROM THE NECK DOWN.   Do not use on face/ open                           Wound or open sores. Avoid contact with eyes, ears mouth and genitals (private parts).                       Wash face,  Genitals (private parts) with your normal soap.             6.  Wash thoroughly, paying special attention to the area where your surgery  will be performed.  7.  Thoroughly rinse your body with warm water from the neck down.  8.  DO NOT shower/wash with your normal soap after using and rinsing off  the CHG Soap.             9.  Pat yourself dry with a clean towel.            10.  Wear clean pajamas.            11.  Place clean sheets on your bed the night of your first shower and do not  sleep with pets. Day of Surgery : Do not apply any lotions/deodorants the morning of surgery.  Please wear clean clothes to the hospital/surgery center.  FAILURE TO FOLLOW THESE INSTRUCTIONS MAY RESULT IN THE CANCELLATION OF YOUR SURGERY PATIENT SIGNATURE_________________________________  NURSE SIGNATURE__________________________________  ________________________________________________________________________   Christina Miller  An incentive spirometer is a tool that can help keep your lungs clear and active. This tool measures how well you are filling your lungs with each breath. Taking long deep breaths may help reverse or decrease the chance of developing breathing (pulmonary) problems (especially infection) following:  A long  period of time when you are unable to move or be active. BEFORE THE PROCEDURE   If the spirometer includes an indicator to show your best effort, your nurse or respiratory therapist will set it to a desired goal.  If possible, sit up straight or lean slightly forward. Try not to slouch.  Hold the incentive spirometer in an upright position. INSTRUCTIONS FOR USE  1. Sit on the edge of your bed if possible, or sit up as far as you can in bed or on a chair. 2. Hold the incentive spirometer in an upright position. 3. Breathe out normally. 4. Place the mouthpiece in your mouth and seal your lips tightly around it. 5. Breathe in slowly and as  deeply as possible, raising the piston or the ball toward the top of the column. 6. Hold your breath for 3-5 seconds or for as long as possible. Allow the piston or ball to fall to the bottom of the column. 7. Remove the mouthpiece from your mouth and breathe out normally. 8. Rest for a few seconds and repeat Steps 1 through 7 at least 10 times every 1-2 hours when you are awake. Take your time and take a few normal breaths between deep breaths. 9. The spirometer may include an indicator to show your best effort. Use the indicator as a goal to work toward during each repetition. 10. After each set of 10 deep breaths, practice coughing to be sure your lungs are clear. If you have an incision (the cut made at the time of surgery), support your incision when coughing by placing a pillow or rolled up towels firmly against it. Once you are able to get out of bed, walk around indoors and cough well. You may stop using the incentive spirometer when instructed by your caregiver.  RISKS AND COMPLICATIONS  Take your time so you do not get dizzy or light-headed.  If you are in pain, you may need to take or ask for pain medication before doing incentive spirometry. It is harder to take a deep breath if you are having pain. AFTER USE  Rest and breathe slowly and  easily.  It can be helpful to keep track of a log of your progress. Your caregiver can provide you with a simple table to help with this. If you are using the spirometer at home, follow these instructions: [place] IF:   You are having difficultly using the spirometer.  You have trouble using the spirometer as often as instructed.  Your pain medication is not giving enough relief while using the spirometer.  You develop fever of 100.5 F (38.1 C) or higher. SEEK IMMEDIATE MEDICAL CARE IF:   You cough up bloody sputum that had not been present before.  You develop fever of 102 F (38.9 C) or greater.  You develop worsening pain at or near the incision site. MAKE SURE YOU:   Understand these instructions.  Will watch your condition.  Will get help right away if you are not doing well or get worse. Document Released: 11/14/2006 Document Revised: 09/26/2011 Document Reviewed: 01/15/2007 ExitCare Patient Information 2014 ExitCare, [place].   ________________________________________________________________________  WHAT IS A BLOOD TRANSFUSION? Blood Transfusion Information  A transfusion is the replacement of blood or some of its parts. Blood is made up of multiple cells which provide different functions.  Red blood cells carry oxygen and are used for blood loss replacement.  White blood cells fight against infection.  Platelets control bleeding.  Plasma helps clot blood.  Other blood products are available for specialized needs, such as hemophilia or other clotting disorders. BEFORE THE TRANSFUSION  Who gives blood for transfusions?   Healthy volunteers who are fully evaluated to make sure their blood is safe. This is blood bank blood. Transfusion therapy is the safest it has ever been in the practice of medicine. Before blood is taken from a donor, a complete history is taken to make sure that person has no history of diseases nor engages in risky social  behavior (examples are intravenous drug use or sexual activity with multiple partners). The donor's travel history is screened to minimize risk of transmitting infections, such as malaria. The donated blood is tested for signs of infectious  diseases, such as HIV and hepatitis. The blood is then tested to be sure it is compatible with you in order to minimize the chance of a transfusion reaction. If you or a relative donates blood, this is often done in anticipation of surgery and is not appropriate for emergency situations. It takes many days to process the donated blood. RISKS AND COMPLICATIONS Although transfusion therapy is very safe and saves many lives, the main dangers of transfusion include:   Getting an infectious disease.  Developing a transfusion reaction. This is an allergic reaction to something in the blood you were given. Every precaution is taken to prevent this. The decision to have a blood transfusion has been considered carefully by your caregiver before blood is given. Blood is not given unless the benefits outweigh the risks. AFTER THE TRANSFUSION  Right after receiving a blood transfusion, you will usually feel much better and more energetic. This is especially true if your red blood cells have gotten low (anemic). The transfusion raises the level of the red blood cells which carry oxygen, and this usually causes an energy increase.  The nurse administering the transfusion will monitor you carefully for complications. HOME CARE INSTRUCTIONS  No special instructions are needed after a transfusion. You may find your energy is better. Speak with your caregiver about any limitations on activity for underlying diseases you may have. SEEK MEDICAL CARE IF:   Your condition is not improving after your transfusion.  You develop redness or irritation at the intravenous (IV) site. SEEK IMMEDIATE MEDICAL CARE IF:  Any of the following symptoms occur over the next 12 hours:  Shaking  chills.  You have a temperature by mouth above 102 F (38.9 C), not controlled by medicine.  Chest, back, or muscle pain.  People around you feel you are not acting correctly or are confused.  Shortness of breath or difficulty breathing.  Dizziness and fainting.  You get a rash or develop hives.  You have a decrease in urine output.  Your urine turns a dark color or changes to pink, red, or brown. Any of the following symptoms occur over the next 10 days:  You have a temperature by mouth above 102 F (38.9 C), not controlled by medicine.  Shortness of breath.  Weakness after normal activity.  The white part of the eye turns yellow (jaundice).  You have a decrease in the amount of urine or are urinating less often.  Your urine turns a dark color or changes to pink, red, or brown. Document Released: 07/01/2000 Document Revised: 09/26/2011 Document Reviewed: 02/18/2008 [facility] Patient Information 2014 [place], [place].  _______________________________________________________________________

## 2019-01-22 NOTE — H&P (View-Only) (Signed)
Christina Miller DOB: 1965-09-24 Separated / Language: Cleophus Molt / Race: White Female   Patient Care Team: Ladell Pier, MD as PCP - General (Internal Medicine) [name], MD as Consulting Physician (General Surgery) [name], MD as Consulting Physician (Gastroenterology)  ` ` Patient sent for surgical consultation at the request of Dr Silverio Decamp  Chief Complaint: Intra-abdominal abscess. Inflammatory bowel disease versus diverticulitis etiology. ` ` The patient is a pleasant woman with intermittent abdominal pain as that progressed to the point of pain and intra-abdominal abscess. An area of inflammation between her ileum and sigmoid colon. Suspicious for inflammatory bowel disease. Admitted and placed on IV antibiotics. Drainage of the abscess done 03/10/2018. Percutaneous drain. Discharge. Discussions between general surgery and gastroenterology. IBD workup done. No proof of any ileitis or inflammatory bowel disease by endoscopy or biopsy. No major concerns for inflammatory bowel disease on CT enterography. 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 Still there as of late October.   Given the negative inflammatory bowel workup, the assumption he was more diverticulitis. I discussed about colectomy if the rest of the workup was negative. Put in orders a few weeks ago. It has been a challenge to get a hold of her. I believe she's been stressed out by bills and financial issues. Applied for Medicaid in the hospital according to her. That seemed to be lost and fallen through. Reapplying again. Still has the drain. Stitches have pulled through but drain is in place. She's been flushing it. It flushes right back out at the skin which scared her at first. Still some brown drainage into the drainage bag. Not high-volume. She is eating better but appetite not normal. She's moving her bowels every day. Sensitive at the drain site but  otherwise feeling better. Smoking still, but she is trying to back off. She's here with her significant other. Appetite and energy level are not back to normal but certainly improved and when she was in the hospital.  No personal nor family history of GI/colon cancer, inflammatory bowel disease, irritable bowel syndrome, allergy such as Celiac Sprue, dietary/dairy problems, colitis, ulcers nor gastritis. No recent sick contacts/gastroenteritis. No travel outside the country. No changes in diet. No dysphagia to solids or liquids. No significant heartburn or reflux. No hematochezia, hematemesis, coffee ground emesis. No evidence of prior gastric/peptic ulceration.  No new events.  Ready to proceed with surgery  (Review of systems as stated in this history (HPI) or in the review of systems. Otherwise all other 12 point ROS are negative) ` ` `   Medication History (Alisha Spillers, CMA; 06/26/2018 11:17 AM) Medications Reconciled  Vitals (Alisha Spillers CMA; 06/26/2018 11:17 AM) 06/26/2018 11:17 AM Weight: 115 lb Height: 61.5in Body Surface Area: 1.5 m Body Mass Index: 21.38 kg/m  Pulse: 109 (Regular)  BP: 162/82 (Sitting, Left Arm, Standard)       Physical Exam  General Mental Status-Alert. General Appearance-Not in acute distress, Not Sickly. Orientation-Oriented X3. Hydration-Well hydrated. Voice-Normal.  Integumentary Global Assessment Upon inspection and palpation of skin surfaces of the - Axillae: non-tender, no inflammation or ulceration, no drainage. and Distribution of scalp and body hair is normal. General Characteristics Temperature - normal warmth is noted.  Head and Neck Head-normocephalic, atraumatic with no lesions or palpable masses. Face Global Assessment - atraumatic, no absence of expression. Neck Global Assessment - no abnormal movements, no bruit auscultated on the right, no bruit auscultated on the left,  no decreased range of motion, non-tender. Trachea-midline. Thyroid Gland  Characteristics - non-tender.  Eye Eyeball - Left-Extraocular movements intact, No Nystagmus. Eyeball - Right-Extraocular movements intact, No Nystagmus. Cornea - Left-No Hazy. Cornea - Right-No Hazy. Sclera/Conjunctiva - Left-No scleral icterus, No Discharge. Sclera/Conjunctiva - Right-No scleral icterus, No Discharge. Pupil - Left-Direct reaction to light normal. Pupil - Right-Direct reaction to light normal.  ENMT Ears Pinna - Left - no drainage observed, no generalized tenderness observed. Right - no drainage observed, no generalized tenderness observed. Nose and Sinuses External Inspection of the Nose - no destructive lesion observed. Inspection of the nares - Left - quiet respiration. Right - quiet respiration. Mouth and Throat Lips - Upper Lip - no fissures observed, no pallor noted. Lower Lip - no fissures observed, no pallor noted. Nasopharynx - no discharge present. Oral Cavity/Oropharynx - Tongue - no dryness observed. Oral Mucosa - no cyanosis observed. Hypopharynx - no evidence of airway distress observed.  Chest and Lung Exam Inspection Movements - Normal and Symmetrical. Accessory muscles - No use of accessory muscles in breathing. Palpation Palpation of the chest reveals - Non-tender. Auscultation Breath sounds - Normal and Clear.  Cardiovascular Auscultation Rhythm - Regular. Murmurs & Other Heart Sounds - Auscultation of the heart reveals - No Murmurs and No Systolic Clicks.  Abdomen Inspection Inspection of the abdomen reveals - No Visible peristalsis and No Abnormal pulsations. Umbilicus - No Bleeding, No Urine drainage. Palpation/Percussion Palpation and Percussion of the abdomen reveal - Soft, Non Tender, No Rebound tenderness, No Rigidity (guarding) and No Cutaneous hyperesthesia. Note: Drain in left lower quadrant. Stitch pulled through & butterfly dressing  nearly fallen off. Otherwise clean dressing on top of that. I removed everything and cleaned it all up. Replaced with gauze. Thin feculent drainage. Patient but no major cellulitis. There is the abdomen is soft and flat and nontender. No distasis recti. No umbilical or other anterior abdominal wall hernias   Female Genitourinary Sexual Maturity Tanner 5 - Adult hair pattern. Note: No vaginal bleeding nor discharge   Peripheral Vascular Upper Extremity Inspection - Left - No Cyanotic nailbeds, Not Ischemic. Right - No Cyanotic nailbeds, Not Ischemic.  Neurologic Neurologic evaluation reveals -normal attention span and ability to concentrate, able to name objects and repeat phrases. Appropriate fund of knowledge , normal sensation and normal coordination. Mental Status Affect - not angry, not paranoid. Cranial Nerves-Normal Bilaterally. Gait-Normal.  Neuropsychiatric Mental status exam performed with findings of-able to articulate well with normal speech/language, rate, volume and coherence, thought content normal with ability to perform basic computations and apply abstract reasoning and no evidence of hallucinations, delusions, obsessions or homicidal/suicidal ideation.  Musculoskeletal Global Assessment Spine, Ribs and Pelvis - no instability, subluxation or laxity. Right Upper Extremity - no instability, subluxation or laxity.  Lymphatic Head & Neck  General Head & Neck Lymphatics: Bilateral - Description - No Localized lymphadenopathy. Axillary  General Axillary Region: Bilateral - Description - No Localized lymphadenopathy. Femoral & Inguinal  Generalized Femoral & Inguinal Lymphatics: Left - Description - No Localized lymphadenopathy. Right - Description - No Localized lymphadenopathy.    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  I think she would benefit from segmental colonic resection. Minimally invasive robotic sigmoid colectomy. Should be isolated to the point that we can resect and primarily anastomosis. Roddick approach ideally. Recovery pathway.  She is past the point of timing. We could do another drain study but drainage output while low volume seems  feculent. Most likely just leave the drain and remove at the time of surgery. Avoid an extra drain study.  She is very nervous about the cost of all this as she does not have insurance. She applied for Medicaid when she was an inpatient August but it seems like paperwork was loss of she's had to reapply again. [place] that with help to at least cover some of the cost. Otherwise, her likelihood of worsening problems with rehospitalizations and more operations is highly likely. That will be more costly than trying to electively do this more ideal conditions  I encouraged her to quit smoking to minimize the risk of leak or other postoperative complications. She understands. His been hard for her but she is trying to cut back some. We'll give some support information.  The anatomy & physiology of the digestive tract was discussed. The pathophysiology of the colon was discussed. Natural history risks without surgery was discussed. I feel the risks of no intervention will lead to serious problems that outweigh the operative risks; therefore, I recommended a partial colectomy to remove the pathology. Minimally invasive (Robotic/Laparoscopic) & open techniques were discussed.  Risks such as bleeding, infection, abscess, leak, reoperation, possible ostomy, hernia, heart attack, death, and other risks were discussed. I noted a good likelihood this will help address the problem. Goals of post-operative recovery were discussed as well. Need for adequate nutrition, daily bowel regimen and healthy physical activity, to optimize recovery was noted as well. We  will work to minimize complications. Educational materials were available as well. Questions were answered. The patient expresses understanding & wishes to proceed with surgery.   Adin Hector, MD, FACS, MASCRS Gastrointestinal and Minimally Invasive Surgery    [address] [place], [place] [address] [phone] Main / Paging [phone] Fax

## 2019-01-24 ENCOUNTER — Encounter (HOSPITAL_COMMUNITY): Payer: Self-pay

## 2019-01-24 ENCOUNTER — Encounter (HOSPITAL_COMMUNITY)
Admission: RE | Admit: 2019-01-24 | Discharge: 2019-01-24 | Disposition: A | Discharge status: Home / Self Care | Payer: Medicaid Other | Source: Ambulatory Visit | Attending: Surgery | Admitting: Surgery

## 2019-01-24 ENCOUNTER — Other Ambulatory Visit: Payer: Self-pay

## 2019-01-24 DIAGNOSIS — K5720 Diverticulitis of large intestine with perforation and abscess without bleeding: Secondary | ICD-10-CM | POA: Insufficient documentation

## 2019-01-24 DIAGNOSIS — Z01812 Encounter for preprocedural laboratory examination: Secondary | ICD-10-CM | POA: Insufficient documentation

## 2019-01-24 DIAGNOSIS — N739 Female pelvic inflammatory disease, unspecified: Secondary | ICD-10-CM | POA: Diagnosis not present

## 2019-01-24 HISTORY — DX: Myoneural disorder, unspecified: G70.9

## 2019-01-24 LAB — BASIC METABOLIC PANEL
Anion gap: 11 (ref 5–15)
BUN: 12 mg/dL (ref 6–20)
CO2: 27 mmol/L (ref 22–32)
Calcium: 9.2 mg/dL (ref 8.9–10.3)
Chloride: 98 mmol/L (ref 98–111)
Creatinine, Ser: 0.66 mg/dL (ref 0.44–1.00)
GFR calc Af Amer: >60 mL/min (ref >60)
GFR calc non Af Amer: >60 mL/min (ref >60)
Glucose, Bld: 125 mg/dL — ABNORMAL HIGH (ref 70–99)
Potassium: 4.8 mmol/L (ref 3.5–5.1)
Sodium: 136 mmol/L (ref 135–145)

## 2019-01-24 LAB — CBC
HCT: 42.7 % (ref 36.0–46.0)
Hemoglobin: 14.3 g/dL (ref 12.0–15.0)
MCH: 35.3 pg — ABNORMAL HIGH (ref 26.0–34.0)
MCHC: 33.5 g/dL (ref 30.0–36.0)
MCV: 105.4 fL — ABNORMAL HIGH (ref 80.0–100.0)
Platelets: 196 10*3/uL (ref 150–400)
RBC: 4.05 MIL/uL (ref 3.87–5.11)
RDW: 13.2 % (ref 11.5–15.5)
WBC: 12.3 10*3/uL — ABNORMAL HIGH (ref 4.0–10.5)
nRBC: 0 % (ref 0.0–0.2)

## 2019-01-24 MED ORDER — ENSURE PRE-SURGERY PO LIQD
296.0000 mL | Freq: Once | ORAL | Status: DC
  Filled 2019-01-24: qty 296

## 2019-01-24 MED ORDER — ENSURE PRE-SURGERY PO LIQD
592.0000 mL | Freq: Once | ORAL | Status: DC
  Filled 2019-01-24: qty 592

## 2019-01-24 NOTE — Consult Note (Signed)
[place] Nurse requested for preoperative stoma site marking  Discussed surgical procedure and stoma creation with patient and family.  Explained role of the [place] nurse team.  Provided the patient with educational booklet and provided samples of pouching options.  Answered patient and family questions. Patient has an 54 year old nephew with an ostomy and has some familiarity with it.  She is hopeful she will not require one, but understands that our team will assist her in the event she does.   Examined patient sitting, and standing in order to place the marking in the patient's visual field, away from any creases or abdominal contour issues and within the rectus muscle.     Marked for colostomy in the LLQ  5 cm to the left of the umbilicus and  4 cm below the umbilicus.  Marked for ileostomy in the RLQ   5 cm to the right of the umbilicus and  4 cm below the umbilicus.    Patient's abdomen cleansed with CHG wipes at site markings, allowed to air dry prior to marking.Covered mark with thin film transparent dressing to preserve mark until date of surgery.   [place] Nurse team will follow up with patient after surgery for continue ostomy care and teaching.    Domenic Moras MSN, RN, FNP-BC CWON Wound, Ostomy, Continence Nurse Pager [phone]

## 2019-01-25 DIAGNOSIS — Z01812 Encounter for preprocedural laboratory examination: Secondary | ICD-10-CM | POA: Diagnosis not present

## 2019-01-25 LAB — HEMOGLOBIN A1C
Hgb A1c MFr Bld: 4.9 % (ref 4.8–5.6)
Mean Plasma Glucose: 94 mg/dL

## 2019-01-25 LAB — ABO/RH: ABO/RH(D): A POS

## 2019-01-26 ENCOUNTER — Other Ambulatory Visit (HOSPITAL_COMMUNITY)
Admission: RE | Admit: 2019-01-26 | Discharge: 2019-01-26 | Disposition: A | Discharge status: Home / Self Care | Payer: Medicaid Other | Source: Ambulatory Visit | Attending: Surgery | Admitting: Surgery

## 2019-01-26 DIAGNOSIS — Z1159 Encounter for screening for other viral diseases: Secondary | ICD-10-CM | POA: Diagnosis not present

## 2019-01-26 DIAGNOSIS — Z01812 Encounter for preprocedural laboratory examination: Secondary | ICD-10-CM | POA: Insufficient documentation

## 2019-01-26 LAB — SARS CORONAVIRUS 2 (TAT 6-24 HRS): SARS Coronavirus 2: NEGATIVE

## 2019-01-29 MED ORDER — BUPIVACAINE LIPOSOME 1.3 % IJ SUSP
20.0000 mL | Freq: Once | INTRAMUSCULAR | Status: DC
  Filled 2019-01-29: qty 20

## 2019-01-29 MED ORDER — SODIUM CHLORIDE 0.9 % IV SOLN
INTRAVENOUS | Status: DC
  Filled 2019-01-29: qty 6

## 2019-01-30 ENCOUNTER — Inpatient Hospital Stay (HOSPITAL_COMMUNITY): Payer: Medicaid Other | Admitting: Physician Assistant

## 2019-01-30 ENCOUNTER — Other Ambulatory Visit: Payer: Self-pay

## 2019-01-30 ENCOUNTER — Inpatient Hospital Stay (HOSPITAL_COMMUNITY)
Admission: RE | Admit: 2019-01-30 | Discharge: 2019-02-04 | DRG: 335 | Disposition: A | Discharge status: Home / Self Care | Payer: Medicaid Other | Attending: Surgery | Admitting: Surgery

## 2019-01-30 ENCOUNTER — Encounter (HOSPITAL_COMMUNITY): Payer: Self-pay | Admitting: Emergency Medicine

## 2019-01-30 ENCOUNTER — Encounter (HOSPITAL_COMMUNITY): Admission: RE | Disposition: A | Discharge status: Home / Self Care | Payer: Self-pay | Source: Home / Self Care

## 2019-01-30 ENCOUNTER — Inpatient Hospital Stay (HOSPITAL_COMMUNITY): Payer: Medicaid Other | Admitting: Certified Registered Nurse Anesthetist

## 2019-01-30 DIAGNOSIS — K632 Fistula of intestine: Secondary | ICD-10-CM | POA: Diagnosis present

## 2019-01-30 DIAGNOSIS — F17200 Nicotine dependence, unspecified, uncomplicated: Secondary | ICD-10-CM | POA: Diagnosis present

## 2019-01-30 DIAGNOSIS — F411 Generalized anxiety disorder: Secondary | ICD-10-CM | POA: Diagnosis present

## 2019-01-30 DIAGNOSIS — D271 Benign neoplasm of left ovary: Secondary | ICD-10-CM | POA: Diagnosis present

## 2019-01-30 DIAGNOSIS — F17210 Nicotine dependence, cigarettes, uncomplicated: Secondary | ICD-10-CM | POA: Diagnosis present

## 2019-01-30 DIAGNOSIS — K5720 Diverticulitis of large intestine with perforation and abscess without bleeding: Secondary | ICD-10-CM | POA: Diagnosis present

## 2019-01-30 DIAGNOSIS — N739 Female pelvic inflammatory disease, unspecified: Secondary | ICD-10-CM | POA: Diagnosis present

## 2019-01-30 DIAGNOSIS — Z1159 Encounter for screening for other viral diseases: Secondary | ICD-10-CM | POA: Diagnosis not present

## 2019-01-30 DIAGNOSIS — N83312 Acquired atrophy of left ovary: Secondary | ICD-10-CM | POA: Diagnosis present

## 2019-01-30 DIAGNOSIS — Z598 Other problems related to housing and economic circumstances: Secondary | ICD-10-CM

## 2019-01-30 DIAGNOSIS — G8929 Other chronic pain: Secondary | ICD-10-CM | POA: Diagnosis present

## 2019-01-30 DIAGNOSIS — J449 Chronic obstructive pulmonary disease, unspecified: Secondary | ICD-10-CM | POA: Diagnosis present

## 2019-01-30 DIAGNOSIS — Z833 Family history of diabetes mellitus: Secondary | ICD-10-CM | POA: Diagnosis not present

## 2019-01-30 DIAGNOSIS — G35 Multiple sclerosis: Secondary | ICD-10-CM | POA: Diagnosis present

## 2019-01-30 DIAGNOSIS — N736 Female pelvic peritoneal adhesions (postinfective): Secondary | ICD-10-CM | POA: Diagnosis present

## 2019-01-30 DIAGNOSIS — Z801 Family history of malignant neoplasm of trachea, bronchus and lung: Secondary | ICD-10-CM | POA: Diagnosis not present

## 2019-01-30 DIAGNOSIS — K5730 Diverticulosis of large intestine without perforation or abscess without bleeding: Secondary | ICD-10-CM

## 2019-01-30 DIAGNOSIS — K5780 Diverticulitis of intestine, part unspecified, with perforation and abscess without bleeding: Secondary | ICD-10-CM | POA: Diagnosis present

## 2019-01-30 DIAGNOSIS — Z599 Problem related to housing and economic circumstances, unspecified: Secondary | ICD-10-CM

## 2019-01-30 DIAGNOSIS — K651 Peritoneal abscess: Secondary | ICD-10-CM | POA: Diagnosis present

## 2019-01-30 HISTORY — PX: SALPINGOOPHORECTOMY: SHX82

## 2019-01-30 HISTORY — PX: PROCTOSCOPY: SHX2266

## 2019-01-30 HISTORY — PX: ROBOT ASSISTED LAPAROSCOPIC PARTIAL COLECTOMY: SHX6476

## 2019-01-30 LAB — TYPE AND SCREEN
ABO/RH(D): A POS
Antibody Screen: NEGATIVE

## 2019-01-30 SURGERY — COLECTOMY, PARTIAL, ROBOT-ASSISTED, LAPAROSCOPIC
Anesthesia: General | Site: Abdomen

## 2019-01-30 MED ORDER — LIDOCAINE HCL (CARDIAC) PF 100 MG/5ML IV SOSY
PREFILLED_SYRINGE | INTRAVENOUS | Status: DC | PRN
  Administered 2019-01-30: 100 mg via INTRAVENOUS

## 2019-01-30 MED ORDER — 0.9 % SODIUM CHLORIDE (POUR BTL) OPTIME
TOPICAL | Status: DC | PRN
  Administered 2019-01-30: 2000 mL

## 2019-01-30 MED ORDER — DIPHENHYDRAMINE HCL 12.5 MG/5ML PO ELIX: 12.5000 mg | ORAL_SOLUTION | Freq: Four times a day (QID) | ORAL | Status: DC | PRN

## 2019-01-30 MED ORDER — GABAPENTIN 300 MG PO CAPS: 300.0000 mg | ORAL_CAPSULE | Freq: Two times a day (BID) | ORAL | 1 refills | Status: DC

## 2019-01-30 MED ORDER — PROPOFOL 10 MG/ML IV BOLUS
INTRAVENOUS | Status: DC | PRN
  Administered 2019-01-30: 50 mg via INTRAVENOUS
  Administered 2019-01-30: 80 mg via INTRAVENOUS
  Administered 2019-01-30: 20 mg via INTRAVENOUS

## 2019-01-30 MED ORDER — METHYLENE BLUE 0.5 % INJ SOLN
INTRAVENOUS | Status: AC
  Filled 2019-01-30: qty 10

## 2019-01-30 MED ORDER — ALVIMOPAN 12 MG PO CAPS
12.0000 mg | ORAL_CAPSULE | ORAL | Status: AC
  Administered 2019-01-30: 11:00:00 12 mg via ORAL
  Filled 2019-01-30: qty 1

## 2019-01-30 MED ORDER — POLYETHYLENE GLYCOL 3350 17 GM/SCOOP PO POWD: 1.0000 | Freq: Once | ORAL | Status: DC

## 2019-01-30 MED ORDER — ALUM & MAG HYDROXIDE-SIMETH 200-200-20 MG/5ML PO SUSP
30.0000 mL | Freq: Four times a day (QID) | ORAL | Status: DC | PRN
  Administered 2019-02-02 – 2019-02-03 (×3): 30 mL via ORAL
  Filled 2019-01-30 (×3): qty 30

## 2019-01-30 MED ORDER — SODIUM CHLORIDE 0.9% FLUSH
3.0000 mL | Freq: Two times a day (BID) | INTRAVENOUS | Status: DC
  Administered 2019-01-31 – 2019-02-03 (×7): 3 mL via INTRAVENOUS

## 2019-01-30 MED ORDER — HYDRALAZINE HCL 20 MG/ML IJ SOLN
INTRAMUSCULAR | Status: AC
  Filled 2019-01-30: qty 1

## 2019-01-30 MED ORDER — BUPIVACAINE-EPINEPHRINE (PF) 0.25% -1:200000 IJ SOLN
INTRAMUSCULAR | Status: AC
  Filled 2019-01-30: qty 60

## 2019-01-30 MED ORDER — METOPROLOL TARTRATE 5 MG/5ML IV SOLN: 5.0000 mg | Freq: Four times a day (QID) | INTRAVENOUS | Status: DC | PRN

## 2019-01-30 MED ORDER — PROCHLORPERAZINE EDISYLATE 10 MG/2ML IJ SOLN: 5.0000 mg | Freq: Four times a day (QID) | INTRAMUSCULAR | Status: DC | PRN

## 2019-01-30 MED ORDER — CARVEDILOL 3.125 MG PO TABS
3.1250 mg | ORAL_TABLET | Freq: Once | ORAL | Status: AC
  Administered 2019-01-30: 11:00:00 3.125 mg via ORAL
  Filled 2019-01-30: qty 1

## 2019-01-30 MED ORDER — SODIUM CHLORIDE 0.9 % IV SOLN
INTRAVENOUS | Status: DC | PRN
  Administered 2019-01-30: 1000 mL via INTRAPERITONEAL

## 2019-01-30 MED ORDER — KETOROLAC TROMETHAMINE 30 MG/ML IJ SOLN: 30.0000 mg | Freq: Once | INTRAMUSCULAR | Status: DC | PRN

## 2019-01-30 MED ORDER — ONDANSETRON HCL 4 MG/2ML IJ SOLN
INTRAMUSCULAR | Status: AC
  Filled 2019-01-30: qty 2

## 2019-01-30 MED ORDER — NEOMYCIN SULFATE 500 MG PO TABS: 1000.0000 mg | ORAL_TABLET | ORAL | Status: DC

## 2019-01-30 MED ORDER — HYDROMORPHONE HCL 1 MG/ML IJ SOLN
0.5000 mg | INTRAMUSCULAR | Status: DC | PRN
  Administered 2019-01-30: 18:00:00 1 mg via INTRAVENOUS
  Filled 2019-01-30: qty 1

## 2019-01-30 MED ORDER — KETAMINE HCL 10 MG/ML IJ SOLN
INTRAMUSCULAR | Status: DC | PRN
  Administered 2019-01-30: 25 mg via INTRAVENOUS

## 2019-01-30 MED ORDER — LIDOCAINE 2% (20 MG/ML) 5 ML SYRINGE
INTRAMUSCULAR | Status: AC
  Filled 2019-01-30: qty 5

## 2019-01-30 MED ORDER — MIDAZOLAM HCL 2 MG/2ML IJ SOLN
INTRAMUSCULAR | Status: DC | PRN
  Administered 2019-01-30: 2 mg via INTRAVENOUS

## 2019-01-30 MED ORDER — PHENYLEPHRINE 40 MCG/ML (10ML) SYRINGE FOR IV PUSH (FOR BLOOD PRESSURE SUPPORT)
PREFILLED_SYRINGE | INTRAVENOUS | Status: DC | PRN
  Administered 2019-01-30 (×3): 40 ug via INTRAVENOUS
  Administered 2019-01-30: 200 ug via INTRAVENOUS
  Administered 2019-01-30: 40 ug via INTRAVENOUS
  Administered 2019-01-30 (×2): 120 ug via INTRAVENOUS

## 2019-01-30 MED ORDER — ACETAMINOPHEN 500 MG PO TABS
1000.0000 mg | ORAL_TABLET | ORAL | Status: AC
  Administered 2019-01-30: 11:00:00 1000 mg via ORAL
  Filled 2019-01-30: qty 2

## 2019-01-30 MED ORDER — MIDAZOLAM HCL 2 MG/2ML IJ SOLN
INTRAMUSCULAR | Status: AC
  Filled 2019-01-30: qty 2

## 2019-01-30 MED ORDER — ENOXAPARIN SODIUM 40 MG/0.4ML ~~LOC~~ SOLN
40.0000 mg | SUBCUTANEOUS | Status: DC
  Administered 2019-01-31 – 2019-02-04 (×5): 40 mg via SUBCUTANEOUS
  Filled 2019-01-30 (×5): qty 0.4

## 2019-01-30 MED ORDER — FLUORESCEIN SODIUM 10 % IV SOLN
INTRAVENOUS | Status: DC | PRN
  Administered 2019-01-30: 3 mL via INTRAVENOUS

## 2019-01-30 MED ORDER — ALVIMOPAN 12 MG PO CAPS
12.0000 mg | ORAL_CAPSULE | Freq: Two times a day (BID) | ORAL | Status: DC
  Filled 2019-01-30: qty 1

## 2019-01-30 MED ORDER — ALBUTEROL SULFATE (2.5 MG/3ML) 0.083% IN NEBU: 2.5000 mg | INHALATION_SOLUTION | Freq: Four times a day (QID) | RESPIRATORY_TRACT | Status: DC | PRN

## 2019-01-30 MED ORDER — PROCHLORPERAZINE MALEATE 10 MG PO TABS: 10.0000 mg | ORAL_TABLET | Freq: Four times a day (QID) | ORAL | Status: DC | PRN

## 2019-01-30 MED ORDER — BISACODYL 5 MG PO TBEC: 20.0000 mg | DELAYED_RELEASE_TABLET | Freq: Once | ORAL | Status: DC

## 2019-01-30 MED ORDER — HYDRALAZINE HCL 20 MG/ML IJ SOLN
10.0000 mg | Freq: Once | INTRAMUSCULAR | Status: AC
  Administered 2019-01-30: 16:00:00 10 mg via INTRAVENOUS

## 2019-01-30 MED ORDER — TRAMADOL HCL 50 MG PO TABS: 50.0000 mg | ORAL_TABLET | Freq: Four times a day (QID) | ORAL | 0 refills | Status: DC | PRN

## 2019-01-30 MED ORDER — LIP MEDEX EX OINT
1.0000 "application " | TOPICAL_OINTMENT | Freq: Two times a day (BID) | CUTANEOUS | Status: DC
  Administered 2019-01-30 – 2019-02-03 (×9): 1 via TOPICAL
  Filled 2019-01-30: qty 7

## 2019-01-30 MED ORDER — KETAMINE HCL 10 MG/ML IJ SOLN
INTRAMUSCULAR | Status: AC
  Filled 2019-01-30: qty 1

## 2019-01-30 MED ORDER — FENTANYL CITRATE (PF) 250 MCG/5ML IJ SOLN
INTRAMUSCULAR | Status: DC | PRN
  Administered 2019-01-30: 25 ug via INTRAVENOUS
  Administered 2019-01-30 (×2): 50 ug via INTRAVENOUS
  Administered 2019-01-30: 25 ug via INTRAVENOUS
  Administered 2019-01-30 (×2): 50 ug via INTRAVENOUS

## 2019-01-30 MED ORDER — METRONIDAZOLE 500 MG PO TABS: 1000.0000 mg | ORAL_TABLET | ORAL | Status: DC

## 2019-01-30 MED ORDER — SODIUM CHLORIDE 0.9 % IV SOLN
INTRAVENOUS | Status: DC | PRN
  Administered 2019-01-30: 13:00:00 25 ug/min via INTRAVENOUS

## 2019-01-30 MED ORDER — ONDANSETRON HCL 4 MG/2ML IJ SOLN
INTRAMUSCULAR | Status: DC | PRN
  Administered 2019-01-30: 4 mg via INTRAVENOUS

## 2019-01-30 MED ORDER — ACETAMINOPHEN 500 MG PO TABS
1000.0000 mg | ORAL_TABLET | Freq: Four times a day (QID) | ORAL | Status: DC
  Administered 2019-01-30 – 2019-02-04 (×16): 1000 mg via ORAL
  Filled 2019-01-30 (×20): qty 2

## 2019-01-30 MED ORDER — SACCHAROMYCES BOULARDII 250 MG PO CAPS
250.0000 mg | ORAL_CAPSULE | Freq: Two times a day (BID) | ORAL | Status: DC
  Administered 2019-01-30 – 2019-02-04 (×10): 250 mg via ORAL
  Filled 2019-01-30 (×10): qty 1

## 2019-01-30 MED ORDER — LIDOCAINE 2% (20 MG/ML) 5 ML SYRINGE
INTRAMUSCULAR | Status: AC
  Filled 2019-01-30: qty 10

## 2019-01-30 MED ORDER — DIPHENHYDRAMINE HCL 50 MG/ML IJ SOLN: 12.5000 mg | Freq: Four times a day (QID) | INTRAMUSCULAR | Status: DC | PRN

## 2019-01-30 MED ORDER — LIDOCAINE 2% (20 MG/ML) 5 ML SYRINGE
INTRAMUSCULAR | Status: DC | PRN
  Administered 2019-01-30: 14:00:00 via INTRAVENOUS
  Administered 2019-01-30: 1.5 mg/kg/h via INTRAVENOUS

## 2019-01-30 MED ORDER — ROCURONIUM BROMIDE 10 MG/ML (PF) SYRINGE
PREFILLED_SYRINGE | INTRAVENOUS | Status: DC | PRN
  Administered 2019-01-30: 40 mg via INTRAVENOUS
  Administered 2019-01-30: 10 mg via INTRAVENOUS

## 2019-01-30 MED ORDER — SUGAMMADEX SODIUM 200 MG/2ML IV SOLN
INTRAVENOUS | Status: DC | PRN
  Administered 2019-01-30: 100 mg via INTRAVENOUS

## 2019-01-30 MED ORDER — ONDANSETRON HCL 4 MG/2ML IJ SOLN
4.0000 mg | Freq: Four times a day (QID) | INTRAMUSCULAR | Status: DC | PRN
  Administered 2019-01-30: 19:00:00 4 mg via INTRAVENOUS
  Filled 2019-01-30: qty 2

## 2019-01-30 MED ORDER — FENTANYL CITRATE (PF) 100 MCG/2ML IJ SOLN
25.0000 ug | INTRAMUSCULAR | Status: DC | PRN
  Administered 2019-01-30: 17:00:00 50 ug via INTRAVENOUS

## 2019-01-30 MED ORDER — ALBUTEROL SULFATE HFA 108 (90 BASE) MCG/ACT IN AERS: 2.0000 | INHALATION_SPRAY | Freq: Four times a day (QID) | RESPIRATORY_TRACT | Status: DC | PRN

## 2019-01-30 MED ORDER — SODIUM CHLORIDE 0.9 % IV SOLN: Freq: Three times a day (TID) | INTRAVENOUS | Status: DC | PRN

## 2019-01-30 MED ORDER — TRAMADOL HCL 50 MG PO TABS
50.0000 mg | ORAL_TABLET | Freq: Four times a day (QID) | ORAL | Status: DC | PRN
  Administered 2019-01-31: 06:00:00 50 mg via ORAL
  Administered 2019-01-31 – 2019-02-02 (×4): 100 mg via ORAL
  Filled 2019-01-30 (×6): qty 2
  Filled 2019-01-30: qty 1

## 2019-01-30 MED ORDER — ENOXAPARIN SODIUM 40 MG/0.4ML ~~LOC~~ SOLN
40.0000 mg | Freq: Once | SUBCUTANEOUS | Status: AC
  Administered 2019-01-30: 40 mg via SUBCUTANEOUS
  Filled 2019-01-30: qty 0.4

## 2019-01-30 MED ORDER — LACTATED RINGERS IV SOLN
INTRAVENOUS | Status: DC
  Administered 2019-01-30 (×3): via INTRAVENOUS

## 2019-01-30 MED ORDER — ARTIFICIAL TEARS OPHTHALMIC OINT
TOPICAL_OINTMENT | OPHTHALMIC | Status: DC | PRN
  Administered 2019-01-30: 1 via OPHTHALMIC

## 2019-01-30 MED ORDER — MAGIC MOUTHWASH
15.0000 mL | Freq: Four times a day (QID) | ORAL | Status: DC | PRN
  Filled 2019-01-30: qty 15

## 2019-01-30 MED ORDER — ENSURE SURGERY PO LIQD
237.0000 mL | Freq: Two times a day (BID) | ORAL | Status: DC
  Administered 2019-01-31 – 2019-02-04 (×8): 237 mL via ORAL
  Filled 2019-01-30 (×10): qty 237

## 2019-01-30 MED ORDER — DEXAMETHASONE SODIUM PHOSPHATE 10 MG/ML IJ SOLN
INTRAMUSCULAR | Status: DC | PRN
  Administered 2019-01-30: 8 mg via INTRAVENOUS

## 2019-01-30 MED ORDER — HYDRALAZINE HCL 20 MG/ML IJ SOLN: 10.0000 mg | INTRAMUSCULAR | Status: DC | PRN

## 2019-01-30 MED ORDER — PROMETHAZINE HCL 25 MG/ML IJ SOLN: 6.2500 mg | INTRAMUSCULAR | Status: DC | PRN

## 2019-01-30 MED ORDER — GABAPENTIN 300 MG PO CAPS
300.0000 mg | ORAL_CAPSULE | Freq: Two times a day (BID) | ORAL | Status: DC
  Administered 2019-01-30 – 2019-02-03 (×9): 300 mg via ORAL
  Filled 2019-01-30 (×9): qty 1

## 2019-01-30 MED ORDER — LACTATED RINGERS IR SOLN
Status: DC | PRN
  Administered 2019-01-30: 1000 mL

## 2019-01-30 MED ORDER — ONDANSETRON HCL 4 MG PO TABS
4.0000 mg | ORAL_TABLET | Freq: Four times a day (QID) | ORAL | Status: DC | PRN
  Administered 2019-02-02 – 2019-02-03 (×3): 4 mg via ORAL
  Filled 2019-01-30 (×3): qty 1

## 2019-01-30 MED ORDER — CELECOXIB 200 MG PO CAPS
200.0000 mg | ORAL_CAPSULE | ORAL | Status: AC
  Administered 2019-01-30: 11:00:00 200 mg via ORAL
  Filled 2019-01-30: qty 1

## 2019-01-30 MED ORDER — CARVEDILOL 3.125 MG PO TABS
3.1250 mg | ORAL_TABLET | Freq: Two times a day (BID) | ORAL | Status: DC
  Administered 2019-02-02 – 2019-02-04 (×5): 3.125 mg via ORAL
  Filled 2019-01-30 (×6): qty 1

## 2019-01-30 MED ORDER — GABAPENTIN 300 MG PO CAPS
300.0000 mg | ORAL_CAPSULE | ORAL | Status: AC
  Administered 2019-01-30: 11:00:00 300 mg via ORAL
  Filled 2019-01-30: qty 1

## 2019-01-30 MED ORDER — EPHEDRINE SULFATE-NACL 50-0.9 MG/10ML-% IV SOSY
PREFILLED_SYRINGE | INTRAVENOUS | Status: DC | PRN
  Administered 2019-01-30: 10 mg via INTRAVENOUS
  Administered 2019-01-30: 20 mg via INTRAVENOUS

## 2019-01-30 MED ORDER — LACTATED RINGERS IV SOLN: INTRAVENOUS | Status: DC

## 2019-01-30 MED ORDER — NICOTINE 14 MG/24HR TD PT24
14.0000 mg | MEDICATED_PATCH | Freq: Every day | TRANSDERMAL | Status: DC
  Administered 2019-01-30 – 2019-02-03 (×5): 14 mg via TRANSDERMAL
  Filled 2019-01-30 (×6): qty 1

## 2019-01-30 MED ORDER — FENTANYL CITRATE (PF) 100 MCG/2ML IJ SOLN
INTRAMUSCULAR | Status: AC
  Filled 2019-01-30: qty 2

## 2019-01-30 MED ORDER — BUPIVACAINE LIPOSOME 1.3 % IJ SUSP
INTRAMUSCULAR | Status: DC | PRN
  Administered 2019-01-30: 20 mL

## 2019-01-30 MED ORDER — SODIUM CHLORIDE 0.9 % IV SOLN
2.0000 g | Freq: Two times a day (BID) | INTRAVENOUS | Status: AC
  Administered 2019-01-30: 22:00:00 2 g via INTRAVENOUS
  Filled 2019-01-30: qty 2

## 2019-01-30 MED ORDER — SODIUM CHLORIDE 0.9% FLUSH: 3.0000 mL | INTRAVENOUS | Status: DC | PRN

## 2019-01-30 MED ORDER — SODIUM CHLORIDE 0.9 % IV SOLN: 250.0000 mL | INTRAVENOUS | Status: DC | PRN

## 2019-01-30 MED ORDER — FENTANYL CITRATE (PF) 250 MCG/5ML IJ SOLN
INTRAMUSCULAR | Status: AC
  Filled 2019-01-30: qty 5

## 2019-01-30 MED ORDER — SODIUM CHLORIDE 0.9 % IV SOLN
2.0000 g | INTRAVENOUS | Status: AC
  Administered 2019-01-30: 13:00:00 2 g via INTRAVENOUS
  Filled 2019-01-30: qty 2

## 2019-01-30 MED ORDER — BUPIVACAINE-EPINEPHRINE (PF) 0.25% -1:200000 IJ SOLN
INTRAMUSCULAR | Status: DC | PRN
  Administered 2019-01-30: 48 mL

## 2019-01-30 MED ORDER — DEXAMETHASONE SODIUM PHOSPHATE 10 MG/ML IJ SOLN
INTRAMUSCULAR | Status: AC
  Filled 2019-01-30: qty 1

## 2019-01-30 MED FILL — traMADol HCL 50 MG TABS: 50 | 4 days supply | Qty: 30 | Fill #0

## 2019-01-30 MED FILL — GABAPENTIN 300 MG CAPSULE: 300 | 16 days supply | Qty: 60 | Fill #0

## 2019-01-30 SURGICAL SUPPLY — 114 items
APPLIER CLIP 5 13 M/L LIGAMAX5 (MISCELLANEOUS)
APPLIER CLIP ROT 10 11.4 M/L (STAPLE)
APR CLP MED LRG 11.4X10 (STAPLE)
APR CLP MED LRG 5 ANG JAW (MISCELLANEOUS)
BLADE EXTENDED COATED 6.5IN (ELECTRODE) ×4 IMPLANT
CANNULA REDUC XI 12-8 STAPL (CANNULA) ×1
CANNULA REDUC XI 12-8MM STAPL (CANNULA) ×1
CANNULA REDUCER 12-8 DVNC XI (CANNULA) ×2 IMPLANT
CELLS DAT CNTRL 66122 CELL SVR (MISCELLANEOUS) IMPLANT
CLIP APPLIE 5 13 M/L LIGAMAX5 (MISCELLANEOUS) IMPLANT
CLIP APPLIE ROT 10 11.4 M/L (STAPLE) IMPLANT
CLIP VESOLOCK LG 6/CT PURPLE (CLIP) IMPLANT
CLIP VESOLOCK MED LG 6/CT (CLIP) IMPLANT
COVER SURGICAL LIGHT HANDLE (MISCELLANEOUS) ×8 IMPLANT
COVER TIP SHEARS 8 DVNC (MISCELLANEOUS) ×2 IMPLANT
COVER TIP SHEARS 8MM DA VINCI (MISCELLANEOUS) ×2
COVER WAND RF STERILE (DRAPES) IMPLANT
DECANTER SPIKE VIAL GLASS SM (MISCELLANEOUS) ×6 IMPLANT
DEVICE TROCAR PUNCTURE CLOSURE (ENDOMECHANICALS) IMPLANT
DRAIN CHANNEL 19F RND (DRAIN) ×2 IMPLANT
DRAPE ARM DVNC X/XI (DISPOSABLE) ×8 IMPLANT
DRAPE COLUMN DVNC XI (DISPOSABLE) ×2 IMPLANT
DRAPE DA VINCI XI ARM (DISPOSABLE) ×8
DRAPE DA VINCI XI COLUMN (DISPOSABLE) ×2
DRAPE SURG IRRIG POUCH 19X23 (DRAPES) ×4 IMPLANT
DRSG OPSITE POSTOP 4X10 (GAUZE/BANDAGES/DRESSINGS) IMPLANT
DRSG OPSITE POSTOP 4X6 (GAUZE/BANDAGES/DRESSINGS) ×2 IMPLANT
DRSG OPSITE POSTOP 4X8 (GAUZE/BANDAGES/DRESSINGS) IMPLANT
DRSG TEGADERM 2-3/8X2-3/4 SM (GAUZE/BANDAGES/DRESSINGS) ×12 IMPLANT
DRSG TEGADERM 4X4.75 (GAUZE/BANDAGES/DRESSINGS) ×4 IMPLANT
ELECT PENCIL ROCKER SW 15FT (MISCELLANEOUS) ×4 IMPLANT
ELECT REM PT RETURN 15FT ADLT (MISCELLANEOUS) ×4 IMPLANT
ENDOLOOP SUT PDS II  0 18 (SUTURE)
ENDOLOOP SUT PDS II 0 18 (SUTURE) IMPLANT
EVACUATOR SILICONE 100CC (DRAIN) ×2 IMPLANT
GAUZE SPONGE 2X2 8PLY STRL LF (GAUZE/BANDAGES/DRESSINGS) ×2 IMPLANT
GAUZE SPONGE 4X4 12PLY STRL (GAUZE/BANDAGES/DRESSINGS) IMPLANT
GLOVE BIOGEL PI IND STRL 6.5 (GLOVE) IMPLANT
GLOVE BIOGEL PI IND STRL 7.0 (GLOVE) IMPLANT
GLOVE BIOGEL PI INDICATOR 6.5 (GLOVE) ×2
GLOVE BIOGEL PI INDICATOR 7.0 (GLOVE) ×8
GLOVE ECLIPSE 6.5 STRL STRAW (GLOVE) ×4 IMPLANT
GLOVE ECLIPSE 8.0 STRL XLNG CF (GLOVE) ×20 IMPLANT
GLOVE INDICATOR 8.0 STRL GRN (GLOVE) ×20 IMPLANT
GLOVE SURG SS PI 7.0 STRL IVOR (GLOVE) ×4 IMPLANT
GOWN STRL REUS W/ TWL LRG LVL3 (GOWN DISPOSABLE) IMPLANT
GOWN STRL REUS W/TWL 2XL LVL3 (GOWN DISPOSABLE) ×4 IMPLANT
GOWN STRL REUS W/TWL LRG LVL3 (GOWN DISPOSABLE) ×4
GOWN STRL REUS W/TWL XL LVL3 (GOWN DISPOSABLE) ×20 IMPLANT
GRASPER SUT TROCAR 14GX15 (MISCELLANEOUS) ×4 IMPLANT
HOLDER FOLEY CATH W/STRAP (MISCELLANEOUS) ×4 IMPLANT
IRRIG SUCT STRYKERFLOW 2 WTIP (MISCELLANEOUS) ×4
IRRIGATION SUCT STRKRFLW 2 WTP (MISCELLANEOUS) IMPLANT
KIT PROCEDURE DA VINCI SI (MISCELLANEOUS) ×2
KIT PROCEDURE DVNC SI (MISCELLANEOUS) ×2 IMPLANT
KIT SIGMOIDOSCOPE (SET/KITS/TRAYS/PACK) ×2 IMPLANT
KIT TURNOVER KIT A (KITS) IMPLANT
NDL INSUFFLATION 14GA 120MM (NEEDLE) ×2 IMPLANT
NEEDLE INSUFFLATION 14GA 120MM (NEEDLE) ×4 IMPLANT
PACK CARDIOVASCULAR III (CUSTOM PROCEDURE TRAY) ×4 IMPLANT
PACK COLON (CUSTOM PROCEDURE TRAY) ×4 IMPLANT
PAD POSITIONING PINK XL (MISCELLANEOUS) ×4 IMPLANT
PORT LAP GEL ALEXIS MED 5-9CM (MISCELLANEOUS) ×4 IMPLANT
PROTECTOR NERVE ULNAR (MISCELLANEOUS) ×6 IMPLANT
RELOAD STAPLE 45 BLU REG DVNC (STAPLE) IMPLANT
RELOAD STAPLE 45 GRN THCK DVNC (STAPLE) IMPLANT
RETRACTOR WND ALEXIS 18 MED (MISCELLANEOUS) IMPLANT
RTRCTR WOUND ALEXIS 18CM MED (MISCELLANEOUS)
SCISSORS LAP 5X35 DISP (ENDOMECHANICALS) ×4 IMPLANT
SEAL CANN UNIV 5-8 DVNC XI (MISCELLANEOUS) ×8 IMPLANT
SEAL XI 5MM-8MM UNIVERSAL (MISCELLANEOUS) ×8
SEALER VESSEL DA VINCI XI (MISCELLANEOUS) ×2
SEALER VESSEL EXT DVNC XI (MISCELLANEOUS) ×2 IMPLANT
SLEEVE ADV FIXATION 5X100MM (TROCAR) IMPLANT
SOLUTION ELECTROLUBE (MISCELLANEOUS) ×4 IMPLANT
SPONGE GAUZE 2X2 STER 10/PKG (GAUZE/BANDAGES/DRESSINGS) ×2
STAPLER 45 BLU RELOAD XI (STAPLE) IMPLANT
STAPLER 45 BLUE RELOAD XI (STAPLE)
STAPLER 45 GREEN RELOAD XI (STAPLE) ×4
STAPLER 45 GRN RELOAD XI (STAPLE) ×4 IMPLANT
STAPLER CANNULA SEAL DVNC XI (STAPLE) ×2 IMPLANT
STAPLER CANNULA SEAL XI (STAPLE) ×2
STAPLER ECHELON POWER CIR 31 (STAPLE) ×2 IMPLANT
STAPLER SHEATH (SHEATH) ×2
STAPLER SHEATH ENDOWRIST DVNC (SHEATH) ×2 IMPLANT
SURGILUBE 2OZ TUBE FLIPTOP (MISCELLANEOUS) ×4 IMPLANT
SUT MNCRL AB 4-0 PS2 18 (SUTURE) ×6 IMPLANT
SUT PDS AB 1 CT1 27 (SUTURE) ×8 IMPLANT
SUT PDS AB 1 TP1 96 (SUTURE) IMPLANT
SUT PROLENE 0 CT 2 (SUTURE) IMPLANT
SUT PROLENE 2 0 KS (SUTURE) IMPLANT
SUT PROLENE 2 0 SH DA (SUTURE) ×2 IMPLANT
SUT SILK 2 0 (SUTURE)
SUT SILK 2 0 SH CR/8 (SUTURE) ×2 IMPLANT
SUT SILK 2-0 18XBRD TIE 12 (SUTURE) IMPLANT
SUT SILK 3 0 (SUTURE) ×4
SUT SILK 3 0 SH CR/8 (SUTURE) ×4 IMPLANT
SUT SILK 3-0 18XBRD TIE 12 (SUTURE) ×2 IMPLANT
SUT V-LOC BARB 180 2/0GR6 GS22 (SUTURE)
SUT VIC AB 3-0 SH 18 (SUTURE) IMPLANT
SUT VIC AB 3-0 SH 27 (SUTURE)
SUT VIC AB 3-0 SH 27XBRD (SUTURE) IMPLANT
SUT VICRYL 0 UR6 27IN ABS (SUTURE) ×4 IMPLANT
SUTURE V-LC BRB 180 2/0GR6GS22 (SUTURE) IMPLANT
SYR 10ML ECCENTRIC (SYRINGE) ×4 IMPLANT
SYS LAPSCP GELPORT 120MM (MISCELLANEOUS)
SYSTEM LAPSCP GELPORT 120MM (MISCELLANEOUS) IMPLANT
TAPE UMBILICAL COTTON 1/8X30 (MISCELLANEOUS) ×4 IMPLANT
TOWEL OR NON WOVEN STRL DISP B (DISPOSABLE) ×4 IMPLANT
TRAY FOLEY MTR SLVR 14FR STAT (SET/KITS/TRAYS/PACK) ×2 IMPLANT
TROCAR ADV FIXATION 5X100MM (TROCAR) ×4 IMPLANT
TUBING CONNECTING 10 (TUBING) ×6 IMPLANT
TUBING CONNECTING 10' (TUBING) ×2
TUBING INSUFFLATION 10FT LAP (TUBING) ×4 IMPLANT

## 2019-01-30 NOTE — Addendum Note (Signed)
Addendum  created 01/30/19 1751 by Lind Covert, CRNA   Intraprocedure Devices edited

## 2019-01-30 NOTE — Transfer of Care (Signed)
Immediate Anesthesia Transfer of Care Note  Patient: Christina Miller  Procedure(s) Performed: XI ROBOT ASSISTED LOW ANTERIOR RESECTION, MOBILIZATION OF SPLENIC FLEXURE, LEFT SALPINGO OOPHORECTOMY, BILATERAL TAP BLOCK (N/A Abdomen) RIGID PROCTOSCOPY (N/A )  Patient Location: PACU  Anesthesia Type:General  Level of Consciousness: awake, alert , oriented and patient cooperative  Airway & Oxygen Therapy: Patient Spontanous Breathing and Patient connected to face mask oxygen  Post-op Assessment: Report given to RN and Post -op Vital signs reviewed and stable  Post vital signs: Reviewed and stable  Last Vitals:  Vitals Value Taken Time  BP 126/99 01/30/19 1545  Temp    Pulse 93 01/30/19 1545  Resp 13 01/30/19 1545  SpO2 100 % 01/30/19 1545  Vitals shown include unvalidated device data.  Last Pain:  Vitals:   01/30/19 1036  TempSrc:   PainSc: 0-No pain      Patients Stated Pain Goal: 4 (44/83/01 5996)  Complications: No apparent anesthesia complications

## 2019-01-30 NOTE — Anesthesia Postprocedure Evaluation (Signed)
Anesthesia Post Note  Patient: Christina Miller  Procedure(s) Performed: XI ROBOT ASSISTED LOW ANTERIOR RESECTION, MOBILIZATION OF SPLENIC FLEXURE, LEFT SALPINGO OOPHORECTOMY, BILATERAL TAP BLOCK (N/A Abdomen) RIGID PROCTOSCOPY (N/A )     Patient location during evaluation: PACU Anesthesia Type: General Level of consciousness: awake and alert Pain management: pain level controlled Vital Signs Assessment: post-procedure vital signs reviewed and stable Respiratory status: spontaneous breathing, nonlabored ventilation, respiratory function stable and patient connected to nasal cannula oxygen Cardiovascular status: blood pressure returned to baseline and stable Postop Assessment: no apparent nausea or vomiting Anesthetic complications: no    Last Vitals:  Vitals:   01/30/19 1600 01/30/19 1615  BP: (!) 128/106 (!) 126/92  Pulse: 90 92  Resp: 13 13  Temp:    SpO2: 100% 100%    Last Pain:  Vitals:   01/30/19 1600  TempSrc:   PainSc: Asleep                 Acea Yagi S

## 2019-01-30 NOTE — Op Note (Addendum)
01/30/2019  3:34 PM  PATIENT:  Christina Miller  53 y.o. female  Patient Care Team: Ladell Pier, MD as PCP - General (Internal Medicine) [name], MD as Consulting Physician (General Surgery) [name], MD as Consulting Physician (Gastroenterology)  PRE-OPERATIVE DIAGNOSIS:  PELVIC ABSCESS AND FISTULA FROM PERFORATED DIVERTICULITIS  POST-OPERATIVE DIAGNOSIS:  PELVIC ABSCESS AND FISTULA FROM PERFORATED DIVERTICULITIS  PROCEDURE:  XI ROBOT ASSISTED LOW ANTERIOR RESECTION DRAINAGE OF ABSCESS OF ANTERIOR PELVIS & ABDOMINAL WALL MOBILIZATION OF SPLENIC FLEXURE OF COLON LEFT SALPINGO-OOPHORECTOMY ASSESSMENT OF TISSUE PERFUSION WITH FIRELFY IMMUNOFLUORESENCE BILATERAL TAP BLOCK RIGID PROCTOSCOPY  SURGEON:  Adin Hector, MD  ASSISTANT: Leighton Ruff, MD, FACS. An experienced assistant was required given the standard of surgical care given the complexity of the case.  This assistant was needed for exposure, dissection, suctioning, retraction, instrument exchange, etc.  ANESTHESIA:   local and general  Nerve block provided with liposomal bupivacaine (Experel) mixed with 0.25% bupivacaine as a Bilateral TAP block x 52m each side at the level of the transverse abdominis & preperitoneal spaces along the flank at the anterior axillary line, from subcostal ridge to iliac crest under laparoscopic guidance    EBL:  Total I/O In: 2100 [I.V.:2100] Out: 250 [Urine:100; Blood:150]  Delay start of Pharmacological VTE agent (>24hrs) due to surgical blood loss or risk of bleeding:  no  DRAINS: 19 Fr Blake drain goes to the pelvis  SPECIMEN:   Rectosigmoid colon.  Open end proximal.  Distal anastomotic ring of rectum.  Left ovary.  (Fallopian tube en bloc with rectosigmoid colon)  DISPOSITION OF SPECIMENS:  PATHOLOGY  COUNTS:  YES  PLAN OF CARE: Admit to inpatient   PATIENT DISPOSITION:  PACU - hemodynamically stable.  INDICATION:    Woman with severe  ileal and sigmoid colon inflammation with evidence of perforation and abscess requiring drainage and antibiotics.  Developed chronic fistulization.  Severe inflammation of the small bowel to the suspicious for inflammatory bowel disease.  Underwent endoscopic and CT enterography work-up by gastroenterology.  Biopsies negative.  Seem more consistent for diverticulitis.  I recommended segmental resection.  Surgery was delayed due to financial and COVID issues but eventually cleared and patient ready to proceed with surgery:  The anatomy & physiology of the digestive tract was discussed.  The pathophysiology was discussed.  Natural history risks without surgery was discussed.   I worked to give an overview of the disease and the frequent need to have multispecialty involvement.  I feel the risks of no intervention will lead to serious problems that outweigh the operative risks; therefore, I recommended a partial colectomy to remove the pathology.  Laparoscopic & open techniques were discussed.    Risks such as bleeding, infection, abscess, leak, reoperation, possible ostomy, hernia, heart attack, death, and other risks were discussed.  I noted a good likelihood this will help address the problem.   Goals of post-operative recovery were discussed as well.  We will work to minimize complications.  Educational materials on the pathology had been given in the office.  Questions were answered.    The patient expressed understanding & wished to proceed with surgery.  OR FINDINGS:   Elective  PERITONITIS - FOCAL ABSCESS  Focal peritonitis with purulent abscess in abdominal wall and walled off in pelvis.  Patient had severely inflamed horseshoe rectosigmoid plastered to the ovary, salpinx and uterus as well as ureter and gonadal vessels on the left side walling off a chronic abscess around a chronic drain.  No obvious metastatic disease on visceral parietal peritoneum or liver.  The anastomosis rests 9-10 cm  from the anal verge by rigid proctoscopy.  Away from abscess and protected with greater omentum   DESCRIPTION:   Informed consent was confirmed.  The patient underwent general anaesthesia without difficulty.  The patient was positioned appropriately.  VTE prevention in place.  I freed off the tubing to the drain.  The patient was clipped, prepped, & draped in a sterile fashion.  The drain actually fell out.  Surgical timeout confirmed our plan.  The patient was positioned in reverse Trendelenburg.  Abdominal entry was gained using Varess technique at the left subcostal ridge on the anterior abdominal wall.  No elevated EtCO2 noted.  Port placed.  Camera inspection revealed no injury.  Extra ports were carefully placed under direct laparoscopic visualization.  Greater omentum was densely adherent to the left anterior pelvis but the small bowel was able to fall out of the pelvis into the upper abdomen.  The patient was carefully positioned.  The Intuitive daVinci robot was docked with camera & instruments carefully placed.  The patient had obvious significant phlegmon in the left lower quadrant anterior pelvis.  I transected the greater omentum off its attachments to the rectosigmoid colon allow that to reduce into the upper abdomen.  I decided to do a medial lateral approach.  The left colon mesentery was foreshortened due to chronic inflammation.  I scored the base of peritoneum of the medial side of the mesentery of the elevated left colon from the ligament of Treitz to the peritoneal reflection of the mid rectum.   I elevated the sigmoid mesentery and entered into the retro-mesenteric plane. We were able to identify the left ureter and gonadal vessels. We kept those posterior within the retroperitoneum and elevated the left colon mesentery off that. I did isolate the inferior mesenteric artery (IMA) pedicle but did not ligate it yet.  I continued distally and got into the avascular plane posterior to the  mesorectum. This allowed me to help mobilize the rectum as well by freeing the mesorectum off the sacrum.   I skeletonized the lymph nodes off the inferior mesenteric artery pedicle.  I went down to its takeoff from the aorta.   I isolated the inferior mesenteric vein off of the ligament of Treitz just cephalad to that as well.  After confirming the left ureter was out of the way, I went ahead and ligated the inferior mesenteric artery pedicle just near its takeoff from the aorta.  I did ligate the inferior mesenteric vein in a similar fashion.  We ensured hemostasis.  I then mobilized the entire left colon off the retroperitoneum including the ureter, gonadal's, left kidney up towards the splenic flexure.  I then focused down towards the phlegmon in the pelvis.  Mobilized the descending colon in a lateral medial fashion coming towards the sigmoid colon.  There was dense inflammatory phlegmon adhesions to the left ureter and gonadal vessels along the left retroperitoneum and anterolateral pelvic sidewall.  I switched over to sharp scissors and dissected along the ureter robotically underneath.  Aorta on the side of going into the mesocolon and mesorectum to avoid ureteral injury.  I then came around to the mid rectum on the left side and came more proximally and attempt to try and free it off the uterus.  Final I can identify the atrophic ovary.  The fallopian tube was extremely adherent and incorporated into the region.  After some  sharp dissection encountered into the chronic abscess cavity where the drain had been with some purulence released.  This was carefully aspirated and washed out.  Debrided sharply free off the chronic abscess cavity off the left pelvic and anterior sidewall.  Fallopian tube cannot be saved so I took that en bloc with that.  We that we finally could release the rectosigmoid colon off the pelvis and from the down get to healthier mid rectum.  Proximal colon was densely adherent and  inflamed with this chronic area.  The left ovary cannot be saved so I transected it off the anterior pelvis and abscess cavity as well as the remaining fallopian tube off the uterus using vessel sealer  Mobilize the mesorectum off the presacral plane.  I skeletonized and transected the mesorectum at the proximal rectum and then freed that down for towards the mid rectum to help preserve mesorectum and get to an area of the rectum that was soft and not inflamed.  I mobilized the left colon in a lateral to medial fashion off the line of Toldt up towards the splenic flexure to ensure good mobilization of the remaining left colon to reach into the pelvis.  However was obvious that the left colon was not going to reach down.  Therefore I focused on mobilization of splenic flexure of the colon.  We repositioned the patient in reverse Trendelenburg.  I transected the greater omentum off the mid transverse colon was actually quite stretched.  Freddrick March that off towards the splenic flexure.  Then freed the distal half of the transverse colon off its retroperitoneal attachments along the inferior pancreatic edge coming back towards the middle colic pedicle.  With that we had complete mobilization of the left colon off the retroperitoneum and laterally and superiorly.  He could reach down the pelvis much better.  I chose an area in the descending colon that would reach down to the lower pelvis since I had to go more distally than initially anticipated.  Transected the mesentery radially to preserve a good pedicle and marginal artery.  Then had anesthesia intravenously injected Firefly diluted green immunofluorescence.  Reviewed the tissue effusion under that window and saw excellent bright green perfusion of the colon proximal to the land transection point.  Also mid and distal rectum had excellent perfusion as well.  This argued against any poor blood supply  I skeletonized at the proximal mesorectum and transected at the  proximal rectum using a robotic 45 mm stapler.  I chose a region at the descending/sigmoid junction that was soft and easily reached down to the rectal stump.  90% across the first firing.  Did a second firing to get the last posterior inferior corner.  We did irrigation and ensured good hemostasis on the tissues with no abnormalities.  Small bowel showed no inflammation.  There was a long adhesion going from the cecum to the rectosigmoid colon that we transected off and removed.  I created an extraction incision through a small Pfannenstiel incision in the suprapubic region.  Placed a wound protector.  I was able to eviscerate the rectosigmoid and descending colon out the wound.   I clamped the colon proximal to this area using a reusable pursestringer device.  Passed a 2-0 Keith needle. I transected at the descending/sigmoid junction with a scalpel. I got healthy bleeding mucosa.  We sent the rectosigmoid colon specimen off to go to pathology.  We sized the colon orifice.  I chose a 31 EEA anvil stapler system.  I reinforced the prolene pursestring with interrupted silk suture.  I placed the anvil to the open end of the proximal remaining colon and closed around it using the pursestring.    We did copious irrigation with crystalloid solution.  Hemostasis was good.  The distal end of the remaining colon easily reached down to the rectal stump.  Dr Marcello Moores scrubbed down and did gentle anal dilation and advanced the EEA stapler up the rectal stump. The spike was brought out at the provimal end of the rectal stump under direct visualization.  I attached the anvil of the proximal colon to the spike of the stapler. Anvil was tightened down and held clamped for 60 seconds. The EEA stapler was fired and held clamped for 30 seconds. The stapler was released & removed. We noted 2 excellent anastomotic rings. Blue stitch is in the proximal ring.  Dr Marcello Moores did rigid proctoscopy noted the anastomosis was at 9 cm from the  anal verge consistent with the proximal rectum.  EEA anastomosis barely palpable at the tip of the finger.  We did a final irrigation of antibiotic solution (900 mg clindamycin/240 mg gentamicin in a liter of crystalloid) & held that for the pelvic air leak test .  The rectum was insufflated the rectum while clamping the colon proximal to that anastomosis.  There was a negative air leak test. There was no tension of mesentery or bowel at the anastomosis.   Tissues looked viable.  Ureters & bowel uninjured.  The anastomosis looked healthy.  Endoluminal gas was evacuated.  Ports & wound protector removed.  We changed gloves & redraped the patient per colon SSI prevention protocol. Sterile unused instruments were used from this point.  We aspirated the antibiotic irrigation.  Drain carefully placed.  Secured at the skin with 2-0 Prolene suture with exit site on 1 of the right-sided port sites  Hemostasis was good.   I closed the skin at the port sites using Monocryl stitch and sterile dressing.  I closed the extraction wound using a 0 Vicryl vertical peritoneal closure and a #1 PDS transverse anterior rectal fascial closure like a small Pfannenstiel closure. I closed the skin with some interrupted Monocryl stitches. I placed antibiotic-soaked wicks into the closure at the corners x 2.  I placed sterile dressings.  Then excised the chronically inflamed drain site in the left lower quadrant with a 2 x 1 biconcave curvilinear defect.  I excised the chronic drain tube tract down to the level of fascia.  Left the wound open and packed with antibiotic soaked wick and sterile dressing.     Patient is being extubated go to recovery room. I had discussed postop care with the patient in detail the office & in the holding area. Instructions are written. I discussed operative findings, updated the patient's status, discussed probable steps to recovery, and gave postoperative recommendations to the patient's significant other.   Merryl Hacker.   Recommendations were made.  Questions were answered.  He expressed understanding & appreciation.   Adin Hector, M.D., F.A.C.S. Gastrointestinal and Minimally Invasive Surgery [facility] [address] [place], [place] [id number] [phone] Main / Paging

## 2019-01-30 NOTE — Anesthesia Procedure Notes (Signed)
Procedure Name: Intubation Date/Time: 01/30/2019 12:28 PM Performed by: Raenette Rover, CRNA Pre-anesthesia Checklist: Patient identified, Emergency Drugs available, Suction available and Patient being monitored Patient Re-evaluated:Patient Re-evaluated prior to induction Oxygen Delivery Method: Circle system utilized Preoxygenation: Pre-oxygenation with 100% oxygen Induction Type: IV induction Ventilation: Mask ventilation without difficulty Laryngoscope Size: Mac and 3 Grade View: Grade I Tube type: Oral Tube size: 7.0 mm Number of attempts: 1 Airway Equipment and Method: Stylet Placement Confirmation: ETT inserted through vocal cords under direct vision,  CO2 detector,  positive ETCO2 and breath sounds checked- equal and bilateral Secured at: 21 cm Tube secured with: Tape Dental Injury: Teeth and Oropharynx as per pre-operative assessment

## 2019-01-30 NOTE — Anesthesia Preprocedure Evaluation (Signed)
Anesthesia Evaluation  Patient identified by MRN, date of birth, ID band Patient awake    Reviewed: Allergy & Precautions, NPO status , Patient's Chart, lab work & pertinent test results  Airway Mallampati: II  TM Distance: >3 FB Neck ROM: Full    Dental no notable dental hx.    Pulmonary COPD, Current Smoker,    Pulmonary exam normal breath sounds clear to auscultation       Cardiovascular hypertension, Normal cardiovascular exam Rhythm:Regular Rate:Normal     Neuro/Psych MS  Neuromuscular disease negative psych ROS   GI/Hepatic negative GI ROS, Neg liver ROS,   Endo/Other  negative endocrine ROS  Renal/GU negative Renal ROS  negative genitourinary   Musculoskeletal negative musculoskeletal ROS (+)   Abdominal   Peds negative pediatric ROS (+)  Hematology negative hematology ROS (+)   Anesthesia Other Findings   Reproductive/Obstetrics negative OB ROS                             Anesthesia Physical Anesthesia Plan  ASA: III  Anesthesia Plan: General   Post-op Pain Management:    Induction: Intravenous  PONV Risk Score and Plan: 3 and Ondansetron, Dexamethasone, Scopolamine patch - Pre-op, Treatment may vary due to age or medical condition and Midazolam  Airway Management Planned: Oral ETT  Additional Equipment:   Intra-op Plan:   Post-operative Plan: Extubation in OR  Informed Consent: I have reviewed the patients History and Physical, chart, labs and discussed the procedure including the risks, benefits and alternatives for the proposed anesthesia with the patient or authorized representative who has indicated his/her understanding and acceptance.     Dental advisory given  Plan Discussed with: CRNA and Surgeon  Anesthesia Plan Comments:         Anesthesia Quick Evaluation

## 2019-01-30 NOTE — Discharge Instructions (Signed)
SURGERY: POST OP INSTRUCTIONS °(Surgery for small bowel obstruction, colon resection, etc) ° ° °###################################################################### ° °EAT °Gradually transition to a high fiber diet with a fiber supplement over the next few days after discharge ° °WALK °Walk an hour a day.  Control your pain to do that.   ° °CONTROL PAIN °Control pain so that you can walk, sleep, tolerate sneezing/coughing, go up/down stairs. ° °HAVE A BOWEL MOVEMENT DAILY °Keep your bowels regular to avoid problems.  OK to try a laxative to override constipation.  OK to use an antidairrheal to slow down diarrhea.  Call if not better after 2 tries ° °CALL IF YOU HAVE PROBLEMS/CONCERNS °Call if you are still struggling despite following these instructions. °Call if you have concerns not answered by these instructions ° °###################################################################### ° ° °DIET °Follow a light diet the first few days at home.  Start with a bland diet such as soups, liquids, starchy foods, low fat foods, etc.  If you feel full, bloated, or constipated, stay on a ful liquid or pureed/blenderized diet for a few days until you feel better and no longer constipated. °Be sure to drink plenty of fluids every day to avoid getting dehydrated (feeling dizzy, not urinating, etc.). °Gradually add a fiber supplement to your diet over the next week.  Gradually get back to a regular solid diet.  Avoid fast food or heavy meals the first week as you are more likely to get nauseated. °It is expected for your digestive tract to need a few months to get back to normal.  It is common for your bowel movements and stools to be irregular.  You will have occasional bloating and cramping that should eventually fade away.  Until you are eating solid food normally, off all pain medications, and back to regular activities; your bowels will not be normal. °Focus on eating a low-fat, high fiber diet the rest of your life  (See Getting to Good Bowel Health, below). ° °CARE of your INCISION or WOUND °It is good for closed incision and even open wounds to be washed every day.  Shower every day.  Short baths are fine.  Wash the incisions and wounds clean with soap & water.    °If you have a closed incision(s), wash the incision with soap & water every day.  You may leave closed incisions open to air if it is dry.   You may cover the incision with clean gauze & replace it after your daily shower for comfort. °If you have skin tapes (Steristrips) or skin glue (Dermabond) on your incision, leave them in place.  They will fall off on their own like a scab.  You may trim any edges that curl up with clean scissors.  If you have staples, set up an appointment for them to be removed in the office in 10 days after surgery.  °If you have a drain, wash around the skin exit site with soap & water and place a new dressing of gauze or band aid around the skin every day.  Keep the drain site clean & dry.    °If you have an open wound with packing, see wound care instructions.  In general, it is encouraged that you remove your dressing and packing, shower with soap & water, and replace your dressing once a day.  Pack the wound with clean gauze moistened with normal (0.9%) saline to keep the wound moist & uninfected.  Pressure on the dressing for 30 minutes will stop most wound   bleeding.  Eventually your body will heal & pull the open wound closed over the next few months.  °Raw open wounds will occasionally bleed or secrete yellow drainage until it heals closed.  Drain sites will drain a little until the drain is removed.  Even closed incisions can have mild bleeding or drainage the first few days until the skin edges scab over & seal.   °If you have an open wound with a wound vac, see wound vac care instructions. ° ° ° ° °ACTIVITIES as tolerated °Start light daily activities --- self-care, walking, climbing stairs-- beginning the day after surgery.   Gradually increase activities as tolerated.  Control your pain to be active.  Stop when you are tired.  Ideally, walk several times a day, eventually an hour a day.   °Most people are back to most day-to-day activities in a few weeks.  It takes 4-8 weeks to get back to unrestricted, intense activity. °If you can walk 30 minutes without difficulty, it is safe to try more intense activity such as jogging, treadmill, bicycling, low-impact aerobics, swimming, etc. °Save the most intensive and strenuous activity for last (Usually 4-8 weeks after surgery) such as sit-ups, heavy lifting, contact sports, etc.  Refrain from any intense heavy lifting or straining until you are off narcotics for pain control.  You will have off days, but things should improve week-by-week. °DO NOT PUSH THROUGH PAIN.  Let pain be your guide: If it hurts to do something, don't do it.  Pain is your body warning you to avoid that activity for another week until the pain goes down. °You may drive when you are no longer taking narcotic prescription pain medication, you can comfortably wear a seatbelt, and you can safely make sudden turns/stops to protect yourself without hesitating due to pain. °You may have sexual intercourse when it is comfortable. If it hurts to do something, stop. ° °MEDICATIONS °Take your usually prescribed home medications unless otherwise directed.   °Blood thinners:  °Usually you can restart any strong blood thinners after the second postoperative day.  It is OK to take aspirin right away.    ° If you are on strong blood thinners (warfarin/Coumadin, Plavix, Xerelto, Eliquis, Pradaxa, etc), discuss with your surgeon, medicine PCP, and/or cardiologist for instructions on when to restart the blood thinner & if blood monitoring is needed (PT/INR blood check, etc).   ° ° °PAIN CONTROL °Pain after surgery or related to activity is often due to strain/injury to muscle, tendon, nerves and/or incisions.  This pain is usually  short-term and will improve in a few months.  °To help speed the process of healing and to get back to regular activity more quickly, DO THE FOLLOWING THINGS TOGETHER: °1. Increase activity gradually.  DO NOT PUSH THROUGH PAIN °2. Use Ice and/or Heat °3. Try Gentle Massage and/or Stretching °4. Take over the counter pain medication °5. Take Narcotic prescription pain medication for more severe pain ° °Good pain control = faster recovery.  It is better to take more medicine to be more active than to stay in bed all day to avoid medications. °1.  Increase activity gradually °Avoid heavy lifting at first, then increase to lifting as tolerated over the next 6 weeks. °Do not “push through” the pain.  Listen to your body and avoid positions and maneuvers than reproduce the pain.  Wait a few days before trying something more intense °Walking an hour a day is encouraged to help your body recover faster   and more safely.  Start slowly and stop when getting sore.  If you can walk 30 minutes without stopping or pain, you can try more intense activity (running, jogging, aerobics, cycling, swimming, treadmill, sex, sports, weightlifting, etc.) °Remember: If it hurts to do it, then don’t do it! °2. Use Ice and/or Heat °You will have swelling and bruising around the incisions.  This will take several weeks to resolve. °Ice packs or heating pads (6-8 times a day, 30-60 minutes at a time) will help sooth soreness & bruising. °Some people prefer to use ice alone, heat alone, or alternate between ice & heat.  Experiment and see what works best for you.  Consider trying ice for the first few days to help decrease swelling and bruising; then, switch to heat to help relax sore spots and speed recovery. °Shower every day.  Short baths are fine.  It feels good!  Keep the incisions and wounds clean with soap & water.   °3. Try Gentle Massage and/or Stretching °Massage at the area of pain many times a day °Stop if you feel pain - do not  overdo it °4. Take over the counter pain medication °This helps the muscle and nerve tissues become less irritable and calm down faster °Choose ONE of the following over-the-counter anti-inflammatory medications: °Acetaminophen 500mg tabs (Tylenol) 1-2 pills with every meal and just before bedtime (avoid if you have liver problems or if you have acetaminophen in you narcotic prescription) °Naproxen 220mg tabs (ex. Aleve, Naprosyn) 1-2 pills twice a day (avoid if you have kidney, stomach, IBD, or bleeding problems) °Ibuprofen 200mg tabs (ex. Advil, Motrin) 3-4 pills with every meal and just before bedtime (avoid if you have kidney, stomach, IBD, or bleeding problems) °Take with food/snack several times a day as directed for at least 2 weeks to help keep pain / soreness down & more manageable. °5. Take Narcotic prescription pain medication for more severe pain °A prescription for strong pain control is often given to you upon discharge (for example: oxycodone/Percocet, hydrocodone/Norco/Vicodin, or tramadol/Ultram) °Take your pain medication as prescribed. °Be mindful that most narcotic prescriptions contain Tylenol (acetaminophen) as well - avoid taking too much Tylenol. °If you are having problems/concerns with the prescription medicine (does not control pain, nausea, vomiting, rash, itching, etc.), please call us [phone] to see if we need to switch you to a different pain medicine that will work better for you and/or control your side effects better. °If you need a refill on your pain medication, you must call the office before 4 pm and on weekdays only.  By federal law, prescriptions for narcotics cannot be called into a pharmacy.  They must be filled out on paper & picked up from our office by the patient or authorized caretaker.  Prescriptions cannot be filled after 4 pm nor on weekends.   ° °WHEN TO CALL US [phone] °Severe uncontrolled or worsening pain  °Fever over 101 F (38.5 C) °Concerns with  the incision: Worsening pain, redness, rash/hives, swelling, bleeding, or drainage °Reactions / problems with new medications (itching, rash, hives, nausea, etc.) °Nausea and/or vomiting °Difficulty urinating °Difficulty breathing °Worsening fatigue, dizziness, lightheadedness, blurred vision °Other concerns °If you are not getting better after two weeks or are noticing you are getting worse, contact our office [phone] for further advice.  We may need to adjust your medications, re-evaluate you in the office, send you to the emergency room, or see what other things we can do to help. °The   clinic staff is available to answer your questions during regular business hours (8:30am-5pm).  Please don’t hesitate to call and ask to speak to one of our nurses for clinical concerns.    °A surgeon from [facility] is always on call at the hospitals 24 hours/day °If you have a medical emergency, go to the nearest emergency room or call 911. ° °FOLLOW UP in our office °One the day of your discharge from the hospital (or the next business weekday), please call [facility] to set up or confirm an appointment to see your surgeon in the office for a follow-up appointment.  Usually it is 2-3 weeks after your surgery.   °If you have skin staples at your incision(s), let the office know so we can set up a time in the office for the nurse to remove them (usually around 10 days after surgery). °Make sure that you call for appointments the day of discharge (or the next business weekday) from the hospital to ensure a convenient appointment time. °IF YOU HAVE DISABILITY OR FAMILY LEAVE FORMS, BRING THEM TO THE OFFICE FOR PROCESSING.  DO NOT GIVE THEM TO YOUR DOCTOR. ° °[facility] °[address], [address], [place], [place]  [address] ? °[phone] - Main °[phone] - Toll Free,  [phone] - Fax °[email or web address] ° °GETTING TO GOOD BOWEL HEALTH. °It is  expected for your digestive tract to need a few months to get back to normal.  It is common for your bowel movements and stools to be irregular.  You will have occasional bloating and cramping that should eventually fade away.  Until you are eating solid food normally, off all pain medications, and back to regular activities; your bowels will not be normal.   °Avoiding constipation °The goal: ONE SOFT BOWEL MOVEMENT A DAY!    °Drink plenty of fluids.  Choose water first. °TAKE A FIBER SUPPLEMENT EVERY DAY THE REST OF YOUR LIFE °During your first week back home, gradually add back a fiber supplement every day °Experiment which form you can tolerate.   There are many forms such as powders, tablets, wafers, gummies, etc °Psyllium bran (Metamucil), methylcellulose (Citrucel), Miralax or Glycolax, Benefiber, Flax Seed.  °Adjust the dose week-by-week (1/2 dose/day to 6 doses a day) until you are moving your bowels 1-2 times a day.  Cut back the dose or try a different fiber product if it is giving you problems such as diarrhea or bloating. °Sometimes a laxative is needed to help jump-start bowels if constipated until the fiber supplement can help regulate your bowels.  If you are tolerating eating & you are farting, it is okay to try a gentle laxative such as double dose MiraLax, prune juice, or Milk of Magnesia.  Avoid using laxatives too often. °Stool softeners can sometimes help counteract the constipating effects of narcotic pain medicines.  It can also cause diarrhea, so avoid using for too long. °If you are still constipated despite taking fiber daily, eating solids, and a few doses of laxatives, call our office. °Controlling diarrhea °Try drinking liquids and eating bland foods for a few days to avoid stressing your intestines further. °Avoid dairy products (especially milk & ice cream) for a short time.  The intestines often can lose the ability to digest lactose when stressed. °Avoid foods that cause gassiness or  bloating.  Typical foods include beans and other legumes, cabbage, broccoli, and dairy foods.  Avoid greasy, spicy, fast foods.  Every person has   some sensitivity to other foods, so listen to your body and avoid those foods that trigger problems for you. °Probiotics (such as active yogurt, Align, etc) may help repopulate the intestines and colon with normal bacteria and calm down a sensitive digestive tract °Adding a fiber supplement gradually can help thicken stools by absorbing excess fluid and retrain the intestines to act more normally.  Slowly increase the dose over a few weeks.  Too much fiber too soon can backfire and cause cramping & bloating. °It is okay to try and slow down diarrhea with a few doses of antidiarrheal medicines.   °Bismuth subsalicylate (ex. Kayopectate, Pepto Bismol) for a few doses can help control diarrhea.  Avoid if pregnant.   °Loperamide (Imodium) can slow down diarrhea.  Start with one tablet (2mg) first.  Avoid if you are having fevers or severe pain.  °ILEOSTOMY PATIENTS WILL HAVE CHRONIC DIARRHEA since their colon is not in use.    °Drink plenty of liquids.  You will need to drink even more glasses of water/liquid a day to avoid getting dehydrated. °Record output from your ileostomy.  Expect to empty the bag every 3-4 hours at first.  Most people with a permanent ileostomy empty their bag 4-6 times at the least.   °Use antidiarrheal medicine (especially Imodium) several times a day to avoid getting dehydrated.  Start with a dose at bedtime & breakfast.  Adjust up or down as needed.  Increase antidiarrheal medications as directed to avoid emptying the bag more than 8 times a day (every 3 hours). °Work with your wound ostomy nurse to learn care for your ostomy.  See ostomy care instructions. °TROUBLESHOOTING IRREGULAR BOWELS °1) Start with a soft & bland diet. No spicy, greasy, or fried foods.  °2) Avoid gluten/wheat or dairy products from diet to see if symptoms improve. °3) Miralax  17gm or flax seed mixed in 8oz. water or juice-daily. May use 2-4 times a day as needed. °4) Gas-X, Phazyme, etc. as needed for gas & bloating.  °5) Prilosec (omeprazole) over-the-counter as needed °6)  Consider probiotics (Align, Activa, etc) to help calm the bowels down ° °Call your doctor if you are getting worse or not getting better.  Sometimes further testing (cultures, endoscopy, X-ray studies, CT scans, bloodwork, etc.) may be needed to help diagnose and treat the cause of the diarrhea. °[facility] °[address], [address], [place], [place]  [address] °[phone] - Main.    °[phone]  - Toll Free.   [phone] - Fax °[email or web address] ° ° ° °Diverticulosis ° °Diverticulosis is a condition that develops when small pouches (diverticula) form in the wall of the large intestine (colon). The colon is where water is absorbed and stool is formed. The pouches form when the inside layer of the colon pushes through weak spots in the outer layers of the colon. You may have a few pouches or many of them. °What are the causes? °The cause of this condition is not known. °What increases the risk? °The following factors may make you more likely to develop this condition: °· Being older than age 60. Your risk for this condition increases with age. Diverticulosis is rare among people younger than age 30. By age 80, many people have it. °· Eating a low-fiber diet. °· Having frequent constipation. °· Being overweight. °· Not getting enough exercise. °· Smoking. °· Taking over-the-counter pain medicines, like aspirin and ibuprofen. °· Having a family history of diverticulosis. °What are the signs   or symptoms? °In most people, there are no symptoms of this condition. If you do have symptoms, they may include: °· Bloating. °· Cramps in the abdomen. °· Constipation or diarrhea. °· Pain in the lower left side of the abdomen. °How is this diagnosed? °This condition is most often  diagnosed during an exam for other colon problems. Because diverticulosis usually has no symptoms, it often cannot be diagnosed independently. This condition may be diagnosed by: °· Using a flexible scope to examine the colon (colonoscopy). °· Taking an X-ray of the colon after dye has been put into the colon (barium enema). °· Doing a CT scan. °How is this treated? °You may not need treatment for this condition if you have never developed an infection related to diverticulosis. If you have had an infection before, treatment may include: °· Eating a high-fiber diet. This may include eating more fruits, vegetables, and grains. °· Taking a fiber supplement. °· Taking a live bacteria supplement (probiotic). °· Taking medicine to relax your colon. °· Taking antibiotic medicines. °Follow these instructions at home: °· Drink 6-8 glasses of water or more each day to prevent constipation. °· Try not to strain when you have a bowel movement. °· If you have had an infection before: °? Eat more fiber as directed by your health care provider or your diet and nutrition specialist (dietitian). °? Take a fiber supplement or probiotic, if your health care provider approves. °· Take over-the-counter and prescription medicines only as told by your health care provider. °· If you were prescribed an antibiotic, take it as told by your health care provider. Do not stop taking the antibiotic even if you start to feel better. °· Keep all follow-up visits as told by your health care provider. This is important. °Contact a health care provider if: °· You have pain in your abdomen. °· You have bloating. °· You have cramps. °· You have not had a bowel movement in 3 days. °Get help right away if: °· Your pain gets worse. °· Your bloating becomes very bad. °· You have a fever or chills, and your symptoms suddenly get worse. °· You vomit. °· You have bowel movements that are bloody or black. °· You have bleeding from your  rectum. °Summary °· Diverticulosis is a condition that develops when small pouches (diverticula) form in the wall of the large intestine (colon). °· You may have a few pouches or many of them. °· This condition is most often diagnosed during an exam for other colon problems. °· If you have had an infection related to diverticulosis, treatment may include increasing the fiber in your diet, taking supplements, or taking medicines. °This information is not intended to replace advice given to you by your health care provider. Make sure you discuss any questions you have with your health care provider. °Document Released: 03/31/2004 Document Revised: 06/16/2017 Document Reviewed: 05/23/2016 °[employer] Patient Education © 2020 [employer] ° °

## 2019-01-30 NOTE — Interval H&P Note (Signed)
History and Physical Interval Note:  01/30/2019 10:22 AM  Christina Miller  has presented today for surgery, with the diagnosis of [place].  The various methods of treatment have been discussed with the patient and family. After consideration of risks, benefits and other options for treatment, the patient has consented to  Procedure(s): XI ROBOT ASSISTED RESECTION OF SIGMOID COLON (N/A) RIGID PROCTOSCOPY (N/A) as a surgical intervention.  The patient's history has been reviewed, patient examined, no change in status, stable for surgery.  I have reviewed the patient's chart and labs.  Questions were answered to the patient's satisfaction.    I have re-reviewed the the patient's records, history, medications, and allergies.  I have re-examined the patient.  I again discussed intraoperative plans and goals of post-operative recovery.  The patient agrees to proceed.  Christina Miller  1966-02-15 [id number]  Patient Care Team: Ladell Pier, MD as PCP - General (Internal Medicine) [name], MD as Consulting Physician (General Surgery) [name], MD as Consulting Physician (Gastroenterology)  Patient Active Problem List   Diagnosis Date Noted  . 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 Financial difficulties 06/26/2018    Priority: Medium  . Anxiety state 01/30/2019  . Tubular adenoma of colon 07/02/2018  . 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 Tobacco dependence 03/26/2018  . Essential hypertension 03/26/2018  . Multiple sclerosis ([place]) 03/26/2018  . Anemia 03/26/2018  . Pelvic abscess in female   . Intra-abdominal abscess ([place]) 03/09/2018  . COPD (chronic obstructive pulmonary disease) ([place]) 03/09/2018  . Sepsis due to Escherichia coli (E. coli) ([place]) 03/09/2018  . Diverticulitis of large intestine with perforation and abscess 02/24/2018  . Protrusion of lumbar  intervertebral disc 04/21/2016  . Breast lump on left side at 7 o'clock position 06/22/2012  . Tobacco abuse 06/02/2011  . Perimenopause 06/02/2011  . Hematuria, microscopic 06/02/2011    Past Medical History:  Diagnosis Date  . Abnormal Pap smear    cryo  . Allergy   . Anemia   . Bone fracture    ankle  . Cancer [facility]) 1987   cervical Cancer  . COPD (chronic obstructive pulmonary disease) ([place])   . Emphysema   . Emphysema of lung ([place])   . GERD (gastroesophageal reflux disease)   . Headache(784.0)   . Hypertension   . Infection    urinary tract infection  . MS (multiple sclerosis) ([place])   . Neuromuscular disorder ([place])    hands and feet face and lip and muscle weakness due to MS  . Pelvic abscess in female     Past Surgical History:  Procedure Laterality Date  . ARM WOUND REPAIR / CLOSURE    . BREAST BIOPSY Left 07/03/2012  . COLPOSCOPY    . DILATION AND CURETTAGE OF UTERUS    . IR CATHETER TUBE CHANGE  04/04/2018  . IR CATHETER TUBE CHANGE  05/16/2018  . IR CATHETER TUBE CHANGE  07/05/2018  . IR CATHETER TUBE CHANGE  09/21/2018  . IR RADIOLOGIST EVAL & MGMT  03/29/2018  . IR RADIOLOGIST EVAL & MGMT  04/12/2018  . JP DRAIN TUBE    . TUBAL LIGATION      Social History   Socioeconomic History  . Marital status: Legally Separated    Spouse name: Not on file  . Number of children: Not on file  . Years of education: Not on file  . Highest education  level: Not on file  Occupational History  . Not on file  Social Needs  . Financial resource strain: Not on file  . Food insecurity    Worry: Not on file    Inability: Not on file  . Transportation needs    Medical: Not on file    Non-medical: Not on file  Tobacco Use  . Smoking status: Current Every Day Smoker    Packs/day: 1.00    Years: 15.00    Pack years: 15.00    Types: Cigarettes  . Smokeless tobacco: Former Network engineer and Sexual Activity  . Alcohol use: Yes    Comment: occ  . Drug use: No  .  Sexual activity: Yes    Birth control/protection: Surgical  Lifestyle  . Physical activity    Days per week: Not on file    Minutes per session: Not on file  . Stress: Not on file  Relationships  . Social Herbalist on phone: Not on file    Gets together: Not on file    Attends religious service: Not on file    Active member of club or organization: Not on file    Attends meetings of clubs or organizations: Not on file    Relationship status: Not on file  . Intimate partner violence    Fear of current or ex partner: Not on file    Emotionally abused: Not on file    Physically abused: Not on file    Forced sexual activity: Not on file  Other Topics Concern  . Not on file  Social History Narrative   ** Merged History Encounter **        Family History  Problem Relation Age of Onset  . Diabetes Father   . Diabetes Sister   . Hypertension Sister   . Diabetes Brother   . Hypertension Brother   . Lung cancer Mother   . Colon cancer Neg Hx   . Stomach cancer Neg Hx   . Pancreatic cancer Neg Hx   . Esophageal cancer Neg Hx   . Rectal cancer Neg Hx     Medications Prior to Admission  Medication Sig Dispense Refill Last Dose  . albuterol (PROVENTIL HFA;VENTOLIN HFA) 108 (90 Base) MCG/ACT inhaler Inhale 2 puffs into the lungs every 6 (six) hours as needed for wheezing or shortness of breath. (Patient taking differently: Inhale 1 puff into the lungs every 6 (six) hours as needed for wheezing or shortness of breath. ) 1 Inhaler 2   . carvedilol (COREG) 3.125 MG tablet Take 1 tablet (3.125 mg total) by mouth 2 (two) times daily with a meal. 60 tablet 3     Current Facility-Administered Medications  Medication Dose Route Frequency Provider Last Rate Last Dose  . acetaminophen (TYLENOL) tablet 1,000 mg  1,000 mg Oral On Call to OR [name], MD      . alvimopan (ENTEREG) capsule 12 mg  12 mg Oral On Call to OR [name], MD      . bupivacaine liposome (EXPAREL)  1.3 % injection 266 mg  20 mL Infiltration Once [name], MD      . cefoTEtan (CEFOTAN) 2 g in sodium chloride 0.9 % 100 mL IVPB  2 g Intravenous On Call to OR [name], MD      . celecoxib (CELEBREX) capsule 200 mg  200 mg Oral On Call to OR [name], MD      . clindamycin (CLEOCIN) 900  mg, gentamicin (GARAMYCIN) 240 mg in sodium chloride 0.9 % 1,000 mL for intraperitoneal lavage   Irrigation To OR [name], MD      . enoxaparin (LOVENOX) injection 40 mg  40 mg Subcutaneous Once [name], MD      . gabapentin (NEURONTIN) capsule 300 mg  300 mg Oral On Call to OR [name], MD      . lactated ringers infusion   Intravenous Continuous Myrtie Soman, MD         Allergies  Allergen Reactions  . Hydrocodone Nausea And Vomiting    LMP 06/20/2012   Labs: No results found for this or any previous visit (from the past 101 hour(s)).  Imaging / Studies: No results found.   Adin Hector, M.D., F.A.C.S. Gastrointestinal and Minimally Invasive Surgery [facility] [address] [place], [place] [id number] [phone] Main / Paging  01/30/2019 10:22 AM     Adin Hector

## 2019-01-31 ENCOUNTER — Encounter (HOSPITAL_COMMUNITY): Payer: Self-pay | Admitting: Surgery

## 2019-01-31 LAB — CBC
HCT: 32.8 % — ABNORMAL LOW (ref 36.0–46.0)
Hemoglobin: 10.6 g/dL — ABNORMAL LOW (ref 12.0–15.0)
MCH: 35.1 pg — ABNORMAL HIGH (ref 26.0–34.0)
MCHC: 32.3 g/dL (ref 30.0–36.0)
MCV: 108.6 fL — ABNORMAL HIGH (ref 80.0–100.0)
Platelets: 194 10*3/uL (ref 150–400)
RBC: 3.02 MIL/uL — ABNORMAL LOW (ref 3.87–5.11)
RDW: 12.8 % (ref 11.5–15.5)
WBC: 11.5 10*3/uL — ABNORMAL HIGH (ref 4.0–10.5)
nRBC: 0 % (ref 0.0–0.2)

## 2019-01-31 LAB — BASIC METABOLIC PANEL
Anion gap: 7 (ref 5–15)
BUN: 6 mg/dL (ref 6–20)
CO2: 27 mmol/L (ref 22–32)
Calcium: 7.4 mg/dL — ABNORMAL LOW (ref 8.9–10.3)
Chloride: 99 mmol/L (ref 98–111)
Creatinine, Ser: 0.59 mg/dL (ref 0.44–1.00)
GFR calc Af Amer: >60 mL/min (ref >60)
GFR calc non Af Amer: >60 mL/min (ref >60)
Glucose, Bld: 128 mg/dL — ABNORMAL HIGH (ref 70–99)
Potassium: 3.9 mmol/L (ref 3.5–5.1)
Sodium: 133 mmol/L — ABNORMAL LOW (ref 135–145)

## 2019-01-31 LAB — MAGNESIUM: Magnesium: 1.2 mg/dL — ABNORMAL LOW (ref 1.7–2.4)

## 2019-01-31 MED ORDER — LORAZEPAM 2 MG/ML IJ SOLN
0.5000 mg | Freq: Three times a day (TID) | INTRAMUSCULAR | Status: DC | PRN
  Administered 2019-01-31 – 2019-02-01 (×2): 0.5 mg via INTRAVENOUS
  Administered 2019-02-02: 1 mg via INTRAVENOUS
  Filled 2019-01-31 (×3): qty 1

## 2019-01-31 MED ORDER — MAGNESIUM SULFATE 4 GM/100ML IV SOLN
4.0000 g | Freq: Once | INTRAVENOUS | Status: AC
  Administered 2019-01-31: 09:00:00 4 g via INTRAVENOUS
  Filled 2019-01-31: qty 100

## 2019-01-31 NOTE — Progress Notes (Signed)
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 Active Problems:   Enteroenteric fistula   COPD (chronic obstructive pulmonary disease) ([place])   Multiple sclerosis ([place])   Anxiety state   Diverticulitis of intestine with perforation and abscess   1 Day Post-Op  01/30/2019  Procedure(s): XI ROBOT ASSISTED LOW ANTERIOR RESECTION, MOBILIZATION OF SPLENIC FLEXURE, LEFT SALPINGO OOPHORECTOMY, BILATERAL TAP BLOCK RIGID PROCTOSCOPY    Assessment  Recovering  [facility] Stay = 1 days)  Plan:  -ERAS protocol  -Follow-up on pathology -Would like IV fluids with her COPD/reactive airway disease -Mobilize as tolerated -VTE prophylaxis- SCDs, etc -mobilize as tolerated to help recovery -Remove Foley.  INO cath PRN -Remove dressings on postoperative day 3 = Saturday 7/18.  Remove all wicks in main incision as well as own drain site.  Allow old drain incision to heal by secondary intention.   -STOP SMOKING! We talked to the patient about the dangers of smoking.  We stressed that tobacco use dramatically increases the risk of peri-operative complications such as infection, tissue necrosis leaving to problems with incision/wound and organ healing, hernia, chronic pain, heart attack, stroke, DVT, pulmonary embolism, and death.  We noted there are programs in our community to help stop smoking.  Information was available.  D/C patient from hospital when patient meets  criteria (anticipate in 1-3 day(s)):  Tolerating oral intake well Ambulating well Adequate pain control without IV medications Urinating  Having flatus Disposition planning in place   I updated the patient's status to the patient.  Recommendations were made.  Questions were answered.  She expressed understanding & appreciation.   25 minutes spent in review, evaluation, examination, counseling, and coordination of care.  More than 50% of that time was spent in counseling.  01/31/2019    Subjective: (Chief complaint)  Walked in hallways Worried about blood on dressings and blood in drain.  Not lightheaded or dizzy.  Tolerating liquids.  A little bit of fullness but no nausea.  Pain controlled.  Appreciative of care.  Objective:  Vital signs:  Vitals:   01/30/19 2026 01/30/19 2207 01/31/19 0034 01/31/19 0443  BP: 97/72 103/79 101/75 101/71  Pulse: 78 75 79 80  Resp: 16 16 16 18   Temp: 98 F (36.7 C)  (!) 97.5 F (36.4 C) 97.7 F (36.5 C)  TempSrc: Oral Oral Oral Oral  SpO2: 97%  100% 99%  Weight:      Height:        Last BM Date: 01/30/19  Intake/Output   Yesterday:  07/15 0701 - 07/16 0700 In: 0354 [P.O.:480; I.V.:2891.7; IV Piggyback:9.3] Out: 1169 [Urine:550; Drains:468; Stool:1; Blood:150] This shift:  No intake/output data recorded.  Bowel function:  Flatus: No  BM:  No  Drain: Serosanguinous   Physical Exam:  General: Pt awake/alert/oriented x4 in no acute distress Eyes: PERRL, normal EOM.  Sclera clear.  No icterus Neuro: CN II-XII intact w/o focal sensory/motor deficits. Lymph: No head/neck/groin lymphadenopathy Psych:  No delerium/psychosis/paranoia HENT: Normocephalic, Mucus membranes moist.  No thrush Neck: Supple, No tracheal  deviation Chest: No chest wall pain w good excursion CV:  Pulses intact.  Regular rhythm MS: Normal AROM mjr joints.  No obvious deformity  Abdomen: Soft.  Mildy distended.  Mildly tender at incisions only.   No evidence of peritonitis.  No incarcerated hernias.  Ext:  No deformity.  No mjr edema.  No cyanosis Skin: No petechiae / purpura  Results:   Cultures: Recent Results (from the past 720 hour(s))  SARS Coronavirus 2 (Performed in [place] hospital lab)     Status: None   Collection Time: 01/26/19  4:58 PM   Specimen: Nasal Swab  Result Value Ref Range Status   SARS Coronavirus 2 NEGATIVE NEGATIVE Final    Comment: (NOTE) SARS-CoV-2 target nucleic acids are NOT DETECTED. The SARS-CoV-2 RNA is generally detectable in upper and lower respiratory specimens during the acute phase of infection. Negative results do not preclude SARS-CoV-2 infection, do not rule out co-infections with other pathogens, and should not be used as the sole basis for treatment or other patient management decisions. Negative results must be combined with clinical observations, patient history, and epidemiological information. The expected result is Negative. Fact Sheet for Patients: SugarRoll.be Fact Sheet for Healthcare Providers: https://www.woods-mathews.com/ This test is not yet approved or cleared by the [place] FDA and  has been authorized for detection and/or diagnosis of SARS-CoV-2 by FDA under an Emergency Use Authorization (EUA). This EUA will remain  in effect (meaning this test can be used) for the duration of the COVID-19 declaration under Section 56 4(b)(1) of the Act, 21 U.S.C. section 360bbb-3(b)(1), unless the authorization is terminated or revoked sooner. Performed at [facility], [place] [address], [place], [place] [address]     Labs: Results for orders placed or performed during the hospital encounter of 01/30/19 (from the past 48 hour(s))  Basic metabolic panel     Status: Abnormal   Collection Time: 01/31/19  3:26 AM  Result Value Ref Range   Sodium 133 (L) 135 - 145 mmol/L   Potassium 3.9 3.5 - 5.1 mmol/L   Chloride 99 98 -  111 mmol/L   CO2 27 22 - 32 mmol/L   Glucose, Bld 128 (H) 70 - 99 mg/dL   BUN 6 6 - 20 mg/dL   Creatinine, Ser 0.59 0.44 - 1.00 mg/dL   Calcium 7.4 (L) 8.9 - 10.3 mg/dL   GFR calc non Af Amer >60 >60 mL/min   GFR calc Af Amer >60 >60 mL/min   Anion gap 7 5 - 15    Comment: Performed at [facility], [place] [address], [place], [place] [address]  CBC     Status: Abnormal   Collection Time: 01/31/19  3:26 AM  Result Value Ref Range   WBC 11.5 (H) 4.0 - 10.5 K/uL   RBC 3.02 (L) 3.87 - 5.11 MIL/uL   Hemoglobin 10.6 (L) 12.0 - 15.0 g/dL   HCT 32.8 (L) 36.0 - 46.0 %   MCV 108.6 (H) 80.0 - 100.0 fL   MCH 35.1 (H) 26.0 - 34.0 pg   MCHC 32.3 30.0 - 36.0 g/dL   RDW 12.8 11.5 - 15.5 %   Platelets 194 150 - 400 K/uL   nRBC 0.0 0.0 - 0.2 %    Comment: Performed at [facility], [place] [address] [place], [place] [address]  Magnesium     Status: Abnormal   Collection Time: 01/31/19  3:26 AM  Result Value Ref Range   Magnesium  1.2 (L) 1.7 - 2.4 mg/dL    Comment: Performed at [facility], [place] [address], [place], [place] [address]    Imaging / Studies: No results found.  Medications / Allergies: per chart  Antibiotics: Anti-infectives (From admission, onward)   Start     Dose/Rate Route Frequency Ordered Stop   01/30/19 2200  cefoTEtan (CEFOTAN) 2 g in sodium chloride 0.9 % 100 mL IVPB     2 g 200 mL/hr over 30 Minutes Intravenous Every 12 hours 01/30/19 1658 01/30/19 2227   01/30/19 1455  clindamycin (CLEOCIN) 900 mg, gentamicin (GARAMYCIN) 240 mg in sodium chloride 0.9 % 1,000 mL for intraperitoneal lavage  Status:  Discontinued       As needed 01/30/19 1456 01/30/19 1540   01/30/19 1400  neomycin (MYCIFRADIN) tablet 1,000 mg  Status:  Discontinued     1,000 mg Oral 3 times per day 01/30/19 1018 01/30/19 1021   01/30/19 1400  metroNIDAZOLE (FLAGYL) tablet 1,000 mg  Status:  Discontinued     1,000 mg Oral 3 times per day 01/30/19  1018 01/30/19 1021   01/30/19 1030  cefoTEtan (CEFOTAN) 2 g in sodium chloride 0.9 % 100 mL IVPB     2 g 200 mL/hr over 30 Minutes Intravenous On call to O.R. 01/30/19 1018 01/30/19 1231   01/30/19 0600  clindamycin (CLEOCIN) 900 mg, gentamicin (GARAMYCIN) 240 mg in sodium chloride 0.9 % 1,000 mL for intraperitoneal lavage  Status:  Discontinued      Irrigation To Surgery 01/29/19 0734 01/30/19 1652        Note: Portions of this report may have been transcribed using voice recognition software. Every effort was made to ensure accuracy; however, inadvertent computerized transcription errors may be present.   Any transcriptional errors that result from this process are unintentional.     Adin Hector, MD, FACS, MASCRS Gastrointestinal and Minimally Invasive Surgery    [address] [place], [place] [address] [phone] Main / Paging [phone] Fax

## 2019-01-31 NOTE — Progress Notes (Signed)
Pharmacy Brief Note - Alvimopan (Entereg)  The standing order set for alvimopan (Entereg) now includes an automatic order to discontinue the drug after the patient has had a bowel movement. The change was approved by the [facility] and the Medical Executive Committee.   This patient has had bowel movements documented by nursing. Therefore, alvimopan has been discontinued. If there are questions, please contact the pharmacy at [phone].   Thank you-  Dolly Rias RPh 01/31/2019, 9:11 AM

## 2019-01-31 NOTE — TOC Initial Note (Signed)
Transition of Care [facility]) - Initial/Assessment Note    Patient Details  Name: Christina Miller MRN: [id number] Date of Birth: Nov 14, 1965  [facility] of [facility]) CM/SW Contact:    Shade Flood, LCSW Phone Number: 01/31/2019, 1:40 PM  Clinical Narrative:                  Received TOC consult stating pt with history of homelessness and financial hardship. Met with pt today to assess. Pt informs LCSW that she lives with her boyfriend and she plans to return there at dc. Pt states she recently received Medicaid and she is planning to go to social security and apply for disability. Pt reports that she is independent in ADLs at home and she does not use any DME. Pt is established with a PCP and she does not have difficulty obtaining medications now. Per pt, her boyfriend drives her when she needs to go somewhere but pt states she hasn't been going anywhere other than appointments due to the virus.   Pt does not seem to have any unmet needs at this time. She reports that she feels connected to community resources that she needs. Pt is not anticipating TOC needs for dc.  TOC will be available if needed.  Expected Discharge Plan: Home/Self Care Barriers to Discharge: Continued Medical Work up   Patient Goals and CMS Choice        Expected Discharge Plan and Services Expected Discharge Plan: Home/Self Care In-house Referral: Clinical Social Work     Living arrangements for the past 2 months: Single Family Home                                      Prior Living Arrangements/Services Living arrangements for the past 2 months: Single Family Home Lives with:: Significant Other Patient language and need for interpreter reviewed:: Yes        Need for Family Participation in Patient Care: No (Comment) Care giver support system in place?: Yes (comment)   Criminal Activity/Legal Involvement Pertinent to Current Situation/Hospitalization: No - Comment as needed  Activities  of Daily Living Home Assistive Devices/Equipment: None ADL Screening (condition at time of admission) Patient's cognitive ability adequate to safely complete daily activities?: Yes Is the patient deaf or have difficulty hearing?: No Does the patient have difficulty seeing, even when wearing glasses/contacts?: No Does the patient have difficulty concentrating, remembering, or making decisions?: No Patient able to express need for assistance with ADLs?: Yes Does the patient have difficulty dressing or bathing?: No Independently performs ADLs?: Yes (appropriate for developmental age) Does the patient have difficulty walking or climbing stairs?: No Weakness of Legs: None Weakness of Arms/Hands: None  Permission Sought/Granted                  Emotional Assessment Appearance:: Appears younger than stated age Attitude/Demeanor/Rapport: Engaged Affect (typically observed): Pleasant Orientation: : Oriented to Self, Oriented to Place, Oriented to  Time, Oriented to Situation Alcohol / Substance Use: Not Applicable Psych Involvement: No (comment)  Admission diagnosis:  PELVIC ABSCESS AND FISTULA FROM PERFORATED DIVERTICULITIS Patient Active Problem List   Diagnosis Date Noted  . Anxiety state 01/30/2019  . Tubular adenoma of colon 07/02/2018  . Financial difficulties 06/26/2018  . Tobacco dependence 03/26/2018  . Essential hypertension 03/26/2018  . Multiple sclerosis ([place]) 03/26/2018  . Anemia 03/26/2018  . 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 Intra-abdominal abscess ([place])  03/09/2018  . COPD (chronic obstructive pulmonary disease) ([place]) 03/09/2018  . Diverticulitis of large intestine with perforation and abscess 02/24/2018  . Protrusion of lumbar intervertebral disc 04/21/2016  . Breast lump on left side at 7 o'clock position 06/22/2012  . Perimenopause 06/02/2011  . Hematuria, microscopic 06/02/2011   PCP:  Ladell Pier, MD Pharmacy:   [facility], [place] [address] Phone: [phone] Fax: [phone]     Social Determinants of Health (SDOH) Interventions    Readmission Risk Interventions Readmission Risk Prevention Plan 01/31/2019  Transportation Screening Complete  Medication Review (RN CM) Complete  Some recent data might be hidden

## 2019-02-01 NOTE — Progress Notes (Addendum)
2 Days Post-Op    CC: Perforated diverticulitis with entero-entero-fistula  Subjective: Patient is anxious and having fair amount of discomfort this a.m.  She seems to be tolerating soft diet well.  Objective: Vital signs in last 24 hours: Temp:  [97.5 F (36.4 C)-100.3 F (37.9 C)] 100.3 F (37.9 C) (07/17 0612) Pulse Rate:  [83-100] 83 (07/17 0612) Resp:  [16-18] 18 (07/17 0612) BP: (91-108)/(58-76) 107/71 (07/17 0612) SpO2:  [92 %-98 %] 92 % (07/17 0612) Weight:  [52.3 kg] 52.3 kg (07/17 0630) Last BM Date: 02/01/19 Pain control: Tylenol 1 g every 6, Neurontin 300 mg twice daily, tramadol,  Ativan x1  For 1920 p.o. 300 IV Urine 1850 Drain 360 Stool x3 T-max 100.3 vital signs stable Labs yesterday shows white count still elevated at 11.5.  Magnesium was 1.2 yesterday No labs today.  Intake/Output from previous day: 07/16 0701 - 07/17 0700 In: 2182 [P.O.:1920; IV Piggyback:262] Out: 2210 [Urine:1850; Drains:360] Intake/Output this shift: No intake/output data recorded.  General appearance: alert, cooperative, no distress and Anxious and complaining about abdominal dressings being wet and uncomfortable. Resp: Some rales.  Working on Chiropodist. GI: Soft positive bowel sounds.  The dressing was pretty much soaked, and I took the Tegaderm off and clean all the sites.  She had some kind of reaction to the adhesive or the drainage.  Her skin is red and irritated, and the honeycomb pattern where the adhesive was.  I removed the wicking clean the sites with soap and water and redressed.  The JP drainage is serous.  Lab Results:  Recent Labs    01/31/19 0326  WBC 11.5*  HGB 10.6*  HCT 32.8*  PLT 194    BMET Recent Labs    01/31/19 0326  NA 133*  K 3.9  CL 99  CO2 27  GLUCOSE 128*  BUN 6  CREATININE 0.59  CALCIUM 7.4*   PT/INR No results for input(s): LABPROT, INR in the last 72 hours.  No results for input(s): AST, ALT, ALKPHOS, BILITOT, PROT,  ALBUMIN in the last 168 hours.   Lipase     Component Value Date/Time   LIPASE 26 03/09/2018 1315     Medications: . acetaminophen  1,000 mg Oral Q6H  . carvedilol  3.125 mg Oral BID WC  . enoxaparin (LOVENOX) injection  40 mg Subcutaneous Q24H  . feeding supplement  237 mL Oral BID BM  . gabapentin  300 mg Oral BID  . lip balm  1 application Topical BID  . nicotine  14 mg Transdermal Daily  . saccharomyces boulardii  250 mg Oral BID  . sodium chloride flush  3 mL Intravenous Q12H   . sodium chloride    . sodium chloride      Assessment/Plan Hx of IR drain placement 03/11/2018 for perforated diverticulitis Negative inflammatory bowel work-up Hx Multiple sclerosis - reported Tobacco use/COPD Previously homeless -lives with boyfriend  Pelvic abscess and fistula from perforated diverticulitis, entero-entero-fistula Ex RI robot-assisted low anterior resection, drainage of abscess of anterior pelvis and abdominal wall, mobilization of splenic flexure of the colon, left salpingo-oophorectomy, firefly immunofluorescence, bilateral tap block, rigid proctoscopy, 01/30/2019 - Dr. [name] - POD #2   FEN: Soft diet no IV fluids listed ID: Cefotetan preop DVT: Lovenox Follow-up Dr. [name]  Plan: I have taken down the dressing and changed and put a dry dressing and paper tape over the site.  It is not overly the red erythematous area.    She  is doing fairly well and now lives with her boyfriend in a house.  She is tolerating the diet having loose stools.    Hopefully home in the a.m.  We will need to decide on pulling the drain or sending home with the drain.    LOS: 2 days    JENNINGS,WILLARD 02/01/2019 [phone]  Agree with above. Having BM's.  Just sore. If doing well, can probably pull Blake drain tomorrow.  Alphonsa Overall, MD, [facility] Surgery Pager: [phone] Office phone:  [phone]

## 2019-02-02 MED ORDER — TRAMADOL HCL 50 MG PO TABS: 50.0000 mg | ORAL_TABLET | Freq: Four times a day (QID) | ORAL | Status: DC | PRN

## 2019-02-02 MED ORDER — OXYCODONE HCL 5 MG PO TABS
5.0000 mg | ORAL_TABLET | ORAL | Status: DC | PRN
  Administered 2019-02-02 – 2019-02-03 (×4): 5 mg via ORAL
  Filled 2019-02-02 (×4): qty 1

## 2019-02-02 NOTE — Plan of Care (Signed)
Patient sitting up in chair this morning. Pain controlled but has slight nausea. No other concerns noted. Will continue to monitor.

## 2019-02-02 NOTE — Progress Notes (Signed)
3 Days Post-Op robotic sigmoidectomy   CC: Perforated diverticulitis with entero-entero-fistula  Subjective: Patient is anxious and having fair amount of discomfort this a.m.  She seems to be tolerating soft diet well.  She does complain of bloating.  Objective: Vital signs in last 24 hours: Temp:  [98.1 F (36.7 C)-98.8 F (37.1 C)] 98.1 F (36.7 C) (07/18 0620) Pulse Rate:  [95] 95 (07/18 0620) Resp:  [16-18] 16 (07/18 0620) BP: (110-120)/(82-99) 110/84 (07/18 0620) SpO2:  [94 %-100 %] 94 % (07/18 0620) Weight:  [52.2 kg] 52.2 kg (07/18 0007) Last BM Date: 02/01/19   Intake/Output from previous day: 07/17 0701 - 07/18 0700 In: 2163 [P.O.:2160; I.V.:3] Out: 1890 [Urine:1700; Drains:190] Intake/Output this shift: Total I/O In: 120 [P.O.:120] Out: -   General appearance: alert, cooperative, no distress and Anxious  Resp:no distress GI: Soft    The JP drainage is serous.  Lab Results:  Recent Labs    01/31/19 0326  WBC 11.5*  HGB 10.6*  HCT 32.8*  PLT 194    BMET Recent Labs    01/31/19 0326  NA 133*  K 3.9  CL 99  CO2 27  GLUCOSE 128*  BUN 6  CREATININE 0.59  CALCIUM 7.4*   PT/INR No results for input(s): LABPROT, INR in the last 72 hours.  No results for input(s): AST, ALT, ALKPHOS, BILITOT, PROT, ALBUMIN in the last 168 hours.   Lipase     Component Value Date/Time   LIPASE 26 03/09/2018 1315     Medications: . acetaminophen  1,000 mg Oral Q6H  . carvedilol  3.125 mg Oral BID WC  . enoxaparin (LOVENOX) injection  40 mg Subcutaneous Q24H  . feeding supplement  237 mL Oral BID BM  . gabapentin  300 mg Oral BID  . lip balm  1 application Topical BID  . nicotine  14 mg Transdermal Daily  . saccharomyces boulardii  250 mg Oral BID  . sodium chloride flush  3 mL Intravenous Q12H   . 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 [name] - POD #3   FEN: Soft diet no IV fluids listed ID: Cefotetan preop DVT: Lovenox Follow-up Dr. [name]  Plan: I have taken down the dressing and changed and put a dry dressing and paper tape over the site.  It is not overly the red erythematous area.    She is doing fairly well and now lives with her boyfriend in a house.  She is tolerating the diet having loose stools.    She doesn't feel that she can control her pain at home    LOS: 3 days    [name] 8/56/3149

## 2019-02-03 LAB — CBC
HCT: 33.2 % — ABNORMAL LOW (ref 36.0–46.0)
Hemoglobin: 10.8 g/dL — ABNORMAL LOW (ref 12.0–15.0)
MCH: 35.5 pg — ABNORMAL HIGH (ref 26.0–34.0)
MCHC: 32.5 g/dL (ref 30.0–36.0)
MCV: 109.2 fL — ABNORMAL HIGH (ref 80.0–100.0)
Platelets: 284 10*3/uL (ref 150–400)
RBC: 3.04 MIL/uL — ABNORMAL LOW (ref 3.87–5.11)
RDW: 12.9 % (ref 11.5–15.5)
WBC: 15.5 10*3/uL — ABNORMAL HIGH (ref 4.0–10.5)
nRBC: 0 % (ref 0.0–0.2)

## 2019-02-03 LAB — BASIC METABOLIC PANEL
Anion gap: 14 (ref 5–15)
BUN: 8 mg/dL (ref 6–20)
CO2: 28 mmol/L (ref 22–32)
Calcium: 8.5 mg/dL — ABNORMAL LOW (ref 8.9–10.3)
Chloride: 92 mmol/L — ABNORMAL LOW (ref 98–111)
Creatinine, Ser: 0.49 mg/dL (ref 0.44–1.00)
GFR calc Af Amer: >60 mL/min (ref >60)
GFR calc non Af Amer: >60 mL/min (ref >60)
Glucose, Bld: 102 mg/dL — ABNORMAL HIGH (ref 70–99)
Potassium: 3.7 mmol/L (ref 3.5–5.1)
Sodium: 134 mmol/L — ABNORMAL LOW (ref 135–145)

## 2019-02-03 MED ORDER — METHOCARBAMOL 500 MG PO TABS
500.0000 mg | ORAL_TABLET | Freq: Three times a day (TID) | ORAL | Status: DC
  Administered 2019-02-03 (×3): 500 mg via ORAL
  Filled 2019-02-03 (×3): qty 1

## 2019-02-03 NOTE — Progress Notes (Addendum)
4 Days Post-Op robotic sigmoidectomy   CC: Perforated diverticulitis with entero-entero-fistula  Subjective: Patient complains of sig pain yesterday.  She seems to be tolerating soft diet although it is unclear how much she is eating.  She does complain of bloating.  States the stronger pain meds helps some.  Ambulating in the hall  Objective: Vital signs in last 24 hours: Temp:  [98.1 F (36.7 C)-99.4 F (37.4 C)] 99.4 F (37.4 C) (07/19 0510) Pulse Rate:  [79-100] 100 (07/19 0510) Resp:  [16-18] 16 (07/19 0510) BP: (79-102)/(66-87) 101/70 (07/19 0510) SpO2:  [85 %-99 %] 85 % (07/19 0510) Last BM Date: 02/02/19   Intake/Output from previous day: 07/18 0701 - 07/19 0700 In: 1440 [P.O.:1440] Out: 1860 [Urine:1750; Drains:110] Intake/Output this shift: No intake/output data recorded.  General appearance: alert, cooperative, no distress and Anxious  Resp:no distress GI: Soft.  Moderately distended   The JP drainage is serous.  Lab Results:  No results for input(s): WBC, HGB, HCT, PLT in the last 72 hours.  BMET No results for input(s): NA, K, CL, CO2, GLUCOSE, BUN, CREATININE, CALCIUM in the last 72 hours. PT/INR No results for input(s): LABPROT, INR in the last 72 hours.  No results for input(s): AST, ALT, ALKPHOS, BILITOT, PROT, ALBUMIN in the last 168 hours.   Lipase     Component Value Date/Time   LIPASE 26 03/09/2018 1315     Medications: . acetaminophen  1,000 mg Oral Q6H  . carvedilol  3.125 mg Oral BID WC  . enoxaparin (LOVENOX) injection  40 mg Subcutaneous Q24H  . feeding supplement  237 mL Oral BID BM  . gabapentin  300 mg Oral BID  . lip balm  1 application Topical BID  . nicotine  14 mg Transdermal Daily  . saccharomyces boulardii  250 mg Oral BID  . sodium chloride flush  3 mL Intravenous Q12H   . 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 [name] - POD #3   FEN: Soft diet no IV fluids listed ID: Cefotetan preop DVT: Lovenox Follow-up Dr. [name]  Plan:  I will recheck her labs today Cont soft diet  She is doing fairly well.  She is tolerating the diet having loose stools.    She doesn't feel that she is ready to go home    LOS: 4 days    [name] 01/01/736

## 2019-02-03 NOTE — Plan of Care (Signed)
Patient lying in bed this morning; states slept well overnight. Pain and nausea controlled. Plans to ambulate around unit later this morning. Will continue to monitor.

## 2019-02-04 ENCOUNTER — Encounter (HOSPITAL_COMMUNITY): Payer: Self-pay | Admitting: Surgery

## 2019-02-04 LAB — CBC
HCT: 30.1 % — ABNORMAL LOW (ref 36.0–46.0)
Hemoglobin: 9.4 g/dL — ABNORMAL LOW (ref 12.0–15.0)
MCH: 33.9 pg (ref 26.0–34.0)
MCHC: 31.2 g/dL (ref 30.0–36.0)
MCV: 108.7 fL — ABNORMAL HIGH (ref 80.0–100.0)
Platelets: 301 10*3/uL (ref 150–400)
RBC: 2.77 MIL/uL — ABNORMAL LOW (ref 3.87–5.11)
RDW: 13.1 % (ref 11.5–15.5)
WBC: 10.1 10*3/uL (ref 4.0–10.5)
nRBC: 0 % (ref 0.0–0.2)

## 2019-02-04 MED ORDER — TRAMADOL HCL 50 MG PO TABS
100.0000 mg | ORAL_TABLET | Freq: Four times a day (QID) | ORAL | Status: DC | PRN
  Administered 2019-02-04: 10:00:00 100 mg via ORAL
  Filled 2019-02-04: qty 2

## 2019-02-04 MED ORDER — GABAPENTIN 400 MG PO CAPS
400.0000 mg | ORAL_CAPSULE | Freq: Three times a day (TID) | ORAL | Status: DC
  Administered 2019-02-04: 10:00:00 400 mg via ORAL
  Filled 2019-02-04: qty 1

## 2019-02-04 MED ORDER — GABAPENTIN 300 MG PO CAPS: 300.0000 mg | ORAL_CAPSULE | Freq: Three times a day (TID) | ORAL | 1 refills | Status: DC

## 2019-02-04 MED ORDER — METHOCARBAMOL 500 MG PO TABS
750.0000 mg | ORAL_TABLET | Freq: Four times a day (QID) | ORAL | Status: DC | PRN
  Administered 2019-02-04: 10:00:00 750 mg via ORAL
  Filled 2019-02-04: qty 2

## 2019-02-04 MED FILL — GABAPENTIN 300 MG CAPSULE: 300 | 15 days supply | Qty: 60 | Fill #0

## 2019-02-04 NOTE — Progress Notes (Signed)
Pt alert and oriented, tolerating diet. D/C instructions were given, all questions answered. Pt will be d/cd home.

## 2019-02-04 NOTE — Progress Notes (Signed)
Pt s/p robotic colectomyfor chronic diverticulitis Path benign.  No IBD nor Cancer  Diagnosis 1. Ovary, left - BENIGN OVARY WITH INFLAMED ADHESION AND FIBROSIS. - NO EVIDENCE OF MALIGNANCY. 2. Colon, segmental resection, rectosigmoid - PERFORATED DIVERTICULITIS WITH PERICOLONIC ABSCESS. - THREE BENIGN LYMPH NODES. - NO EVIDENCE OF MALIGNANCY. 3. Colon, resection margin (donut), distal ring - BENIGN COLON. - NO EVIDENCE OF MALIGNANCY. Claudette Laws MD Pathologist, Electronic Signature (Case signed 02/01/2019)    I told the pt the good news

## 2019-02-04 NOTE — Plan of Care (Signed)
Pt met all goals for d/c 

## 2019-02-04 NOTE — Discharge Summary (Signed)
Physician Discharge Summary    Patient ID: Christina Miller MRN: [id number] DOB/AGE: 1965-12-29  53 y.o.  Patient Care Team: Ladell Pier, MD as PCP - General (Internal Medicine) [name], MD as Consulting Physician (General Surgery) [name], MD as Consulting Physician (Gastroenterology)  Admit date: 01/30/2019  Discharge date: 02/04/2019  Hospital Stay = 5 days    Discharge Diagnoses:  Principal Problem:   Diverticulitis of large intestine with perforation and abscess Active Problems:   Financial difficulties   Anxiety state   Enteroenteric fistula   COPD (chronic obstructive pulmonary disease) ([place])   Tobacco dependence   Multiple sclerosis ([place])   5 Days Post-Op  01/30/2019  POST-OPERATIVE DIAGNOSIS:  PELVIC ABSCESS AND FISTULA FROM PERFORATED DIVERTICULITIS  PROCEDURE:  XI ROBOT ASSISTED LOW ANTERIOR RESECTION DRAINAGE OF ABSCESS OF ANTERIOR PELVIS & ABDOMINAL WALL MOBILIZATION OF SPLENIC FLEXURE OF COLON LEFT SALPINGO-OOPHORECTOMY ASSESSMENT OF TISSUE PERFUSION WITH FIRELFY IMMUNOFLUORESENCE BILATERAL TAP BLOCK RIGID PROCTOSCOPY  SURGEON:  Adin Hector, MD   OR FINDINGS:   Elective  PERITONITIS - FOCAL ABSCESS  Focal peritonitis with purulent abscess in abdominal wall and walled off in pelvis.  Patient had severely inflamed horseshoe rectosigmoid plastered to the ovary, salpinx and uterus as well as ureter and gonadal vessels on the left side walling off a chronic abscess around a chronic drain.  No obvious metastatic disease on visceral parietal peritoneum or liver.  The anastomosis rests 9-10 cm from the anal verge by rigid proctoscopy.  Away from abscess and protected with greater omentum  Diagnosis   1. Ovary, left  - BENIGN OVARY WITH INFLAMED ADHESION AND FIBROSIS.  - NO EVIDENCE OF MALIGNANCY.  2. Colon, segmental resection, rectosigmoid  - PERFORATED DIVERTICULITIS WITH PERICOLONIC ABSCESS.  - THREE BENIGN  LYMPH NODES.  - NO EVIDENCE OF MALIGNANCY.  3. Colon, resection margin (donut), distal ring  - BENIGN COLON.  - NO EVIDENCE OF MALIGNANCY.  Claudette Laws MD  Pathologist, Electronic Signature  (Case signed 02/01/2019)   Consults: None  Hospital Course:   The patient underwent the surgery above.  Postoperatively, the patient gradually mobilized and advanced to a solid diet.  Pain and other symptoms were treated aggressively.  Took some time to control this with scheduled Tylenol, gabapentin, Robaxin with PRN tramadol.  She lives with chronic pain.  Eventually improved.  By the time of discharge, the patient was walking well the hallways, eating food, having flatus.  Pain was well-controlled on an oral medications.  Drain was removed at the time of discharge.  Full drain site with clean wound closing down.  Based on meeting discharge criteria and continuing to recover, I felt it was safe for the patient to be discharged from the hospital to further recover with close followup. Postoperative recommendations were discussed in detail.  They are written as well.  Discharged Condition: good  Discharge Exam: Blood pressure 113/87, pulse (!) 107, temperature 99.1 F (37.3 C), temperature source Oral, resp. rate 18, height 5' 2"  (1.575 m), weight 51.3 kg, last menstrual period 06/20/2012, SpO2 99 %.  General: Pt awake/alert/oriented x4 in No acute distress Eyes: PERRL, normal EOM.  Sclera clear.  No icterus Neuro: CN II-XII intact w/o focal sensory/motor deficits. Lymph: No head/neck/groin lymphadenopathy Psych:  No delerium/psychosis/paranoia HENT: Normocephalic, Mucus membranes moist.  No thrush Neck: Supple, No tracheal deviation Chest: No chest wall pain w good excursion CV:  Pulses intact.  Regular rhythm MS: Normal AROM mjr joints.  No obvious deformity  Abdomen: Soft.  Nondistended.  Mildly tender at incisions only.  Incisions closed.  Left lower quadrant drain site 2 x 1 cm granulating  and clean.  Right lower quadrant drain site with serosanguineous fluid in drain.  To be removed this morning prior to be discharged.  No evidence of peritonitis.  No incarcerated hernias. Ext:  SCDs BLE.  No mjr edema.  No cyanosis Skin: No petechiae / purpura   Disposition:   Follow-up Information    [name], MD. Schedule an appointment as soon as possible for a visit in 3 weeks.   Specialty: General Surgery Why: To follow up after your operation Contact information: [address] [place] [id number] [phone]           Discharge disposition: 01-Home or Self Care       Discharge Instructions    Call MD for:   Complete by: As directed    FEVER > 101.5 F  (temperatures < 101.5 F are not significant)   Call MD for:   Complete by: As directed    FEVER > 101.5 F (Temperatures <101.81F can occasionally happen and are not significant)   Call MD for:  extreme fatigue   Complete by: As directed    Call MD for:  extreme fatigue   Complete by: As directed    Call MD for:  persistant dizziness or light-headedness   Complete by: As directed    Call MD for:  persistant dizziness or light-headedness   Complete by: As directed    Call MD for:  persistant nausea and vomiting   Complete by: As directed    Call MD for:  persistant nausea and vomiting   Complete by: As directed    Call MD for:  redness, tenderness, or signs of infection (pain, swelling, redness, odor or green/yellow discharge around incision site)   Complete by: As directed    Call MD for:  redness, tenderness, or signs of infection (pain, swelling, redness, odor or green/yellow discharge around incision site)   Complete by: As directed    Call MD for:  severe uncontrolled pain   Complete by: As directed    Call MD for:  severe uncontrolled pain   Complete by: As directed    Diet - low sodium heart healthy   Complete by: As directed    Start with a bland diet such as soups, liquids,  starchy foods, low fat foods, etc. the first few days at home. Gradually advance to a solid, low-fat, high fiber diet by the end of the first week at home.   Add a fiber supplement to your diet (Metamucil, etc) If you feel full, bloated, or constipated, stay on a full liquid or pureed/blenderized diet for a few days until you feel better and are no longer constipated.   Discharge instructions   Complete by: As directed    See Discharge Instructions If you are not getting better after two weeks or are noticing you are getting worse, contact our office [phone] for further advice.  We may need to adjust your medications, re-evaluate you in the office, send you to the emergency room, or see what other things we can do to help. The clinic staff is available to answer your questions during regular business hours (8:30am-5pm).  Please don't hesitate to call and ask to speak to one of our nurses for clinical concerns.    A surgeon from [facility] is always on call at  the hospitals 24 hours/day If you have a medical emergency, go to the nearest emergency room or call 911.   Discharge wound care:   Complete by: As directed    It is good for closed incisions and even open wounds to be washed every day.  Shower every day.  Short baths are fine.  Wash the incisions and wounds clean with soap & water.    You may leave closed incisions open to air if it is dry.   You may cover the incision with clean gauze & replace it after your daily shower for comfort.   Discharge wound care:   Complete by: As directed    You have an open wound. In general, it is encouraged that you remove your dressing and packing, shower with soap & water, and replace your dressing once a day.    Cober the wound with clean gauze or BandAid    Eventually your body will heal & pull the open wound closed over the next few weeks.  Raw open wounds will occasionally bleed or secrete yellow drainage until it heals closed.    Pressure on the dressing for 30 minutes will stop most wound bleeding Drain sites will drain a little after the drain is removed - it will dry up within a few days   Driving Restrictions   Complete by: As directed    You may drive when: - you are no longer taking narcotic prescription pain medication - you can comfortably wear a seatbelt - you can safely make sudden turns/stops without pain.   Driving Restrictions   Complete by: As directed    You may drive when you are no longer taking narcotic prescription pain medication, you can comfortably wear a seatbelt, and you can safely make sudden turns/stops to protect yourself without hesitating due to pain.   Increase activity slowly   Complete by: As directed    Start light daily activities --- self-care, walking, climbing stairs- beginning the day after surgery.  Gradually increase activities as tolerated.  Control your pain to be active.  Stop when you are tired.  Ideally, walk several times a day, eventually an hour a day.   Most people are back to most day-to-day activities in a few weeks.  It takes 4-6 weeks to get back to unrestricted, intense activity. If you can walk 30 minutes without difficulty, it is safe to try more intense activity such as jogging, treadmill, bicycling, low-impact aerobics, swimming, etc. Save the most intensive and strenuous activity for last (Usually 4-8 weeks after surgery) such as sit-ups, heavy lifting, contact sports, etc.  Refrain from any intense heavy lifting or straining until you are off narcotics for pain control.  You will have off days, but things should improve week-by-week. DO NOT PUSH THROUGH PAIN.  Let pain be your guide: If it hurts to do something, don't do it.   Increase activity slowly   Complete by: As directed    Lifting restrictions   Complete by: As directed    If you can walk 30 minutes without difficulty, it is safe to try more intense activity such as jogging, treadmill, bicycling,  low-impact aerobics, swimming, etc. Save the most intensive and strenuous activity for last (Usually 4-8 weeks after surgery) such as sit-ups, heavy lifting, contact sports, etc.   Refrain from any intense heavy lifting or straining until you are off narcotics for pain control.  You will have off days, but things should improve week-by-week. DO NOT PUSH  THROUGH PAIN.  Let pain be your guide: If it hurts to do something, don't do it.  Pain is your body warning you to avoid that activity for another week until the pain goes down.   Lifting restrictions   Complete by: As directed    Start light daily activities --- self-care, walking, climbing stairs- beginning the day after surgery.   Gradually increase activities as tolerated.   Control your pain to be active.   Stop when you are tired.   Ideally, walk several times a day, eventually an hour a day.   Most people are back to most day-to-day activities in a few weeks.  It takes 4-8 weeks to get back to unrestricted, intense activity. If you can walk 30 minutes without difficulty, it is safe to try more intense activity such as jogging, treadmill, bicycling, low-impact aerobics, swimming, etc. Save the most intensive and strenuous activity for last (Usually 4-8 weeks after surgery) such as sit-ups, heavy lifting, contact sports, etc.   Refrain from any intense heavy lifting or straining until you are off narcotics for pain control.  You will have off days, but things should improve week-by-week. DO NOT PUSH THROUGH PAIN.   Let pain be your guide: If it hurts to do something, don't do it.  Pain is your body warning you to avoid that activity for another week until the pain goes down.   May shower / Bathe   Complete by: As directed    May shower / Bathe   Complete by: As directed    May walk up steps   Complete by: As directed    May walk up steps   Complete by: As directed    Remove dressing in 72 hours   Complete by: As directed    Make sure  all dressings are removed by the third day after surgery.  Leave incisions open to air.  OK to cover incisions with gauze or bandages as desired   Sexual Activity Restrictions   Complete by: As directed    You may have sexual intercourse when it is comfortable. If it hurts to do something, stop.   Sexual Activity Restrictions   Complete by: As directed    You may have sexual intercourse when it is comfortable. If it hurts to do something, stop.      Allergies as of 02/04/2019      Reactions   Hydrocodone Nausea And Vomiting      Medication List    TAKE these medications   albuterol 108 (90 Base) MCG/ACT inhaler Commonly known as: VENTOLIN HFA Inhale 2 puffs into the lungs every 6 (six) hours as needed for wheezing or shortness of breath. What changed: how much to take   carvedilol 3.125 MG tablet Commonly known as: COREG Take 1 tablet (3.125 mg total) by mouth 2 (two) times daily with a meal.   gabapentin 300 MG capsule Commonly known as: NEURONTIN Take 1 capsule (300 mg total) by mouth 3 (three) times daily. Increase to 4x/day as needed   traMADol 50 MG tablet Commonly known as: ULTRAM Take 1-2 tablets (50-100 mg total) by mouth every 6 (six) hours as needed for moderate pain or severe pain.            Discharge Care Instructions  (From admission, onward)         Start     Ordered   02/04/19 0000  Discharge wound care:    Comments: You have an open wound.  In general, it is encouraged that you remove your dressing and packing, shower with soap & water, and replace your dressing once a day.    Cober the wound with clean gauze or BandAid    Eventually your body will heal & pull the open wound closed over the next few weeks.  Raw open wounds will occasionally bleed or secrete yellow drainage until it heals closed.   Pressure on the dressing for 30 minutes will stop most wound bleeding Drain sites will drain a little after the drain is removed - it will dry up within  a few days   02/04/19 0814   01/30/19 0000  Discharge wound care:    Comments: It is good for closed incisions and even open wounds to be washed every day.  Shower every day.  Short baths are fine.  Wash the incisions and wounds clean with soap & water.    You may leave closed incisions open to air if it is dry.   You may cover the incision with clean gauze & replace it after your daily shower for comfort.   01/30/19 1028          Significant Diagnostic Studies:  Results for orders placed or performed during the hospital encounter of 01/30/19 (from the past 72 hour(s))  CBC     Status: Abnormal   Collection Time: 02/03/19  8:34 AM  Result Value Ref Range   WBC 15.5 (H) 4.0 - 10.5 K/uL   RBC 3.04 (L) 3.87 - 5.11 MIL/uL   Hemoglobin 10.8 (L) 12.0 - 15.0 g/dL   HCT 33.2 (L) 36.0 - 46.0 %   MCV 109.2 (H) 80.0 - 100.0 fL   MCH 35.5 (H) 26.0 - 34.0 pg   MCHC 32.5 30.0 - 36.0 g/dL   RDW 12.9 11.5 - 15.5 %   Platelets 284 150 - 400 K/uL   nRBC 0.0 0.0 - 0.2 %    Comment: Performed at [facility], [place] [address] [place], [place] [address]  Basic metabolic panel     Status: Abnormal   Collection Time: 02/03/19  8:34 AM  Result Value Ref Range   Sodium 134 (L) 135 - 145 mmol/L   Potassium 3.7 3.5 - 5.1 mmol/L   Chloride 92 (L) 98 - 111 mmol/L   CO2 28 22 - 32 mmol/L   Glucose, Bld 102 (H) 70 - 99 mg/dL   BUN 8 6 - 20 mg/dL   Creatinine, Ser 0.49 0.44 - 1.00 mg/dL   Calcium 8.5 (L) 8.9 - 10.3 mg/dL   GFR calc non Af Amer >60 >60 mL/min   GFR calc Af Amer >60 >60 mL/min   Anion gap 14 5 - 15    Comment: Performed at [facility], [place] [address], [place], [place] [address]  CBC     Status: Abnormal   Collection Time: 02/04/19  3:17 AM  Result Value Ref Range   WBC 10.1 4.0 - 10.5 K/uL   RBC 2.77 (L) 3.87 - 5.11 MIL/uL   Hemoglobin 9.4 (L) 12.0 - 15.0 g/dL   HCT 30.1 (L) 36.0 - 46.0 %   MCV 108.7 (H) 80.0 - 100.0 fL   MCH 33.9 26.0 - 34.0 pg    MCHC 31.2 30.0 - 36.0 g/dL   RDW 13.1 11.5 - 15.5 %   Platelets 301 150 - 400 K/uL   nRBC 0.0 0.0 - 0.2 %    Comment: Performed at [facility],  [place] [address], [place], [place] [address]    No results found.  Past Medical History:  Diagnosis Date  . Abnormal Pap smear    cryo  . Allergy   . Anemia   . Bone fracture    ankle  . Cancer [facility]) 1987   cervical Cancer  . COPD (chronic obstructive pulmonary disease) ([place])   . Emphysema   . Emphysema of lung ([place])   . GERD (gastroesophageal reflux disease)   . Headache(784.0)   . Hypertension   . Infection    urinary tract infection  . MS (multiple sclerosis) ([place])   . Neuromuscular disorder ([place])    hands and feet face and lip and muscle weakness due to MS  . Pelvic abscess in female   . Sepsis due to Escherichia coli (E. coli) ([place]) 03/09/2018    Past Surgical History:  Procedure Laterality Date  . ARM WOUND REPAIR / CLOSURE    . BREAST BIOPSY Left 07/03/2012  . COLPOSCOPY    . DILATION AND CURETTAGE OF UTERUS    . IR CATHETER TUBE CHANGE  04/04/2018  . IR CATHETER TUBE CHANGE  05/16/2018  . IR CATHETER TUBE CHANGE  07/05/2018  . IR CATHETER TUBE CHANGE  09/21/2018  . IR RADIOLOGIST EVAL & MGMT  03/29/2018  . IR RADIOLOGIST EVAL & MGMT  04/12/2018  . JP DRAIN TUBE    . PROCTOSCOPY N/A 01/30/2019   Procedure: RIGID PROCTOSCOPY;  Surgeon: [name], MD;  Location: [facility];  Service: General;  Laterality: N/A;  . ROBOT ASSISTED LAPAROSCOPIC PARTIAL COLECTOMY  01/30/2019   for diverticulitis.  LSO as well  . SALPINGOOPHORECTOMY Left 01/30/2019   LSO at time of sigmoid colectomy  . TUBAL LIGATION      Social History   Socioeconomic History  . Marital status: Legally Separated    Spouse name: Not on file  . Number of children: Not on file  . Years of education: Not on file  . Highest education level: Not on file  Occupational History  . Not on file  Social Needs  . Financial resource strain:  Not on file  . Food insecurity    Worry: Not on file    Inability: Not on file  . Transportation needs    Medical: Not on file    Non-medical: Not on file  Tobacco Use  . Smoking status: Current Every Day Smoker    Packs/day: 1.00    Years: 15.00    Pack years: 15.00    Types: Cigarettes  . Smokeless tobacco: Former Network engineer and Sexual Activity  . Alcohol use: Yes    Comment: occ  . Drug use: No  . Sexual activity: Yes    Birth control/protection: Surgical  Lifestyle  . Physical activity    Days per week: Not on file    Minutes per session: Not on file  . Stress: Not on file  Relationships  . Social Herbalist on phone: Not on file    Gets together: Not on file    Attends religious service: Not on file    Active member of club or organization: Not on file    Attends meetings of clubs or organizations: Not on file    Relationship status: Not on file  . Intimate partner violence    Fear of current or ex partner: Not on file    Emotionally abused: Not on file    Physically abused: Not on file  Forced sexual activity: Not on file  Other Topics Concern  . Not on file  Social History Narrative   ** Merged History Encounter **        Family History  Problem Relation Age of Onset  . Diabetes Father   . Diabetes Sister   . Hypertension Sister   . Diabetes Brother   . Hypertension Brother   . Lung cancer Mother   . Colon cancer Neg Hx   . Stomach cancer Neg Hx   . Pancreatic cancer Neg Hx   . Esophageal cancer Neg Hx   . Rectal cancer Neg Hx     Current Facility-Administered Medications  Medication Dose Route Frequency Provider Last Rate Last Dose  . 0.9 %  sodium chloride infusion  250 mL Intravenous PRN [name], MD      . acetaminophen (TYLENOL) tablet 1,000 mg  1,000 mg Oral Lajuana Ripple, MD   1,000 mg at 02/04/19 0511  . albuterol (PROVENTIL) (2.5 MG/3ML) 0.083% nebulizer solution 2.5 mg  2.5 mg Nebulization Q6H PRN Thomes Lolling, RPH      . alum & mag hydroxide-simeth (MAALOX/MYLANTA) 200-200-20 MG/5ML suspension 30 mL  30 mL Oral Q6H PRN [name], MD   30 mL at 02/03/19 1043  . carvedilol (COREG) tablet 3.125 mg  3.125 mg Oral BID WC [name], MD   3.125 mg at 02/03/19 1851  . diphenhydrAMINE (BENADRYL) 12.5 MG/5ML elixir 12.5 mg  12.5 mg Oral Q6H PRN [name], MD       Or  . diphenhydrAMINE (BENADRYL) injection 12.5 mg  12.5 mg Intravenous Q6H PRN [name], MD      . enoxaparin (LOVENOX) injection 40 mg  40 mg Subcutaneous Q24H [name], MD   40 mg at 02/03/19 1043  . feeding supplement (ENSURE SURGERY) liquid 237 mL  237 mL Oral BID BM [name], MD   237 mL at 02/03/19 1414  . gabapentin (NEURONTIN) capsule 400 mg  400 mg Oral TID [name], MD      . hydrALAZINE (APRESOLINE) injection 10 mg  10 mg Intravenous Q2H PRN [name], MD      . HYDROmorphone (DILAUDID) injection 0.5-2 mg  0.5-2 mg Intravenous Q4H PRN [name], MD   1 mg at 01/30/19 1810  . lip balm (CARMEX) ointment 1 application  1 application Topical BID [name], MD   1 application at 47/65/46 2138  . LORazepam (ATIVAN) injection 0.5-1 mg  0.5-1 mg Intravenous Q8H PRN [name], MD   1 mg at 02/02/19 0149  . magic mouthwash  15 mL Oral QID PRN [name], MD      . methocarbamol (ROBAXIN) tablet 750 mg  750 mg Oral Q6H PRN [name], MD      . metoprolol tartrate (LOPRESSOR) injection 5 mg  5 mg Intravenous Q6H PRN [name], MD      . nicotine (NICODERM CQ - dosed in mg/24 hours) patch 14 mg  14 mg Transdermal Daily [name], MD   14 mg at 02/03/19 1044  . ondansetron (ZOFRAN) tablet 4 mg  4 mg Oral Q6H PRN [name], MD   4 mg at 02/03/19 1043   Or  . ondansetron (ZOFRAN) injection 4 mg  4 mg Intravenous Q6H PRN [name], MD   4 mg at 01/30/19 1836  . prochlorperazine (COMPAZINE) tablet 10 mg  10 mg Oral Q6H PRN [name], MD       Or  .  prochlorperazine  (COMPAZINE) injection 5-10 mg  5-10 mg Intravenous Q6H PRN [name], MD      . saccharomyces boulardii (FLORASTOR) capsule 250 mg  250 mg Oral BID [name], MD   250 mg at 02/03/19 2141  . sodium chloride flush (NS) 0.9 % injection 3 mL  3 mL Intravenous Gorden Harms, MD   3 mL at 02/03/19 2138  . sodium chloride flush (NS) 0.9 % injection 3 mL  3 mL Intravenous PRN [name], MD      . traMADol Veatrice Bourbon) tablet 100 mg  100 mg Oral Q6H PRN [name], MD         Allergies  Allergen Reactions  . Hydrocodone Nausea And Vomiting    Signed: Morton Peters, MD, FACS, MASCRS Gastrointestinal and Minimally Invasive Surgery    [address], [place], [place] [address] [phone] Main / Paging [phone] Fax   02/04/2019, 8:15 AM

## 2019-02-12 ENCOUNTER — Encounter (HOSPITAL_COMMUNITY): Payer: Self-pay

## 2019-02-12 ENCOUNTER — Other Ambulatory Visit: Payer: Self-pay | Admitting: Surgery

## 2019-02-12 ENCOUNTER — Other Ambulatory Visit: Payer: Self-pay | Admitting: Student

## 2019-02-12 ENCOUNTER — Ambulatory Visit (HOSPITAL_COMMUNITY)
Admission: RE | Admit: 2019-02-12 | Discharge: 2019-02-12 | Disposition: A | Discharge status: Home / Self Care | Payer: Medicaid Other | Source: Ambulatory Visit | Attending: Surgery | Admitting: Surgery

## 2019-02-12 ENCOUNTER — Other Ambulatory Visit (HOSPITAL_COMMUNITY): Payer: Self-pay | Admitting: Surgery

## 2019-02-12 ENCOUNTER — Other Ambulatory Visit: Payer: Self-pay

## 2019-02-12 ENCOUNTER — Inpatient Hospital Stay (HOSPITAL_COMMUNITY)
Admission: AD | Admit: 2019-02-12 | Discharge: 2019-02-15 | DRG: 392 | Disposition: A | Discharge status: Home / Self Care | Payer: Medicaid Other | Source: Ambulatory Visit | Attending: Surgery | Admitting: Surgery

## 2019-02-12 DIAGNOSIS — Z9049 Acquired absence of other specified parts of digestive tract: Secondary | ICD-10-CM

## 2019-02-12 DIAGNOSIS — Z598 Other problems related to housing and economic circumstances: Secondary | ICD-10-CM

## 2019-02-12 DIAGNOSIS — J439 Emphysema, unspecified: Secondary | ICD-10-CM | POA: Diagnosis present

## 2019-02-12 DIAGNOSIS — K5720 Diverticulitis of large intestine with perforation and abscess without bleeding: Principal | ICD-10-CM

## 2019-02-12 DIAGNOSIS — F411 Generalized anxiety disorder: Secondary | ICD-10-CM | POA: Diagnosis present

## 2019-02-12 DIAGNOSIS — G35 Multiple sclerosis: Secondary | ICD-10-CM | POA: Diagnosis present

## 2019-02-12 DIAGNOSIS — I10 Essential (primary) hypertension: Secondary | ICD-10-CM | POA: Diagnosis present

## 2019-02-12 DIAGNOSIS — F17210 Nicotine dependence, cigarettes, uncomplicated: Secondary | ICD-10-CM | POA: Diagnosis present

## 2019-02-12 DIAGNOSIS — F17200 Nicotine dependence, unspecified, uncomplicated: Secondary | ICD-10-CM | POA: Diagnosis present

## 2019-02-12 DIAGNOSIS — Z833 Family history of diabetes mellitus: Secondary | ICD-10-CM

## 2019-02-12 DIAGNOSIS — Z599 Problem related to housing and economic circumstances, unspecified: Secondary | ICD-10-CM

## 2019-02-12 DIAGNOSIS — J449 Chronic obstructive pulmonary disease, unspecified: Secondary | ICD-10-CM | POA: Diagnosis present

## 2019-02-12 DIAGNOSIS — K632 Fistula of intestine: Secondary | ICD-10-CM | POA: Diagnosis present

## 2019-02-12 DIAGNOSIS — Z8249 Family history of ischemic heart disease and other diseases of the circulatory system: Secondary | ICD-10-CM

## 2019-02-12 DIAGNOSIS — Z801 Family history of malignant neoplasm of trachea, bronchus and lung: Secondary | ICD-10-CM

## 2019-02-12 DIAGNOSIS — K651 Peritoneal abscess: Secondary | ICD-10-CM | POA: Diagnosis present

## 2019-02-12 HISTORY — DX: Diverticulitis of large intestine with perforation and abscess without bleeding: K57.20

## 2019-02-12 LAB — CBC WITH DIFFERENTIAL/PLATELET
Abs Immature Granulocytes: 0.08 10*3/uL — ABNORMAL HIGH (ref 0.00–0.07)
Basophils Absolute: 0.1 10*3/uL (ref 0.0–0.1)
Basophils Relative: 1 %
Eosinophils Absolute: 0.2 10*3/uL (ref 0.0–0.5)
Eosinophils Relative: 1 %
HCT: 34.6 % — ABNORMAL LOW (ref 36.0–46.0)
Hemoglobin: 11.1 g/dL — ABNORMAL LOW (ref 12.0–15.0)
Immature Granulocytes: 1 %
Lymphocytes Relative: 16 %
Lymphs Abs: 2.7 10*3/uL (ref 0.7–4.0)
MCH: 33.6 pg (ref 26.0–34.0)
MCHC: 32.1 g/dL (ref 30.0–36.0)
MCV: 104.8 fL — ABNORMAL HIGH (ref 80.0–100.0)
Monocytes Absolute: 1.4 10*3/uL — ABNORMAL HIGH (ref 0.1–1.0)
Monocytes Relative: 8 %
Neutro Abs: 12.1 10*3/uL — ABNORMAL HIGH (ref 1.7–7.7)
Neutrophils Relative %: 73 %
Platelets: 738 10*3/uL — ABNORMAL HIGH (ref 150–400)
RBC: 3.3 MIL/uL — ABNORMAL LOW (ref 3.87–5.11)
RDW: 13.2 % (ref 11.5–15.5)
WBC: 16.6 10*3/uL — ABNORMAL HIGH (ref 4.0–10.5)
nRBC: 0 % (ref 0.0–0.2)

## 2019-02-12 LAB — COMPREHENSIVE METABOLIC PANEL
ALT: 12 U/L (ref 0–44)
AST: 24 U/L (ref 15–41)
Albumin: 3 g/dL — ABNORMAL LOW (ref 3.5–5.0)
Alkaline Phosphatase: 80 U/L (ref 38–126)
Anion gap: 13 (ref 5–15)
BUN: 7 mg/dL (ref 6–20)
CO2: 25 mmol/L (ref 22–32)
Calcium: 8.5 mg/dL — ABNORMAL LOW (ref 8.9–10.3)
Chloride: 90 mmol/L — ABNORMAL LOW (ref 98–111)
Creatinine, Ser: 0.52 mg/dL (ref 0.44–1.00)
GFR calc Af Amer: >60 mL/min (ref >60)
GFR calc non Af Amer: >60 mL/min (ref >60)
Glucose, Bld: 94 mg/dL (ref 70–99)
Potassium: 3.6 mmol/L (ref 3.5–5.1)
Sodium: 128 mmol/L — ABNORMAL LOW (ref 135–145)
Total Bilirubin: 0.7 mg/dL (ref 0.3–1.2)
Total Protein: 7 g/dL (ref 6.5–8.1)

## 2019-02-12 MED ORDER — DOCUSATE SODIUM 100 MG PO CAPS
100.0000 mg | ORAL_CAPSULE | Freq: Two times a day (BID) | ORAL | Status: DC
  Administered 2019-02-12: 100 mg via ORAL
  Filled 2019-02-12: qty 1

## 2019-02-12 MED ORDER — ONDANSETRON 4 MG PO TBDP: 4.0000 mg | ORAL_TABLET | Freq: Four times a day (QID) | ORAL | Status: DC | PRN

## 2019-02-12 MED ORDER — SIMETHICONE 80 MG PO CHEW: 40.0000 mg | CHEWABLE_TABLET | Freq: Four times a day (QID) | ORAL | Status: DC | PRN

## 2019-02-12 MED ORDER — SODIUM CHLORIDE (PF) 0.9 % IJ SOLN
INTRAMUSCULAR | Status: AC
  Filled 2019-02-12: qty 50

## 2019-02-12 MED ORDER — MORPHINE SULFATE (PF) 2 MG/ML IV SOLN: 2.0000 mg | INTRAVENOUS | Status: DC | PRN

## 2019-02-12 MED ORDER — ACETAMINOPHEN 650 MG RE SUPP: 650.0000 mg | Freq: Four times a day (QID) | RECTAL | Status: DC | PRN

## 2019-02-12 MED ORDER — OXYCODONE HCL 5 MG PO TABS
5.0000 mg | ORAL_TABLET | ORAL | Status: DC | PRN
  Administered 2019-02-12: 5 mg via ORAL
  Administered 2019-02-13 (×2): 10 mg via ORAL
  Administered 2019-02-13: 5 mg via ORAL
  Administered 2019-02-13 – 2019-02-14 (×2): 10 mg via ORAL
  Filled 2019-02-12 (×2): qty 2
  Filled 2019-02-12: qty 1
  Filled 2019-02-12 (×2): qty 2
  Filled 2019-02-12: qty 1

## 2019-02-12 MED ORDER — POLYETHYLENE GLYCOL 3350 17 G PO PACK: 17.0000 g | PACK | Freq: Every day | ORAL | Status: DC | PRN

## 2019-02-12 MED ORDER — ACETAMINOPHEN 325 MG PO TABS: 650.0000 mg | ORAL_TABLET | Freq: Four times a day (QID) | ORAL | Status: DC | PRN

## 2019-02-12 MED ORDER — PANTOPRAZOLE SODIUM 40 MG IV SOLR
40.0000 mg | Freq: Every day | INTRAVENOUS | Status: DC
  Administered 2019-02-12: 40 mg via INTRAVENOUS
  Filled 2019-02-12: qty 40

## 2019-02-12 MED ORDER — PIPERACILLIN-TAZOBACTAM 3.375 G IVPB
3.3750 g | Freq: Three times a day (TID) | INTRAVENOUS | Status: DC
  Administered 2019-02-12 – 2019-02-15 (×8): 3.375 g via INTRAVENOUS
  Filled 2019-02-12 (×9): qty 50

## 2019-02-12 MED ORDER — METHOCARBAMOL 500 MG PO TABS
500.0000 mg | ORAL_TABLET | Freq: Four times a day (QID) | ORAL | Status: DC | PRN
  Administered 2019-02-13: 500 mg via ORAL
  Filled 2019-02-12: qty 1

## 2019-02-12 MED ORDER — SODIUM CHLORIDE 0.9 % IV SOLN
INTRAVENOUS | Status: DC
  Administered 2019-02-12: 21:00:00 via INTRAVENOUS

## 2019-02-12 MED ORDER — IOHEXOL 300 MG/ML  SOLN
100.0000 mL | Freq: Once | INTRAMUSCULAR | Status: AC | PRN
  Administered 2019-02-12: 100 mL via INTRAVENOUS

## 2019-02-12 MED ORDER — DIPHENHYDRAMINE HCL 50 MG/ML IJ SOLN: 25.0000 mg | Freq: Four times a day (QID) | INTRAMUSCULAR | Status: DC | PRN

## 2019-02-12 MED ORDER — IOHEXOL 300 MG/ML  SOLN
30.0000 mL | Freq: Once | INTRAMUSCULAR | Status: AC | PRN
  Administered 2019-02-12: 30 mL via ORAL

## 2019-02-12 MED ORDER — ONDANSETRON HCL 4 MG/2ML IJ SOLN: 4.0000 mg | Freq: Four times a day (QID) | INTRAMUSCULAR | Status: DC | PRN

## 2019-02-12 MED ORDER — HYDRALAZINE HCL 20 MG/ML IJ SOLN: 10.0000 mg | INTRAMUSCULAR | Status: DC | PRN

## 2019-02-12 MED ORDER — DIPHENHYDRAMINE HCL 25 MG PO CAPS: 25.0000 mg | ORAL_CAPSULE | Freq: Four times a day (QID) | ORAL | Status: DC | PRN

## 2019-02-12 NOTE — H&P (Signed)
Previous H&P by Dr. Johney Maine: Patient sent for surgical consultation at the request of Dr Silverio Decamp  Chief Complaint: Intra-abdominal abscess. Inflammatory bowel disease versus diverticulitis etiology. ` ` The patient is a pleasant woman with intermittent abdominal pain as that progressed to the point of pain and intra-abdominal abscess. An area of inflammation between her ileum and sigmoid colon. Suspicious for inflammatory bowel disease. Admitted and placed on IV antibiotics. Drainage of the abscess done 03/10/2018. Percutaneous drain. Discharge. Discussions between general surgery and gastroenterology. IBD workup done. No proof of any ileitis or inflammatory bowel disease by endoscopy or biopsy. No major concerns for inflammatory bowel disease on CT enterography. 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 Still there as of late October.   Given the negative inflammatory bowel workup, the assumption he was more diverticulitis. I discussed about colectomy if the rest of the workup was negative. Put in orders a few weeks ago. It has been a challenge to get a hold of her. I believe she's been stressed out by bills and financial issues. Applied for Medicaid in the hospital according to her. That seemed to be lost and fallen through. Reapplying again. Still has the drain. Stitches have pulled through but drain is in place. She's been flushing it. It flushes right back out at the skin which scared her at first. Still some brown drainage into the drainage bag. Not high-volume. She is eating better but appetite not normal. She's moving her bowels every day. Sensitive at the drain site but otherwise feeling better. Smoking still, but she is trying to back off. She's here with her significant other. Appetite and energy level are not back to normal but certainly improved and when she was in the hospital.  Interval history: She underwent robot assisted low anterior  resection and drainage of abscess of anterior pelvis and abdominal wall on 01/30/19 by Dr. Johney Maine.  Discharged on 02/04/19.  Called the office on 02/11/19 reporting decreased appetite, weakness, and vomiting.she also reports that when she urinates, she feels pressure.  Vomited today and a few days ago.  Complains of pain when lying on her side.   Dr. Johney Maine ordered a CT scan and blood work.  Plan: She underwent CT abdomen and pelvis earlier today which showed a 4 cm diverticular abscess.  Dr. Jasmine December called with the report.  She did not have other labs done yet.  Dr. Johney Maine was contacted who recommended admission to the hospital, IV antibiotics, labs, and order for perc drain.  [facility] was called to request a bed.  Patient was called with update on imaging results and plan.

## 2019-02-12 NOTE — H&P (Deleted)
  The note originally documented on this encounter has been moved the the encounter in which it belongs.  

## 2019-02-13 ENCOUNTER — Encounter (HOSPITAL_COMMUNITY): Payer: Self-pay | Admitting: Surgery

## 2019-02-13 ENCOUNTER — Inpatient Hospital Stay (HOSPITAL_COMMUNITY): Payer: Medicaid Other

## 2019-02-13 DIAGNOSIS — I10 Essential (primary) hypertension: Secondary | ICD-10-CM | POA: Diagnosis present

## 2019-02-13 DIAGNOSIS — J439 Emphysema, unspecified: Secondary | ICD-10-CM | POA: Diagnosis present

## 2019-02-13 DIAGNOSIS — K632 Fistula of intestine: Secondary | ICD-10-CM | POA: Diagnosis present

## 2019-02-13 DIAGNOSIS — G35 Multiple sclerosis: Secondary | ICD-10-CM | POA: Diagnosis present

## 2019-02-13 DIAGNOSIS — F17210 Nicotine dependence, cigarettes, uncomplicated: Secondary | ICD-10-CM | POA: Diagnosis present

## 2019-02-13 DIAGNOSIS — F411 Generalized anxiety disorder: Secondary | ICD-10-CM | POA: Diagnosis present

## 2019-02-13 DIAGNOSIS — K5720 Diverticulitis of large intestine with perforation and abscess without bleeding: Secondary | ICD-10-CM | POA: Diagnosis present

## 2019-02-13 DIAGNOSIS — Z801 Family history of malignant neoplasm of trachea, bronchus and lung: Secondary | ICD-10-CM | POA: Diagnosis not present

## 2019-02-13 DIAGNOSIS — Z833 Family history of diabetes mellitus: Secondary | ICD-10-CM | POA: Diagnosis not present

## 2019-02-13 DIAGNOSIS — Z599 Problem related to housing and economic circumstances, unspecified: Secondary | ICD-10-CM | POA: Diagnosis not present

## 2019-02-13 DIAGNOSIS — Z8249 Family history of ischemic heart disease and other diseases of the circulatory system: Secondary | ICD-10-CM | POA: Diagnosis not present

## 2019-02-13 LAB — CBC
HCT: 33.4 % — ABNORMAL LOW (ref 36.0–46.0)
Hemoglobin: 10.6 g/dL — ABNORMAL LOW (ref 12.0–15.0)
MCH: 33.1 pg (ref 26.0–34.0)
MCHC: 31.7 g/dL (ref 30.0–36.0)
MCV: 104.4 fL — ABNORMAL HIGH (ref 80.0–100.0)
Platelets: 666 10*3/uL — ABNORMAL HIGH (ref 150–400)
RBC: 3.2 MIL/uL — ABNORMAL LOW (ref 3.87–5.11)
RDW: 13.2 % (ref 11.5–15.5)
WBC: 12.8 10*3/uL — ABNORMAL HIGH (ref 4.0–10.5)
nRBC: 0 % (ref 0.0–0.2)

## 2019-02-13 LAB — BASIC METABOLIC PANEL
Anion gap: 10 (ref 5–15)
BUN: 5 mg/dL — ABNORMAL LOW (ref 6–20)
CO2: 28 mmol/L (ref 22–32)
Calcium: 8.4 mg/dL — ABNORMAL LOW (ref 8.9–10.3)
Chloride: 94 mmol/L — ABNORMAL LOW (ref 98–111)
Creatinine, Ser: 0.57 mg/dL (ref 0.44–1.00)
GFR calc Af Amer: >60 mL/min (ref >60)
GFR calc non Af Amer: >60 mL/min (ref >60)
Glucose, Bld: 93 mg/dL (ref 70–99)
Potassium: 4.3 mmol/L (ref 3.5–5.1)
Sodium: 132 mmol/L — ABNORMAL LOW (ref 135–145)

## 2019-02-13 LAB — PROTIME-INR
INR: 1 (ref 0.8–1.2)
Prothrombin Time: 12.6 seconds (ref 11.4–15.2)

## 2019-02-13 MED ORDER — ALBUTEROL SULFATE (2.5 MG/3ML) 0.083% IN NEBU: 2.5000 mg | INHALATION_SOLUTION | Freq: Four times a day (QID) | RESPIRATORY_TRACT | Status: DC | PRN

## 2019-02-13 MED ORDER — GUAIFENESIN-DM 100-10 MG/5ML PO SYRP: 10.0000 mL | ORAL_SOLUTION | ORAL | Status: DC | PRN

## 2019-02-13 MED ORDER — HYDRALAZINE HCL 20 MG/ML IJ SOLN: 5.0000 mg | INTRAMUSCULAR | Status: DC | PRN

## 2019-02-13 MED ORDER — LACTATED RINGERS IV BOLUS
1000.0000 mL | Freq: Once | INTRAVENOUS | Status: AC
  Administered 2019-02-13: 1000 mL via INTRAVENOUS

## 2019-02-13 MED ORDER — METOPROLOL TARTRATE 5 MG/5ML IV SOLN: 5.0000 mg | Freq: Four times a day (QID) | INTRAVENOUS | Status: DC | PRN

## 2019-02-13 MED ORDER — ALUM & MAG HYDROXIDE-SIMETH 200-200-20 MG/5ML PO SUSP: 30.0000 mL | Freq: Four times a day (QID) | ORAL | Status: DC | PRN

## 2019-02-13 MED ORDER — MAGIC MOUTHWASH
15.0000 mL | Freq: Four times a day (QID) | ORAL | Status: DC | PRN
  Filled 2019-02-13: qty 15

## 2019-02-13 MED ORDER — TRAMADOL HCL 50 MG PO TABS: 50.0000 mg | ORAL_TABLET | Freq: Four times a day (QID) | ORAL | Status: DC | PRN

## 2019-02-13 MED ORDER — LIP MEDEX EX OINT
1.0000 "application " | TOPICAL_OINTMENT | Freq: Two times a day (BID) | CUTANEOUS | Status: DC
  Administered 2019-02-13 – 2019-02-15 (×5): 1 via TOPICAL
  Filled 2019-02-13 (×2): qty 7

## 2019-02-13 MED ORDER — GABAPENTIN 300 MG PO CAPS
300.0000 mg | ORAL_CAPSULE | Freq: Three times a day (TID) | ORAL | Status: DC
  Administered 2019-02-13 – 2019-02-15 (×6): 300 mg via ORAL
  Filled 2019-02-13 (×6): qty 1

## 2019-02-13 MED ORDER — FLUCONAZOLE 100 MG PO TABS
200.0000 mg | ORAL_TABLET | Freq: Every day | ORAL | Status: DC
  Administered 2019-02-13 – 2019-02-15 (×3): 200 mg via ORAL
  Filled 2019-02-13 (×3): qty 2

## 2019-02-13 MED ORDER — LORAZEPAM 2 MG/ML IJ SOLN
0.5000 mg | Freq: Three times a day (TID) | INTRAMUSCULAR | Status: DC | PRN
  Administered 2019-02-13 – 2019-02-14 (×2): 1 mg via INTRAVENOUS
  Filled 2019-02-13 (×2): qty 1

## 2019-02-13 MED ORDER — PHENOL 1.4 % MT LIQD
1.0000 | OROMUCOSAL | Status: DC | PRN
  Filled 2019-02-13: qty 177

## 2019-02-13 MED ORDER — HYDROCORTISONE (PERIANAL) 2.5 % EX CREA
1.0000 "application " | TOPICAL_CREAM | Freq: Four times a day (QID) | CUTANEOUS | Status: DC | PRN
  Filled 2019-02-13: qty 28.35

## 2019-02-13 MED ORDER — LACTATED RINGERS IV BOLUS: 1000.0000 mL | Freq: Three times a day (TID) | INTRAVENOUS | Status: AC | PRN

## 2019-02-13 MED ORDER — FENTANYL CITRATE (PF) 100 MCG/2ML IJ SOLN
INTRAMUSCULAR | Status: AC
  Filled 2019-02-13: qty 2

## 2019-02-13 MED ORDER — MIDAZOLAM HCL 2 MG/2ML IJ SOLN
INTRAMUSCULAR | Status: AC
  Filled 2019-02-13: qty 4

## 2019-02-13 MED ORDER — KCL IN DEXTROSE-NACL 40-5-0.45 MEQ/L-%-% IV SOLN
INTRAVENOUS | Status: DC
  Administered 2019-02-13 – 2019-02-15 (×5): via INTRAVENOUS
  Filled 2019-02-13 (×5): qty 1000

## 2019-02-13 MED ORDER — METOCLOPRAMIDE HCL 5 MG/ML IJ SOLN: 5.0000 mg | Freq: Three times a day (TID) | INTRAMUSCULAR | Status: DC | PRN

## 2019-02-13 MED ORDER — FENTANYL CITRATE (PF) 100 MCG/2ML IJ SOLN
INTRAMUSCULAR | Status: AC | PRN
  Administered 2019-02-13 (×2): 50 ug via INTRAVENOUS

## 2019-02-13 MED ORDER — MIDAZOLAM HCL 2 MG/2ML IJ SOLN
INTRAMUSCULAR | Status: AC | PRN
  Administered 2019-02-13 (×2): 1 mg via INTRAVENOUS

## 2019-02-13 MED ORDER — HYDROMORPHONE HCL 1 MG/ML IJ SOLN: 0.5000 mg | INTRAMUSCULAR | Status: DC | PRN

## 2019-02-13 MED ORDER — MENTHOL 3 MG MT LOZG: 1.0000 | LOZENGE | OROMUCOSAL | Status: DC | PRN

## 2019-02-13 MED ORDER — WITCH HAZEL-GLYCERIN EX PADS
1.0000 "application " | MEDICATED_PAD | CUTANEOUS | Status: DC | PRN
  Filled 2019-02-13: qty 100

## 2019-02-13 MED ORDER — METHOCARBAMOL 1000 MG/10ML IJ SOLN
1000.0000 mg | Freq: Four times a day (QID) | INTRAVENOUS | Status: DC | PRN
  Filled 2019-02-13: qty 10

## 2019-02-13 MED ORDER — LIDOCAINE HCL (PF) 1 % IJ SOLN
INTRAMUSCULAR | Status: AC | PRN
  Administered 2019-02-13: 10 mL

## 2019-02-13 MED ORDER — CARVEDILOL 3.125 MG PO TABS
3.1250 mg | ORAL_TABLET | Freq: Two times a day (BID) | ORAL | Status: DC
  Administered 2019-02-13 – 2019-02-15 (×4): 3.125 mg via ORAL
  Filled 2019-02-13 (×4): qty 1

## 2019-02-13 MED ORDER — SODIUM CHLORIDE 0.9% FLUSH
5.0000 mL | Freq: Three times a day (TID) | INTRAVENOUS | Status: DC
  Administered 2019-02-13 – 2019-02-15 (×5): 5 mL

## 2019-02-13 MED ORDER — ACETAMINOPHEN 325 MG PO TABS: 325.0000 mg | ORAL_TABLET | Freq: Four times a day (QID) | ORAL | Status: DC | PRN

## 2019-02-13 MED ORDER — HYDROCORTISONE 1 % EX CREA
1.0000 "application " | TOPICAL_CREAM | Freq: Three times a day (TID) | CUTANEOUS | Status: DC | PRN
  Filled 2019-02-13: qty 28

## 2019-02-13 NOTE — Procedures (Signed)
Interventional Radiology Procedure Note  Procedure: Placement of a 82F drain into the LLQ fluid collection.  Aspiration yields frankly purulent fluid.  Sample sent for cx.   Complications: None  Estimated Blood Loss: None  Recommendations: - Drain to JP - FLush every shift - Cx sent  Signed,  Criselda Peaches, MD

## 2019-02-13 NOTE — Progress Notes (Signed)
Report given to [facility]. Patient transferred w/ all belongings to [id number].

## 2019-02-13 NOTE — Consult Note (Signed)
Chief Complaint: Patient was seen in consultation today for intra-abdominal fluid collection  Referring Physician(s): Dr. Johney Maine  Supervising Physician: Jacqulynn Cadet  Patient Status: [facility] - In-pt  History of Present Illness: Christina Miller is a 53 y.o. 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  Patient maintained drain and underwent several drain changes over the past 11 months for chronic fistula until she was able to undergo anterior low wall resection with Dr. Johney Maine 01/30/19 at which time drain was removed.  Patient returned to her surgeon's office 7/28 with abdominal pressure and vomiting.   CT Abdomen Pelvis showed: 1. Sigmoid diverticulitis with extensive pelvic inflammation. There is a focal abscess arising in this area of diverticulitis measuring 3.7 x 2.9 cm. An apparent sinus tract is seen between the area of diverticulitis in the left rectus muscle without air or fluid in this tract or in the rectus muscle. There is thickening of the rectus muscle in this area focally on the left. No pneumoperitoneum evident.  2. Rectosigmoid junction anastomosis appears patent, although there is inflammatory change in this area. A nearby fluid collection measures 3.9 x 2.5 cm which does not contain air. Question postoperative seroma versus infected fluid. The attenuation value of this fluid is borderline high for purely serous fluid.  3.  No bowel obstruction.  Appendix region appears unremarkable.  4. Thickening of the urinary bladder wall may indicate intrinsic cystitis but likely is due to nearby inflammation from the diverticulitis and diverticular abscess.  She was admitted for IV abx and management of her intra-abdominal fluid collection.  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  Patient been NPO today.    Past Medical History:  Diagnosis Date   Abnormal Pap smear    cryo   Allergy    Anemia    Bone fracture    ankle   Cancer ([place]) 1987   cervical Cancer   Colonic diverticular abscess 02/12/2019   COPD (chronic obstructive pulmonary disease) (HCC)    Emphysema    Emphysema of lung (HCC)    GERD (gastroesophageal reflux disease)    Headache(784.0)    Hypertension    Infection    urinary tract infection   MS (multiple sclerosis) ([place])    Neuromuscular disorder ([place])    hands and feet face and lip and muscle weakness due to MS   Pelvic abscess in female    Sepsis due to Escherichia coli (E. coli) ([place]) 03/09/2018    Past Surgical History:  Procedure Laterality Date   ARM WOUND REPAIR / CLOSURE     BREAST BIOPSY Left 07/03/2012   COLPOSCOPY     DILATION AND CURETTAGE OF UTERUS     IR CATHETER TUBE CHANGE  04/04/2018   IR CATHETER TUBE CHANGE  05/16/2018   IR CATHETER TUBE CHANGE  07/05/2018   IR CATHETER TUBE CHANGE  09/21/2018   IR RADIOLOGIST EVAL & MGMT  03/29/2018   IR RADIOLOGIST EVAL & MGMT  04/12/2018   JP DRAIN TUBE     PROCTOSCOPY N/A 01/30/2019   Procedure: RIGID PROCTOSCOPY;  Surgeon: [name], MD;  Location: [facility];  Service: General;  Laterality: N/A;   ROBOT ASSISTED LAPAROSCOPIC PARTIAL COLECTOMY  01/30/2019   for diverticulitis.  LSO  as well   SALPINGOOPHORECTOMY Left 01/30/2019   LSO at time of sigmoid colectomy   TUBAL LIGATION      Allergies: Hydrocodone  Medications: Prior to Admission medications   Medication Sig Start Date End Date Taking? Authorizing Provider  albuterol (PROVENTIL HFA;VENTOLIN HFA) 108 (90 Base) MCG/ACT inhaler Inhale 2 puffs into the lungs every 6 (six) hours as needed for wheezing or shortness of breath. Patient taking differently: Inhale 1 puff into the lungs every 6  (six) hours as needed for wheezing or shortness of breath.  03/26/18  Yes Ladell Pier, MD  carvedilol (COREG) 3.125 MG tablet Take 1 tablet (3.125 mg total) by mouth 2 (two) times daily with a meal. 11/21/18  Yes Ladell Pier, MD  gabapentin (NEURONTIN) 300 MG capsule Take 1 capsule (300 mg total) by mouth 3 (three) times daily. Increase to 4x/day as needed 02/04/19  Yes Gross, Remo Lipps, MD  traMADol (ULTRAM) 50 MG tablet Take 1-2 tablets (50-100 mg total) by mouth every 6 (six) hours as needed for moderate pain or severe pain. 01/30/19  Yes [name], MD     Family History  Problem Relation Age of Onset   Diabetes Father    Diabetes Sister    Hypertension Sister    Diabetes Brother    Hypertension Brother    Lung cancer Mother    Colon cancer Neg Hx    Stomach cancer Neg Hx    Pancreatic cancer Neg Hx    Esophageal cancer Neg Hx    Rectal cancer Neg Hx     Social History   Socioeconomic History   Marital status: Legally Separated    Spouse name: Not on file   Number of children: Not on file   Years of education: Not on file   Highest education level: Not on file  Occupational History   Not on file  Social Needs   Financial resource strain: Not on file   Food insecurity    Worry: Not on file    Inability: Not on file   Transportation needs    Medical: Not on file    Non-medical: Not on file  Tobacco Use   Smoking status: Current Every Day Smoker    Packs/day: 1.00    Years: 15.00    Pack years: 15.00    Types: Cigarettes   Smokeless tobacco: Former Network engineer and Sexual Activity   Alcohol use: Yes    Comment: occ   Drug use: No   Sexual activity: Yes    Birth control/protection: Surgical  Lifestyle   Physical activity    Days per week: Not on file    Minutes per session: Not on file   Stress: Not on file  Relationships   Social connections    Talks on phone: Not on file    Gets together: Not on file    Attends  religious service: Not on file    Active member of club or organization: Not on file    Attends meetings of clubs or organizations: Not on file    Relationship status: Not on file  Other Topics Concern   Not on file  Social History Narrative   ** Merged History Encounter **         Review of Systems: A 12 point ROS discussed and pertinent positives are indicated in the HPI above.  All other systems are negative.  Review of Systems  Constitutional: Negative for fatigue and fever.  Respiratory: Negative for  cough and shortness of breath.   Cardiovascular: Negative for chest pain.  Gastrointestinal: Positive for abdominal pain and vomiting. Negative for diarrhea and nausea.  Genitourinary: Positive for difficulty urinating.  Musculoskeletal: Negative for back pain.  Psychiatric/Behavioral: Negative for behavioral problems and confusion.    Vital Signs: BP 118/72 (BP Location: Left Arm)    Pulse 84    Temp 98.5 F (36.9 C) (Oral)    Resp 16    Ht 5' 2"  (1.575 m)    Wt 106 lb 11.2 oz (48.4 kg)    LMP 06/20/2012    SpO2 92%    BMI 19.52 kg/m   Physical Exam Vitals signs and nursing note reviewed.  Constitutional:      Appearance: Normal appearance.  HENT:     Mouth/Throat:     Mouth: Mucous membranes are moist.     Pharynx: Oropharynx is clear.  Cardiovascular:     Rate and Rhythm: Normal rate and regular rhythm.     Heart sounds: No murmur. No friction rub. No gallop.   Pulmonary:     Effort: Pulmonary effort is normal.     Breath sounds: Normal breath sounds.  Abdominal:     General: Abdomen is flat. There is no distension.     Palpations: Abdomen is soft.     Comments: Incisions from recent surgery intact.  Area of drainage in the LLQ at the site of prior drain placement.   Neurological:     Mental Status: She is alert.      MD Evaluation Airway: WNL Heart: WNL Abdomen: WNL Chest/ Lungs: WNL ASA  Classification: 3 Mallampati/Airway Score:  One   Imaging: Ct Abdomen Pelvis W Contrast  Result Date: 02/12/2019 CLINICAL DATA:  Recent partial colectomy for diverticulitis EXAM: CT ABDOMEN AND PELVIS WITH CONTRAST TECHNIQUE: Multidetector CT imaging of the abdomen and pelvis was performed using the standard protocol following bolus administration of intravenous contrast. Oral contrast was also administered. CONTRAST:  167m OMNIPAQUE IOHEXOL 300 MG/ML  SOLN COMPARISON:  April 12, 2018 FINDINGS: Lower chest: Lung bases are clear. Hepatobiliary: There is hepatic steatosis with somewhat greater fatty infiltration near the fissure for the ligamentum teres than elsewhere. No focal liver lesions are evident. The gallbladder wall is not appreciably thickened. There is no biliary duct dilatation. Pancreas: There is no pancreatic mass or inflammatory focus. Spleen: No splenic lesions are evident. Adrenals/Urinary Tract: Adrenals bilaterally appear normal. Kidneys bilaterally show no evident mass or hydronephrosis on either side. There is no appreciable renal or ureteral calculus on either side. The urinary bladder is midline with wall thickening, potentially due to surrounding inflammation in the pelvis. Stomach/Bowel: Postoperative changes noted in the pelvis with patent rectosigmoid region anastomosis. There is nearby fluid in the dependent portion of the pelvis with a fluid collection measuring 3.9 x 2.5 cm, likely of postoperative etiology. No air is seen and thin this fluid. There is evidence of wall thickening in the mid the distal sigmoid colon, likely secondary to diverticulitis. There is evidence of a focal abscess arising in the left pelvis from this but diverticulitis, measuring 3.7 x 2.9 cm. No perforation is seen in this area. Note that there is an apparent sinus tract extending between the area of diverticulitis in the sigmoid colon and the left rectus muscle in the pelvic region. No air is seen within this tract. Elsewhere, there is no frank  bowel obstruction. The terminal ileal region appears normal. No free air or portal venous air evident. Vascular/Lymphatic:  There is aortic atherosclerosis. No aneurysm evident. No adenopathy appreciable in the abdomen or pelvis. Reproductive: The periuterine region is surrounded with inflammation. Other: Appendix appears unremarkable. No ascites is evident in the abdomen or pelvis. Musculoskeletal: There are foci of degenerative change in the lumbar spine. There are no blastic or lytic bone lesions. No intramuscular lesions are evident apart apparent involvement of the anterior left rectus muscle with sinus tract from diverticulitis. IMPRESSION: 1. Sigmoid diverticulitis with extensive pelvic inflammation. There is a focal abscess arising in this area of diverticulitis measuring 3.7 x 2.9 cm. An apparent sinus tract is seen between the area of diverticulitis in the left rectus muscle without air or fluid in this tract or in the rectus muscle. There is thickening of the rectus muscle in this area focally on the left. No pneumoperitoneum evident. 2. Rectosigmoid junction anastomosis appears patent, although there is inflammatory change in this area. A nearby fluid collection measures 3.9 x 2.5 cm which does not contain air. Question postoperative seroma versus infected fluid. The attenuation value of this fluid is borderline high for purely serous fluid. 3.  No bowel obstruction.  Appendix region appears unremarkable. 4. Thickening of the urinary bladder wall may indicate intrinsic cystitis but likely is due to nearby inflammation from the diverticulitis and diverticular abscess. 5.   Hepatic steatosis. These results were called by telephone at the time of interpretation on 02/12/2019 at 4:04 pm to [facility], [facility] , who verbally acknowledged these results. Electronically Signed   By: Lowella Grip III M.D.   On: 02/12/2019 16:05    Labs:  CBC: Recent Labs    02/03/19 0834 02/04/19 0317 02/12/19 2013  02/13/19 0451  WBC 15.5* 10.1 16.6* 12.8*  HGB 10.8* 9.4* 11.1* 10.6*  HCT 33.2* 30.1* 34.6* 33.4*  PLT 284 301 738* 666*    COAGS: Recent Labs    03/10/18 0620 04/03/18 1258 02/13/19 1006  INR 0.96 0.87 1.0  APTT 26 24  --     BMP: Recent Labs    01/31/19 0326 02/03/19 0834 02/12/19 2013 02/13/19 0451  NA 133* 134* 128* 132*  K 3.9 3.7 3.6 4.3  CL 99 92* 90* 94*  CO2 27 28 25 28   GLUCOSE 128* 102* 94 93  BUN 6 8 7  5*  CALCIUM 7.4* 8.5* 8.5* 8.4*  CREATININE 0.59 0.49 0.52 0.57  GFRNONAA >60 >60 >60 >60  GFRAA >60 >60 >60 >60    LIVER FUNCTION TESTS: Recent Labs    03/09/18 1315 03/26/18 1026 03/29/18 1140 02/12/19 2013  BILITOT 0.7 0.3 0.3 0.7  AST 25 24 15 24   ALT 17 20 17 12   ALKPHOS 45 49 41 80  PROT 6.5 6.7 7.2 7.0  ALBUMIN 3.1* 4.1 4.0 3.0*    TUMOR MARKERS: No results for input(s): AFPTM, CEA, CA199, CHROMGRNA in the last 8760 hours.  Assessment and Plan: Recurrent abdominal abscess Colocutaneous fistula Patient with intra-abdominal fluid collection.   Request made for new drain placement.  Discussed with patient who is understandably upset that she will need another drain. Discussed risks and benefits.  She has been NPO.  INR 1.0  Risks and benefits discussed with the patient including bleeding, infection, damage to adjacent structures, bowel perforation/fistula connection, and sepsis.  All of the patient's questions were answered, patient is agreeable to proceed. Consent signed and in chart.   Thank you for this interesting consult.  I greatly enjoyed meeting [facility] and look forward to participating in their  care.  A copy of this report was sent to the requesting provider on this date.  Electronically Signed: Docia Barrier, PA 02/13/2019, 12:32 PM   I spent a total of    25 Minutes in face to face in clinical consultation, greater than 50% of which was counseling/coordinating care for intra-abdominal fluid  collection.

## 2019-02-13 NOTE — Progress Notes (Addendum)
Christina Miller [id number] Oct 09, 1965  CARE TEAM:  PCP: Ladell Pier, MD  Outpatient Care Team: Patient Care Team: Ladell Pier, MD as PCP - General (Internal Medicine) [name], MD as Consulting Physician (General Surgery) [name], MD as Consulting Physician (Gastroenterology)  Inpatient Treatment Team: Treatment Team: Attending Provider: [name], MD; Registered Nurse: Cummings, [place] W, RN   Problem List:   Principal Problem:   Intra-abdominal abscess [facility]) Active Problems:   Diverticulitis of large intestine with perforation and abscess   Financial difficulties   Anxiety state   COPD (chronic obstructive pulmonary disease) ([place])   Tobacco dependence   Essential hypertension   Multiple sclerosis ([place])      01/30/2019  POST-OPERATIVE DIAGNOSIS:  PELVIC ABSCESS AND FISTULA FROM PERFORATED DIVERTICULITIS  PROCEDURE:  XI ROBOT ASSISTED LOW ANTERIOR RESECTION DRAINAGE OF ABSCESS OF ANTERIOR PELVIS & ABDOMINAL WALL MOBILIZATION OF SPLENIC FLEXURE OF COLON LEFT SALPINGO-OOPHORECTOMY ASSESSMENT OF TISSUE PERFUSION WITH FIRELFY IMMUNOFLUORESENCE BILATERAL TAP BLOCK RIGID PROCTOSCOPY  SURGEON:  Adin Hector, MD    Assessment  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  Zosyn.  Add fluconazole  -perc drainage by IR - try to find a different window from the prior LLQ location.  Recheck C&S to r/o resistant or atypical organism  -pain control  -anxiolysis  -HTN control - coreg  -nausea control  VTE proph - SCDs & ambulation for now  RAD - alb PRN    -STOP SMOKING! We talked to the patient about the dangers of smoking.  We stressed that tobacco use dramatically increases the risk of peri-operative complications such as infection, tissue necrosis leaving to problems with incision/wound and organ healing, hernia, chronic pain, heart attack,  stroke, DVT, pulmonary embolism, and death.  We noted there are programs in our community to help stop smoking.  Information was available.  D/C patient from hospital when patient meets criteria (anticipate in 1-3 day(s)):  Tolerating oral intake well Ambulating well Adequate pain control without IV medications Urinating  Having flatus Disposition planning in place   I updated the patient's status to the patient.  Recommendations were made.  Questions were answered.  She expressed understanding & appreciation.   25 minutes spent in review, evaluation, examination, counseling, and coordination of care.  More than 50% of that time was spent in counseling.  02/13/2019    Subjective: (Chief complaint)  Crying Anxious C/o LLQ pain No more nausea  Objective:  Vital signs:  Vitals:   02/12/19 1949 02/12/19 1950 02/13/19 0201 02/13/19 0538  BP: (!) 156/91  (!) 141/98 124/79  Pulse: (!) 107  96 85  Resp: 16  16 16   Temp: 98.6 F (37 C)  98.6 F (37 C) 98.8 F (37.1 C)  SpO2: 94%  94% 92%  Weight:  48.4 kg    Height:  5' 2"  (1.575 m)      Last BM Date: 02/12/19  Intake/Output   Yesterday:  07/28 0701 - 07/29 0700 In: 1094.6 [P.O.:60; I.V.:884.6; IV Piggyback:150] Out: -  This shift:  Total I/O In: 1094.6 [P.O.:60; I.V.:884.6; IV Piggyback:150] Out: -   Bowel function:  Flatus: No  BM:  No  Drain: Serosanguinous   Physical Exam:  General: Pt awake/alert/oriented x4 in mild acute distress Eyes: PERRL, normal EOM.  Sclera clear.  No icterus Neuro: CN II-XII intact w/o focal sensory/motor deficits. Lymph: No head/neck/groin lymphadenopathy  Psych:  No delerium/psychosis/paranoia HENT: Normocephalic, Mucus membranes moist.  No thrush Neck: Supple, No tracheal deviation Chest: No chest wall pain w good excursion CV:  Pulses intact.  Regular rhythm MS: Normal AROM mjr joints.  No obvious deformity  Abdomen: Soft.  Mildy distended.  Tenderness at LLQ only.   Drain site closed w/o cellulitis.  No evidence of peritonitis.  No incarcerated hernias.  Ext:  No deformity.  No mjr edema.  No cyanosis Skin: No petechiae / purpura  Results:   Cultures: Recent Results (from the past 720 hour(s))  SARS Coronavirus 2 (Performed in [place] hospital lab)     Status: None   Collection Time: 01/26/19  4:58 PM   Specimen: Nasal Swab  Result Value Ref Range Status   SARS Coronavirus 2 NEGATIVE NEGATIVE Final    Comment: (NOTE) SARS-CoV-2 target nucleic acids are NOT DETECTED. The SARS-CoV-2 RNA is generally detectable in upper and lower respiratory specimens during the acute phase of infection. Negative results do not preclude SARS-CoV-2 infection, do not rule out co-infections with other pathogens, and should not be used as the sole basis for treatment or other patient management decisions. Negative results must be combined with clinical observations, patient history, and epidemiological information. The expected result is Negative. Fact Sheet for Patients: SugarRoll.be Fact Sheet for Healthcare Providers: https://www.woods-mathews.com/ This test is not yet approved or cleared by the [place] FDA and  has been authorized for detection and/or diagnosis of SARS-CoV-2 by FDA under an Emergency Use Authorization (EUA). This EUA will remain  in effect (meaning this test can be used) for the duration of the COVID-19 declaration under Section 56 4(b)(1) of the Act, 21 U.S.C. section 360bbb-3(b)(1), unless the authorization is terminated or revoked sooner. Performed at [facility], [place] [address], [place], [place] [address]     Labs: Results for orders placed or performed during the hospital encounter of 02/12/19 (from the past 48 hour(s))  CBC WITH DIFFERENTIAL     Status: Abnormal   Collection Time: 02/12/19  8:13 PM  Result Value Ref Range   WBC 16.6 (H) 4.0 - 10.5 K/uL   RBC 3.30 (L) 3.87  - 5.11 MIL/uL   Hemoglobin 11.1 (L) 12.0 - 15.0 g/dL   HCT 34.6 (L) 36.0 - 46.0 %   MCV 104.8 (H) 80.0 - 100.0 fL   MCH 33.6 26.0 - 34.0 pg   MCHC 32.1 30.0 - 36.0 g/dL   RDW 13.2 11.5 - 15.5 %   Platelets 738 (H) 150 - 400 K/uL   nRBC 0.0 0.0 - 0.2 %   Neutrophils Relative % 73 %   Neutro Abs 12.1 (H) 1.7 - 7.7 K/uL   Lymphocytes Relative 16 %   Lymphs Abs 2.7 0.7 - 4.0 K/uL   Monocytes Relative 8 %   Monocytes Absolute 1.4 (H) 0.1 - 1.0 K/uL   Eosinophils Relative 1 %   Eosinophils Absolute 0.2 0.0 - 0.5 K/uL   Basophils Relative 1 %   Basophils Absolute 0.1 0.0 - 0.1 K/uL   Immature Granulocytes 1 %   Abs Immature Granulocytes 0.08 (H) 0.00 - 0.07 K/uL    Comment: Performed at [facility], [place] [address], [place], [place] [address]  Comprehensive metabolic panel     Status: Abnormal   Collection Time: 02/12/19  8:13 PM  Result Value Ref Range   Sodium 128 (L) 135 - 145 mmol/L   Potassium 3.6 3.5 - 5.1 mmol/L  Chloride 90 (L) 98 - 111 mmol/L   CO2 25 22 - 32 mmol/L   Glucose, Bld 94 70 - 99 mg/dL   BUN 7 6 - 20 mg/dL   Creatinine, Ser 0.52 0.44 - 1.00 mg/dL   Calcium 8.5 (L) 8.9 - 10.3 mg/dL   Total Protein 7.0 6.5 - 8.1 g/dL   Albumin 3.0 (L) 3.5 - 5.0 g/dL   AST 24 15 - 41 U/L   ALT 12 0 - 44 U/L   Alkaline Phosphatase 80 38 - 126 U/L   Total Bilirubin 0.7 0.3 - 1.2 mg/dL   GFR calc non Af Amer >60 >60 mL/min   GFR calc Af Amer >60 >60 mL/min   Anion gap 13 5 - 15    Comment: Performed at [facility], [place] [address] [place], [place] [address]  Basic metabolic panel     Status: Abnormal   Collection Time: 02/13/19  4:51 AM  Result Value Ref Range   Sodium 132 (L) 135 - 145 mmol/L   Potassium 4.3 3.5 - 5.1 mmol/L   Chloride 94 (L) 98 - 111 mmol/L   CO2 28 22 - 32 mmol/L   Glucose, Bld 93 70 - 99 mg/dL   BUN 5 (L) 6 - 20 mg/dL   Creatinine, Ser 0.57 0.44 - 1.00 mg/dL   Calcium 8.4 (L) 8.9 - 10.3 mg/dL   GFR calc non Af  Amer >60 >60 mL/min   GFR calc Af Amer >60 >60 mL/min   Anion gap 10 5 - 15    Comment: Performed at [facility], [place] [address] [place], [place] [address]  CBC     Status: Abnormal   Collection Time: 02/13/19  4:51 AM  Result Value Ref Range   WBC 12.8 (H) 4.0 - 10.5 K/uL   RBC 3.20 (L) 3.87 - 5.11 MIL/uL   Hemoglobin 10.6 (L) 12.0 - 15.0 g/dL   HCT 33.4 (L) 36.0 - 46.0 %   MCV 104.4 (H) 80.0 - 100.0 fL   MCH 33.1 26.0 - 34.0 pg   MCHC 31.7 30.0 - 36.0 g/dL   RDW 13.2 11.5 - 15.5 %   Platelets 666 (H) 150 - 400 K/uL   nRBC 0.0 0.0 - 0.2 %    Comment: Performed at [facility], [place] [address] [place], [place] [address]    Imaging / Studies: Ct Abdomen Pelvis W Contrast  Result Date: 02/12/2019 CLINICAL DATA:  Recent partial colectomy for diverticulitis EXAM: CT ABDOMEN AND PELVIS WITH CONTRAST TECHNIQUE: Multidetector CT imaging of the abdomen and pelvis was performed using the standard protocol following bolus administration of intravenous contrast. Oral contrast was also administered. CONTRAST:  136m OMNIPAQUE IOHEXOL 300 MG/ML  SOLN COMPARISON:  April 12, 2018 FINDINGS: Lower chest: Lung bases are clear. Hepatobiliary: There is hepatic steatosis with somewhat greater fatty infiltration near the fissure for the ligamentum teres than elsewhere. No focal liver lesions are evident. The gallbladder wall is not appreciably thickened. There is no biliary duct dilatation. Pancreas: There is no pancreatic mass or inflammatory focus. Spleen: No splenic lesions are evident. Adrenals/Urinary Tract: Adrenals bilaterally appear normal. Kidneys bilaterally show no evident mass or hydronephrosis on either side. There is no appreciable renal or ureteral calculus on either side. The urinary bladder is midline with wall thickening, potentially due to surrounding inflammation in the pelvis. Stomach/Bowel: Postoperative changes noted in the pelvis with patent  rectosigmoid region anastomosis. There is nearby fluid in  the dependent portion of the pelvis with a fluid collection measuring 3.9 x 2.5 cm, likely of postoperative etiology. No air is seen and thin this fluid. There is evidence of wall thickening in the mid the distal sigmoid colon, likely secondary to diverticulitis. There is evidence of a focal abscess arising in the left pelvis from this but diverticulitis, measuring 3.7 x 2.9 cm. No perforation is seen in this area. Note that there is an apparent sinus tract extending between the area of diverticulitis in the sigmoid colon and the left rectus muscle in the pelvic region. No air is seen within this tract. Elsewhere, there is no frank bowel obstruction. The terminal ileal region appears normal. No free air or portal venous air evident. Vascular/Lymphatic: There is aortic atherosclerosis. No aneurysm evident. No adenopathy appreciable in the abdomen or pelvis. Reproductive: The periuterine region is surrounded with inflammation. Other: Appendix appears unremarkable. No ascites is evident in the abdomen or pelvis. Musculoskeletal: There are foci of degenerative change in the lumbar spine. There are no blastic or lytic bone lesions. No intramuscular lesions are evident apart apparent involvement of the anterior left rectus muscle with sinus tract from diverticulitis. IMPRESSION: 1. Sigmoid diverticulitis with extensive pelvic inflammation. There is a focal abscess arising in this area of diverticulitis measuring 3.7 x 2.9 cm. An apparent sinus tract is seen between the area of diverticulitis in the left rectus muscle without air or fluid in this tract or in the rectus muscle. There is thickening of the rectus muscle in this area focally on the left. No pneumoperitoneum evident. 2. Rectosigmoid junction anastomosis appears patent, although there is inflammatory change in this area. A nearby fluid collection measures 3.9 x 2.5 cm which does not contain air. Question  postoperative seroma versus infected fluid. The attenuation value of this fluid is borderline high for purely serous fluid. 3.  No bowel obstruction.  Appendix region appears unremarkable. 4. Thickening of the urinary bladder wall may indicate intrinsic cystitis but likely is due to nearby inflammation from the diverticulitis and diverticular abscess. 5.   Hepatic steatosis. These results were called by telephone at the time of interpretation on 02/12/2019 at 4:04 pm to [facility] , who verbally acknowledged these results. Electronically Signed   By: Lowella Grip III M.D.   On: 02/12/2019 16:05    Medications / Allergies: per chart  Antibiotics: Anti-infectives (From admission, onward)   Start     Dose/Rate Route Frequency Ordered Stop   02/12/19 2200  piperacillin-tazobactam (ZOSYN) IVPB 3.375 g     3.375 g 12.5 mL/hr over 240 Minutes Intravenous Every 8 hours 02/12/19 2002          Note: Portions of this report may have been transcribed using voice recognition software. Every effort was made to ensure accuracy; however, inadvertent computerized transcription errors may be present.   Any transcriptional errors that result from this process are unintentional.     Adin Hector, MD, FACS, MASCRS Gastrointestinal and Minimally Invasive Surgery    [address], [place], [place] [address] [phone] Main / Paging [phone] Fax

## 2019-02-14 MED ORDER — ACETAMINOPHEN 500 MG PO TABS
1000.0000 mg | ORAL_TABLET | Freq: Three times a day (TID) | ORAL | Status: DC
  Administered 2019-02-14 – 2019-02-15 (×4): 1000 mg via ORAL
  Filled 2019-02-14 (×4): qty 2

## 2019-02-14 MED ORDER — SODIUM CHLORIDE 0.9% FLUSH
3.0000 mL | Freq: Two times a day (BID) | INTRAVENOUS | Status: DC
  Administered 2019-02-14: 3 mL via INTRAVENOUS

## 2019-02-14 MED ORDER — SODIUM CHLORIDE 0.9 % IV SOLN: 250.0000 mL | INTRAVENOUS | Status: DC | PRN

## 2019-02-14 MED ORDER — LACTATED RINGERS IV BOLUS: 1000.0000 mL | Freq: Three times a day (TID) | INTRAVENOUS | Status: DC | PRN

## 2019-02-14 MED ORDER — SODIUM CHLORIDE 0.9% FLUSH: 3.0000 mL | INTRAVENOUS | Status: DC | PRN

## 2019-02-14 NOTE — Progress Notes (Signed)
Referring Physician(s): Gross,S  Supervising Physician: Arne Cleveland  Patient Status:  [facility] - In-pt  Chief Complaint:  Abdominal pain  Subjective: Pt still has some LLQ discomfort; denies N/V   Allergies: Hydrocodone  Medications: Prior to Admission medications   Medication Sig Start Date End Date Taking? Authorizing Provider  albuterol (PROVENTIL HFA;VENTOLIN HFA) 108 (90 Base) MCG/ACT inhaler Inhale 2 puffs into the lungs every 6 (six) hours as needed for wheezing or shortness of breath. Patient taking differently: Inhale 1 puff into the lungs every 6 (six) hours as needed for wheezing or shortness of breath.  03/26/18  Yes Ladell Pier, MD  carvedilol (COREG) 3.125 MG tablet Take 1 tablet (3.125 mg total) by mouth 2 (two) times daily with a meal. 11/21/18  Yes Ladell Pier, MD  gabapentin (NEURONTIN) 300 MG capsule Take 1 capsule (300 mg total) by mouth 3 (three) times daily. Increase to 4x/day as needed 02/04/19  Yes Gross, Remo Lipps, MD  traMADol (ULTRAM) 50 MG tablet Take 1-2 tablets (50-100 mg total) by mouth every 6 (six) hours as needed for moderate pain or severe pain. 01/30/19  Yes [name], MD     Vital Signs: BP (!) 134/92 (BP Location: Left Arm)    Pulse 90    Temp 98.3 F (36.8 C) (Oral)    Resp 18    Ht 5' 2"  (1.575 m)    Wt 106 lb 11.2 oz (48.4 kg)    LMP 06/20/2012    SpO2 93%    BMI 19.52 kg/m   Physical Exam awake/answering questions appropriately; LLQ drain intact, insertion site ok, mildly tender, output 80 cc blood- tinged fluid yesterday, 5-10 cc today; drain flushed without difficulty  Imaging: Ct Abdomen Pelvis W Contrast  Result Date: 02/12/2019 CLINICAL DATA:  Recent partial colectomy for diverticulitis EXAM: CT ABDOMEN AND PELVIS WITH CONTRAST TECHNIQUE: Multidetector CT imaging of the abdomen and pelvis was performed using the standard protocol following bolus administration of intravenous contrast. Oral contrast was also  administered. CONTRAST:  155m OMNIPAQUE IOHEXOL 300 MG/ML  SOLN COMPARISON:  April 12, 2018 FINDINGS: Lower chest: Lung bases are clear. Hepatobiliary: There is hepatic steatosis with somewhat greater fatty infiltration near the fissure for the ligamentum teres than elsewhere. No focal liver lesions are evident. The gallbladder wall is not appreciably thickened. There is no biliary duct dilatation. Pancreas: There is no pancreatic mass or inflammatory focus. Spleen: No splenic lesions are evident. Adrenals/Urinary Tract: Adrenals bilaterally appear normal. Kidneys bilaterally show no evident mass or hydronephrosis on either side. There is no appreciable renal or ureteral calculus on either side. The urinary bladder is midline with wall thickening, potentially due to surrounding inflammation in the pelvis. Stomach/Bowel: Postoperative changes noted in the pelvis with patent rectosigmoid region anastomosis. There is nearby fluid in the dependent portion of the pelvis with a fluid collection measuring 3.9 x 2.5 cm, likely of postoperative etiology. No air is seen and thin this fluid. There is evidence of wall thickening in the mid the distal sigmoid colon, likely secondary to diverticulitis. There is evidence of a focal abscess arising in the left pelvis from this but diverticulitis, measuring 3.7 x 2.9 cm. No perforation is seen in this area. Note that there is an apparent sinus tract extending between the area of diverticulitis in the sigmoid colon and the left rectus muscle in the pelvic region. No air is seen within this tract. Elsewhere, there is no frank bowel obstruction. The terminal ileal region  appears normal. No free air or portal venous air evident. Vascular/Lymphatic: There is aortic atherosclerosis. No aneurysm evident. No adenopathy appreciable in the abdomen or pelvis. Reproductive: The periuterine region is surrounded with inflammation. Other: Appendix appears unremarkable. No ascites is evident  in the abdomen or pelvis. Musculoskeletal: There are foci of degenerative change in the lumbar spine. There are no blastic or lytic bone lesions. No intramuscular lesions are evident apart apparent involvement of the anterior left rectus muscle with sinus tract from diverticulitis. IMPRESSION: 1. Sigmoid diverticulitis with extensive pelvic inflammation. There is a focal abscess arising in this area of diverticulitis measuring 3.7 x 2.9 cm. An apparent sinus tract is seen between the area of diverticulitis in the left rectus muscle without air or fluid in this tract or in the rectus muscle. There is thickening of the rectus muscle in this area focally on the left. No pneumoperitoneum evident. 2. Rectosigmoid junction anastomosis appears patent, although there is inflammatory change in this area. A nearby fluid collection measures 3.9 x 2.5 cm which does not contain air. Question postoperative seroma versus infected fluid. The attenuation value of this fluid is borderline high for purely serous fluid. 3.  No bowel obstruction.  Appendix region appears unremarkable. 4. Thickening of the urinary bladder wall may indicate intrinsic cystitis but likely is due to nearby inflammation from the diverticulitis and diverticular abscess. 5.   Hepatic steatosis. These results were called by telephone at the time of interpretation on 02/12/2019 at 4:04 pm to [facility] , who verbally acknowledged these results. Electronically Signed   By: Lowella Grip III M.D.   On: 02/12/2019 16:05   Ct Image Guided Drainage By Percutaneous Catheter  Result Date: 02/13/2019 INDICATION: 53 year old female with postoperative abscess in the left lower quadrant status post recent sigmoidectomy with primary repair and left salpingo oophorectomy. EXAM: CT drain MEDICATIONS: The patient is currently admitted to the hospital and receiving intravenous antibiotics. The antibiotics were administered within an appropriate time frame prior to the  initiation of the procedure. ANESTHESIA/SEDATION: Fentanyl 100 mcg IV; Versed 2 mg IV Moderate Sedation Time:  10 minutes The patient was continuously monitored during the procedure by the interventional radiology nurse under my direct supervision. COMPLICATIONS: None immediate. PROCEDURE: Informed written consent was obtained from the patient after a thorough discussion of the procedural risks, benefits and alternatives. All questions were addressed. Maximal Sterile Barrier Technique was utilized including caps, mask, sterile gowns, sterile gloves, sterile drape, hand hygiene and skin antiseptic. A timeout was performed prior to the initiation of the procedure. A planning axial CT scan was performed. The left lower quadrant supra fascicular abscess collection was successfully identified. A suitable skin entry site was selected and marked. Care was taken to remain at medial to the inferior epigastric vessels. Local anesthesia was attained by infiltration with 1% lidocaine. A small dermatotomy was made. Under intermittent CT guidance, an 18 gauge trocar needle was advanced into the fluid collection. A 0.035 wire was then advanced into the fluid collection. The trocar needle was removed. The skin tract was dilated to 45 [place] and a 12 [place] all-purpose drainage catheter was advanced over the wire and formed in the fluid collection. Aspiration yields approximately 15 mL frankly purulent material. A sample was sent for culture. Follow-up CT imaging demonstrates a well-positioned drainage catheter with significant improvement in the fluid and gas collection. The catheter was secured to the skin with 0 Prolene suture and a sterile bandage. IMPRESSION: 1. Successful placement of  a 42 French percutaneous drain with aspiration of 15 mL frankly purulent fluid. Samples were sent for culture. PLAN: 1. Cultures are pending. 2. Maintain drain to JP bulb suction. 3. Flush drain at least once per shift. 4. Recommend drain  injection prior to consideration of drain removal. Signed, Criselda Peaches, MD, RPVI [facility] Electronically Signed   By: Jacqulynn Cadet M.D.   On: 02/13/2019 18:32    Labs:  CBC: Recent Labs    02/03/19 0834 02/04/19 0317 02/12/19 2013 02/13/19 0451  WBC 15.5* 10.1 16.6* 12.8*  HGB 10.8* 9.4* 11.1* 10.6*  HCT 33.2* 30.1* 34.6* 33.4*  PLT 284 301 738* 666*    COAGS: Recent Labs    03/10/18 0620 04/03/18 1258 02/13/19 1006  INR 0.96 0.87 1.0  APTT 26 24  --     BMP: Recent Labs    01/31/19 0326 02/03/19 0834 02/12/19 2013 02/13/19 0451  NA 133* 134* 128* 132*  K 3.9 3.7 3.6 4.3  CL 99 92* 90* 94*  CO2 27 28 25 28   GLUCOSE 128* 102* 94 93  BUN 6 8 7  5*  CALCIUM 7.4* 8.5* 8.5* 8.4*  CREATININE 0.59 0.49 0.52 0.57  GFRNONAA >60 >60 >60 >60  GFRAA >60 >60 >60 >60    LIVER FUNCTION TESTS: Recent Labs    03/09/18 1315 03/26/18 1026 03/29/18 1140 02/12/19 2013  BILITOT 0.7 0.3 0.3 0.7  AST 25 24 15 24   ALT 17 20 17 12   ALKPHOS 45 49 41 80  PROT 6.5 6.7 7.2 7.0  ALBUMIN 3.1* 4.1 4.0 3.0*    Assessment and Plan: Pt with hx perf diverticulitis/abscess, s/p LAR/abscess drainage/splenic flexure mobilization, left SOO 7/15; now with LLQ post op abscess, s/p drain 7/29; afebrile; fluid cx pend; cont drain irrigation/output monitoring; no new labs today; check f/u CT once drain output minimal or if WBC increases; OOB   Electronically Signed: D. Rowe Robert, PA-C 02/14/2019, 11:09 AM   I spent a total of 15 minutes at the the patient's bedside AND on the patient's hospital floor or unit, greater than 50% of which was counseling/coordinating care for left pelvic abscess drain    Patient ID: Christina Miller, female   DOB: 05/13/1966, 53 y.o.   MRN: [id number]

## 2019-02-14 NOTE — Progress Notes (Signed)
Christina Miller [id number] 20-Sep-1965  CARE TEAM:  PCP: Ladell Pier, MD  Outpatient Care Team: Patient Care Team: Ladell Pier, MD as PCP - General (Internal Medicine) [name], MD as Consulting Physician (General Surgery) [name], MD as Consulting Physician (Gastroenterology)  Inpatient Treatment Team: Treatment Team: Attending Provider: [name], MD; Rounding Team: D[facility]er Radiology, MD; Technician: Leda Quail, NT; Registered Nurse: Guadlupe Spanish, RN; Utilization Review: Miringu, Mikael Spray, RN   Problem List:   Principal Problem:   Intra-abdominal abscess [facility]) Active Problems:   Diverticulitis of large intestine with perforation and abscess   Financial difficulties   Anxiety state   COPD (chronic obstructive pulmonary disease) ([place])   Tobacco dependence   Essential hypertension   Multiple sclerosis ([place])   Colonic diverticular abscess      01/30/2019  POST-OPERATIVE DIAGNOSIS:  PELVIC ABSCESS AND FISTULA FROM PERFORATED DIVERTICULITIS  PROCEDURE:  XI ROBOT ASSISTED LOW ANTERIOR RESECTION DRAINAGE OF ABSCESS OF ANTERIOR PELVIS & ABDOMINAL WALL MOBILIZATION OF SPLENIC FLEXURE OF COLON LEFT SALPINGO-OOPHORECTOMY ASSESSMENT OF TISSUE PERFUSION WITH FIRELFY IMMUNOFLUORESENCE BILATERAL TAP BLOCK RIGID PROCTOSCOPY  SURGEON:  Adin Hector, MD    Assessment  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  Zosyn.  Add fluconazole  -perc drainage by IR done.  Purulence on initial aspiration.  Seems more like old hematoma now.  Recheck C&S to r/o resistant or atypical organism  -pain control  -anxiolysis.  Rather tearful.  Try to console.  Ativan seemed to help a little bit.  -HTN control - coreg  -nausea control  VTE proph - SCDs & ambulation for now  RAD - alb PRN    -STOP SMOKING! We talked to the patient about  the dangers of smoking.  We stressed that tobacco use dramatically increases the risk of peri-operative complications such as infection, tissue necrosis leaving to problems with incision/wound and organ healing, hernia, chronic pain, heart attack, stroke, DVT, pulmonary embolism, and death.  We noted there are programs in our community to help stop smoking.  Information was available.  D/C patient from hospital when patient meets criteria (anticipate in 1-3 day(s)):  Tolerating oral intake well Ambulating well Adequate pain control without IV medications Urinating  Having flatus Disposition planning in place   I updated the patient's status to the patient.  Recommendations were made.  Questions were answered.  She was tearful but expressed understanding & appreciation.   25 minutes spent in review, evaluation, examination, counseling, and coordination of care.  More than 50% of that time was spent in counseling.  02/14/2019    Subjective: (Chief complaint)  Drain placed.  Nervous about how long the drain will need to stay.  Some soreness and nausea but not severe.  Staying in bed.  Objective:  Vital signs:  Vitals:   02/13/19 1525 02/13/19 2107 02/14/19 0131 02/14/19 0451  BP: 94/73 120/85 115/85 (!) 134/92  Pulse: 83 90 (!) 105 90  Resp: 10 16  18   Temp:  99.5 F (37.5 C)  98.3 F (36.8 C)  TempSrc:  Oral  Oral  SpO2: 98% 92% 93% 93%  Weight:      Height:        Last BM Date: 02/12/19  Intake/Output   Yesterday:  07/29 0701 - 07/30 0700 In: 55 [I.V.:5; IV Piggyback:50] Out: 80 [Drains:80] This shift:  No intake/output data recorded.  Bowel  function:  Flatus: YES  BM:  No  Drain: Brownish-red in perc drain.  Not Feculent.  Not frankly purulent   Physical Exam:  General: Pt awake/alert/oriented x4 in mild acute distress Eyes: PERRL, normal EOM.  Sclera clear.  No icterus Neuro: CN II-XII intact w/o focal sensory/motor deficits. Lymph: No  head/neck/groin lymphadenopathy Psych:  No delerium/psychosis/paranoia HENT: Normocephalic, Mucus membranes moist.  No thrush Neck: Supple, No tracheal deviation Chest: No chest wall pain w good excursion CV:  Pulses intact.  Regular rhythm MS: Normal AROM mjr joints.  No obvious deformity  Abdomen: Soft.  Nondistended.  Tenderness at LLQ only.  Drain site .  No evidence of peritonitis.  No incarcerated hernias.  Ext:  No deformity.  No mjr edema.  No cyanosis Skin: No petechiae / purpura  Results:   Cultures: Recent Results (from the past 720 hour(s))  SARS Coronavirus 2 (Performed in [place] hospital lab)     Status: None   Collection Time: 01/26/19  4:58 PM   Specimen: Nasal Swab  Result Value Ref Range Status   SARS Coronavirus 2 NEGATIVE NEGATIVE Final    Comment: (NOTE) SARS-CoV-2 target nucleic acids are NOT DETECTED. The SARS-CoV-2 RNA is generally detectable in upper and lower respiratory specimens during the acute phase of infection. Negative results do not preclude SARS-CoV-2 infection, do not rule out co-infections with other pathogens, and should not be used as the sole basis for treatment or other patient management decisions. Negative results must be combined with clinical observations, patient history, and epidemiological information. The expected result is Negative. Fact Sheet for Patients: SugarRoll.be Fact Sheet for Healthcare Providers: https://www.woods-mathews.com/ This test is not yet approved or cleared by the [place] FDA and  has been authorized for detection and/or diagnosis of SARS-CoV-2 by FDA under an Emergency Use Authorization (EUA). This EUA will remain  in effect (meaning this test can be used) for the duration of the COVID-19 declaration under Section 56 4(b)(1) of the Act, 21 U.S.C. section 360bbb-3(b)(1), unless the authorization is terminated or revoked sooner. Performed at [facility], [place] [address] [place], [place] [address]   Aerobic/Anaerobic Culture (surgical/deep wound)     Status: None (Preliminary result)   Collection Time: 02/13/19  3:38 PM   Specimen: Abscess  Result Value Ref Range Status   Specimen Description   Final    ABSCESS ABDOMEN Performed at [place] [address] [place], [place] [address]    Special Requests NONE  Final   Gram Stain   Final    ABUNDANT WBC PRESENT, PREDOMINANTLY PMN ABUNDANT GRAM POSITIVE COCCI ABUNDANT GRAM NEGATIVE RODS MODERATE GRAM POSITIVE RODS Performed at [facility], [place] [address] [place], [place] [address]    Culture PENDING  Incomplete   Report Status PENDING  Incomplete    Labs: Results for orders placed or performed during the hospital encounter of 02/12/19 (from the past 48 hour(s))  CBC WITH DIFFERENTIAL     Status: Abnormal   Collection Time: 02/12/19  8:13 PM  Result Value Ref Range   WBC 16.6 (H) 4.0 - 10.5 K/uL   RBC 3.30 (L) 3.87 - 5.11 MIL/uL   Hemoglobin 11.1 (L) 12.0 - 15.0 g/dL   HCT 34.6 (L) 36.0 - 46.0 %   MCV 104.8 (H) 80.0 - 100.0 fL   MCH 33.6 26.0 - 34.0 pg   MCHC 32.1 30.0 - 36.0 g/dL   RDW 13.2 11.5 - 15.5 %  Platelets 738 (H) 150 - 400 K/uL   nRBC 0.0 0.0 - 0.2 %   Neutrophils Relative % 73 %   Neutro Abs 12.1 (H) 1.7 - 7.7 K/uL   Lymphocytes Relative 16 %   Lymphs Abs 2.7 0.7 - 4.0 K/uL   Monocytes Relative 8 %   Monocytes Absolute 1.4 (H) 0.1 - 1.0 K/uL   Eosinophils Relative 1 %   Eosinophils Absolute 0.2 0.0 - 0.5 K/uL   Basophils Relative 1 %   Basophils Absolute 0.1 0.0 - 0.1 K/uL   Immature Granulocytes 1 %   Abs Immature Granulocytes 0.08 (H) 0.00 - 0.07 K/uL    Comment: Performed at [facility], [place] [address], [place], [place] [address]  Comprehensive metabolic panel     Status: Abnormal   Collection Time: 02/12/19  8:13 PM  Result Value Ref Range   Sodium 128 (L) 135 - 145 mmol/L   Potassium 3.6  3.5 - 5.1 mmol/L   Chloride 90 (L) 98 - 111 mmol/L   CO2 25 22 - 32 mmol/L   Glucose, Bld 94 70 - 99 mg/dL   BUN 7 6 - 20 mg/dL   Creatinine, Ser 0.52 0.44 - 1.00 mg/dL   Calcium 8.5 (L) 8.9 - 10.3 mg/dL   Total Protein 7.0 6.5 - 8.1 g/dL   Albumin 3.0 (L) 3.5 - 5.0 g/dL   AST 24 15 - 41 U/L   ALT 12 0 - 44 U/L   Alkaline Phosphatase 80 38 - 126 U/L   Total Bilirubin 0.7 0.3 - 1.2 mg/dL   GFR calc non Af Amer >60 >60 mL/min   GFR calc Af Amer >60 >60 mL/min   Anion gap 13 5 - 15    Comment: Performed at [facility], [place] [address], [place], [place] [address]  Basic metabolic panel     Status: Abnormal   Collection Time: 02/13/19  4:51 AM  Result Value Ref Range   Sodium 132 (L) 135 - 145 mmol/L   Potassium 4.3 3.5 - 5.1 mmol/L   Chloride 94 (L) 98 - 111 mmol/L   CO2 28 22 - 32 mmol/L   Glucose, Bld 93 70 - 99 mg/dL   BUN 5 (L) 6 - 20 mg/dL   Creatinine, Ser 0.57 0.44 - 1.00 mg/dL   Calcium 8.4 (L) 8.9 - 10.3 mg/dL   GFR calc non Af Amer >60 >60 mL/min   GFR calc Af Amer >60 >60 mL/min   Anion gap 10 5 - 15    Comment: Performed at [facility], [place] [address] [place], [place] [address]  CBC     Status: Abnormal   Collection Time: 02/13/19  4:51 AM  Result Value Ref Range   WBC 12.8 (H) 4.0 - 10.5 K/uL   RBC 3.20 (L) 3.87 - 5.11 MIL/uL   Hemoglobin 10.6 (L) 12.0 - 15.0 g/dL   HCT 33.4 (L) 36.0 - 46.0 %   MCV 104.4 (H) 80.0 - 100.0 fL   MCH 33.1 26.0 - 34.0 pg   MCHC 31.7 30.0 - 36.0 g/dL   RDW 13.2 11.5 - 15.5 %   Platelets 666 (H) 150 - 400 K/uL   nRBC 0.0 0.0 - 0.2 %    Comment: Performed at [facility], [place] [address] [place], [place] [address]  Protime-INR     Status: None   Collection Time: 02/13/19 10:06 AM  Result Value Ref Range   Prothrombin  Time 12.6 11.4 - 15.2 seconds   INR 1.0 0.8 - 1.2    Comment: (NOTE) INR goal varies based on device and disease states. Performed at [facility], [place] [address], [place], [place] [address]   Aerobic/Anaerobic Culture (surgical/deep wound)     Status: None (Preliminary result)   Collection Time: 02/13/19  3:38 PM   Specimen: Abscess  Result Value Ref Range   Specimen Description      ABSCESS ABDOMEN Performed at [place] [address] [place], [place] [address]    Special Requests NONE    Gram Stain      ABUNDANT WBC PRESENT, PREDOMINANTLY PMN ABUNDANT GRAM POSITIVE COCCI ABUNDANT GRAM NEGATIVE RODS MODERATE GRAM POSITIVE RODS Performed at [facility], [place] [address], [place], [place] [address]    Culture PENDING    Report Status PENDING     Imaging / Studies: Ct Abdomen Pelvis W Contrast  Result Date: 02/12/2019 CLINICAL DATA:  Recent partial colectomy for diverticulitis EXAM: CT ABDOMEN AND PELVIS WITH CONTRAST TECHNIQUE: Multidetector CT imaging of the abdomen and pelvis was performed using the standard protocol following bolus administration of intravenous contrast. Oral contrast was also administered. CONTRAST:  173m OMNIPAQUE IOHEXOL 300 MG/ML  SOLN COMPARISON:  April 12, 2018 FINDINGS: Lower chest: Lung bases are clear. Hepatobiliary: There is hepatic steatosis with somewhat greater fatty infiltration near the fissure for the ligamentum teres than elsewhere. No focal liver lesions are evident. The gallbladder wall is not appreciably thickened. There is no biliary duct dilatation. Pancreas: There is no pancreatic mass or inflammatory focus. Spleen: No splenic lesions are evident. Adrenals/Urinary Tract: Adrenals bilaterally appear normal. Kidneys bilaterally show no evident mass or hydronephrosis on either side. There is no appreciable renal or ureteral calculus on either side. The urinary bladder is midline with wall thickening, potentially due to surrounding inflammation in the pelvis. Stomach/Bowel: Postoperative changes noted in the pelvis with patent rectosigmoid region  anastomosis. There is nearby fluid in the dependent portion of the pelvis with a fluid collection measuring 3.9 x 2.5 cm, likely of postoperative etiology. No air is seen and thin this fluid. There is evidence of wall thickening in the mid the distal sigmoid colon, likely secondary to diverticulitis. There is evidence of a focal abscess arising in the left pelvis from this but diverticulitis, measuring 3.7 x 2.9 cm. No perforation is seen in this area. Note that there is an apparent sinus tract extending between the area of diverticulitis in the sigmoid colon and the left rectus muscle in the pelvic region. No air is seen within this tract. Elsewhere, there is no frank bowel obstruction. The terminal ileal region appears normal. No free air or portal venous air evident. Vascular/Lymphatic: There is aortic atherosclerosis. No aneurysm evident. No adenopathy appreciable in the abdomen or pelvis. Reproductive: The periuterine region is surrounded with inflammation. Other: Appendix appears unremarkable. No ascites is evident in the abdomen or pelvis. Musculoskeletal: There are foci of degenerative change in the lumbar spine. There are no blastic or lytic bone lesions. No intramuscular lesions are evident apart apparent involvement of the anterior left rectus muscle with sinus tract from diverticulitis. IMPRESSION: 1. Sigmoid diverticulitis with extensive pelvic inflammation. There is a focal abscess arising in this area of diverticulitis measuring 3.7 x 2.9 cm. An apparent sinus tract is seen between the area of diverticulitis in the left rectus muscle without air or fluid in this tract or in the rectus muscle.  There is thickening of the rectus muscle in this area focally on the left. No pneumoperitoneum evident. 2. Rectosigmoid junction anastomosis appears patent, although there is inflammatory change in this area. A nearby fluid collection measures 3.9 x 2.5 cm which does not contain air. Question postoperative seroma  versus infected fluid. The attenuation value of this fluid is borderline high for purely serous fluid. 3.  No bowel obstruction.  Appendix region appears unremarkable. 4. Thickening of the urinary bladder wall may indicate intrinsic cystitis but likely is due to nearby inflammation from the diverticulitis and diverticular abscess. 5.   Hepatic steatosis. These results were called by telephone at the time of interpretation on 02/12/2019 at 4:04 pm to [facility] , who verbally acknowledged these results. Electronically Signed   By: Lowella Grip III M.D.   On: 02/12/2019 16:05   Ct Image Guided Drainage By Percutaneous Catheter  Result Date: 02/13/2019 INDICATION: 53 year old female with postoperative abscess in the left lower quadrant status post recent sigmoidectomy with primary repair and left salpingo oophorectomy. EXAM: CT drain MEDICATIONS: The patient is currently admitted to the hospital and receiving intravenous antibiotics. The antibiotics were administered within an appropriate time frame prior to the initiation of the procedure. ANESTHESIA/SEDATION: Fentanyl 100 mcg IV; Versed 2 mg IV Moderate Sedation Time:  10 minutes The patient was continuously monitored during the procedure by the interventional radiology nurse under my direct supervision. COMPLICATIONS: None immediate. PROCEDURE: Informed written consent was obtained from the patient after a thorough discussion of the procedural risks, benefits and alternatives. All questions were addressed. Maximal Sterile Barrier Technique was utilized including caps, mask, sterile gowns, sterile gloves, sterile drape, hand hygiene and skin antiseptic. A timeout was performed prior to the initiation of the procedure. A planning axial CT scan was performed. The left lower quadrant supra fascicular abscess collection was successfully identified. A suitable skin entry site was selected and marked. Care was taken to remain at medial to the inferior  epigastric vessels. Local anesthesia was attained by infiltration with 1% lidocaine. A small dermatotomy was made. Under intermittent CT guidance, an 18 gauge trocar needle was advanced into the fluid collection. A 0.035 wire was then advanced into the fluid collection. The trocar needle was removed. The skin tract was dilated to 50 [place] and a 12 [place] all-purpose drainage catheter was advanced over the wire and formed in the fluid collection. Aspiration yields approximately 15 mL frankly purulent material. A sample was sent for culture. Follow-up CT imaging demonstrates a well-positioned drainage catheter with significant improvement in the fluid and gas collection. The catheter was secured to the skin with 0 Prolene suture and a sterile bandage. IMPRESSION: 1. Successful placement of a 24 French percutaneous drain with aspiration of 15 mL frankly purulent fluid. Samples were sent for culture. PLAN: 1. Cultures are pending. 2. Maintain drain to JP bulb suction. 3. Flush drain at least once per shift. 4. Recommend drain injection prior to consideration of drain removal. Signed, Criselda Peaches, MD, RPVI [facility] Electronically Signed   By: Jacqulynn Cadet M.D.   On: 02/13/2019 18:32    Medications / Allergies: per chart  Antibiotics: Anti-infectives (From admission, onward)   Start     Dose/Rate Route Frequency Ordered Stop   02/13/19 0730  fluconazole (DIFLUCAN) tablet 200 mg     200 mg Oral Daily 02/13/19 0653     02/12/19 2200  piperacillin-tazobactam (ZOSYN) IVPB 3.375 g  3.375 g 12.5 mL/hr over 240 Minutes Intravenous Every 8 hours 02/12/19 2002          Note: Portions of this report may have been transcribed using voice recognition software. Every effort was made to ensure accuracy; however, inadvertent computerized transcription errors may be present.   Any transcriptional errors that result from this process are  unintentional.     Adin Hector, MD, FACS, MASCRS Gastrointestinal and Minimally Invasive Surgery    [address], [place], [place] [address] [phone] Main / Paging [phone] Fax

## 2019-02-14 NOTE — Progress Notes (Signed)
Pt experiencing a mental status change from baseline. Confusion and some hallucinations noted. Pt still able to tell [place] her name and birthday. Vitals stable. On-call MD Dr. Marcello Moores notified. Pt is in bed with bed alarm and will continue to monitor.

## 2019-02-15 ENCOUNTER — Other Ambulatory Visit: Payer: Self-pay | Admitting: Surgery

## 2019-02-15 DIAGNOSIS — T8143XA Infection following a procedure, organ and space surgical site, initial encounter: Secondary | ICD-10-CM

## 2019-02-15 MED ORDER — SODIUM CHLORIDE 0.9% FLUSH: 3.0000 mL | INTRAVENOUS | Status: DC | PRN

## 2019-02-15 MED ORDER — SODIUM CHLORIDE 0.9% FLUSH: 3.0000 mL | Freq: Two times a day (BID) | INTRAVENOUS | Status: DC

## 2019-02-15 MED ORDER — TRAMADOL HCL 50 MG PO TABS: 50.0000 mg | ORAL_TABLET | Freq: Four times a day (QID) | ORAL | Status: DC | PRN

## 2019-02-15 MED ORDER — AMOXICILLIN-POT CLAVULANATE 875-125 MG PO TABS: 1.0000 | ORAL_TABLET | Freq: Two times a day (BID) | ORAL | 1 refills | Status: DC

## 2019-02-15 MED ORDER — SODIUM CHLORIDE 0.9 % IV SOLN: 250.0000 mL | INTRAVENOUS | Status: DC | PRN

## 2019-02-15 MED ORDER — LACTATED RINGERS IV BOLUS: 1000.0000 mL | Freq: Three times a day (TID) | INTRAVENOUS | Status: DC | PRN

## 2019-02-15 MED FILL — AMOX-CLAV 875-125 MG TABLET: 875-125 | 5 days supply | Qty: 10 | Fill #0

## 2019-02-15 NOTE — Progress Notes (Signed)
Referring Physician(s):  Gross,S  Supervising Physician: Markus Daft  Patient Status:  [facility] - In-pt  Chief Complaint:  Abdominal pain  Subjective: Patient doing okay today.  Reports less abdominal/pelvic discomfort.  Denies nausea or vomiting.  Anxious to go home.   Allergies: Hydrocodone  Medications: Prior to Admission medications   Medication Sig Start Date End Date Taking? Authorizing Provider  albuterol (PROVENTIL HFA;VENTOLIN HFA) 108 (90 Base) MCG/ACT inhaler Inhale 2 puffs into the lungs every 6 (six) hours as needed for wheezing or shortness of breath. Patient taking differently: Inhale 1 puff into the lungs every 6 (six) hours as needed for wheezing or shortness of breath.  03/26/18  Yes Ladell Pier, MD  carvedilol (COREG) 3.125 MG tablet Take 1 tablet (3.125 mg total) by mouth 2 (two) times daily with a meal. 11/21/18  Yes Ladell Pier, MD  gabapentin (NEURONTIN) 300 MG capsule Take 1 capsule (300 mg total) by mouth 3 (three) times daily. Increase to 4x/day as needed 02/04/19  Yes Gross, Remo Lipps, MD  traMADol (ULTRAM) 50 MG tablet Take 1-2 tablets (50-100 mg total) by mouth every 6 (six) hours as needed for moderate pain or severe pain. 01/30/19  Yes [name], MD  amoxicillin-clavulanate (AUGMENTIN) 875-125 MG tablet Take 1 tablet by mouth 2 (two) times daily. 02/15/19   [name], MD     Vital Signs: BP 116/75   Pulse 74   Temp (!) 97.4 F (36.3 C) (Oral)   Resp 18   Ht 5' 2"  (1.575 m)   Wt 106 lb 11.2 oz (48.4 kg)   LMP 06/20/2012   SpO2 99%   BMI 19.52 kg/m   Physical Exam left lower quadrant drain intact, insertion site clean and dry, site mildly tender to palpation, output about 10 cc of blood-tinged fluid.  Drain flushed without difficulty.  Imaging: Ct Abdomen Pelvis W Contrast  Result Date: 02/12/2019 CLINICAL DATA:  Recent partial colectomy for diverticulitis EXAM: CT ABDOMEN AND PELVIS WITH CONTRAST TECHNIQUE: Multidetector CT  imaging of the abdomen and pelvis was performed using the standard protocol following bolus administration of intravenous contrast. Oral contrast was also administered. CONTRAST:  160m OMNIPAQUE IOHEXOL 300 MG/ML  SOLN COMPARISON:  April 12, 2018 FINDINGS: Lower chest: Lung bases are clear. Hepatobiliary: There is hepatic steatosis with somewhat greater fatty infiltration near the fissure for the ligamentum teres than elsewhere. No focal liver lesions are evident. The gallbladder wall is not appreciably thickened. There is no biliary duct dilatation. Pancreas: There is no pancreatic mass or inflammatory focus. Spleen: No splenic lesions are evident. Adrenals/Urinary Tract: Adrenals bilaterally appear normal. Kidneys bilaterally show no evident mass or hydronephrosis on either side. There is no appreciable renal or ureteral calculus on either side. The urinary bladder is midline with wall thickening, potentially due to surrounding inflammation in the pelvis. Stomach/Bowel: Postoperative changes noted in the pelvis with patent rectosigmoid region anastomosis. There is nearby fluid in the dependent portion of the pelvis with a fluid collection measuring 3.9 x 2.5 cm, likely of postoperative etiology. No air is seen and thin this fluid. There is evidence of wall thickening in the mid the distal sigmoid colon, likely secondary to diverticulitis. There is evidence of a focal abscess arising in the left pelvis from this but diverticulitis, measuring 3.7 x 2.9 cm. No perforation is seen in this area. Note that there is an apparent sinus tract extending between the area of diverticulitis in the sigmoid colon and the left rectus  muscle in the pelvic region. No air is seen within this tract. Elsewhere, there is no frank bowel obstruction. The terminal ileal region appears normal. No free air or portal venous air evident. Vascular/Lymphatic: There is aortic atherosclerosis. No aneurysm evident. No adenopathy appreciable in  the abdomen or pelvis. Reproductive: The periuterine region is surrounded with inflammation. Other: Appendix appears unremarkable. No ascites is evident in the abdomen or pelvis. Musculoskeletal: There are foci of degenerative change in the lumbar spine. There are no blastic or lytic bone lesions. No intramuscular lesions are evident apart apparent involvement of the anterior left rectus muscle with sinus tract from diverticulitis. IMPRESSION: 1. Sigmoid diverticulitis with extensive pelvic inflammation. There is a focal abscess arising in this area of diverticulitis measuring 3.7 x 2.9 cm. An apparent sinus tract is seen between the area of diverticulitis in the left rectus muscle without air or fluid in this tract or in the rectus muscle. There is thickening of the rectus muscle in this area focally on the left. No pneumoperitoneum evident. 2. Rectosigmoid junction anastomosis appears patent, although there is inflammatory change in this area. A nearby fluid collection measures 3.9 x 2.5 cm which does not contain air. Question postoperative seroma versus infected fluid. The attenuation value of this fluid is borderline high for purely serous fluid. 3.  No bowel obstruction.  Appendix region appears unremarkable. 4. Thickening of the urinary bladder wall may indicate intrinsic cystitis but likely is due to nearby inflammation from the diverticulitis and diverticular abscess. 5.   Hepatic steatosis. These results were called by telephone at the time of interpretation on 02/12/2019 at 4:04 pm to [facility] , who verbally acknowledged these results. Electronically Signed   By: Lowella Grip III M.D.   On: 02/12/2019 16:05   Ct Image Guided Drainage By Percutaneous Catheter  Result Date: 02/13/2019 INDICATION: 53 year old female with postoperative abscess in the left lower quadrant status post recent sigmoidectomy with primary repair and left salpingo oophorectomy. EXAM: CT drain MEDICATIONS: The patient is  currently admitted to the hospital and receiving intravenous antibiotics. The antibiotics were administered within an appropriate time frame prior to the initiation of the procedure. ANESTHESIA/SEDATION: Fentanyl 100 mcg IV; Versed 2 mg IV Moderate Sedation Time:  10 minutes The patient was continuously monitored during the procedure by the interventional radiology nurse under my direct supervision. COMPLICATIONS: None immediate. PROCEDURE: Informed written consent was obtained from the patient after a thorough discussion of the procedural risks, benefits and alternatives. All questions were addressed. Maximal Sterile Barrier Technique was utilized including caps, mask, sterile gowns, sterile gloves, sterile drape, hand hygiene and skin antiseptic. A timeout was performed prior to the initiation of the procedure. A planning axial CT scan was performed. The left lower quadrant supra fascicular abscess collection was successfully identified. A suitable skin entry site was selected and marked. Care was taken to remain at medial to the inferior epigastric vessels. Local anesthesia was attained by infiltration with 1% lidocaine. A small dermatotomy was made. Under intermittent CT guidance, an 18 gauge trocar needle was advanced into the fluid collection. A 0.035 wire was then advanced into the fluid collection. The trocar needle was removed. The skin tract was dilated to 61 [place] and a 12 [place] all-purpose drainage catheter was advanced over the wire and formed in the fluid collection. Aspiration yields approximately 15 mL frankly purulent material. A sample was sent for culture. Follow-up CT imaging demonstrates a well-positioned drainage catheter with significant improvement in the fluid  and gas collection. The catheter was secured to the skin with 0 Prolene suture and a sterile bandage. IMPRESSION: 1. Successful placement of a 27 French percutaneous drain with aspiration of 15 mL frankly purulent fluid. Samples were  sent for culture. PLAN: 1. Cultures are pending. 2. Maintain drain to JP bulb suction. 3. Flush drain at least once per shift. 4. Recommend drain injection prior to consideration of drain removal. Signed, Criselda Peaches, MD, RPVI [facility] Radiology Electronically Signed   By: Jacqulynn Cadet M.D.   On: 02/13/2019 18:32    Labs:  CBC: Recent Labs    02/03/19 0834 02/04/19 0317 02/12/19 2013 02/13/19 0451  WBC 15.5* 10.1 16.6* 12.8*  HGB 10.8* 9.4* 11.1* 10.6*  HCT 33.2* 30.1* 34.6* 33.4*  PLT 284 301 738* 666*    COAGS: Recent Labs    03/10/18 0620 04/03/18 1258 02/13/19 1006  INR 0.96 0.87 1.0  APTT 26 24  --     BMP: Recent Labs    01/31/19 0326 02/03/19 0834 02/12/19 2013 02/13/19 0451  NA 133* 134* 128* 132*  K 3.9 3.7 3.6 4.3  CL 99 92* 90* 94*  CO2 27 28 25 28   GLUCOSE 128* 102* 94 93  BUN 6 8 7  5*  CALCIUM 7.4* 8.5* 8.5* 8.4*  CREATININE 0.59 0.49 0.52 0.57  GFRNONAA >60 >60 >60 >60  GFRAA >60 >60 >60 >60    LIVER FUNCTION TESTS: Recent Labs    03/09/18 1315 03/26/18 1026 03/29/18 1140 02/12/19 2013  BILITOT 0.7 0.3 0.3 0.7  AST 25 24 15 24   ALT 17 20 17 12   ALKPHOS 45 49 41 80  PROT 6.5 6.7 7.2 7.0  ALBUMIN 3.1* 4.1 4.0 3.0*    Assessment and Plan: Pt with hx perf diverticulitis/abscess, s/p LAR/abscess drainage/splenic flexure mobilization, left SOO 7/15; now with LLQ post op abscess, s/p drain 7/29; afebrile; no new labs, drain fluid cultures with gram-negative rods; if patient discharged home with drain recommend once daily flushing with 5 cc sterile normal saline, output recording and dressing changes as needed; will schedule for follow-up in IR drain clinic in 7-10 days.  Patient instructed on method of flushing drain.   Electronically Signed: D. Rowe Robert, PA-C 02/15/2019, 4:56 PM   I spent a total of 20 minutes at the the patient's bedside AND on the patient's hospital  floor or unit, greater than 50% of which was counseling/coordinating care for left pelvic abscess drain    Patient ID: Christina Miller, female   DOB: 1966/07/18, 53 y.o.   MRN: [id number]

## 2019-02-15 NOTE — Discharge Instructions (Signed)
DRAIN CARE:   You have a closed bulb drain to help you heal.    A bulb drain is a small, plastic reservoir which creates a gentle suction. It is used to remove excess fluid from a surgical wound. The color and amount of fluid will vary. Immediately after surgery, the fluid is bright red. It may gradually change to a yellow color. When the amount decreases to about 1 or 2 tablespoons (15 to 30 cc) per 24 hours, your caregiver will usually remove it.  JP Care  The Jackson-Pratt drainage system has flexible tubing attached to a soft, plastic bulb with a stopper. The drainage end of the tubing, which is flat and white, goes into your body through a small opening near your incision (surgical cut). A stitch holds the drainage end in place. The rest of the tube is outside your body, attached to the bulb. When the bulb is compressed with the stopper in place, it creates a vacuum. This causes a constant gentle suction, which helps draw out fluid that collects under your incision. The bulb should be compressed at all times, except when you are emptying the drainage.  How long you will have your Jackson-Pratt depends on your surgery and the amount of fluid is draining. This is different for everyone. The Jackson-Pratt is usually removed when the drainage is 30 mL or less over 24 hours. To keep track of how much drainage youre having, you will record the amount in a drainage log. Its important to bring the log with you to your follow-up appointments.  Caring for Your Jackson-Pratt at Home In order to care for your Jackson-Pratt at home, you or your caregiver will do the following:  Empty the drain once a day and record the color and amount of drainage  Care for the area where the tubing enters your skin by washing with soap and water.  Milk the tubing to help move clots into the bulb.  Do this before you empty and measure your drainage. Look in the mirror at the tubing. This will help you see where your  hands need to be. Pinch the tubing close to where it goes into your skin between your thumb and forefinger. With the thumb and forefinger of your other hand, pinch the tubing right below your other fingers. Keep your fingers pinched and slide them down the tubing, pushing any clots down toward the bulb. You may want to use alcohol swabs to help you slide your fingers down the tubing. Repeat steps 3 and 4 as necessary to push clots from the tubing into the bulb. If you are not able to move a clot into the bulb, call your doctors office. The fluid may leak around the insertion site if a clot is blocking the drainage flow. If there is fluid in the bulb and no leakage at the insertion site, the drain is working.  How to Empty Your Jackson-Pratt and Record the Drainage You will need to empty your Jackson-Pratt every day  Gather the following supplies:  Measuring container your nurse gave you Jackson-Pratt Drainage Record  Pen or pencil  Instructions Clean an area to work on. Clean your hands thoroughly. Unplug the stopper on top of your Jackson-Pratt. This will cause the bulb to expand. Do not touch the inside of the stopper or the inner area of the opening on the bulb. Turn your Jackson-Pratt upside down, gently squeeze the bulb, and pour the drainage into the measuring container. Turn your Jackson-Pratt right  side up. Squeeze the bulb until your fingers feel the palm of your hand. Keep squeezing the bulb while you replug the stopper. Make sure the bulb stays fully compressed to ensure constant, gentle suction.    Check the amount and color of drainage in the measuring container. The first couple days after surgery the fluid may be dark red. This is normal. As you heal the fluid may look pink or pale yellow. Record this amount and the color of drainage on your Jackson-Pratt Drainage Record. Flush the drainage down the toilet and rinse the measuring container with water.  Caring for the  Insertion Site  Once you have emptied the drainage, clean your hands again. Check the area around the insertion site. Look for tenderness, swelling, or pus. If you have any of these, or if you have a temperature of 101 F (38.3 C) or higher, you may have an infection. Call your doctors office.  Sometimes, the drain causes redness the size of a dime at your insertion site. This is normal. Your healthcare provider will tell you if you should place a bandage over the insertion site.  Wash drain site with soap & water (dilute hydrogen peroxide PRN) daily & replace clean dressing / tape    DAILY CARE  Keep the bulb compressed at all times, except while emptying it. The compression creates suction.   Keep sites where the tubes enter the skin dry and covered with a light bandage (dressing).   Tape the tubes to your skin, 1 to 2 inches below the insertion sites, to keep from pulling on your stitches. Tubes are stitched in place and will not slip out.   Pin the bulb to your shirt (not to your pants) with a safety pin.   For the first few days after surgery, there usually is more fluid in the bulb. Empty the bulb whenever it becomes half full because the bulb does not create enough suction if it is too full. Include this amount in your 24 hour totals.   When the amount of drainage decreases, empty the bulb at the same time every day. Write down the amounts and the 24 hour totals. Your caregiver will want to know them. This helps your caregiver know when the tubes can be removed.   (We anticipate removing the drain in 1-3 weeks, depending on when the output is <60m a day for 2+ days)  If there is drainage around the tube sites, change dressings and keep the area dry. If you see a clot in the tube, leave it alone. However, if the tube does not appear to be draining, let your caregiver know.  TO EMPTY THE BULB  Open the stopper to release suction.   Holding the stopper out of the way, pour  drainage into the measuring cup that was sent home with you.   Measure and write down the amount. If there are 2 bulbs, note the amount of drainage from bulb 1 or bulb 2 and keep the totals separate. Your caregiver will want to know which tube is draining more.   Compress the bulb by folding it in half.   Replace the stopper.   Check the tape that holds the tube to your skin, and pin the bulb to your shirt.  SEEK MEDICAL CARE IF:  The drainage develops a bad odor.   You have an oral temperature above 102 F (38.9 C).   The amount of drainage from your wound suddenly increases or decreases.  You accidentally pull out your drain.   You have any other questions or concerns.  MAKE SURE YOU:   Understand these instructions.   Will watch your condition.   Will get help right away if you are not doing well or get worse.     Call our office if you have any questions about your drain. [phone]   SURGERY: POST OP INSTRUCTIONS (Surgery for small bowel obstruction, colon resection, etc)   ######################################################################  EAT Gradually transition to a high fiber diet with a fiber supplement over the next few days after discharge  WALK Walk an hour a day.  Control your pain to do that.    CONTROL PAIN Control pain so that you can walk, sleep, tolerate sneezing/coughing, go up/down stairs.  HAVE A BOWEL MOVEMENT DAILY Keep your bowels regular to avoid problems.  OK to try a laxative to override constipation.  OK to use an antidairrheal to slow down diarrhea.  Call if not better after 2 tries  CALL IF YOU HAVE PROBLEMS/CONCERNS Call if you are still struggling despite following these instructions. Call if you have concerns not answered by these instructions  ######################################################################   DIET Follow a light diet the first few days at home.  Start with a bland diet such as soups, liquids,  starchy foods, low fat foods, etc.  If you feel full, bloated, or constipated, stay on a ful liquid or pureed/blenderized diet for a few days until you feel better and no longer constipated. Be sure to drink plenty of fluids every day to avoid getting dehydrated (feeling dizzy, not urinating, etc.). Gradually add a fiber supplement to your diet over the next week.  Gradually get back to a regular solid diet.  Avoid fast food or heavy meals the first week as you are more likely to get nauseated. It is expected for your digestive tract to need a few months to get back to normal.  It is common for your bowel movements and stools to be irregular.  You will have occasional bloating and cramping that should eventually fade away.  Until you are eating solid food normally, off all pain medications, and back to regular activities; your bowels will not be normal. Focus on eating a low-fat, high fiber diet the rest of your life (See Getting to [place], below).  CARE of your INCISION or WOUND It is good for closed incision and even open wounds to be washed every day.  Shower every day.  Short baths are fine.  Wash the incisions and wounds clean with soap & water.    If you have a closed incision(s), wash the incision with soap & water every day.  You may leave closed incisions open to air if it is dry.   You may cover the incision with clean gauze & replace it after your daily shower for comfort. If you have skin tapes (Steristrips) or skin glue (Dermabond) on your incision, leave them in place.  They will fall off on their own like a scab.  You may trim any edges that curl up with clean scissors.  If you have staples, set up an appointment for them to be removed in the office in 10 days after surgery.  If you have a drain, wash around the skin exit site with soap & water and place a new dressing of gauze or band aid around the skin every day.  Keep the drain site clean & dry.    If you have an  open wound  with packing, see wound care instructions.  In general, it is encouraged that you remove your dressing and packing, shower with soap & water, and replace your dressing once a day.  Pack the wound with clean gauze moistened with normal (0.9%) saline to keep the wound moist & uninfected.  Pressure on the dressing for 30 minutes will stop most wound bleeding.  Eventually your body will heal & pull the open wound closed over the next few months.  Raw open wounds will occasionally bleed or secrete yellow drainage until it heals closed.  Drain sites will drain a little until the drain is removed.  Even closed incisions can have mild bleeding or drainage the first few days until the skin edges scab over & seal.   If you have an open wound with a wound vac, see wound vac care instructions.     ACTIVITIES as tolerated Start light daily activities --- self-care, walking, climbing stairs-- beginning the day after surgery.  Gradually increase activities as tolerated.  Control your pain to be active.  Stop when you are tired.  Ideally, walk several times a day, eventually an hour a day.   Most people are back to most day-to-day activities in a few weeks.  It takes 4-8 weeks to get back to unrestricted, intense activity. If you can walk 30 minutes without difficulty, it is safe to try more intense activity such as jogging, treadmill, bicycling, low-impact aerobics, swimming, etc. Save the most intensive and strenuous activity for last (Usually 4-8 weeks after surgery) such as sit-ups, heavy lifting, contact sports, etc.  Refrain from any intense heavy lifting or straining until you are off narcotics for pain control.  You will have off days, but things should improve week-by-week. DO NOT PUSH THROUGH PAIN.  Let pain be your guide: If it hurts to do something, don't do it.  Pain is your body warning you to avoid that activity for another week until the pain goes down. You may drive when you are no longer taking  narcotic prescription pain medication, you can comfortably wear a seatbelt, and you can safely make sudden turns/stops to protect yourself without hesitating due to pain. You may have sexual intercourse when it is comfortable. If it hurts to do something, stop.  MEDICATIONS Take your usually prescribed home medications unless otherwise directed.   Blood thinners:  Usually you can restart any strong blood thinners after the second postoperative day.  It is OK to take aspirin right away.     If you are on strong blood thinners (warfarin/Coumadin, Plavix, Xerelto, Eliquis, Pradaxa, etc), discuss with your surgeon, medicine PCP, and/or cardiologist for instructions on when to restart the blood thinner & if blood monitoring is needed (PT/INR blood check, etc).     PAIN CONTROL Pain after surgery or related to activity is often due to strain/injury to muscle, tendon, nerves and/or incisions.  This pain is usually short-term and will improve in a few months.  To help speed the process of healing and to get back to regular activity more quickly, DO THE FOLLOWING THINGS TOGETHER: 1. Increase activity gradually.  DO NOT PUSH THROUGH PAIN 2. Use Ice and/or Heat 3. Try Gentle Massage and/or Stretching 4. Take over the counter pain medication 5. Take Narcotic prescription pain medication for more severe pain  Good pain control = faster recovery.  It is better to take more medicine to be more active than to stay in bed all day to avoid medications.  1.  Increase activity gradually Avoid heavy lifting at first, then increase to lifting as tolerated over the next 6 weeks. Do not push through the pain.  Listen to your body and avoid positions and maneuvers than reproduce the pain.  Wait a few days before trying something more intense Walking an hour a day is encouraged to help your body recover faster and more safely.  Start slowly and stop when getting sore.  If you can walk 30 minutes without stopping or  pain, you can try more intense activity (running, jogging, aerobics, cycling, swimming, treadmill, sex, sports, weightlifting, etc.) Remember: If it hurts to do it, then dont do it! 2. Use Ice and/or Heat You will have swelling and bruising around the incisions.  This will take several weeks to resolve. Ice packs or heating pads (6-8 times a day, 30-60 minutes at a time) will help sooth soreness & bruising. Some people prefer to use ice alone, heat alone, or alternate between ice & heat.  Experiment and see what works best for you.  Consider trying ice for the first few days to help decrease swelling and bruising; then, switch to heat to help relax sore spots and speed recovery. Shower every day.  Short baths are fine.  It feels good!  Keep the incisions and wounds clean with soap & water.   3. Try Gentle Massage and/or Stretching Massage at the area of pain many times a day Stop if you feel pain - do not overdo it 4. Take over the counter pain medication This helps the muscle and nerve tissues become less irritable and calm down faster Choose ONE of the following over-the-counter anti-inflammatory medications: Acetaminophen 535m tabs (Tylenol) 1-2 pills with every meal and just before bedtime (avoid if you have liver problems or if you have acetaminophen in you narcotic prescription) Naproxen 2298mtabs (ex. Aleve, Naprosyn) 1-2 pills twice a day (avoid if you have kidney, stomach, IBD, or bleeding problems) Ibuprofen 2009mabs (ex. Advil, Motrin) 3-4 pills with every meal and just before bedtime (avoid if you have kidney, stomach, IBD, or bleeding problems) Take with food/snack several times a day as directed for at least 2 weeks to help keep pain / soreness down & more manageable. 5. Take Narcotic prescription pain medication for more severe pain A prescription for strong pain control is often given to you upon discharge (for example: oxycodone/Percocet, hydrocodone/Norco/Vicodin, or  tramadol/Ultram) Take your pain medication as prescribed. Be mindful that most narcotic prescriptions contain Tylenol (acetaminophen) as well - avoid taking too much Tylenol. If you are having problems/concerns with the prescription medicine (does not control pain, nausea, vomiting, rash, itching, etc.), please call us [phone] see if we need to switch you to a different pain medicine that will work better for you and/or control your side effects better. If you need a refill on your pain medication, you must call the office before 4 pm and on weekdays only.  By federal law, prescriptions for narcotics cannot be called into a pharmacy.  They must be filled out on paper & picked up from our office by the patient or authorized caretaker.  Prescriptions cannot be filled after 4 pm nor on weekends.    WHEN TO CALL US [phone] uncontrolled or worsening pain  Fever over 101 F (38.5 C) Concerns with the incision: Worsening pain, redness, rash/hives, swelling, bleeding, or drainage Reactions / problems with new medications (itching, rash, hives, nausea, etc.) Nausea and/or vomiting Difficulty urinating  Difficulty breathing Worsening fatigue, dizziness, lightheadedness, blurred vision Other concerns If you are not getting better after two weeks or are noticing you are getting worse, contact our office [phone] for further advice.  We may need to adjust your medications, re-evaluate you in the office, send you to the emergency room, or see what other things we can do to help. The clinic staff is available to answer your questions during regular business hours (8:30am-5pm).  Please dont hesitate to call and ask to speak to one of our nurses for clinical concerns.    A surgeon from [facility] is always on call at the hospitals 24 hours/day If you have a medical emergency, go to the nearest emergency room or call 911.  FOLLOW UP in our office One the day of your  discharge from the hospital (or the next business weekday), please call [facility] to set up or confirm an appointment to see your surgeon in the office for a follow-up appointment.  Usually it is 2-3 weeks after your surgery.   If you have skin staples at your incision(s), let the office know so we can set up a time in the office for the nurse to remove them (usually around 10 days after surgery). Make sure that you call for appointments the day of discharge (or the next business weekday) from the hospital to ensure a convenient appointment time. IF YOU HAVE DISABILITY OR FAMILY LEAVE FORMS, BRING THEM TO THE OFFICE FOR PROCESSING.  DO NOT GIVE THEM TO YOUR DOCTOR.  [facility] [address], [place], [place] [facility] [place], [place]  [address] ? [phone] - Main [phone] - [place],  [phone] - Fax [email or web address]  GETTING TO GOOD BOWEL HEALTH. It is expected for your digestive tract to need a few months to get back to normal.  It is common for your bowel movements and stools to be irregular.  You will have occasional bloating and cramping that should eventually fade away.  Until you are eating solid food normally, off all pain medications, and back to regular activities; your bowels will not be normal.   Avoiding constipation The goal: ONE SOFT BOWEL MOVEMENT A DAY!    Drink plenty of fluids.  Choose water first. TAKE A FIBER SUPPLEMENT EVERY DAY THE REST OF YOUR LIFE During your first week back home, gradually add back a fiber supplement every day Experiment which form you can tolerate.   There are many forms such as powders, tablets, wafers, gummies, etc Psyllium bran (Metamucil), methylcellulose (Citrucel), Miralax or Glycolax, Benefiber, Flax Seed.  Adjust the dose week-by-week (1/2 dose/day to 6 doses a day) until you are moving your bowels 1-2 times a day.  Cut back the dose or try a different fiber product if it is giving you  problems such as diarrhea or bloating. Sometimes a laxative is needed to help jump-start bowels if constipated until the fiber supplement can help regulate your bowels.  If you are tolerating eating & you are farting, it is okay to try a gentle laxative such as double dose MiraLax, prune juice, or Milk of Magnesia.  Avoid using laxatives too often. Stool softeners can sometimes help counteract the constipating effects of narcotic pain medicines.  It can also cause diarrhea, so avoid using for too long. If you are still constipated despite taking fiber daily, eating solids, and a few doses of laxatives, call our office. Controlling diarrhea Try drinking liquids and eating bland foods for  a few days to avoid stressing your intestines further. Avoid dairy products (especially milk & ice cream) for a short time.  The intestines often can lose the ability to digest lactose when stressed. Avoid foods that cause gassiness or bloating.  Typical foods include beans and other legumes, cabbage, broccoli, and dairy foods.  Avoid greasy, spicy, fast foods.  Every person has some sensitivity to other foods, so listen to your body and avoid those foods that trigger problems for you. Probiotics (such as active yogurt, Align, etc) may help repopulate the intestines and colon with normal bacteria and calm down a sensitive digestive tract Adding a fiber supplement gradually can help thicken stools by absorbing excess fluid and retrain the intestines to act more normally.  Slowly increase the dose over a few weeks.  Too much fiber too soon can backfire and cause cramping & bloating. It is okay to try and slow down diarrhea with a few doses of antidiarrheal medicines.   Bismuth subsalicylate (ex. Kayopectate, Pepto Bismol) for a few doses can help control diarrhea.  Avoid if pregnant.   Loperamide (Imodium) can slow down diarrhea.  Start with one tablet (77m) first.  Avoid if you are having fevers or severe pain.  ILEOSTOMY  PATIENTS WILL HAVE CHRONIC DIARRHEA since their colon is not in use.    Drink plenty of liquids.  You will need to drink even more glasses of water/liquid a day to avoid getting dehydrated. Record output from your ileostomy.  Expect to empty the bag every 3-4 hours at first.  Most people with a permanent ileostomy empty their bag 4-6 times at the least.   Use antidiarrheal medicine (especially Imodium) several times a day to avoid getting dehydrated.  Start with a dose at bedtime & breakfast.  Adjust up or down as needed.  Increase antidiarrheal medications as directed to avoid emptying the bag more than 8 times a day (every 3 hours). Work with your wound ostomy nurse to learn care for your ostomy.  See ostomy care instructions. TROUBLESHOOTING IRREGULAR BOWELS 1) Start with a soft & bland diet. No spicy, greasy, or fried foods.  2) Avoid gluten/wheat or dairy products from diet to see if symptoms improve. 3) Miralax 17gm or flax seed mixed in [place] water or juice-daily. May use 2-4 times a day as needed. 4) Gas-X, Phazyme, etc. as needed for gas & bloating.  5) Prilosec (omeprazole) over-the-counter as needed 6)  Consider probiotics (Align, Activa, etc) to help calm the bowels down  Call your doctor if you are getting worse or not getting better.  Sometimes further testing (cultures, endoscopy, X-ray studies, CT scans, bloodwork, etc.) may be needed to help diagnose and treat the cause of the diarrhea. [facility] [place]  [phone] Main.    [phone] - Toll Free.   [phone] Fax [email or web address]

## 2019-02-15 NOTE — Discharge Summary (Signed)
Physician Discharge Summary    Patient ID: Christina Miller MRN: [id number] DOB/AGE: Oct 05, 1965  53 y.o.  Patient Care Team: Ladell Pier, MD as PCP - General (Internal Medicine) [name], MD as Consulting Physician (General Surgery) [name], MD as Consulting Physician (Gastroenterology)  Admit date: 02/12/2019  Discharge date: 02/15/2019  Hospital Stay = 2 days    Discharge Diagnoses:  Principal Problem:   Intra-abdominal abscess [facility]) Active Problems:   Diverticulitis of large intestine with perforation and abscess   Financial difficulties   Anxiety state   COPD (chronic obstructive pulmonary disease) ([place])   Tobacco dependence   Essential hypertension   Multiple sclerosis ([place])   Colonic diverticular abscess   Consults: Interventional radiology  Hospital Course:   Patient with diverticulitis significant abscess and chronic colocutaneous fistula.  Underwent robotic resection anastomosis.  Returned with pain and fever nausea vomiting.  Found to have 4 cm fluid collection.  Admitted.  IV antibiotics.  IV fluids.  Pain and nausea control.  Drain placed by interventional radiology on day after admission, 7/29.  The patient's ileus resolved.  She gradually mobilized and advanced to a solid diet.  Pain and other symptoms were treated aggressively.    By the time of discharge, the patient was walking well the hallways, eating food, having flatus.  Pain was well-controlled on an oral medications.  Based on meeting discharge criteria and continuing to recover, I felt it was safe for the patient to be discharged from the hospital to further recover with close followup.  Patient familiar with drain care and had discussion today with interventional radiology about flushing and follow-up.  Postoperative recommendations were discussed in detail.  They are written as well.  Discharged Condition: good  Discharge Exam: Blood pressure 116/75, pulse 74,  temperature (!) 97.4 F (36.3 C), temperature source Oral, resp. rate 18, height 5' 2"  (1.575 m), weight 48.4 kg, last menstrual period 06/20/2012, SpO2 99 %.  General: Pt awake/alert/oriented x4 in No acute distress Eyes: PERRL, normal EOM.  Sclera clear.  No icterus Neuro: CN II-XII intact w/o focal sensory/motor deficits. Lymph: No head/neck/groin lymphadenopathy Psych:  No delerium/psychosis/paranoia HENT: Normocephalic, Mucus membranes moist.  No thrush Neck: Supple, No tracheal deviation Chest: No chest wall pain w good excursion CV:  Pulses intact.  Regular rhythm MS: Normal AROM mjr joints.  No obvious deformity Abdomen: Soft.  Nondistended.  Mildly tender at incisions only.  No evidence of peritonitis.  No incarcerated hernias. Left lower quadrant drain with serosanguineous fluid.  No purulence.  No stool.  No succus. Ext:  SCDs BLE.  No mjr edema.  No cyanosis Skin: No petechiae / purpura   Disposition:   Follow-up Information    [name], MD. Schedule an appointment as soon as possible for a visit in 3 week(s).   Specialty: General Surgery Contact information: [address] [place] [address] [phone]           Discharge disposition: 01-Home or Self Care       Discharge Instructions    Call MD for:   Complete by: As directed    FEVER > 101.5 F  (temperatures < 101.5 F are not significant)   Call MD for:  extreme fatigue   Complete by: As directed    Call MD for:  persistant dizziness or light-headedness   Complete by: As directed    Call MD for:  persistant nausea and vomiting   Complete by: As directed  Call MD for:  redness, tenderness, or signs of infection (pain, swelling, redness, odor or green/yellow discharge around incision site)   Complete by: As directed    Call MD for:  severe uncontrolled pain   Complete by: As directed    Diet - low sodium heart healthy   Complete by: As directed    Start with a bland diet  such as soups, liquids, starchy foods, low fat foods, etc. the first few days at home. Gradually advance to a solid, low-fat, high fiber diet by the end of the first week at home.   Add a fiber supplement to your diet (Metamucil, etc) If you feel full, bloated, or constipated, stay on a full liquid or pureed/blenderized diet for a few days until you feel better and are no longer constipated.   Discharge instructions   Complete by: As directed    See Discharge Instructions If you are not getting better after two weeks or are noticing you are getting worse, contact our office [phone] for further advice.  We may need to adjust your medications, re-evaluate you in the office, send you to the emergency room, or see what other things we can do to help. The clinic staff is available to answer your questions during regular business hours (8:30am-5pm).  Please don't hesitate to call and ask to speak to one of our nurses for clinical concerns.    A surgeon from [facility] is always on call at the hospitals 24 hours/day If you have a medical emergency, go to the nearest emergency room or call 911.   Discharge wound care:   Complete by: As directed    It is good for closed incisions and even open wounds to be washed every day.  Shower every day.  Short baths are fine.  Wash the incisions and wounds clean with soap & water.    You may leave closed incisions open to air if it is dry.   You may cover the incision with clean gauze & replace it after your daily shower for comfort.  DRAIN:  You have a drain in place.  Every day change the dressing in the shower, wash around the skin exit site with soap & water and place a new dressing of gauze or band aid around the skin every day.  Keep the drain site clean & dry.   Driving Restrictions   Complete by: As directed    You may drive when: - you are no longer taking narcotic prescription pain medication - you can comfortably wear a seatbelt -  you can safely make sudden turns/stops without pain.   Increase activity slowly   Complete by: As directed    Start light daily activities --- self-care, walking, climbing stairs- beginning the day after surgery.  Gradually increase activities as tolerated.  Control your pain to be active.  Stop when you are tired.  Ideally, walk several times a day, eventually an hour a day.   Most people are back to most day-to-day activities in a few weeks.  It takes 4-6 weeks to get back to unrestricted, intense activity. If you can walk 30 minutes without difficulty, it is safe to try more intense activity such as jogging, treadmill, bicycling, low-impact aerobics, swimming, etc. Save the most intensive and strenuous activity for last (Usually 4-8 weeks after surgery) such as sit-ups, heavy lifting, contact sports, etc.  Refrain from any intense heavy lifting or straining until you are off narcotics for pain control.  You will have off days, but things should improve week-by-week. DO NOT PUSH THROUGH PAIN.  Let pain be your guide: If it hurts to do something, don't do it.   Lifting restrictions   Complete by: As directed    If you can walk 30 minutes without difficulty, it is safe to try more intense activity such as jogging, treadmill, bicycling, low-impact aerobics, swimming, etc. Save the most intensive and strenuous activity for last (Usually 4-8 weeks after surgery) such as sit-ups, heavy lifting, contact sports, etc.   Refrain from any intense heavy lifting or straining until you are off narcotics for pain control.  You will have off days, but things should improve week-by-week. DO NOT PUSH THROUGH PAIN.  Let pain be your guide: If it hurts to do something, don't do it.  Pain is your body warning you to avoid that activity for another week until the pain goes down.   May shower / Bathe   Complete by: As directed    May walk up steps   Complete by: As directed    Remove dressing in 72 hours   Complete by:  As directed    Make sure all dressings are removed by the third day after surgery.  Leave incisions open to air.  OK to cover incisions with gauze or bandages as desired   Sexual Activity Restrictions   Complete by: As directed    You may have sexual intercourse when it is comfortable. If it hurts to do something, stop.      Allergies as of 02/15/2019      Reactions   Hydrocodone Nausea And Vomiting      Medication List    TAKE these medications   albuterol 108 (90 Base) MCG/ACT inhaler Commonly known as: VENTOLIN HFA Inhale 2 puffs into the lungs every 6 (six) hours as needed for wheezing or shortness of breath. What changed: how much to take   amoxicillin-clavulanate 875-125 MG tablet Commonly known as: Augmentin Take 1 tablet by mouth 2 (two) times daily.   carvedilol 3.125 MG tablet Commonly known as: COREG Take 1 tablet (3.125 mg total) by mouth 2 (two) times daily with a meal.   gabapentin 300 MG capsule Commonly known as: NEURONTIN Take 1 capsule (300 mg total) by mouth 3 (three) times daily. Increase to 4x/day as needed   traMADol 50 MG tablet Commonly known as: ULTRAM Take 1-2 tablets (50-100 mg total) by mouth every 6 (six) hours as needed for moderate pain or severe pain.            Discharge Care Instructions  (From admission, onward)         Start     Ordered   02/15/19 0000  Discharge wound care:    Comments: It is good for closed incisions and even open wounds to be washed every day.  Shower every day.  Short baths are fine.  Wash the incisions and wounds clean with soap & water.    You may leave closed incisions open to air if it is dry.   You may cover the incision with clean gauze & replace it after your daily shower for comfort.  DRAIN:  You have a drain in place.  Every day change the dressing in the shower, wash around the skin exit site with soap & water and place a new dressing of gauze or band aid around the skin every day.  Keep the drain  site clean & dry.   02/15/19  1138          Significant Diagnostic Studies:  Results for orders placed or performed during the hospital encounter of 02/12/19 (from the past 72 hour(s))  CBC WITH DIFFERENTIAL     Status: Abnormal   Collection Time: 02/12/19  8:13 PM  Result Value Ref Range   WBC 16.6 (H) 4.0 - 10.5 K/uL   RBC 3.30 (L) 3.87 - 5.11 MIL/uL   Hemoglobin 11.1 (L) 12.0 - 15.0 g/dL   HCT 34.6 (L) 36.0 - 46.0 %   MCV 104.8 (H) 80.0 - 100.0 fL   MCH 33.6 26.0 - 34.0 pg   MCHC 32.1 30.0 - 36.0 g/dL   RDW 13.2 11.5 - 15.5 %   Platelets 738 (H) 150 - 400 K/uL   nRBC 0.0 0.0 - 0.2 %   Neutrophils Relative % 73 %   Neutro Abs 12.1 (H) 1.7 - 7.7 K/uL   Lymphocytes Relative 16 %   Lymphs Abs 2.7 0.7 - 4.0 K/uL   Monocytes Relative 8 %   Monocytes Absolute 1.4 (H) 0.1 - 1.0 K/uL   Eosinophils Relative 1 %   Eosinophils Absolute 0.2 0.0 - 0.5 K/uL   Basophils Relative 1 %   Basophils Absolute 0.1 0.0 - 0.1 K/uL   Immature Granulocytes 1 %   Abs Immature Granulocytes 0.08 (H) 0.00 - 0.07 K/uL    Comment: Performed at [facility], [place] [address] [place], [place] [address]  Comprehensive metabolic panel     Status: Abnormal   Collection Time: 02/12/19  8:13 PM  Result Value Ref Range   Sodium 128 (L) 135 - 145 mmol/L   Potassium 3.6 3.5 - 5.1 mmol/L   Chloride 90 (L) 98 - 111 mmol/L   CO2 25 22 - 32 mmol/L   Glucose, Bld 94 70 - 99 mg/dL   BUN 7 6 - 20 mg/dL   Creatinine, Ser 0.52 0.44 - 1.00 mg/dL   Calcium 8.5 (L) 8.9 - 10.3 mg/dL   Total Protein 7.0 6.5 - 8.1 g/dL   Albumin 3.0 (L) 3.5 - 5.0 g/dL   AST 24 15 - 41 U/L   ALT 12 0 - 44 U/L   Alkaline Phosphatase 80 38 - 126 U/L   Total Bilirubin 0.7 0.3 - 1.2 mg/dL   GFR calc non Af Amer >60 >60 mL/min   GFR calc Af Amer >60 >60 mL/min   Anion gap 13 5 - 15    Comment: Performed at [facility], [place] [address], [place], [place] [address]  Basic metabolic panel     Status:  Abnormal   Collection Time: 02/13/19  4:51 AM  Result Value Ref Range   Sodium 132 (L) 135 - 145 mmol/L   Potassium 4.3 3.5 - 5.1 mmol/L   Chloride 94 (L) 98 - 111 mmol/L   CO2 28 22 - 32 mmol/L   Glucose, Bld 93 70 - 99 mg/dL   BUN 5 (L) 6 - 20 mg/dL   Creatinine, Ser 0.57 0.44 - 1.00 mg/dL   Calcium 8.4 (L) 8.9 - 10.3 mg/dL   GFR calc non Af Amer >60 >60 mL/min   GFR calc Af Amer >60 >60 mL/min   Anion gap 10 5 - 15    Comment: Performed at [facility], [place] [address], [place], [place] [address]  CBC     Status: Abnormal   Collection Time: 02/13/19  4:51 AM  Result Value Ref Range  WBC 12.8 (H) 4.0 - 10.5 K/uL   RBC 3.20 (L) 3.87 - 5.11 MIL/uL   Hemoglobin 10.6 (L) 12.0 - 15.0 g/dL   HCT 33.4 (L) 36.0 - 46.0 %   MCV 104.4 (H) 80.0 - 100.0 fL   MCH 33.1 26.0 - 34.0 pg   MCHC 31.7 30.0 - 36.0 g/dL   RDW 13.2 11.5 - 15.5 %   Platelets 666 (H) 150 - 400 K/uL   nRBC 0.0 0.0 - 0.2 %    Comment: Performed at [facility], [place] [address], [place], [place] [address]  Protime-INR     Status: None   Collection Time: 02/13/19 10:06 AM  Result Value Ref Range   Prothrombin Time 12.6 11.4 - 15.2 seconds   INR 1.0 0.8 - 1.2    Comment: (NOTE) INR goal varies based on device and disease states. Performed at [facility], [place] [address], [place], [place] [address]   Aerobic/Anaerobic Culture (surgical/deep wound)     Status: None (Preliminary result)   Collection Time: 02/13/19  3:38 PM   Specimen: Abscess  Result Value Ref Range   Specimen Description      ABSCESS ABDOMEN Performed at [place] [address] [place], [place] [address]    Special Requests NONE    Gram Stain      ABUNDANT WBC PRESENT, PREDOMINANTLY PMN ABUNDANT GRAM POSITIVE COCCI ABUNDANT GRAM NEGATIVE RODS MODERATE GRAM POSITIVE RODS    Culture      MODERATE ESCHERICHIA COLI REPEATING SUSCEPTIBILITY Performed at [facility], [place] [address], [place], [place] [address]    Report Status PENDING     Ct Abdomen Pelvis W Contrast  Result Date: 02/12/2019 CLINICAL DATA:  Recent partial colectomy for diverticulitis EXAM: CT ABDOMEN AND PELVIS WITH CONTRAST TECHNIQUE: Multidetector CT imaging of the abdomen and pelvis was performed using the standard protocol following bolus administration of intravenous contrast. Oral contrast was also administered. CONTRAST:  146m OMNIPAQUE IOHEXOL 300 MG/ML  SOLN COMPARISON:  April 12, 2018 FINDINGS: Lower chest: Lung bases are clear. Hepatobiliary: There is hepatic steatosis with somewhat greater fatty infiltration near the fissure for the ligamentum teres than elsewhere. No focal liver lesions are evident. The gallbladder wall is not appreciably thickened. There is no biliary duct dilatation. Pancreas: There is no pancreatic mass or inflammatory focus. Spleen: No splenic lesions are evident. Adrenals/Urinary Tract: Adrenals bilaterally appear normal. Kidneys bilaterally show no evident mass or hydronephrosis on either side. There is no appreciable renal or ureteral calculus on either side. The urinary bladder is midline with wall thickening, potentially due to surrounding inflammation in the pelvis. Stomach/Bowel: Postoperative changes noted in the pelvis with patent rectosigmoid region anastomosis. There is nearby fluid in the dependent portion of the pelvis with a fluid collection measuring 3.9 x 2.5 cm, likely of postoperative etiology. No air is seen and thin this fluid. There is evidence of wall thickening in the mid the distal sigmoid colon, likely secondary to diverticulitis. There is evidence of a focal abscess arising in the left pelvis from this but diverticulitis, measuring 3.7 x 2.9 cm. No perforation is seen in this area. Note that there is an apparent sinus tract extending between the area of diverticulitis in the sigmoid colon and the left rectus muscle in the pelvic  region. No air is seen within this tract. Elsewhere, there is no frank bowel obstruction. The terminal ileal region appears normal. No free air or portal  venous air evident. Vascular/Lymphatic: There is aortic atherosclerosis. No aneurysm evident. No adenopathy appreciable in the abdomen or pelvis. Reproductive: The periuterine region is surrounded with inflammation. Other: Appendix appears unremarkable. No ascites is evident in the abdomen or pelvis. Musculoskeletal: There are foci of degenerative change in the lumbar spine. There are no blastic or lytic bone lesions. No intramuscular lesions are evident apart apparent involvement of the anterior left rectus muscle with sinus tract from diverticulitis. IMPRESSION: 1. Sigmoid diverticulitis with extensive pelvic inflammation. There is a focal abscess arising in this area of diverticulitis measuring 3.7 x 2.9 cm. An apparent sinus tract is seen between the area of diverticulitis in the left rectus muscle without air or fluid in this tract or in the rectus muscle. There is thickening of the rectus muscle in this area focally on the left. No pneumoperitoneum evident. 2. Rectosigmoid junction anastomosis appears patent, although there is inflammatory change in this area. A nearby fluid collection measures 3.9 x 2.5 cm which does not contain air. Question postoperative seroma versus infected fluid. The attenuation value of this fluid is borderline high for purely serous fluid. 3.  No bowel obstruction.  Appendix region appears unremarkable. 4. Thickening of the urinary bladder wall may indicate intrinsic cystitis but likely is due to nearby inflammation from the diverticulitis and diverticular abscess. 5.   Hepatic steatosis. These results were called by telephone at the time of interpretation on 02/12/2019 at 4:04 pm to [facility] , who verbally acknowledged these results. Electronically Signed   By: Lowella Grip III M.D.   On: 02/12/2019 16:05   Ct Image  Guided Drainage By Percutaneous Catheter  Result Date: 02/13/2019 INDICATION: 53 year old female with postoperative abscess in the left lower quadrant status post recent sigmoidectomy with primary repair and left salpingo oophorectomy. EXAM: CT drain MEDICATIONS: The patient is currently admitted to the hospital and receiving intravenous antibiotics. The antibiotics were administered within an appropriate time frame prior to the initiation of the procedure. ANESTHESIA/SEDATION: Fentanyl 100 mcg IV; Versed 2 mg IV Moderate Sedation Time:  10 minutes The patient was continuously monitored during the procedure by the interventional radiology nurse under my direct supervision. COMPLICATIONS: None immediate. PROCEDURE: Informed written consent was obtained from the patient after a thorough discussion of the procedural risks, benefits and alternatives. All questions were addressed. Maximal Sterile Barrier Technique was utilized including caps, mask, sterile gowns, sterile gloves, sterile drape, hand hygiene and skin antiseptic. A timeout was performed prior to the initiation of the procedure. A planning axial CT scan was performed. The left lower quadrant supra fascicular abscess collection was successfully identified. A suitable skin entry site was selected and marked. Care was taken to remain at medial to the inferior epigastric vessels. Local anesthesia was attained by infiltration with 1% lidocaine. A small dermatotomy was made. Under intermittent CT guidance, an 18 gauge trocar needle was advanced into the fluid collection. A 0.035 wire was then advanced into the fluid collection. The trocar needle was removed. The skin tract was dilated to 74 [place] and a 12 [place] all-purpose drainage catheter was advanced over the wire and formed in the fluid collection. Aspiration yields approximately 15 mL frankly purulent material. A sample was sent for culture. Follow-up CT imaging demonstrates a well-positioned drainage  catheter with significant improvement in the fluid and gas collection. The catheter was secured to the skin with 0 Prolene suture and a sterile bandage. IMPRESSION: 1. Successful placement of a 76 French percutaneous drain with aspiration  of 15 mL frankly purulent fluid. Samples were sent for culture. PLAN: 1. Cultures are pending. 2. Maintain drain to JP bulb suction. 3. Flush drain at least once per shift. 4. Recommend drain injection prior to consideration of drain removal. Signed, Criselda Peaches, MD, RPVI [facility] Electronically Signed   By: Jacqulynn Cadet M.D.   On: 02/13/2019 18:32    Past Medical History:  Diagnosis Date   Abnormal Pap smear    cryo   Allergy    Anemia    Bone fracture    ankle   Cancer ([place]) 1987   cervical Cancer   Colonic diverticular abscess 02/12/2019   COPD (chronic obstructive pulmonary disease) ([facility])    Emphysema    Emphysema of lung ([facility])    GERD (gastroesophageal reflux disease)    Headache(784.0)    Hypertension    Infection    urinary tract infection   MS (multiple sclerosis) ([place])    Neuromuscular disorder ([place])    hands and feet face and lip and muscle weakness due to MS   Pelvic abscess in female    Sepsis due to Escherichia coli (E. coli) ([place]) 03/09/2018    Past Surgical History:  Procedure Laterality Date   ARM WOUND REPAIR / CLOSURE     BREAST BIOPSY Left 07/03/2012   COLPOSCOPY     DILATION AND CURETTAGE OF UTERUS     IR CATHETER TUBE CHANGE  04/04/2018   IR CATHETER TUBE CHANGE  05/16/2018   IR CATHETER TUBE CHANGE  07/05/2018   IR CATHETER TUBE CHANGE  09/21/2018   IR RADIOLOGIST EVAL & MGMT  03/29/2018   IR RADIOLOGIST EVAL & MGMT  04/12/2018   JP DRAIN TUBE     PROCTOSCOPY N/A 01/30/2019   Procedure: RIGID PROCTOSCOPY;  Surgeon: [name], MD;  Location: [facility];  Service: General;  Laterality: N/A;   ROBOT ASSISTED LAPAROSCOPIC  PARTIAL COLECTOMY  01/30/2019   for diverticulitis.  LSO as well   SALPINGOOPHORECTOMY Left 01/30/2019   LSO at time of sigmoid colectomy   TUBAL LIGATION      Social History   Socioeconomic History   Marital status: Legally Separated    Spouse name: Not on file   Number of children: Not on file   Years of education: Not on file   Highest education level: Not on file  Occupational History   Not on file  Social Needs   Financial resource strain: Not on file   Food insecurity    Worry: Not on file    Inability: Not on file   Transportation needs    Medical: Not on file    Non-medical: Not on file  Tobacco Use   Smoking status: Current Every Day Smoker    Packs/day: 1.00    Years: 15.00    Pack years: 15.00    Types: Cigarettes   Smokeless tobacco: Former Network engineer and Sexual Activity   Alcohol use: Yes    Comment: occ   Drug use: No   Sexual activity: Yes    Birth control/protection: Surgical  Lifestyle   Physical activity    Days per week: Not on file    Minutes per session: Not on file   Stress: Not on file  Relationships   Social connections    Talks on phone: Not on file    Gets together: Not on file    Attends religious service: Not on file  Active member of club or organization: Not on file    Attends meetings of clubs or organizations: Not on file    Relationship status: Not on file   Intimate partner violence    Fear of current or ex partner: Not on file    Emotionally abused: Not on file    Physically abused: Not on file    Forced sexual activity: Not on file  Other Topics Concern   Not on file  Social History Narrative   ** Merged History Encounter **        Family History  Problem Relation Age of Onset   Diabetes Father    Diabetes Sister    Hypertension Sister    Diabetes Brother    Hypertension Brother    Lung cancer Mother    Colon cancer Neg Hx    Stomach cancer Neg Hx    Pancreatic cancer Neg  Hx    Esophageal cancer Neg Hx    Rectal cancer Neg Hx     Current Facility-Administered Medications  Medication Dose Route Frequency Provider Last Rate Last Dose   0.9 %  sodium chloride infusion  250 mL Intravenous PRN [name], MD       0.9 %  sodium chloride infusion  250 mL Intravenous PRN [name], MD       acetaminophen (TYLENOL) tablet 1,000 mg  1,000 mg Oral TID [name], MD   1,000 mg at 02/15/19 1019   albuterol (PROVENTIL) (2.5 MG/3ML) 0.083% nebulizer solution 2.5 mg  2.5 mg Inhalation Q6H PRN [name], MD       alum & mag hydroxide-simeth (MAALOX/MYLANTA) 200-200-20 MG/5ML suspension 30 mL  30 mL Oral Q6H PRN [name], MD       carvedilol (COREG) tablet 3.125 mg  3.125 mg Oral BID WC [name], MD   3.125 mg at 02/15/19 1019   diphenhydrAMINE (BENADRYL) capsule 25 mg  25 mg Oral Q6H PRN [name], MD       Or   diphenhydrAMINE (BENADRYL) injection 25 mg  25 mg Intravenous Q6H PRN [name], MD       fluconazole (DIFLUCAN) tablet 200 mg  200 mg Oral Daily [name], MD   200 mg at 02/15/19 1019   gabapentin (NEURONTIN) capsule 300 mg  300 mg Oral TID [name], MD   300 mg at 02/15/19 1019   guaiFENesin-dextromethorphan (ROBITUSSIN DM) 100-10 MG/5ML syrup 10 mL  10 mL Oral Q4H PRN [name], MD       hydrALAZINE (APRESOLINE) injection 5-10 mg  5-10 mg Intravenous Q4H PRN Tabius Rood, Remo Lipps, MD       hydrocortisone (ANUSOL-HC) 2.5 % rectal cream 1 application  1 application Topical QID PRN [name], MD       hydrocortisone cream 1 % 1 application  1 application Topical TID PRN [name], MD       HYDROmorphone (DILAUDID) injection 0.5-2 mg  0.5-2 mg Intravenous Q2H PRN Zaylia Riolo, Remo Lipps, MD       lactated ringers bolus 1,000 mL  1,000 mL Intravenous Q8H PRN Zoiee Wimmer, Remo Lipps, MD       lactated ringers bolus 1,000 mL  1,000 mL Intravenous Q8H PRN Sheriann Newmann, Remo Lipps, MD       lip balm (CARMEX) ointment 1 application  1  application Topical BID [name], MD   1 application at 17/79/39 1021   LORazepam (ATIVAN) injection 0.5-1 mg  0.5-1 mg Intravenous Q8H PRN [name], MD   1  mg at 02/14/19 2249   magic mouthwash  15 mL Oral QID PRN [name], MD       menthol-cetylpyridinium (CEPACOL) lozenge 3 mg  1 lozenge Oral PRN [name], MD       methocarbamol (ROBAXIN) 1,000 mg in dextrose 5 % 50 mL IVPB  1,000 mg Intravenous Q6H PRN [name], MD       methocarbamol (ROBAXIN) tablet 500 mg  500 mg Oral Q6H PRN [name], MD   500 mg at 02/13/19 0559   metoCLOPramide (REGLAN) injection 5-10 mg  5-10 mg Intravenous Q8H PRN [name], MD       metoprolol tartrate (LOPRESSOR) injection 5 mg  5 mg Intravenous Q6H PRN [name], MD       ondansetron (ZOFRAN-ODT) disintegrating tablet 4 mg  4 mg Oral Q6H PRN [name], MD       Or   ondansetron [facility]) injection 4 mg  4 mg Intravenous Q6H PRN [name], MD       oxyCODONE (Oxy IR/ROXICODONE) immediate release tablet 5-10 mg  5-10 mg Oral Q4H PRN [name], MD   10 mg at 02/14/19 1630   phenol (CHLORASEPTIC) mouth spray 1-2 spray  1-2 spray Mouth/Throat PRN [name], MD       piperacillin-tazobactam (ZOSYN) IVPB 3.375 g  3.375 g Intravenous Tor Netters, MD 12.5 mL/hr at 02/15/19 0747 3.375 g at 02/15/19 0747   polyethylene glycol (MIRALAX / GLYCOLAX) packet 17 g  17 g Oral Daily PRN [name], MD       simethicone (MYLICON) chewable tablet 40 mg  40 mg Oral Q6H PRN [name], MD       sodium chloride flush (NS) 0.9 % injection 3 mL  3 mL Intravenous Gorden Harms, MD   3 mL at 02/14/19 2249   sodium chloride flush (NS) 0.9 % injection 3 mL  3 mL Intravenous PRN [name], MD       sodium chloride flush (NS) 0.9 % injection 3 mL  3 mL Intravenous Gorden Harms, MD       sodium chloride flush (NS) 0.9 % injection 3 mL  3 mL Intravenous PRN [name], MD       sodium chloride  flush (NS) 0.9 % injection 5 mL  5 mL Intracatheter Q8H Jacqulynn Cadet, MD   5 mL at 02/15/19 0810   traMADol (ULTRAM) tablet 50-100 mg  50-100 mg Oral Q6H PRN [name], MD       witch hazel-glycerin (TUCKS) pad 1 application  1 application Topical PRN [name], MD         Allergies  Allergen Reactions   Hydrocodone Nausea And Vomiting    Signed: Morton Peters, MD, FACS, MASCRS [facility]    [address] [place], [place] [address] [phone] Main / Paging [phone] Fax   02/15/2019, 11:39 AM

## 2019-02-18 LAB — AEROBIC/ANAEROBIC CULTURE W GRAM STAIN (SURGICAL/DEEP WOUND)

## 2019-02-20 ENCOUNTER — Ambulatory Visit
Admission: RE | Admit: 2019-02-20 | Discharge: 2019-02-20 | Disposition: A | Discharge status: Home / Self Care | Payer: Medicaid Other | Source: Ambulatory Visit | Attending: Surgery | Admitting: Surgery

## 2019-02-20 ENCOUNTER — Encounter: Payer: Self-pay | Admitting: Radiology

## 2019-02-20 DIAGNOSIS — T8143XA Infection following a procedure, organ and space surgical site, initial encounter: Secondary | ICD-10-CM

## 2019-02-20 HISTORY — PX: IR RADIOLOGIST EVAL & MGMT: IMG5224

## 2019-02-20 MED ORDER — IOPAMIDOL (ISOVUE-300) INJECTION 61%
100.0000 mL | Freq: Once | INTRAVENOUS | Status: AC | PRN
  Administered 2019-02-20: 100 mL via INTRAVENOUS

## 2019-02-20 NOTE — Progress Notes (Signed)
Patient ID: Christina Miller, female   DOB: 1966/03/14, 53 y.o.   MRN: [id number]         Chief Complaint: Percutaneous drainage catheter evaluation and management  Referring Physician(s): Gross,Steven  History of Present Illness: Christina Miller is a 53 y.o. female with past medical history significant for COPD, cervical cancer, multiple sclerosis and hypertension who is well-known to the interventional radiology service after undergoing CT-guided placement of a percutaneous drainage catheter placement on 03/10/2018 for perforated diverticulitis.  Patient ultimately underwent a sigmoidectomy with primary repair, however CT scan of the abdomen pelvis performed 02/11/2018 demonstrated a postoperative fluid collection within the left lower abdomen/pelvis requiring placement of a new percutaneous drainage catheter on 02/13/2019.  Patient returns today to the interventional radiology clinic for drainage catheter evaluation and management.  Patient continues to flush the percutaneous drainage catheter, though reports little to no output from the drain for the past several days.  Patient continues on her prescribed course of antibiotics.  Patient reports expected postoperative and drain related abdominal/pelvic pain though denies worsening of her symptoms.  She denies fever or chills.  She denies difficulty urinating, dysuria or urgency.  Past Medical History:  Diagnosis Date   Abnormal Pap smear    cryo   Allergy    Anemia    Bone fracture    ankle   Cancer ([place]) 1987   cervical Cancer   Colonic diverticular abscess 02/12/2019   COPD (chronic obstructive pulmonary disease) (HCC)    Emphysema    Emphysema of lung (HCC)    GERD (gastroesophageal reflux disease)    Headache(784.0)    Hypertension    Infection    urinary tract infection   MS (multiple sclerosis) ([place])    Neuromuscular disorder ([place])    hands and feet face and lip and muscle weakness due to MS    Pelvic abscess in female    Sepsis due to Escherichia coli (E. coli) ([place]) 03/09/2018    Past Surgical History:  Procedure Laterality Date   ARM WOUND REPAIR / CLOSURE     BREAST BIOPSY Left 07/03/2012   COLPOSCOPY     DILATION AND CURETTAGE OF UTERUS     IR CATHETER TUBE CHANGE  04/04/2018   IR CATHETER TUBE CHANGE  05/16/2018   IR CATHETER TUBE CHANGE  07/05/2018   IR CATHETER TUBE CHANGE  09/21/2018   IR RADIOLOGIST EVAL & MGMT  03/29/2018   IR RADIOLOGIST EVAL & MGMT  04/12/2018   IR RADIOLOGIST EVAL & MGMT  02/20/2019   JP DRAIN TUBE     PROCTOSCOPY N/A 01/30/2019   Procedure: RIGID PROCTOSCOPY;  Surgeon: [name], MD;  Location: [facility];  Service: General;  Laterality: N/A;   ROBOT ASSISTED LAPAROSCOPIC PARTIAL COLECTOMY  01/30/2019   for diverticulitis.  LSO as well   SALPINGOOPHORECTOMY Left 01/30/2019   LSO at time of sigmoid colectomy   TUBAL LIGATION      Allergies: Hydrocodone  Medications: Prior to Admission medications   Medication Sig Start Date End Date Taking? Authorizing Provider  albuterol (PROVENTIL HFA;VENTOLIN HFA) 108 (90 Base) MCG/ACT inhaler Inhale 2 puffs into the lungs every 6 (six) hours as needed for wheezing or shortness of breath. Patient taking differently: Inhale 1 puff into the lungs every 6 (six) hours as needed for wheezing or shortness of breath.  03/26/18   Ladell Pier, MD  amoxicillin-clavulanate (AUGMENTIN) 875-125 MG tablet Take 1 tablet by mouth 2 (two) times daily. 02/15/19  [name], MD  carvedilol (COREG) 3.125 MG tablet Take 1 tablet (3.125 mg total) by mouth 2 (two) times daily with a meal. 11/21/18   Ladell Pier, MD  gabapentin (NEURONTIN) 300 MG capsule Take 1 capsule (300 mg total) by mouth 3 (three) times daily. Increase to 4x/day as needed 02/04/19   [name], MD  traMADol (ULTRAM) 50 MG tablet Take 1-2 tablets (50-100 mg total) by mouth every 6 (six) hours as needed for moderate pain or severe  pain. 01/30/19   [name], MD     Family History  Problem Relation Age of Onset   Diabetes Father    Diabetes Sister    Hypertension Sister    Diabetes Brother    Hypertension Brother    Lung cancer Mother    Colon cancer Neg Hx    Stomach cancer Neg Hx    Pancreatic cancer Neg Hx    Esophageal cancer Neg Hx    Rectal cancer Neg Hx     Social History   Socioeconomic History   Marital status: Legally Separated    Spouse name: Not on file   Number of children: Not on file   Years of education: Not on file   Highest education level: Not on file  Occupational History   Not on file  Social Needs   Financial resource strain: Not on file   Food insecurity    Worry: Not on file    Inability: Not on file   Transportation needs    Medical: Not on file    Non-medical: Not on file  Tobacco Use   Smoking status: Current Every Day Smoker    Packs/day: 1.00    Years: 15.00    Pack years: 15.00    Types: Cigarettes   Smokeless tobacco: Former Network engineer and Sexual Activity   Alcohol use: Yes    Comment: occ   Drug use: No   Sexual activity: Yes    Birth control/protection: Surgical  Lifestyle   Physical activity    Days per week: Not on file    Minutes per session: Not on file   Stress: Not on file  Relationships   Social connections    Talks on phone: Not on file    Gets together: Not on file    Attends religious service: Not on file    Active member of club or organization: Not on file    Attends meetings of clubs or organizations: Not on file    Relationship status: Not on file  Other Topics Concern   Not on file  Social History Narrative   ** Merged History Encounter **        ECOG Status: 1 - Symptomatic but completely ambulatory  Review of Systems: A 12 point ROS discussed and pertinent positives are indicated in the HPI above.  All other systems are negative.  Review of Systems  Constitutional: Negative for chills  and fever.  Gastrointestinal:       Expected postoperative and drain related abdominal/pelvic pain  Genitourinary: Negative for difficulty urinating and urgency.  Hematological: Bruises/bleeds easily:    Psychiatric/Behavioral: The patient is nervous/anxious.     Vital Signs: BP (!) 149/105    Pulse (!) 127    LMP 06/20/2012    SpO2 (!) 80%   Physical Exam Vitals signs and nursing note reviewed. Exam conducted with a chaperone present.  Abdominal:       Comments: Location of the percutaneous drainage catheter  Psychiatric:     Comments: Patient is anxious and tearful     Mallampati Score:     Imaging: Ct Abdomen Pelvis W Contrast  Result Date: 02/20/2019 CLINICAL DATA:  History of perforated diverticulitis, post percutaneous drainage catheter placement on 03/10/2018. Patient ultimately underwent a sigmoidectomy, however CT scan of the abdomen and pelvis performed 02/11/2018 demonstrated a postoperative fluid collection requiring placement of a new percutaneous drainage catheter on 02/13/2019. Patient continues to flush the percutaneous drainage catheter the reports little to no output from the drain for the past several days. Patient continues on her prescribed course of antibiotics. Patient reports expected drain related abdominal pain though denies worsening symptoms, fever or chills. EXAM: CT ABDOMEN AND PELVIS WITH CONTRAST TECHNIQUE: Multidetector CT imaging of the abdomen and pelvis was performed using the standard protocol following bolus administration of intravenous contrast. CONTRAST:  155m ISOVUE-300 IOPAMIDOL (ISOVUE-300) INJECTION 61% COMPARISON:  CT abdomen and pelvis - 02/11/2018; 04/12/2018 CT-guided percutaneous drainage catheter placement - 02/13/2019; 03/10/2018 FINDINGS: Lower chest: Limited visualization of the lower thorax is negative for focal airspace opacity or pleural effusion. Normal heart size.  No pericardial effusion. Hepatobiliary: Normal hepatic contour.  Redemonstrated focal fatty infiltration adjacent to the fissure for ligamentum teres. No discrete hepatic lesions. Normal appearance of the gallbladder given degree distention. No radiopaque gallstones. No intra or extrahepatic biliary ductal dilatation. No ascites. Pancreas: Normal appearance of the pancreas. Spleen: Normal appearance of the spleen. Adrenals/Urinary Tract: There is symmetric enhancement of the bilateral kidneys. No definite renal stones on this postcontrast examination. No discrete renal lesions. No urinary obstruction or perinephric stranding. Normal appearance the bilateral adrenal glands. There is upward tenting of the left side of the urinary bladder which at least abuts the percutaneous drainage catheter (representative coronal image 55, series 6). Stomach/Bowel: Stable sequela of prior sigmoid colonic resection primary anastomosis. Interval reduction/resolution of peripherally enhancing fluid collection within the left lower abdomen/pelvis following percutaneous drainage catheter placement. The end of the percutaneous drainage catheter appears to abut the superior aspect of the dome of the urinary bladder with associated tenting and upward retraction (representative coronal image 55, series 6). No definitive new definable/drainable fluid collection within the abdomen or pelvis. Moderate colonic stool burden without evidence of enteric obstruction. No pneumoperitoneum, pneumatosis or portal venous gas. Normal appearance of the terminal ileum and the retrocecal appendix. Vascular/Lymphatic: Moderate to large amount of irregular mixed calcified and noncalcified atherosclerotic plaque throughout the normal caliber abdominal aorta, not resulting in hemodynamically significant stenosis. No abdominal aortic dissection or periaortic stranding. The major branch vessels of the abdominal aorta appear patent on this non CTA examination. No bulky retroperitoneal, mesenteric, pelvic or inguinal lymph  adenopathy. Reproductive: Normal appearance of the pelvic organs. There is a trace amount of fluid within the pelvic cul-de-sac without peripheral wall enhancement (image 67, series 2). Other: Serpiginous stranding along track of prior chronic percutaneous drainage catheter within the ventral tissues of left side of the lower abdomen/pelvis (image 59, series 2). Musculoskeletal: No acute or aggressive osseous abnormalities. Mild-to-moderate multilevel lumbar spine DDD, worse at L4-L5 and L5-S1 with disc space height loss, endplate irregularity and sclerosis. Bone islands are noted within the right femoral head and the left acetabulum. IMPRESSION: 1. Interval reduction/resolution of peripherally enhancing fluid collection within the left lower abdomen/pelvis following percutaneous drainage catheter placement. The end of the percutaneous drainage catheter appears to at least abut the dome of the urinary bladder with associated upward retraction/tenting. 2. No new definable/drainable fluid collection  to the abdomen or pelvis. 3. Sequela of previous colonic resection with primary anastomosis without evidence of enteric obstruction. 4. Aortic Atherosclerosis (ICD10-I70.0). PLAN: Patient subsequently underwent percutaneous drainage catheter injection. Electronically Signed   By: Sandi Mariscal M.D.   On: 02/20/2019 12:26   Ct Abdomen Pelvis W Contrast  Result Date: 02/12/2019 CLINICAL DATA:  Recent partial colectomy for diverticulitis EXAM: CT ABDOMEN AND PELVIS WITH CONTRAST TECHNIQUE: Multidetector CT imaging of the abdomen and pelvis was performed using the standard protocol following bolus administration of intravenous contrast. Oral contrast was also administered. CONTRAST:  135m OMNIPAQUE IOHEXOL 300 MG/ML  SOLN COMPARISON:  April 12, 2018 FINDINGS: Lower chest: Lung bases are clear. Hepatobiliary: There is hepatic steatosis with somewhat greater fatty infiltration near the fissure for the ligamentum teres  than elsewhere. No focal liver lesions are evident. The gallbladder wall is not appreciably thickened. There is no biliary duct dilatation. Pancreas: There is no pancreatic mass or inflammatory focus. Spleen: No splenic lesions are evident. Adrenals/Urinary Tract: Adrenals bilaterally appear normal. Kidneys bilaterally show no evident mass or hydronephrosis on either side. There is no appreciable renal or ureteral calculus on either side. The urinary bladder is midline with wall thickening, potentially due to surrounding inflammation in the pelvis. Stomach/Bowel: Postoperative changes noted in the pelvis with patent rectosigmoid region anastomosis. There is nearby fluid in the dependent portion of the pelvis with a fluid collection measuring 3.9 x 2.5 cm, likely of postoperative etiology. No air is seen and thin this fluid. There is evidence of wall thickening in the mid the distal sigmoid colon, likely secondary to diverticulitis. There is evidence of a focal abscess arising in the left pelvis from this but diverticulitis, measuring 3.7 x 2.9 cm. No perforation is seen in this area. Note that there is an apparent sinus tract extending between the area of diverticulitis in the sigmoid colon and the left rectus muscle in the pelvic region. No air is seen within this tract. Elsewhere, there is no frank bowel obstruction. The terminal ileal region appears normal. No free air or portal venous air evident. Vascular/Lymphatic: There is aortic atherosclerosis. No aneurysm evident. No adenopathy appreciable in the abdomen or pelvis. Reproductive: The periuterine region is surrounded with inflammation. Other: Appendix appears unremarkable. No ascites is evident in the abdomen or pelvis. Musculoskeletal: There are foci of degenerative change in the lumbar spine. There are no blastic or lytic bone lesions. No intramuscular lesions are evident apart apparent involvement of the anterior left rectus muscle with sinus tract from  diverticulitis. IMPRESSION: 1. Sigmoid diverticulitis with extensive pelvic inflammation. There is a focal abscess arising in this area of diverticulitis measuring 3.7 x 2.9 cm. An apparent sinus tract is seen between the area of diverticulitis in the left rectus muscle without air or fluid in this tract or in the rectus muscle. There is thickening of the rectus muscle in this area focally on the left. No pneumoperitoneum evident. 2. Rectosigmoid junction anastomosis appears patent, although there is inflammatory change in this area. A nearby fluid collection measures 3.9 x 2.5 cm which does not contain air. Question postoperative seroma versus infected fluid. The attenuation value of this fluid is borderline high for purely serous fluid. 3.  No bowel obstruction.  Appendix region appears unremarkable. 4. Thickening of the urinary bladder wall may indicate intrinsic cystitis but likely is due to nearby inflammation from the diverticulitis and diverticular abscess. 5.   Hepatic steatosis. These results were called by telephone at the time  of interpretation on 02/12/2019 at 4:04 pm to Carlena Hurl, [place] , who verbally acknowledged these results. Electronically Signed   By: Lowella Grip III M.D.   On: 02/12/2019 16:05   Dg Sinus/fist Tube Chk-non Gi  Result Date: 02/20/2019 CLINICAL DATA:  History of perforated diverticulitis, post percutaneous drainage catheter placement 03/10/2018. Patient ultimately underwent a sigmoidectomy, however CT scan of the abdomen pelvis performed 02/11/2018 demonstrated a postoperative fluid collection within the left lower abdomen/pelvis requiring placement of a new percutaneous drainage catheter on 02/13/2019. The patient continues to flush the percutaneous drainage catheter, though reports little to no output from the drain for the past several days. Patient continues on her prescribed course of antibiotics. Patient reports expected drain related abdominal pain, though denies  worsening symptoms, fever or chills. EXAM: ABSCESS INJECTION COMPARISON:  CT abdomen and pelvis-earlier same day CONTRAST:  10 cc Omnipaque 300-administered via the existing percutaneous drainage catheter. FLUOROSCOPY TIME:  1 minute (25 mGy) TECHNIQUE: The patient was positioned supine on the fluoroscopy table. A preprocedural spot fluoroscopic image was obtained of the left lower abdomen/pelvis and existing percutaneous drainage catheter Multiple spot fluoroscopic and radiographic images were obtained following the injection of a small amount of contrast via the existing percutaneous drainage catheter. Images were reviewed and discussed with referring surgeon, Dr. Johney Maine, decision was made to remove the percutaneous drainage catheter. As such, the external portion of the drainage catheter was cut and drainage catheters removed intact. A dressing was placed. The patient tolerated the procedure well without immediate postprocedural complication. FINDINGS: Preprocedural spot fluoroscopic image demonstrates unchanged positioning of percutaneous catheter with end coiled and locked within left lower abdomen/pelvis. Excreted contrast is seen within the urinary bladder Contrast injection demonstrates opacification the decompressed abscess cavity. There is a tiny fistulous connection the end of the percutaneous drainage catheter and the dome of the left side of the urinary bladder. Reflux of contrast is noted about the catheter tract to the skin entrance site. IMPRESSION: Drain injection demonstrates a tiny fistulous connection between the decompressed abscess cavity and dome of the left side of the urinary bladder. Following discussion with referring surgeon, Dr. Johney Maine, given resolution on CT imaging, patient's lack of symptoms and drain output, the decision was made to remove the percutaneous drainage catheter, which was done so at the patient's bedside without incident. PLAN: - The patient was instructed to complete her  prescribed course of antibiotics. - The patient was instructed to maintain follow-up appointments with Dr. Johney Maine and may otherwise follow-up at the interventional radiology drain clinic on a p.r.n. basis. Electronically Signed   By: Sandi Mariscal M.D.   On: 02/20/2019 14:41   Ct Image Guided Drainage By Percutaneous Catheter  Result Date: 02/13/2019 INDICATION: 53 year old female with postoperative abscess in the left lower quadrant status post recent sigmoidectomy with primary repair and left salpingo oophorectomy. EXAM: CT drain MEDICATIONS: The patient is currently admitted to the hospital and receiving intravenous antibiotics. The antibiotics were administered within an appropriate time frame prior to the initiation of the procedure. ANESTHESIA/SEDATION: Fentanyl 100 mcg IV; Versed 2 mg IV Moderate Sedation Time:  10 minutes The patient was continuously monitored during the procedure by the interventional radiology nurse under my direct supervision. COMPLICATIONS: None immediate. PROCEDURE: Informed written consent was obtained from the patient after a thorough discussion of the procedural risks, benefits and alternatives. All questions were addressed. Maximal Sterile Barrier Technique was utilized including caps, mask, sterile gowns, sterile gloves, sterile drape, hand hygiene and skin  antiseptic. A timeout was performed prior to the initiation of the procedure. A planning axial CT scan was performed. The left lower quadrant supra fascicular abscess collection was successfully identified. A suitable skin entry site was selected and marked. Care was taken to remain at medial to the inferior epigastric vessels. Local anesthesia was attained by infiltration with 1% lidocaine. A small dermatotomy was made. Under intermittent CT guidance, an 18 gauge trocar needle was advanced into the fluid collection. A 0.035 wire was then advanced into the fluid collection. The trocar needle was removed. The skin tract was  dilated to 72 [place] and a 12 [place] all-purpose drainage catheter was advanced over the wire and formed in the fluid collection. Aspiration yields approximately 15 mL frankly purulent material. A sample was sent for culture. Follow-up CT imaging demonstrates a well-positioned drainage catheter with significant improvement in the fluid and gas collection. The catheter was secured to the skin with 0 Prolene suture and a sterile bandage. IMPRESSION: 1. Successful placement of a 33 French percutaneous drain with aspiration of 15 mL frankly purulent fluid. Samples were sent for culture. PLAN: 1. Cultures are pending. 2. Maintain drain to JP bulb suction. 3. Flush drain at least once per shift. 4. Recommend drain injection prior to consideration of drain removal. Signed, Criselda Peaches, MD, RPVI [facility] Radiology Electronically Signed   By: Jacqulynn Cadet M.D.   On: 02/13/2019 18:32   Ir Radiologist Eval & Mgmt  Result Date: 02/20/2019 Please refer to notes tab for details about interventional procedure. (Op Note)   Labs:  CBC: Recent Labs    02/03/19 0834 02/04/19 0317 02/12/19 2013 02/13/19 0451  WBC 15.5* 10.1 16.6* 12.8*  HGB 10.8* 9.4* 11.1* 10.6*  HCT 33.2* 30.1* 34.6* 33.4*  PLT 284 301 738* 666*    COAGS: Recent Labs    03/10/18 0620 04/03/18 1258 02/13/19 1006  INR 0.96 0.87 1.0  APTT 26 24  --     BMP: Recent Labs    01/31/19 0326 02/03/19 0834 02/12/19 2013 02/13/19 0451  NA 133* 134* 128* 132*  K 3.9 3.7 3.6 4.3  CL 99 92* 90* 94*  CO2 27 28 25 28   GLUCOSE 128* 102* 94 93  BUN 6 8 7  5*  CALCIUM 7.4* 8.5* 8.5* 8.4*  CREATININE 0.59 0.49 0.52 0.57  GFRNONAA >60 >60 >60 >60  GFRAA >60 >60 >60 >60    LIVER FUNCTION TESTS: Recent Labs    03/09/18 1315 03/26/18 1026 03/29/18 1140 02/12/19 2013  BILITOT 0.7 0.3 0.3 0.7  AST 25 24 15 24   ALT 17 20 17 12   ALKPHOS 45 49 41 80  PROT 6.5 6.7 7.2 7.0    ALBUMIN 3.1* 4.1 4.0 3.0*    TUMOR MARKERS: No results for input(s): AFPTM, CEA, CA199, CHROMGRNA in the last 8760 hours.  Assessment and Plan:  Christina Miller is a 53 y.o. female with past medical history significant for COPD, cervical cancer, multiple sclerosis and hypertension who is well-known to the interventional radiology service post placement of a new percutaneous drainage catheter on 02/13/2019.  Patient continues to flush the percutaneous drainage catheter, though reports little to no output from the drain for the past several days.  Patient continues on her prescribed course of antibiotics.  Patient reports expected postoperative and drain related abdominal/pelvic pain though denies worsening of her symptoms.    CT scan of the abdomen and pelvis performed today in drain clinic demonstrates  interval reduction/resolution of peripherally enhancing fluid collection of the left lower abdomen/pelvis.  The end of the drainage catheter appears to at least abut the dome of the urinary bladder with associated upward retraction and tenting.  Subsequent drainage catheter injection demonstrates opacification of the decompressed abscess cavity with a very tiny fistulous connection between the decompressed abscess cavity and the urinary bladder.  Above findings were discussed with referring surgeon, Dr. Johney Maine, and the decision was made to remove the percutaneous drainage catheter given resolution of the collection on CT imaging, patient's lack of symptoms and drain output and concern that if the drain remains in place the more likelihood of maturation of this tiny fistulous connection.  This was discussed and agreed upon with the patient and the drainage catheter was removed at the patient's bedside without incident.  Plan: - The patient was instructed to complete her prescribed course of antibiotics. - The patient was instructed to maintain follow-up appointments with Dr. gross and  may otherwise follow-up in the interventional radiology drain clinic on a PRN basis.  The patient knows to call the interventional radiology clinic with any future questions or concerns.   Thank you for this interesting consult.  I greatly enjoyed meeting [facility] and look forward to participating in their care.  A copy of this report was sent to the requesting provider on this date.  Electronically Signed: Sandi Mariscal 02/20/2019, 4:05 PM   I spent a total of 10 Minutes in face to face in clinical consultation, greater than 50% of which was counseling/coordinating care for percutaneous drainage catheter evaluation and management

## 2019-02-26 ENCOUNTER — Emergency Department (HOSPITAL_COMMUNITY)
Admission: EM | Admit: 2019-02-26 | Discharge: 2019-02-26 | Disposition: A | Discharge status: Home / Self Care | Payer: Medicaid Other | Attending: Emergency Medicine | Admitting: Emergency Medicine

## 2019-02-26 ENCOUNTER — Other Ambulatory Visit: Payer: Self-pay

## 2019-02-26 ENCOUNTER — Encounter (HOSPITAL_COMMUNITY): Payer: Self-pay

## 2019-02-26 ENCOUNTER — Emergency Department (HOSPITAL_COMMUNITY): Payer: Medicaid Other

## 2019-02-26 DIAGNOSIS — F17210 Nicotine dependence, cigarettes, uncomplicated: Secondary | ICD-10-CM | POA: Insufficient documentation

## 2019-02-26 DIAGNOSIS — Z79899 Other long term (current) drug therapy: Secondary | ICD-10-CM | POA: Diagnosis not present

## 2019-02-26 DIAGNOSIS — Z8541 Personal history of malignant neoplasm of cervix uteri: Secondary | ICD-10-CM | POA: Insufficient documentation

## 2019-02-26 DIAGNOSIS — N3000 Acute cystitis without hematuria: Secondary | ICD-10-CM | POA: Insufficient documentation

## 2019-02-26 DIAGNOSIS — K651 Peritoneal abscess: Secondary | ICD-10-CM | POA: Diagnosis not present

## 2019-02-26 DIAGNOSIS — R350 Frequency of micturition: Secondary | ICD-10-CM | POA: Insufficient documentation

## 2019-02-26 DIAGNOSIS — J449 Chronic obstructive pulmonary disease, unspecified: Secondary | ICD-10-CM | POA: Insufficient documentation

## 2019-02-26 DIAGNOSIS — R1032 Left lower quadrant pain: Secondary | ICD-10-CM | POA: Diagnosis present

## 2019-02-26 DIAGNOSIS — I10 Essential (primary) hypertension: Secondary | ICD-10-CM | POA: Diagnosis not present

## 2019-02-26 LAB — CBC WITH DIFFERENTIAL/PLATELET
Abs Immature Granulocytes: 0.07 10*3/uL (ref 0.00–0.07)
Basophils Absolute: 0.1 10*3/uL (ref 0.0–0.1)
Basophils Relative: 1 %
Eosinophils Absolute: 0.4 10*3/uL (ref 0.0–0.5)
Eosinophils Relative: 2 %
HCT: 43.8 % (ref 36.0–46.0)
Hemoglobin: 14.3 g/dL (ref 12.0–15.0)
Immature Granulocytes: 0 %
Lymphocytes Relative: 23 %
Lymphs Abs: 4.2 10*3/uL — ABNORMAL HIGH (ref 0.7–4.0)
MCH: 33.4 pg (ref 26.0–34.0)
MCHC: 32.6 g/dL (ref 30.0–36.0)
MCV: 102.3 fL — ABNORMAL HIGH (ref 80.0–100.0)
Monocytes Absolute: 1.4 10*3/uL — ABNORMAL HIGH (ref 0.1–1.0)
Monocytes Relative: 8 %
Neutro Abs: 11.9 10*3/uL — ABNORMAL HIGH (ref 1.7–7.7)
Neutrophils Relative %: 66 %
Platelets: 393 10*3/uL (ref 150–400)
RBC: 4.28 MIL/uL (ref 3.87–5.11)
RDW: 14.3 % (ref 11.5–15.5)
WBC: 18.1 10*3/uL — ABNORMAL HIGH (ref 4.0–10.5)
nRBC: 0 % (ref 0.0–0.2)

## 2019-02-26 LAB — COMPREHENSIVE METABOLIC PANEL
ALT: 17 U/L (ref 0–44)
AST: 38 U/L (ref 15–41)
Albumin: 4.2 g/dL (ref 3.5–5.0)
Alkaline Phosphatase: 72 U/L (ref 38–126)
Anion gap: 14 (ref 5–15)
BUN: 12 mg/dL (ref 6–20)
CO2: 25 mmol/L (ref 22–32)
Calcium: 9.9 mg/dL (ref 8.9–10.3)
Chloride: 99 mmol/L (ref 98–111)
Creatinine, Ser: 0.58 mg/dL (ref 0.44–1.00)
GFR calc Af Amer: >60 mL/min (ref >60)
GFR calc non Af Amer: >60 mL/min (ref >60)
Glucose, Bld: 100 mg/dL — ABNORMAL HIGH (ref 70–99)
Potassium: 3.7 mmol/L (ref 3.5–5.1)
Sodium: 138 mmol/L (ref 135–145)
Total Bilirubin: 0.4 mg/dL (ref 0.3–1.2)
Total Protein: 8.8 g/dL — ABNORMAL HIGH (ref 6.5–8.1)

## 2019-02-26 LAB — URINALYSIS, ROUTINE W REFLEX MICROSCOPIC
Bacteria, UA: NONE SEEN
Glucose, UA: NEGATIVE mg/dL
Ketones, ur: 5 mg/dL — AB
Nitrite: NEGATIVE
Protein, ur: 100 mg/dL — AB
RBC / HPF: >50 RBC/hpf — ABNORMAL HIGH (ref 0–5)
Specific Gravity, Urine: 1.024 (ref 1.005–1.030)
WBC, UA: >50 WBC/hpf — ABNORMAL HIGH (ref 0–5)
pH: 5 (ref 5.0–8.0)

## 2019-02-26 LAB — LIPASE, BLOOD: Lipase: 35 U/L (ref 11–51)

## 2019-02-26 MED ORDER — FENTANYL CITRATE (PF) 100 MCG/2ML IJ SOLN
50.0000 ug | Freq: Once | INTRAMUSCULAR | Status: AC
  Administered 2019-02-26: 50 ug via INTRAVENOUS
  Filled 2019-02-26: qty 2

## 2019-02-26 MED ORDER — IOHEXOL 300 MG/ML  SOLN
30.0000 mL | Freq: Once | INTRAMUSCULAR | Status: AC | PRN
  Administered 2019-02-26: 30 mL via ORAL

## 2019-02-26 MED ORDER — SODIUM CHLORIDE 0.9 % IV SOLN
1.0000 g | Freq: Once | INTRAVENOUS | Status: AC
  Administered 2019-02-26: 1 g via INTRAVENOUS
  Filled 2019-02-26: qty 10

## 2019-02-26 MED ORDER — ONDANSETRON HCL 4 MG/2ML IJ SOLN
4.0000 mg | Freq: Once | INTRAMUSCULAR | Status: AC
  Administered 2019-02-26: 4 mg via INTRAVENOUS
  Filled 2019-02-26: qty 2

## 2019-02-26 MED ORDER — TRAMADOL HCL 50 MG PO TABS: 50.0000 mg | ORAL_TABLET | Freq: Four times a day (QID) | ORAL | 0 refills | Status: DC | PRN

## 2019-02-26 MED ORDER — IOHEXOL 300 MG/ML  SOLN
100.0000 mL | Freq: Once | INTRAMUSCULAR | Status: AC | PRN
  Administered 2019-02-26: 80 mL via INTRAVENOUS

## 2019-02-26 MED ORDER — SULFAMETHOXAZOLE-TRIMETHOPRIM 800-160 MG PO TABS: 1.0000 | ORAL_TABLET | Freq: Two times a day (BID) | ORAL | 0 refills | Status: AC

## 2019-02-26 NOTE — ED Notes (Signed)
Pt states she did not take her prescribed blood pressure meds yesterday or today due to abdominal pain.

## 2019-02-26 NOTE — ED Triage Notes (Signed)
Pt has a percutaneous drainage catheter placement on 03/10/2018 for perforated diverticulitis and underwent a sigmoidectomy. A new percutaneous drainage catheter on 02/13/2019 and removed last Wednesday due to a small hole in the bladder. Pt reports over the weekend she began having trouble urinating, pressure in the lower abdomen.

## 2019-02-26 NOTE — ED Provider Notes (Signed)
[facility] DEPT Provider Note   CSN: [id number] Arrival date & time: 02/26/19  1520    History   Chief Complaint Chief Complaint  Patient presents with   Abdominal Pain    HPI Christina Miller is a 53 y.o. female.     HPI Pt started having pain in her abdomen over the weekend.  She is having pain in her left lower abdomen.  She also started noticing she is urinating frequently and only going small amounts.  She is having a dark colored urine.  Pt is concerned she is developing an infection again.  No vomiting.  No fever.    Pt had diverticulitis recently and was in the hospital in July.   She had surgery on July 15th and had drain placed.  That was recently removed. Past Medical History:  Diagnosis Date   Abnormal Pap smear    cryo   Allergy    Anemia    Bone fracture    ankle   Cancer ([place]) 1987   cervical Cancer   Colonic diverticular abscess 02/12/2019   COPD (chronic obstructive pulmonary disease) (HCC)    Emphysema    Emphysema of lung (HCC)    GERD (gastroesophageal reflux disease)    Headache(784.0)    Hypertension    Infection    urinary tract infection   MS (multiple sclerosis) ([place])    Neuromuscular disorder ([place])    hands and feet face and lip and muscle weakness due to MS   Pelvic abscess in female    Sepsis due to Escherichia coli (E. 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   Intra-abdominal abscess ([place]) 03/09/2018   COPD (chronic obstructive pulmonary disease) ([place]) 03/09/2018   Diverticulitis of large intestine with perforation and abscess  02/24/2018   Protrusion of lumbar intervertebral disc 04/21/2016   Breast lump on left side at 7 o'clock position 06/22/2012   Perimenopause 06/02/2011   Hematuria, microscopic 06/02/2011    Past Surgical History:  Procedure Laterality Date   ARM WOUND REPAIR / CLOSURE     BREAST BIOPSY Left 07/03/2012   COLPOSCOPY     DILATION AND CURETTAGE OF UTERUS     IR CATHETER TUBE CHANGE  04/04/2018   IR CATHETER TUBE CHANGE  05/16/2018   IR CATHETER TUBE CHANGE  07/05/2018   IR CATHETER TUBE CHANGE  09/21/2018   IR RADIOLOGIST EVAL & MGMT  03/29/2018   IR RADIOLOGIST EVAL & MGMT  04/12/2018   IR RADIOLOGIST EVAL & MGMT  02/20/2019   JP DRAIN TUBE     PROCTOSCOPY N/A 01/30/2019   Procedure: RIGID PROCTOSCOPY;  Surgeon: [name], MD;  Location: [facility];  Service: General;  Laterality: N/A;   ROBOT ASSISTED LAPAROSCOPIC PARTIAL COLECTOMY  01/30/2019   for diverticulitis.  LSO as well   SALPINGOOPHORECTOMY Left 01/30/2019   LSO at time of sigmoid colectomy   TUBAL LIGATION       OB History    Gravida  7   Para  2   Term  1   Preterm  1   AB  5   Living  2  SAB  3   TAB  2   Ectopic      Multiple      Live Births  1            Home Medications    Prior to Admission medications   Medication Sig Start Date End Date Taking? Authorizing Provider  albuterol (PROVENTIL HFA;VENTOLIN HFA) 108 (90 Base) MCG/ACT inhaler Inhale 2 puffs into the lungs every 6 (six) hours as needed for wheezing or shortness of breath. Patient taking differently: Inhale 1 puff into the lungs every 6 (six) hours as needed for wheezing or shortness of breath.  03/26/18  Yes Ladell Pier, MD  carvedilol (COREG) 3.125 MG tablet Take 1 tablet (3.125 mg total) by mouth 2 (two) times daily with a meal. 11/21/18  Yes Ladell Pier, MD  gabapentin (NEURONTIN) 300 MG capsule Take 1 capsule (300 mg total) by mouth 3 (three) times daily. Increase to 4x/day as needed 02/04/19  Yes  Gross, Remo Lipps, MD  amoxicillin-clavulanate (AUGMENTIN) 875-125 MG tablet Take 1 tablet by mouth 2 (two) times daily. Patient not taking: Reported on 02/26/2019 02/15/19   [name], MD  sulfamethoxazole-trimethoprim (BACTRIM DS) 800-160 MG tablet Take 1 tablet by mouth 2 (two) times daily for 7 days. 02/26/19 03/05/19  Dorie Rank, MD  traMADol (ULTRAM) 50 MG tablet Take 1 tablet (50 mg total) by mouth every 6 (six) hours as needed. 02/26/19   Dorie Rank, MD    Family History Family History  Problem Relation Age of Onset   Diabetes Father    Diabetes Sister    Hypertension Sister    Diabetes Brother    Hypertension Brother    Lung cancer Mother    Colon cancer Neg Hx    Stomach cancer Neg Hx    Pancreatic cancer Neg Hx    Esophageal cancer Neg Hx    Rectal cancer Neg Hx     Social History Social History   Tobacco Use   Smoking status: Current Every Day Smoker    Packs/day: 1.00    Years: 15.00    Pack years: 15.00    Types: Cigarettes   Smokeless tobacco: Former Systems developer  Substance Use Topics   Alcohol use: Yes    Comment: occ   Drug use: No     Allergies   Hydrocodone   Review of Systems Review of Systems  All other systems reviewed and are negative.    Physical Exam Updated Vital Signs BP (!) 132/95    Pulse (!) 102    Temp 98.2 F (36.8 C) (Oral)    Resp 18    Ht 1.549 m (5' 1" )    Wt 49 kg    LMP 06/20/2012    SpO2 94%    BMI 20.41 kg/m   Physical Exam Vitals signs and nursing note reviewed.  Constitutional:      General: She is not in acute distress.    Appearance: She is well-developed.  HENT:     Head: Normocephalic and atraumatic.     Right Ear: External ear normal.     Left Ear: External ear normal.  Eyes:     General: No scleral icterus.       Right eye: No discharge.        Left eye: No discharge.     Conjunctiva/sclera: Conjunctivae normal.  Neck:     Musculoskeletal: Neck supple.     Trachea: No tracheal deviation.    Cardiovascular:  Rate and Rhythm: Normal rate and regular rhythm.  Pulmonary:     Effort: Pulmonary effort is normal. No respiratory distress.     Breath sounds: Normal breath sounds. No stridor. No wheezing or rales.  Abdominal:     General: A surgical scar is present. Bowel sounds are normal. There is no distension.     Palpations: Abdomen is soft.     Tenderness: There is abdominal tenderness in the right lower quadrant, suprapubic area and left lower quadrant. There is no guarding or rebound.     Comments: No erythema or drainage around the wounds  Musculoskeletal:        General: No tenderness.  Skin:    General: Skin is warm and dry.     Findings: No rash.  Neurological:     Mental Status: She is alert.     Cranial Nerves: No cranial nerve deficit (no facial droop, extraocular movements intact, no slurred speech).     Sensory: No sensory deficit.     Motor: No abnormal muscle tone or seizure activity.     Coordination: Coordination normal.      ED Treatments / Results  Labs (all labs ordered are listed, but only abnormal results are displayed) Labs Reviewed  COMPREHENSIVE METABOLIC PANEL - Abnormal; Notable for the following components:      Result Value   Glucose, Bld 100 (*)    Total Protein 8.8 (*)    All other components within normal limits  CBC WITH DIFFERENTIAL/PLATELET - Abnormal; Notable for the following components:   WBC 18.1 (*)    MCV 102.3 (*)    Neutro Abs 11.9 (*)    Lymphs Abs 4.2 (*)    Monocytes Absolute 1.4 (*)    All other components within normal limits  URINALYSIS, ROUTINE W REFLEX MICROSCOPIC - Abnormal; Notable for the following components:   Color, Urine AMBER (*)    APPearance CLOUDY (*)    Hgb urine dipstick MODERATE (*)    Bilirubin Urine SMALL (*)    Ketones, ur 5 (*)    Protein, ur 100 (*)    Leukocytes,Ua LARGE (*)    RBC / HPF >50 (*)    WBC, UA >50 (*)    All other components within normal limits  URINE CULTURE  LIPASE,  BLOOD    EKG None  Radiology Ct Abdomen Pelvis W Contrast  Result Date: 02/26/2019 CLINICAL DATA:  53 year old female with perforated diverticulitis underwent sigmoidectomy. Recent removal of the previously placed percutaneous drainage catheter. Patient reports having trouble urinating and lower abdominal pressure. EXAM: CT ABDOMEN AND PELVIS WITH CONTRAST TECHNIQUE: Multidetector CT imaging of the abdomen and pelvis was performed using the standard protocol following bolus administration of intravenous contrast. CONTRAST:  71m OMNIPAQUE IOHEXOL 300 MG/ML SOLN, 366mOMNIPAQUE IOHEXOL 300 MG/ML SOLN COMPARISON:  CT of the abdomen pelvis dated 02/20/2019 FINDINGS: Lower chest: The visualized lung bases are clear. No intra-abdominal free air. Probable trace free fluid within the pelvis. Hepatobiliary: Probable fatty infiltration of liver. No intrahepatic biliary ductal dilatation. No calcified gallstone. Pancreas: Unremarkable. No pancreatic ductal dilatation or surrounding inflammatory changes. Spleen: Normal in size without focal abnormality. Adrenals/Urinary Tract: The adrenal glands are unremarkable. There is no hydronephrosis on either side. There is symmetric enhancement and excretion of contrast by both kidneys. The visualized ureters appear unremarkable. The urinary bladder is predominantly collapsed. There is diffuse thickened and inflamed appearance of the bladder wall which may be reactive to inflammatory changes of the pelvis  or represent cystitis. Correlation with urinalysis recommended. There has been interval removal of the previously seen percutaneous pigtail drainage catheter. There is a 2.1 x 1.7 x 1.6 cm loculated fluid with somewhat enhancing periphery superior to the left dome of the urinary bladder consistent with residual fluid collection or abscess. A bandlike density extends from this fluid to the anterior pelvic wall corresponding to the path of the drainage catheter and may  represent an area of scarring although a track is not excluded. Correlation with clinical exam recommended to evaluate for drainage of fluid from the anterior pelvic wall. Stomach/Bowel: There is postsurgical changes of partial sigmoid resection. There is slight thickened and narrowed appearance of the lumen of the sigmoid colon, likely related to compression and mass effect by the inflammatory changes within the pelvis. There is also abutment of the sigmoid colon to the adjacent bowel loops and loculated collection superior to the bladder concerning for developing adhesions. There is no bowel obstruction. The appendix is normal. Vascular/Lymphatic: Moderate atherosclerotic calcification of the aorta. The IVC is unremarkable. No portal venous gas. There is no adenopathy. Reproductive: The uterus is anteverted and appears unremarkable. Probable nabothian cyst in the region of the cervix. No adnexal masses. Other: None Musculoskeletal: Degenerative changes of the spine. No acute osseous pathology. IMPRESSION: 1. Interval removal of the percutaneous drainage catheter. Residual 2.1 x 1.7 x 1.6 cm loculated fluid collection/abscess superior to the left dome of the urinary bladder. 2. Postsurgical changes of partial sigmoid resection. There is thickening of the sigmoid colon, likely reactive to inflammatory changes of the pelvis. In addition there is findings concerning for developing adhesion in the region of the sigmoid to the adjacent pelvic structures. 3. Thickened and inflamed appearance of the bladder wall may be reactive to inflammatory changes of the pelvis or represent cystitis. Correlation with urinalysis recommended. 4. No bowel obstruction. Normal appendix. 5. Aortic Atherosclerosis (ICD10-I70.0). Electronically Signed   By: Anner Crete M.D.   On: 02/26/2019 21:37    Procedures Procedures (including critical care time)  Medications Ordered in ED Medications  fentaNYL (SUBLIMAZE) injection 50 mcg  (50 mcg Intravenous Given 02/26/19 1813)  ondansetron (ZOFRAN) injection 4 mg (4 mg Intravenous Given 02/26/19 1813)  cefTRIAXone (ROCEPHIN) 1 g in sodium chloride 0.9 % 100 mL IVPB (0 g Intravenous Stopped 02/26/19 2150)  iohexol (OMNIPAQUE) 300 MG/ML solution 30 mL (30 mLs Oral Contrast Given 02/26/19 2101)  iohexol (OMNIPAQUE) 300 MG/ML solution 100 mL (80 mLs Intravenous Contrast Given 02/26/19 2100)     Initial Impression / Assessment and Plan / ED Course  I have reviewed the triage vital signs and the nursing notes.  Pertinent labs & imaging results that were available during my care of the patient were reviewed by me and considered in my medical decision making (see chart for details).  Clinical Course as of Feb 26 2228  Tue Feb 26, 2019  1927 White blood cell count elevated.  Urinalysis consistent with a urinary tract infection.   [JK]    Clinical Course User Index [JK] Dorie Rank, MD     Patient's laboratory tests were notable for urinary tract infection.  Patient also has worsening leukocytosis.  She has remained afebrile here in the ED.  CT scan shows persistent small fluid collection and findings of inflammation around the dome of the bladder.  She was given a dose of antibiotics for UTI.  I discussed the case with Dr. Redmond Pulling, general surgery.  He reviewed the CT scan findings.  Patient is not having any nausea or vomiting.  She is having bowel movements.  She would prefer to go home.  Dr. Redmond Pulling agrees with close outpatient follow-up.  Patient be discharged home on Bactrim considering her previous wound cultures.  Patient understands return to the ED for fevers worsening symptoms.  Final Clinical Impressions(s) / ED Diagnoses   Final diagnoses:  Acute cystitis without hematuria  Intra-abdominal abscess [facility])    ED Discharge Orders         Ordered    sulfamethoxazole-trimethoprim (BACTRIM DS) 800-160 MG tablet  2 times daily     02/26/19 2228    traMADol (ULTRAM) 50 MG  tablet  Every 6 hours PRN     02/26/19 2228           Dorie Rank, MD 02/26/19 2230

## 2019-02-26 NOTE — ED Notes (Signed)
ED Provider at bedside. 

## 2019-02-26 NOTE — Discharge Instructions (Addendum)
Take the antibiotics as prescribed.  Also take a probiotic such as activia yogurt.  Take the medications as needed for pain.  Follow-up with your general surgeon.  Return to the ED for worsening symptoms, fever, vomiting.

## 2019-02-27 MED FILL — traMADol HCL 50 MG TABS: 50 | 3 days supply | Qty: 15 | Fill #0

## 2019-02-27 MED FILL — SULFAMETHOXAZOLE-TMP DS TAB: 800-160 | 7 days supply | Qty: 14 | Fill #0

## 2019-02-28 LAB — URINE CULTURE: Culture: 20000 — AB

## 2019-03-01 ENCOUNTER — Other Ambulatory Visit (HOSPITAL_COMMUNITY): Payer: Self-pay | Admitting: Surgery

## 2019-03-01 ENCOUNTER — Telehealth: Payer: Self-pay

## 2019-03-01 DIAGNOSIS — K632 Fistula of intestine: Secondary | ICD-10-CM

## 2019-03-01 NOTE — Telephone Encounter (Signed)
Post ED Visit - Positive Culture Follow-up  Culture report reviewed by antimicrobial stewardship pharmacist: [facility] []  Elenor Quinones, Pharm.D. []  Heide Guile, Pharm.D., BCPS AQ-ID []  Parks Neptune, Pharm.D., BCPS []  Alycia Rossetti, Pharm.D., BCPS []  [place], Pharm.D., BCPS, AAHIVP []  Legrand Como, Pharm.D., BCPS, AAHIVP []  Salome Arnt, PharmD, BCPS []  Johnnette Gourd, PharmD, BCPS []  Hughes Better, PharmD, BCPS []  Leeroy Cha, PharmD []  Laqueta Linden, PharmD, BCPS []  Albertina Parr, PharmD  [place] Team []  Leodis Sias, PharmD []  Lindell Spar, PharmD []  Royetta Asal, PharmD []  Graylin Shiver, Rph []  Rema Fendt) Glennon Mac, PharmD []  Arlyn Dunning, PharmD []  Netta Cedars, PharmD []  Dia Sitter, PharmD []  Leone Haven, PharmD []  Gretta Arab, PharmD []  Theodis Shove, PharmD []  Peggyann Juba, PharmD [x]  Reuel Boom, PharmD   Positive urine culture Treated with BactrimDS, organism sensitive to the same and no further patient follow-up is required at this time.  Genia Del 03/01/2019, 11:35 AM

## 2019-03-04 ENCOUNTER — Ambulatory Visit (HOSPITAL_COMMUNITY): Payer: Medicaid Other

## 2019-03-05 ENCOUNTER — Ambulatory Visit (HOSPITAL_COMMUNITY)
Admission: RE | Admit: 2019-03-05 | Discharge: 2019-03-05 | Disposition: A | Discharge status: Home / Self Care | Payer: Medicaid Other | Source: Ambulatory Visit | Attending: Surgery | Admitting: Surgery

## 2019-03-05 ENCOUNTER — Ambulatory Visit (HOSPITAL_COMMUNITY)
Admission: RE | Admit: 2019-03-05 | Discharge: 2019-03-05 | Disposition: A | Discharge status: Home / Self Care | Payer: Medicaid Other | Source: Ambulatory Visit | Attending: Interventional Radiology | Admitting: Interventional Radiology

## 2019-03-05 ENCOUNTER — Other Ambulatory Visit (HOSPITAL_COMMUNITY): Payer: Self-pay | Admitting: Surgery

## 2019-03-05 ENCOUNTER — Encounter: Payer: Self-pay | Admitting: Surgery

## 2019-03-05 ENCOUNTER — Other Ambulatory Visit (HOSPITAL_COMMUNITY): Payer: Self-pay | Admitting: Interventional Radiology

## 2019-03-05 ENCOUNTER — Other Ambulatory Visit: Payer: Self-pay

## 2019-03-05 DIAGNOSIS — K632 Fistula of intestine: Secondary | ICD-10-CM

## 2019-03-05 MED ORDER — IOPAMIDOL (ISOVUE-300) INJECTION 61%
100.0000 mL | Freq: Once | INTRAVENOUS | Status: AC | PRN
  Administered 2019-03-05: 10:00:00 100 mL via INTRAVENOUS

## 2019-03-05 NOTE — Progress Notes (Signed)
Referring Physician(s): Dr Chauncey Cruel gross  Chief Complaint: The patient is seen in follow up today s/p diverticular abscess 02/2018 Sigmoidectomy a month or so later. New abscess development 01/2019-- IR placed drain 02/13/19 Bladder fistula development per drain injection Follow up 02/20/2019-- shows resolution and drain was removed  History of present illness:  Pt presented to ED 8/11 with urinary symptoms Found to have UTI and treated-- still has few days of antibiotics Urinary sxs better; but still with abd fullness and mild pain. Denies fever   CT 02/26/19: IMPRESSION: 1. Interval removal of the percutaneous drainage catheter. Residual 2.1 x 1.7 x 1.6 cm loculated fluid collection/abscess superior to the left dome of the urinary bladder. 2. Postsurgical changes of partial sigmoid resection. There is thickening of the sigmoid colon, likely reactive to inflammatory changes of the pelvis. In addition there is findings concerning for developing adhesion in the region of the sigmoid to the adjacent pelvic structures. 3. Thickened and inflamed appearance of the bladder wall may be reactive to inflammatory changes of the pelvis or represent cystitis. Correlation with urinalysis recommended  Scheduled for re scan today per Dr Johney Maine CT today: IMPRESSION: Percutaneous drainage catheter noted on prior exam has been removed. Small residual fluid collection remains in the left side of the pelvis which measures 2.3 x 1.5 cm, with peripheral wall enhancement suggesting possible residual abscess. The soft tissue tract of the catheter to the anterior cutaneous surface is noted, but it is not seen to fill with contrast, and therefore there is no definite evidence of patent fistula seen on this exam. Postsurgical changes are noted involving the distal sigmoid colon consistent with history of recent resection. No acute inflammation is noted in this area currently. There is no evidence of bowel  obstruction.   Past Medical History:  Diagnosis Date  . Abnormal Pap smear    cryo  . Allergy   . Anemia   . Bone fracture    ankle  . Cancer [facility]) 1987   cervical Cancer  . Colonic diverticular abscess 02/12/2019  . COPD (chronic obstructive pulmonary disease) ([place])   . Emphysema   . Emphysema of lung ([place])   . GERD (gastroesophageal reflux disease)   . Headache(784.0)   . Hypertension   . Infection    urinary tract infection  . MS (multiple sclerosis) ([place])   . Neuromuscular disorder ([place])    hands and feet face and lip and muscle weakness due to MS  . Pelvic abscess in female   . Sepsis due to Escherichia coli (E. coli) ([place]) 03/09/2018    Past Surgical History:  Procedure Laterality Date  . ARM WOUND REPAIR / CLOSURE    . BREAST BIOPSY Left 07/03/2012  . COLPOSCOPY    . DILATION AND CURETTAGE OF UTERUS    . IR CATHETER TUBE CHANGE  04/04/2018  . IR CATHETER TUBE CHANGE  05/16/2018  . IR CATHETER TUBE CHANGE  07/05/2018  . IR CATHETER TUBE CHANGE  09/21/2018  . IR RADIOLOGIST EVAL & MGMT  03/29/2018  . IR RADIOLOGIST EVAL & MGMT  04/12/2018  . IR RADIOLOGIST EVAL & MGMT  02/20/2019  . JP DRAIN TUBE    . PROCTOSCOPY N/A 01/30/2019   Procedure: RIGID PROCTOSCOPY;  Surgeon: [name], MD;  Location: [facility];  Service: General;  Laterality: N/A;  . ROBOT ASSISTED LAPAROSCOPIC PARTIAL COLECTOMY  01/30/2019   for diverticulitis.  LSO as well  . SALPINGOOPHORECTOMY Left 01/30/2019   LSO at time of  sigmoid colectomy  . TUBAL LIGATION      Allergies: Hydrocodone  Medications: Prior to Admission medications   Medication Sig Start Date End Date Taking? Authorizing Provider  albuterol (PROVENTIL HFA;VENTOLIN HFA) 108 (90 Base) MCG/ACT inhaler Inhale 2 puffs into the lungs every 6 (six) hours as needed for wheezing or shortness of breath. Patient taking differently: Inhale 1 puff into the lungs every 6 (six) hours as needed for wheezing or shortness of breath.  03/26/18    Ladell Pier, MD  amoxicillin-clavulanate (AUGMENTIN) 875-125 MG tablet Take 1 tablet by mouth 2 (two) times daily. Patient not taking: Reported on 02/26/2019 02/15/19   [name], MD  carvedilol (COREG) 3.125 MG tablet Take 1 tablet (3.125 mg total) by mouth 2 (two) times daily with a meal. 11/21/18   Ladell Pier, MD  gabapentin (NEURONTIN) 300 MG capsule Take 1 capsule (300 mg total) by mouth 3 (three) times daily. Increase to 4x/day as needed 02/04/19   [name], MD  sulfamethoxazole-trimethoprim (BACTRIM DS) 800-160 MG tablet Take 1 tablet by mouth 2 (two) times daily for 7 days. 02/26/19 03/05/19  Dorie Rank, MD  traMADol (ULTRAM) 50 MG tablet Take 1 tablet (50 mg total) by mouth every 6 (six) hours as needed. 02/26/19   Dorie Rank, MD     Family History  Problem Relation Age of Onset  . Diabetes Father   . Diabetes Sister   . Hypertension Sister   . Diabetes Brother   . Hypertension Brother   . Lung cancer Mother   . Colon cancer Neg Hx   . Stomach cancer Neg Hx   . Pancreatic cancer Neg Hx   . Esophageal cancer Neg Hx   . Rectal cancer Neg Hx     Social History   Socioeconomic History  . Marital status: Legally Separated    Spouse name: Not on file  . Number of children: Not on file  . Years of education: Not on file  . Highest education level: Not on file  Occupational History  . Not on file  Social Needs  . Financial resource strain: Not on file  . Food insecurity    Worry: Not on file    Inability: Not on file  . Transportation needs    Medical: Not on file    Non-medical: Not on file  Tobacco Use  . Smoking status: Current Every Day Smoker    Packs/day: 1.00    Years: 15.00    Pack years: 15.00    Types: Cigarettes  . Smokeless tobacco: Former Network engineer and Sexual Activity  . Alcohol use: Yes    Comment: occ  . Drug use: No  . Sexual activity: Yes    Birth control/protection: Surgical  Lifestyle  . Physical activity    Days per  week: Not on file    Minutes per session: Not on file  . Stress: Not on file  Relationships  . Social Herbalist on phone: Not on file    Gets together: Not on file    Attends religious service: Not on file    Active member of club or organization: Not on file    Attends meetings of clubs or organizations: Not on file    Relationship status: Not on file  Other Topics Concern  . Not on file  Social History Narrative   ** Merged History Encounter **         Vital Signs: LMP 06/20/2012  Physical Exam Vitals signs reviewed.  Abdominal:     Palpations: Abdomen is soft.     Tenderness: There is abdominal tenderness.  Skin:    General: Skin is warm and dry.     Comments: No signs of infection  Neurological:     Mental Status: She is alert and oriented to person, place, and time.  Psychiatric:        Behavior: Behavior normal.     Imaging: Ct Pelvis W Contrast  Result Date: 03/05/2019 CLINICAL DATA:  Colocutaneous fistula. EXAM: CT PELVIS WITH CONTRAST TECHNIQUE: Multidetector CT imaging of the pelvis was performed using the standard protocol following the bolus administration of intravenous contrast. CONTRAST:  154m ISOVUE-300 IOPAMIDOL (ISOVUE-300) INJECTION 61% COMPARISON:  CT scan of February 26, 2019. FINDINGS: Urinary Tract: Urinary bladder is decompressed, and therefore it is difficult to evaluate for wall thickening. No ureteral dilatation or calculi are noted. Bowel: Status post distal sigmoid colon resection. There is persistent narrowing at the anastomosis. No definite evidence of bowel obstruction or acute inflammation is noted. The appendix appears normal. Vascular/Lymphatic: Atherosclerosis of visualized portion of abdominal aorta is noted without aneurysm formation. No significant adenopathy is noted. Reproductive: Uterus is unremarkable. No adnexal mass is noted. Stable probable nabothian cyst is noted in the cervix. Other: Percutaneous drainage catheter  noted on prior exam has been removed. Small residual fluid collection measuring 2.3 x 1.5 cm is noted with wall enhancement which may represent residual abscess. Soft tissue density remains in this area extending to the anterior cutaneous surface consistent with prior catheter tract. However, it is not seen to definitively fill with contrast to suggest patent fistula. Musculoskeletal: Degenerative disc disease is seen involving the visualized portion of the lower lumbar spine. No acute osseous abnormality is noted. IMPRESSION: Percutaneous drainage catheter noted on prior exam has been removed. Small residual fluid collection remains in the left side of the pelvis which measures 2.3 x 1.5 cm, with peripheral wall enhancement suggesting possible residual abscess. The soft tissue tract of the catheter to the anterior cutaneous surface is noted, but it is not seen to fill with contrast, and therefore there is no definite evidence of patent fistula seen on this exam. Postsurgical changes are noted involving the distal sigmoid colon consistent with history of recent resection. No acute inflammation is noted in this area currently. There is no evidence of bowel obstruction. The bladder is decompressed, and therefore evaluation for bladder wall thickening could not be performed. Aortic Atherosclerosis (ICD10-I70.0). Electronically Signed   By: JMarijo ConceptionM.D.   On: 03/05/2019 10:22    Labs:  CBC: Recent Labs    02/04/19 0317 02/12/19 2013 02/13/19 0451 02/26/19 1730  WBC 10.1 16.6* 12.8* 18.1*  HGB 9.4* 11.1* 10.6* 14.3  HCT 30.1* 34.6* 33.4* 43.8  PLT 301 738* 666* 393    COAGS: Recent Labs    03/10/18 0620 04/03/18 1258 02/13/19 1006  INR 0.96 0.87 1.0  APTT 26 24  --     BMP: Recent Labs    02/03/19 0834 02/12/19 2013 02/13/19 0451 02/26/19 1730  NA 134* 128* 132* 138  K 3.7 3.6 4.3 3.7  CL 92* 90* 94* 99  CO2 28 25 28 25   GLUCOSE 102* 94 93 100*  BUN 8 7 5* 12  CALCIUM 8.5*  8.5* 8.4* 9.9  CREATININE 0.49 0.52 0.57 0.58  GFRNONAA >60 >60 >60 >60  GFRAA >60 >60 >60 >60    LIVER FUNCTION TESTS: Recent Labs  03/26/18 1026 03/29/18 1140 02/12/19 2013 02/26/19 1730  BILITOT 0.3 0.3 0.7 0.4  AST 24 15 24  38  ALT 20 17 12 17   ALKPHOS 49 41 80 72  PROT 6.7 7.2 7.0 8.8*  ALBUMIN 4.1 4.0 3.0* 4.2    Assessment:  Dr Earleen Newport discussed results of scan with pt and boyfriend today. Collection is smaller today. Pt is to see Dr Johney Maine at 245 pm today. Plan -  Finish antibiotics IR here to help if need Pt has good understanding of plan  Signed: Lavonia Drafts, PA-C 03/05/2019, 10:48 AM   Please refer to Dr. Earleen Newport attestation of this note for management and plan.

## 2019-08-12 ENCOUNTER — Ambulatory Visit: Payer: Medicaid Other | Admitting: Internal Medicine

## 2019-08-19 ENCOUNTER — Ambulatory Visit: Payer: Medicaid Other | Admitting: Gastroenterology

## 2019-08-20 ENCOUNTER — Ambulatory Visit: Payer: Medicaid Other | Admitting: Internal Medicine

## 2019-08-22 ENCOUNTER — Other Ambulatory Visit: Payer: Self-pay

## 2019-08-22 ENCOUNTER — Ambulatory Visit: Payer: Medicaid Other | Attending: Internal Medicine | Admitting: Internal Medicine

## 2019-08-22 VITALS — BP 166/108 | HR 109 | Temp 97.5°F | Resp 16 | Ht 62.0 in | Wt 103.4 lb

## 2019-08-22 DIAGNOSIS — D649 Anemia, unspecified: Secondary | ICD-10-CM | POA: Insufficient documentation

## 2019-08-22 DIAGNOSIS — I10 Essential (primary) hypertension: Secondary | ICD-10-CM | POA: Insufficient documentation

## 2019-08-22 DIAGNOSIS — F17210 Nicotine dependence, cigarettes, uncomplicated: Secondary | ICD-10-CM | POA: Diagnosis not present

## 2019-08-22 DIAGNOSIS — R634 Abnormal weight loss: Secondary | ICD-10-CM | POA: Insufficient documentation

## 2019-08-22 DIAGNOSIS — F17200 Nicotine dependence, unspecified, uncomplicated: Secondary | ICD-10-CM | POA: Diagnosis not present

## 2019-08-22 DIAGNOSIS — G35 Multiple sclerosis: Secondary | ICD-10-CM | POA: Diagnosis not present

## 2019-08-22 DIAGNOSIS — J449 Chronic obstructive pulmonary disease, unspecified: Secondary | ICD-10-CM | POA: Insufficient documentation

## 2019-08-22 DIAGNOSIS — R200 Anesthesia of skin: Secondary | ICD-10-CM | POA: Diagnosis not present

## 2019-08-22 DIAGNOSIS — Z79899 Other long term (current) drug therapy: Secondary | ICD-10-CM | POA: Insufficient documentation

## 2019-08-22 DIAGNOSIS — I8392 Asymptomatic varicose veins of left lower extremity: Secondary | ICD-10-CM | POA: Diagnosis not present

## 2019-08-22 MED ORDER — AMLODIPINE BESYLATE 5 MG PO TABS: 5.0000 mg | ORAL_TABLET | Freq: Every day | ORAL | 3 refills | Status: DC

## 2019-08-22 MED ORDER — ALBUTEROL SULFATE HFA 108 (90 BASE) MCG/ACT IN AERS: 2.0000 | INHALATION_SPRAY | Freq: Four times a day (QID) | RESPIRATORY_TRACT | 3 refills | Status: DC | PRN

## 2019-08-22 NOTE — Patient Instructions (Signed)
Please give patient an appointment with the clinical pharmacist in 2 weeks for repeat blood pressure check.  I have referred you to the neurologist.

## 2019-08-22 NOTE — Progress Notes (Signed)
Patient ID: Christina Miller, female    DOB: Aug 31, 1965  MRN: [id number]  CC: Follow-up (3 months )   Subjective: Christina Miller is a 54 y.o. 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  Complicated diverticular Ds: Since last visit with me in May of last year she had 1.5 ft of colon  and LT ovary removed 01/2019 Doing well "Trying to build body mass back up."  Eats 2 meals a day and a snack.  Wants to get to 110-115 lbs.  Drinks Ensure.    Legs and feet get hot and cold x few mths Feels some lumps lateral lower LT leg x few mths.  No pain.  She wonders if it is a blood clot.  HTN:  Stopped Norvasc a while back No CP, LE edema.  No dyspnea on exertion.    MS:  Has numbness in hands, face and legs.  She thinks it is due to her multiple sclerosis.  Now that she has Medicaid she would like to move forward with referral to see a neurologist.  Tob Dep: 1/2-1 pk a day Has patches but not ready to quit yet.   Drinks whiskey a few times a wk. Drinks 1/2 pint about 2 x a wk.  Under some stress.  Son's father died in 04-May-2019.  Also stress about her wgh  COPD:  Needs RF on Albuterol. Last used in November.   Breathing problems flare in cold weather and when the heat is on too high in her house.  Patient Active Problem List   Diagnosis Date Noted  . Colonic diverticular abscess 02/13/2019  . Anxiety state 01/30/2019  . Tubular adenoma of colon 07/02/2018  . Financial difficulties 06/26/2018  . Tobacco dependence 03/26/2018  . Essential hypertension 03/26/2018  . Multiple sclerosis ([place]) 03/26/2018  . Anemia 03/26/2018  . 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 Intra-abdominal abscess ([place]) 03/09/2018  . COPD (chronic obstructive pulmonary disease) ([place]) 03/09/2018  . Diverticulitis of large intestine with perforation  and abscess 02/24/2018  . Protrusion of lumbar intervertebral disc 04/21/2016  . Breast lump on left side at 7 o'clock position 06/22/2012  . Perimenopause 06/02/2011  . Hematuria, microscopic 06/02/2011     Current Outpatient Medications on File Prior to Visit  Medication Sig Dispense Refill  . carvedilol (COREG) 3.125 MG tablet Take 1 tablet (3.125 mg total) by mouth 2 (two) times daily with a meal. (Patient not taking: Reported on 08/22/2019) 60 tablet 3   No current facility-administered medications on file prior to visit.    Allergies  Allergen Reactions  . Hydrocodone Nausea And Vomiting  . Bactrim [Sulfamethoxazole-Trimethoprim] Rash    Social History   Socioeconomic History  . Marital status: Legally Separated    Spouse name: Not on file  . Number of children: Not on file  . Years of education: Not on file  . Highest education level: Not on file  Occupational History  . Not on file  Tobacco Use  . Smoking status: Current Every Day Smoker    Packs/day: 1.00    Years: 15.00    Pack years: 15.00    Types: Cigarettes  . Smokeless tobacco: Former Network engineer and Sexual Activity  . Alcohol use: Yes    Comment: occ  . Drug use: No  . Sexual activity: Yes    Birth control/protection: Surgical  Other Topics Concern  . Not on file  Social History Narrative   ** Merged History Encounter **       Social Determinants of Health   Financial Resource Strain:   . Difficulty of Paying Living Expenses: Not on file  Food Insecurity:   . Worried About Charity fundraiser in the Last Year: Not on file  . Ran Out of Food in the Last Year: Not on file  Transportation Needs:   . Lack of Transportation (Medical): Not on file  . Lack of Transportation (Non-Medical): Not on file  Physical Activity:   . Days of Exercise per Week: Not on file  . Minutes of Exercise per Session: Not on file  Stress:   . Feeling of Stress : Not on file  Social Connections:   . Frequency of  Communication with Friends and Family: Not on file  . Frequency of Social Gatherings with Friends and Family: Not on file  . Attends Religious Services: Not on file  . Active Member of Clubs or Organizations: Not on file  . Attends Archivist Meetings: Not on file  . Marital Status: Not on file  Intimate Partner Violence:   . Fear of Current or Ex-Partner: Not on file  . Emotionally Abused: Not on file  . Physically Abused: Not on file  . Sexually Abused: Not on file    Family History  Problem Relation Age of Onset  . Diabetes Father   . Diabetes Sister   . Hypertension Sister   . Diabetes Brother   . Hypertension Brother   . Lung cancer Mother   . Colon cancer Neg Hx   . Stomach cancer Neg Hx   . Pancreatic cancer Neg Hx   . Esophageal cancer Neg Hx   . Rectal cancer Neg Hx     Past Surgical History:  Procedure Laterality Date  . ARM WOUND REPAIR / CLOSURE    . BREAST BIOPSY Left 07/03/2012  . COLPOSCOPY    . DILATION AND CURETTAGE OF UTERUS    . IR CATHETER TUBE CHANGE  04/04/2018  . IR CATHETER TUBE CHANGE  05/16/2018  . IR CATHETER TUBE CHANGE  07/05/2018  . IR CATHETER TUBE CHANGE  09/21/2018  . IR RADIOLOGIST EVAL & MGMT  03/29/2018  . IR RADIOLOGIST EVAL & MGMT  04/12/2018  . IR RADIOLOGIST EVAL & MGMT  02/20/2019  . JP DRAIN TUBE    . PROCTOSCOPY N/A 01/30/2019   Procedure: RIGID PROCTOSCOPY;  Surgeon: [name], MD;  Location: [facility];  Service: General;  Laterality: N/A;  . ROBOT ASSISTED LAPAROSCOPIC PARTIAL COLECTOMY  01/30/2019   for diverticulitis.  LSO as well  . SALPINGOOPHORECTOMY Left 01/30/2019   LSO at time of sigmoid colectomy  . TUBAL LIGATION      ROS: Review of Systems Negative except as stated above  PHYSICAL EXAM: BP (!) 166/108   Pulse (!) 109   Temp (!) 97.5 F (36.4 C)   Resp 16   Ht 5' 2"  (1.575 m)   Wt 103 lb 6.4 oz (46.9 kg)   LMP 06/20/2012   SpO2 96%   BMI 18.91 kg/m   Wt Readings from Last 3 Encounters:    08/22/19 103 lb 6.4 oz (46.9 kg)  02/26/19 108 lb (49 kg)  02/13/19 106 lb 11.2 oz (48.4 kg)    Physical Exam  General appearance - alert, well appearing, and in no distress Mental status - normal mood, behavior, speech, dress,  motor activity, and thought processes Eyes - pupils equal and reactive, extraocular eye movements intact Neck - supple, no significant adenopathy Chest - clear to auscultation, no wheezes, rales or rhonchi, symmetric air entry Heart - normal rate, regular rhythm, normal S1, S2, no murmurs, rubs, clicks or gallops Extremities -no lower extremity edema.  Feet warm.  Good dorsalis pedis and posterior tibialis pulses.  Good capillary reflex on the toenails.  She does have several areas of bulging varicosities more so on the left leg.  No tenderness of these veins on palpation   CMP Latest Ref Rng & Units 02/26/2019 02/13/2019 02/12/2019  Glucose 70 - 99 mg/dL 100(H) 93 94  BUN 6 - 20 mg/dL 12 5(L) 7  Creatinine 0.44 - 1.00 mg/dL 0.58 0.57 0.52  Sodium 135 - 145 mmol/L 138 132(L) 128(L)  Potassium 3.5 - 5.1 mmol/L 3.7 4.3 3.6  Chloride 98 - 111 mmol/L 99 94(L) 90(L)  CO2 22 - 32 mmol/L 25 28 25   Calcium 8.9 - 10.3 mg/dL 9.9 8.4(L) 8.5(L)  Total Protein 6.5 - 8.1 g/dL 8.8(H) - 7.0  Total Bilirubin 0.3 - 1.2 mg/dL 0.4 - 0.7  Alkaline Phos 38 - 126 U/L 72 - 80  AST 15 - 41 U/L 38 - 24  ALT 0 - 44 U/L 17 - 12   Lipid Panel     Component Value Date/Time   CHOL 199 03/26/2018 1026   TRIG 98 03/26/2018 1026   HDL 140 03/26/2018 1026   CHOLHDL 1.4 03/26/2018 1026   LDLCALC 39 03/26/2018 1026    CBC    Component Value Date/Time   WBC 18.1 (H) 02/26/2019 1730   RBC 4.28 02/26/2019 1730   HGB 14.3 02/26/2019 1730   HGB 12.3 03/26/2018 1026   HCT 43.8 02/26/2019 1730   HCT 36.7 03/26/2018 1026   PLT 393 02/26/2019 1730   PLT 285 03/26/2018 1026   MCV 102.3 (H) 02/26/2019 1730   MCV 92 03/26/2018 1026   MCV 101 (H) 03/19/2014 1829   MCH 33.4 02/26/2019 1730    MCHC 32.6 02/26/2019 1730   RDW 14.3 02/26/2019 1730   RDW 13.5 03/26/2018 1026   RDW 12.6 03/19/2014 1829   LYMPHSABS 4.2 (H) 02/26/2019 1730   MONOABS 1.4 (H) 02/26/2019 1730   EOSABS 0.4 02/26/2019 1730   BASOSABS 0.1 02/26/2019 1730    ASSESSMENT AND PLAN:  1. Essential hypertension Not at goal.  Restart amlodipine.  Encourage compliance.  Discussed health risks associated with uncontrolled blood pressure.  Follow-up with clinical pharmacist in 2 weeks for repeat blood pressure check.  DASH diet discussed and encouraged - amLODipine (NORVASC) 5 MG tablet; Take 1 tablet (5 mg total) by mouth daily.  Dispense: 30 tablet; Refill: 3 - Lipid panel  2. Chronic obstructive pulmonary disease, unspecified COPD type ([place]) Stable.  Refill on albuterol given to use as needed. - albuterol (VENTOLIN HFA) 108 (90 Base) MCG/ACT inhaler; Inhale 2 puffs into the lungs every 6 (six) hours as needed for wheezing or shortness of breath.  Dispense: 8 g; Refill: 3  3. Tobacco dependence Advised to quit.  Discussed health risks associated with smoking.  Patient not ready to give a trial of quitting  4. Multiple sclerosis ([place]) - Ambulatory referral to Neurology  5. Weight loss Advised patient that she is not overweight.  If she wants to gain 10 pounds she will still be within normal weight range for her height but I would not advise trying to gain 20  pounds.  We discussed having her try high-protein shakes like Atkins to supplement meals - TSH  6. Asymptomatic varicose veins of left lower extremity Discussed diagnosis and because of varicose veins.  At this time she is not having any symptoms from them so we will monitor for now    Patient was given the opportunity to ask questions.  Patient verbalized understanding of the plan and was able to repeat key elements of the plan.   Orders Placed This Encounter  Procedures  . TSH  . Lipid panel  . Ambulatory referral to Neurology      Requested Prescriptions   Signed Prescriptions Disp Refills  . albuterol (VENTOLIN HFA) 108 (90 Base) MCG/ACT inhaler 8 g 3    Sig: Inhale 2 puffs into the lungs every 6 (six) hours as needed for wheezing or shortness of breath.  Marland Kitchen amLODipine (NORVASC) 5 MG tablet 30 tablet 3    Sig: Take 1 tablet (5 mg total) by mouth daily.    Return in about 3 months (around 11/19/2019).  Karle Plumber, MD, FACP

## 2019-08-23 LAB — LIPID PANEL
Chol/HDL Ratio: 1.7 ratio (ref 0.0–4.4)
Cholesterol, Total: 230 mg/dL — ABNORMAL HIGH (ref 100–199)
HDL: 139 mg/dL (ref >39)
LDL Chol Calc (NIH): 80 mg/dL (ref 0–99)
Triglycerides: 63 mg/dL (ref 0–149)
VLDL Cholesterol Cal: 11 mg/dL (ref 5–40)

## 2019-08-23 LAB — TSH: TSH: 0.981 u[IU]/mL (ref 0.450–4.500)

## 2019-09-05 ENCOUNTER — Ambulatory Visit: Payer: Medicaid Other | Admitting: Pharmacist

## 2019-09-11 ENCOUNTER — Other Ambulatory Visit: Payer: Self-pay

## 2019-09-11 ENCOUNTER — Encounter: Payer: Self-pay | Admitting: Neurology

## 2019-09-11 ENCOUNTER — Telehealth: Payer: Self-pay | Admitting: Neurology

## 2019-09-11 ENCOUNTER — Ambulatory Visit (INDEPENDENT_AMBULATORY_CARE_PROVIDER_SITE_OTHER): Payer: Medicaid Other | Admitting: Neurology

## 2019-09-11 VITALS — BP 110/80 | HR 128 | Temp 97.7°F | Ht 62.0 in | Wt 103.8 lb

## 2019-09-11 DIAGNOSIS — R200 Anesthesia of skin: Secondary | ICD-10-CM | POA: Diagnosis not present

## 2019-09-11 DIAGNOSIS — R9082 White matter disease, unspecified: Secondary | ICD-10-CM | POA: Insufficient documentation

## 2019-09-11 DIAGNOSIS — Z720 Tobacco use: Secondary | ICD-10-CM | POA: Diagnosis not present

## 2019-09-11 DIAGNOSIS — H539 Unspecified visual disturbance: Secondary | ICD-10-CM

## 2019-09-11 DIAGNOSIS — I10 Essential (primary) hypertension: Secondary | ICD-10-CM

## 2019-09-11 NOTE — Progress Notes (Signed)
[facility]  PATIENT: Christina Miller DOB: [birth year]  REFERRING DOCTOR OR PCP:   Dr. Karle Plumber SOURCE: patient, notes from PCP, imaging reports, MRI images personally reviewed.   _________________________________   HISTORICAL  CHIEF COMPLAINT:  Chief Complaint  Patient presents with  . New Patient (Initial Visit)    RM 13, alone. Internal referral from Karle Plumber, MD (PCP) for MS. No previous records in centricity found per [place].   . Multiple Sclerosis    Dx in 2003/2004 by Mylinda Latina, MD (he has since passed away). Never been on DMT. Vision started getting worse last year. Hard to focus on TV/phone. Has not been to eye doctor recently, cannot remember last time she went. She is having numbness all over intermittently.     HISTORY OF PRESENT ILLNESS:  I had the pleasure of seeing patient, Christina Miller, at [facility] for neurologic consultation regarding her abnormal neuroimaging studies  She is a 54 year old woman who was diagnosed with MS in the past.    She was having spells of vertigo in 2006.   She had a CT scan that showed white matter lesions and then had an MRI of the brain that was felt to be suggestive of MS.   She was diagnosed with MS by Dr Monia Pouch (family medicine).  She apparently had a couple visits at [facility] but we do not have those records and additional MRIs were performed.  She did not have a lumbar puncture.Marland Kitchen    She was never placed on any DMTs.      Over the last 15 years, she has had frequent episodes of hand numbness and leg numbness and also has had many bouts of vertigo.    The episodes of numbness and vertigo were fleeting not consistent with typical time course of an MS exacerbation.  She has never had a classic exacerbation.  Currently, she feels her gait does well but she gets tightness in her legs when she walks longer distances.   She denies any recent episodes of  dizziness,   She continues to note paresthesias.    She gets a hot and cold sensation in her legs.   She does not have weakness in her legs but notes reduced grip.    Bladder function is fine.    She notes blurry vision and diplopia at times, especially when looking at phone  She has had a partial colectomy due to diverticulitis.  Crohn's disease was first suspected but reportedly ruled out.     I personally reviewed the MRIs of the brain dated 03/18/2005 and the MRI of the cervical and thoracic spine from 04/14/2005.  The MRI of the brain shows multiple T2/FLAIR hyperintense foci, predominantly in the subcortical and deep white matter.  There are a few periventricular foci but many more pericallosal foci.  There were no lesions in the infratentorial white matter.  These foci are generally nonspecific and could represent chronic microvascular ischemic change though demyelination cannot be ruled out.  The MRI of the spinal cord did not show any demyelinating plaque and just minimal degenerative changes.  I also reviewed his recent CT scan from 2017.  It did show some more significant white matter changes compared to the previous MRI.  There is no atrophy.  Vascular risks:  She has HTN and smokes (1/2-1 ppd; started age 23 most was 1 ppd)  REVIEW OF SYSTEMS: Constitutional: No fevers, chills, sweats, or change in appetite.  She has some insomnia. Eyes: No visual changes, double vision, eye pain Ear, nose and throat: No hearing loss, ear pain, nasal congestion, sore throat Cardiovascular: No chest pain, palpitations Respiratory: No shortness of breath at rest or with exertion.   No wheezes GastrointestinaI: Has had hemicolectomy due to diverticulitis. Genitourinary: No dysuria, urinary retention or frequency.  No nocturia. Musculoskeletal:Denies neck pain.  She has some back pain.  Integumentary: No rash, pruritus, skin lesions Neurological: as above Psychiatric: No depression at this time.  No  anxiety Endocrine: No palpitations, diaphoresis, change in appetite, change in weigh or increased thirst Hematologic/Lymphatic: No anemia, purpura, petechiae. Allergic/Immunologic: No itchy/runny eyes, nasal congestion, recent allergic reactions, rashes  ALLERGIES: Allergies  Allergen Reactions  . Hydrocodone Nausea And Vomiting  . Bactrim [Sulfamethoxazole-Trimethoprim] Rash    HOME MEDICATIONS:  Current Outpatient Medications:  .  albuterol (VENTOLIN HFA) 108 (90 Base) MCG/ACT inhaler, Inhale 2 puffs into the lungs every 6 (six) hours as needed for wheezing or shortness of breath., Disp: 8 g, Rfl: 3 .  amLODipine (NORVASC) 5 MG tablet, Take 1 tablet (5 mg total) by mouth daily., Disp: 30 tablet, Rfl: 3 .  ibuprofen (ADVIL) 200 MG tablet, Take 200 mg by mouth every 6 (six) hours as needed., Disp: , Rfl:   PAST MEDICAL HISTORY: Past Medical History:  Diagnosis Date  . Abnormal Pap smear    cryo  . Allergy   . Anemia   . Bone fracture    ankle  . Cancer [facility]) 1987   cervical Cancer  . Colonic diverticular abscess 02/12/2019  . COPD (chronic obstructive pulmonary disease) ([place])   . Emphysema   . Emphysema of lung ([place])   . GERD (gastroesophageal reflux disease)   . Headache(784.0)   . Hypertension   . Infection    urinary tract infection  . MS (multiple sclerosis) ([place])   . Neuromuscular disorder ([place])    hands and feet face and lip and muscle weakness due to MS  . Pelvic abscess in female   . Sepsis due to Escherichia coli (E. coli) ([place]) 03/09/2018    PAST SURGICAL HISTORY: Past Surgical History:  Procedure Laterality Date  . ARM WOUND REPAIR / CLOSURE    . BREAST BIOPSY Left 07/03/2012  . COLPOSCOPY    . DILATION AND CURETTAGE OF UTERUS    . IR CATHETER TUBE CHANGE  04/04/2018  . IR CATHETER TUBE CHANGE  05/16/2018  . IR CATHETER TUBE CHANGE  07/05/2018  . IR CATHETER TUBE CHANGE  09/21/2018  . IR RADIOLOGIST EVAL & MGMT  03/29/2018  . IR RADIOLOGIST EVAL & MGMT   04/12/2018  . IR RADIOLOGIST EVAL & MGMT  02/20/2019  . JP DRAIN TUBE    . PROCTOSCOPY N/A 01/30/2019   Procedure: RIGID PROCTOSCOPY;  Surgeon: [name], MD;  Location: [facility];  Service: General;  Laterality: N/A;  . ROBOT ASSISTED LAPAROSCOPIC PARTIAL COLECTOMY  01/30/2019   for diverticulitis.  LSO as well  . SALPINGOOPHORECTOMY Left 01/30/2019   LSO at time of sigmoid colectomy  . TUBAL LIGATION      FAMILY HISTORY: Family History  Problem Relation Age of Onset  . Diabetes Father   . Diabetes Sister   . Hypertension Sister   . Diabetes Brother   . Hypertension Brother   . Lung cancer Mother   . Colon cancer Neg Hx   . Stomach cancer Neg Hx   . Pancreatic cancer Neg Hx   . Esophageal cancer Neg  Hx   . Rectal cancer Neg Hx     SOCIAL HISTORY:  Social History   Socioeconomic History  . Marital status: Legally Separated    Spouse name: Not on file  . Number of children: 2  . Years of education: 9  . Highest education level: Not on file  Occupational History  . Occupation: unemployed  Tobacco Use  . Smoking status: Current Every Day Smoker    Packs/day: 1.00    Years: 15.00    Pack years: 15.00    Types: Cigarettes  . Smokeless tobacco: Former Network engineer and Sexual Activity  . Alcohol use: Yes    Comment: once or twice per week  . Drug use: No  . Sexual activity: Yes    Birth control/protection: Surgical  Other Topics Concern  . Not on file  Social History Narrative    Right handed    Caffeine use: coffee every morning (1 cup)   Pepsi   Lives with boyfriend, Virl Cagey   Social Determinants of Health   Financial Resource Strain:   . Difficulty of Paying Living Expenses: Not on file  Food Insecurity:   . Worried About Charity fundraiser in the Last Year: Not on file  . Ran Out of Food in the Last Year: Not on file  Transportation Needs:   . Lack of Transportation (Medical): Not on file  . Lack of Transportation (Non-Medical): Not on file    Physical Activity:   . Days of Exercise per Week: Not on file  . Minutes of Exercise per Session: Not on file  Stress:   . Feeling of Stress : Not on file  Social Connections:   . Frequency of Communication with Friends and Family: Not on file  . Frequency of Social Gatherings with Friends and Family: Not on file  . Attends Religious Services: Not on file  . Active Member of Clubs or Organizations: Not on file  . Attends Archivist Meetings: Not on file  . Marital Status: Not on file  Intimate Partner Violence:   . Fear of Current or Ex-Partner: Not on file  . Emotionally Abused: Not on file  . Physically Abused: Not on file  . Sexually Abused: Not on file     PHYSICAL EXAM  Vitals:   09/11/19 1320  BP: 110/80  Pulse: (!) 128  Temp: 97.7 F (36.5 C)  SpO2: 96%  Weight: 103 lb 12.8 oz (47.1 kg)  Height: 5' 2"  (1.575 m)    Body mass index is 18.99 kg/m.   General: The patient is well-developed and well-nourished and in no acute distress  HEENT:  Head is [place]/AT.  Sclera are anicteric.  Funduscopic exam shows normal optic discs and retinal vessels.  Mild cataracts.  Neck: No carotid bruits are noted.  The neck is nontender.  Cardiovascular: The heart has a regular rate and rhythm with a normal S1 and S2. There were no murmurs, gallops or rubs.    Skin: Extremities are without rash or  edema.  Neurologic Exam  Mental status: The patient is alert and oriented x 3 at the time of the examination. The patient has apparent normal recent and remote memory, with an apparently normal attention span and concentration ability.   Speech is normal.  Cranial nerves: Extraocular movements are full. Pupils are equal, round, and reactive to light and accomodation.  Visual fields are full.  Facial symmetry is present. There is good facial sensation to soft touch  bilaterally.Facial strength is normal.  Trapezius and sternocleidomastoid strength is normal. No dysarthria is  noted.  The tongue is midline, and the patient has symmetric elevation of the soft palate. No obvious hearing deficits are noted.  Motor:  Muscle bulk is normal.   Tone is normal. Strength is  5 / 5 in all 4 extremities.   Sensory: Sensory testing is intact to pinprick, soft touch and vibration sensation in all 4 extremities.  Coordination: Cerebellar testing reveals good finger-nose-finger and heel-to-shin bilaterally.  Gait and station: Station is normal.   Gait is normal. Tandem gait is mildly wide. Romberg is negative.   Reflexes: Deep tendon reflexes are symmetric and normal bilaterally.   Plantar responses are flexor.    DIAGNOSTIC DATA (LABS, IMAGING, TESTING) - I reviewed patient records, labs, notes, testing and imaging myself where available.  Lab Results  Component Value Date   WBC 18.1 (H) 02/26/2019   HGB 14.3 02/26/2019   HCT 43.8 02/26/2019   MCV 102.3 (H) 02/26/2019   PLT 393 02/26/2019    Lab Results  Component Value Date   CHOL 230 (H) 08/22/2019   HDL 139 08/22/2019   LDLCALC 80 08/22/2019   TRIG 63 08/22/2019   CHOLHDL 1.7 08/22/2019   Lab Results  Component Value Date   HGBA1C 4.9 01/24/2019   Lab Results  Component Value Date   VITAMINB12 377 03/05/2018   Lab Results  Component Value Date   TSH 0.981 08/22/2019       ASSESSMENT AND PLAN  White matter abnormality on MRI of brain - Plan: MR BRAIN W WO CONTRAST, MR CERVICAL SPINE W WO CONTRAST, Visual evoked potential test  Numbness - Plan: MR BRAIN W WO CONTRAST, MR CERVICAL SPINE W WO CONTRAST  Vision disturbance - Plan: Visual evoked potential test   In summary, Ms. Picone is a 54 year old woman who has abnormal brain imaging studies with white matter foci.  She was diagnosed with multiple sclerosis in 2006 but never placed on a disease modifying therapy.  Current examination is fairly normal.  By my review of the 2006 MRI, the white matter abnormalities are more consistent with  chronic microvascular ischemic change than demyelination though she could have both processes.  She has vascular risk factors of smoking and hypertension.  She has never had a definite exacerbation and most of her neurologic symptoms or repeated episodes of vertigo and numbness that lasts for minutes.  This would not be a typical MS time course.  Potentially worrisome, however, the CT scan does show some progression over time.  However,, most patients with MS for many years would have atrophy which was not seen on the most recent CT scan.  We will request MRI of the brain and cervical spine to determine the degree of progression over time and compare the study with the 2006 MRI.  If foci still appear more consistent with chronic microvascular ischemic change, then controlled with vascular risk factors of smoking and hypertension would be of most benefit.  However, if there has been progression more consistent with MS we will need to consider further evaluation with lumbar puncture or, if progression is more classic and there are some enhancing foci, begin a disease modifying therapy.  We will also check a visual evoked potential to determine if there is slowing along the visual pathways  I have advised her to try to quit smoking.  She reports that she has received patches for smoking cessation from primary care but has  never used them.  I will see her back for regular follow-up in 3 months but will have her come back sooner if we begin a disease modifying therapy.  She should call us if she has new or worsening neurologic symptoms.     Rahsaan Weakland A. Felecia Shelling, MD, [facility] 5/74/7340, 3:70 PM Certified in Neurology, Clinical Neurophysiology, Sleep Medicine and Neuroimaging  [facility] [address], [place], [place] [address] [phone]

## 2019-09-11 NOTE — Telephone Encounter (Signed)
Medicaid order sent to GI. They will obtain the auth and reach out to the patient to schedule.  

## 2019-09-12 ENCOUNTER — Telehealth: Payer: Self-pay | Admitting: Neurology

## 2019-09-12 NOTE — Telephone Encounter (Signed)
I called patient and LVM to schedule Visual Evoked Potential testing. Requested patient call back to schedule.

## 2019-09-17 ENCOUNTER — Ambulatory Visit: Payer: Medicaid Other

## 2019-09-17 ENCOUNTER — Other Ambulatory Visit: Payer: Self-pay

## 2019-09-19 ENCOUNTER — Ambulatory Visit (HOSPITAL_COMMUNITY)
Admission: EM | Admit: 2019-09-19 | Discharge: 2019-09-19 | Disposition: A | Discharge status: Home / Self Care | Payer: Medicaid Other | Attending: Physician Assistant | Admitting: Physician Assistant

## 2019-09-19 ENCOUNTER — Encounter (HOSPITAL_COMMUNITY): Payer: Self-pay | Admitting: Emergency Medicine

## 2019-09-19 ENCOUNTER — Other Ambulatory Visit: Payer: Self-pay

## 2019-09-19 DIAGNOSIS — R21 Rash and other nonspecific skin eruption: Secondary | ICD-10-CM | POA: Insufficient documentation

## 2019-09-19 LAB — COMPREHENSIVE METABOLIC PANEL
ALT: 12 U/L (ref 0–44)
AST: 30 U/L (ref 15–41)
Albumin: 4.1 g/dL (ref 3.5–5.0)
Alkaline Phosphatase: 55 U/L (ref 38–126)
Anion gap: 12 (ref 5–15)
BUN: 14 mg/dL (ref 6–20)
CO2: 24 mmol/L (ref 22–32)
Calcium: 9.2 mg/dL (ref 8.9–10.3)
Chloride: 101 mmol/L (ref 98–111)
Creatinine, Ser: 0.62 mg/dL (ref 0.44–1.00)
GFR calc Af Amer: >60 mL/min (ref >60)
GFR calc non Af Amer: >60 mL/min (ref >60)
Glucose, Bld: 128 mg/dL — ABNORMAL HIGH (ref 70–99)
Potassium: 3.8 mmol/L (ref 3.5–5.1)
Sodium: 137 mmol/L (ref 135–145)
Total Bilirubin: 0.7 mg/dL (ref 0.3–1.2)
Total Protein: 7.3 g/dL (ref 6.5–8.1)

## 2019-09-19 LAB — POCT URINALYSIS DIP (DEVICE)
Glucose, UA: NEGATIVE mg/dL
Ketones, ur: NEGATIVE mg/dL
Leukocytes,Ua: NEGATIVE
Nitrite: NEGATIVE
Protein, ur: 100 mg/dL — AB
Specific Gravity, Urine: >=1.03 (ref 1.005–1.030)
Urobilinogen, UA: 0.2 mg/dL (ref 0.0–1.0)
pH: 5.5 (ref 5.0–8.0)

## 2019-09-19 LAB — CBC WITH DIFFERENTIAL/PLATELET
Abs Immature Granulocytes: 0.02 10*3/uL (ref 0.00–0.07)
Basophils Absolute: 0.1 10*3/uL (ref 0.0–0.1)
Basophils Relative: 1 %
Eosinophils Absolute: 0.1 10*3/uL (ref 0.0–0.5)
Eosinophils Relative: 2 %
HCT: 44.2 % (ref 36.0–46.0)
Hemoglobin: 14.8 g/dL (ref 12.0–15.0)
Immature Granulocytes: 0 %
Lymphocytes Relative: 33 %
Lymphs Abs: 2.6 10*3/uL (ref 0.7–4.0)
MCH: 33.9 pg (ref 26.0–34.0)
MCHC: 33.5 g/dL (ref 30.0–36.0)
MCV: 101.4 fL — ABNORMAL HIGH (ref 80.0–100.0)
Monocytes Absolute: 0.7 10*3/uL (ref 0.1–1.0)
Monocytes Relative: 8 %
Neutro Abs: 4.4 10*3/uL (ref 1.7–7.7)
Neutrophils Relative %: 56 %
Platelets: 208 10*3/uL (ref 150–400)
RBC: 4.36 MIL/uL (ref 3.87–5.11)
RDW: 12.4 % (ref 11.5–15.5)
WBC: 7.8 10*3/uL (ref 4.0–10.5)
nRBC: 0 % (ref 0.0–0.2)

## 2019-09-19 NOTE — ED Provider Notes (Signed)
[place]    CSN: [id number] Arrival date & time: 09/19/19  1419      History   Chief Complaint Chief Complaint  Patient presents with  . Rash    HPI Christina Miller is a 54 y.o. female.   Patient reports urgent care today for complaint of rash that has been present for 2 to 3 weeks.  She reports small red dot-like rash on her legs arms and torso.  She reports the rash is not itchy or painful.  She denies change in skin care products, laundry detergent or exposures to irritants.  She reports starting amlodipine in early February.  She denies wheezing or swelling in her throat.  She denies any gum bleeding.  Denies any blood in her urine.  She denies fever and chills.  Denies recent illness.  She does not work outside much and has not been exposed any tick bites that she knows of.  She has had no recent sick contacts.  She does have a history of multiple sclerosis and she sees a neurologist for this as well as COPD.  She is a current smoker.     Past Medical History:  Diagnosis Date  . Abnormal Pap smear    cryo  . Allergy   . Anemia   . Bone fracture    ankle  . Cancer [facility]) 1987   cervical Cancer  . Colonic diverticular abscess 02/12/2019  . COPD (chronic obstructive pulmonary disease) ([place])   . Emphysema   . Emphysema of lung ([place])   . GERD (gastroesophageal reflux disease)   . Headache(784.0)   . Hypertension   . Infection    urinary tract infection  . MS (multiple sclerosis) ([place])   . Neuromuscular disorder ([place])    hands and feet face and lip and muscle weakness due to MS  . Pelvic abscess in female   . Sepsis due to Escherichia coli (E. coli) ([place]) 03/09/2018    Patient Active Problem List   Diagnosis Date Noted  . White matter abnormality on MRI of brain 09/11/2019  . Numbness 09/11/2019  . Vision disturbance 09/11/2019  . Asymptomatic varicose veins of left lower extremity 08/22/2019  . Colonic diverticular abscess 02/13/2019  .  Anxiety state 01/30/2019  . Tubular adenoma of colon 07/02/2018  . Financial difficulties 06/26/2018  . Tobacco dependence 03/26/2018  . Essential hypertension 03/26/2018  . Multiple sclerosis ([place]) 03/26/2018  . Anemia 03/26/2018  . 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 Intra-abdominal abscess ([place]) 03/09/2018  . COPD (chronic obstructive pulmonary disease) ([place]) 03/09/2018  . Diverticulitis of large intestine with perforation and abscess 02/24/2018  . Protrusion of lumbar intervertebral disc 04/21/2016  . Breast lump on left side at 7 o'clock position 06/22/2012  . Tobacco abuse 06/02/2011  . Perimenopause 06/02/2011  . Hematuria, microscopic 06/02/2011    Past Surgical History:  Procedure Laterality Date  . ARM WOUND REPAIR / CLOSURE    . BREAST BIOPSY Left 07/03/2012  . COLPOSCOPY    . DILATION AND CURETTAGE OF UTERUS    . IR CATHETER TUBE CHANGE  04/04/2018  . IR CATHETER TUBE CHANGE  05/16/2018  . IR CATHETER TUBE CHANGE  07/05/2018  . IR CATHETER TUBE CHANGE  09/21/2018  . IR RADIOLOGIST EVAL & MGMT  03/29/2018  . IR RADIOLOGIST EVAL & MGMT  04/12/2018  . IR RADIOLOGIST EVAL & MGMT  02/20/2019  . JP DRAIN TUBE    . PROCTOSCOPY N/A 01/30/2019   Procedure: RIGID PROCTOSCOPY;  Surgeon: [name], MD;  Location: [facility];  Service: General;  Laterality: N/A;  . ROBOT ASSISTED LAPAROSCOPIC PARTIAL COLECTOMY  01/30/2019   for diverticulitis.  LSO as well  . SALPINGOOPHORECTOMY Left 01/30/2019   LSO at time of sigmoid colectomy  . TUBAL LIGATION      OB History    Gravida  7   Para  2   Term  1   Preterm  1   AB  5   Living  2     SAB  3   TAB  2   Ectopic      Multiple      Live Births  1            Home Medications    Prior to Admission medications   Medication Sig Start Date End Date Taking? Authorizing Provider  albuterol (VENTOLIN HFA) 108 (90 Base) MCG/ACT inhaler Inhale 2 puffs into the lungs every 6 (six) hours as needed for wheezing or  shortness of breath. 08/22/19   Ladell Pier, MD  amLODipine (NORVASC) 5 MG tablet Take 1 tablet (5 mg total) by mouth daily. 08/22/19   Ladell Pier, MD  ibuprofen (ADVIL) 200 MG tablet Take 200 mg by mouth every 6 (six) hours as needed.    [provider]    Family History Family History  Problem Relation Age of Onset  . Diabetes Father   . Diabetes Sister   . Hypertension Sister   . Diabetes Brother   . Hypertension Brother   . Lung cancer Mother   . Colon cancer Neg Hx   . Stomach cancer Neg Hx   . Pancreatic cancer Neg Hx   . Esophageal cancer Neg Hx   . Rectal cancer Neg Hx     Social History Social History   Tobacco Use  . Smoking status: Current Every Day Smoker    Packs/day: 1.00    Years: 15.00    Pack years: 15.00    Types: Cigarettes  . Smokeless tobacco: Former Network engineer Use Topics  . Alcohol use: Yes    Comment: once or twice per week  . Drug use: No     Allergies   Hydrocodone and Bactrim [sulfamethoxazole-trimethoprim]   Review of Systems Review of Systems  Constitutional: Negative for chills and fever.  HENT: Negative for congestion and nosebleeds.   Gastrointestinal: Negative for abdominal pain and blood in stool.  Genitourinary: Negative for dysuria and hematuria.  Skin: Positive for color change and rash.  Neurological: Negative for headaches.     Physical Exam Triage Vital Signs ED Triage Vitals [09/19/19 1442]  Enc Vitals Group     BP (!) 161/108     Pulse Rate (!) 113     Resp 20     Temp 98.8 F (37.1 C)     Temp src      SpO2 95 %     Weight      Height      Head Circumference      Peak Flow      Pain Score 0     Pain Loc      Pain Edu?      Excl. in [place]?    No data found.  Updated Vital Signs BP (!) 161/108   Pulse (!) 113 Comment: pt just smoked.  Temp 98.8 F (37.1 C)   Resp 20   LMP 06/20/2012   SpO2 95%   Visual Acuity Right  Eye Distance:   Left Eye Distance:   Bilateral  Distance:    Right Eye Near:   Left Eye Near:    Bilateral Near:     Physical Exam Vitals and nursing note reviewed.  Constitutional:      General: She is not in acute distress.    Appearance: Normal appearance. She is well-developed and normal weight. She is not ill-appearing.  HENT:     Head: Normocephalic and atraumatic.     Nose: Nose normal.     Mouth/Throat:     Mouth: Mucous membranes are moist.     Pharynx: Oropharynx is clear. No posterior oropharyngeal erythema.  Eyes:     Extraocular Movements: Extraocular movements intact.     Conjunctiva/sclera: Conjunctivae normal.     Pupils: Pupils are equal, round, and reactive to light.  Cardiovascular:     Rate and Rhythm: Normal rate and regular rhythm.     Heart sounds: No murmur.  Pulmonary:     Effort: Pulmonary effort is normal. No respiratory distress.     Breath sounds: Normal breath sounds.  Abdominal:     Palpations: Abdomen is soft.     Tenderness: There is no abdominal tenderness. There is no right CVA tenderness or left CVA tenderness.  Musculoskeletal:     Cervical back: Neck supple.  Skin:    General: Skin is warm and dry.     Coloration: Skin is not jaundiced.     Findings: No bruising.     Comments: Circular nonraised nonblanching lesions on lower extremities, abdomen and back.  Examples shown below in pictures  Neurological:     General: No focal deficit present.     Mental Status: She is alert and oriented to person, place, and time.  Psychiatric:        Mood and Affect: Mood normal.        Behavior: Behavior normal.        Thought Content: Thought content normal.        Judgment: Judgment normal.              UC Treatments / Results  Labs (all labs ordered are listed, but only abnormal results are displayed) Labs Reviewed  CBC WITH DIFFERENTIAL/PLATELET - Abnormal; Notable for the following components:      Result Value   MCV 101.4 (*)    All other components within normal limits    COMPREHENSIVE METABOLIC PANEL - Abnormal; Notable for the following components:   Glucose, Bld 128 (*)    All other components within normal limits  POCT URINALYSIS DIP (DEVICE) - Abnormal; Notable for the following components:   Bilirubin Urine SMALL (*)    Hgb urine dipstick MODERATE (*)    Protein, ur 100 (*)    All other components within normal limits    EKG   Radiology No results found.  Procedures Procedures (including critical care time)  Medications Ordered in UC Medications - No data to display  Initial Impression / Assessment and Plan / UC Course  I have reviewed the triage vital signs and the nursing notes.  Pertinent labs & imaging results that were available during my care of the patient were reviewed by me and considered in my medical decision making (see chart for details).     #Rash Patient is a 54 year old female presenting with 2 to 3 weeks of rash.  UA with moderate blood however it appears she has had some level of laboratory level hematuria for several  years on chart review.  CBC with differential and CMP was sent.  Given nonblanchable nature concern for petechial rash.  Differential would include vasculitides.  Will contact patient with results of basic work-up today and instructed to follow-up with her primary care in 1 to 2 weeks for reevaluation and continued work-up.  Patient states understanding and will schedule appoint with her primary care. -CBC and CMP returned after patient discharge.  Only abnormality was elevated glucose.  Reassuring at this time. -Upon chart review it appears that patient has persistently had microscopic hematuria which is also reassuring that this is not a new finding in the setting of possible vasculitides. -Discussed results with patient on the phone she will follow-up with her primary care.  Final Clinical Impressions(s) / UC Diagnoses   Final diagnoses:  Rash     Discharge Instructions     We have drawn your blood  work and we will call you if we need to change your management.  I would like for you to call your primary care and have an appointment in the next 1 to 2 weeks to reassess your skin rash.  If your rash becomes significantly worse or you notice bleeding from your gums or noticed easy bruising I would like for you to call your primary care for sooner follow-up.      ED Prescriptions    None     PDMP not reviewed this encounter.   Purnell Shoemaker, PA-C 09/19/19 2016

## 2019-09-19 NOTE — Discharge Instructions (Signed)
We have drawn your blood work and we will call you if we need to change your management.  I would like for you to call your primary care and have an appointment in the next 1 to 2 weeks to reassess your skin rash.  If your rash becomes significantly worse or you notice bleeding from your gums or noticed easy bruising I would like for you to call your primary care for sooner follow-up.

## 2019-09-19 NOTE — ED Triage Notes (Signed)
Pt c/o rash x2 weeks on legs and arms.

## 2019-10-07 ENCOUNTER — Other Ambulatory Visit: Payer: Medicaid Other

## 2019-10-08 ENCOUNTER — Ambulatory Visit: Payer: Medicaid Other | Attending: Internal Medicine | Admitting: Internal Medicine

## 2019-10-08 ENCOUNTER — Ambulatory Visit: Payer: Medicaid Other

## 2019-10-08 ENCOUNTER — Telehealth: Payer: Self-pay

## 2019-10-08 ENCOUNTER — Other Ambulatory Visit: Payer: Self-pay

## 2019-10-08 NOTE — Telephone Encounter (Signed)
Patient came in for an evoked potential test today, visibly shaking and nervous. Patient advised me that she was very concerned about the possibility of the test causing her to have a seizure. I explained to her that because of the flashing checkered boxes, there was a very small chance that it could happen. I also let her know that because she doesn't have a history of seizures, the chances is even less. Patient was still very nervous and teary. I checked with Dr. Felecia Shelling and he reviewed her chart and also advised that she would be ok to have the test done. I spoke with patient again and tried to reassure her but she decided that she did not want to have the test done at this time. She was very apologetic and I advised her that it was ok and that if she changed her mind to call us to reschedule.

## 2019-11-02 ENCOUNTER — Other Ambulatory Visit: Payer: Medicaid Other

## 2019-11-15 IMAGING — CT CT ABD-PELV W/ CM
2 of 5 series · 15 of 46 positions shown, 17 images · IV contrast (omnipaque)
Comparison: CT the abdomen and pelvis 11/04/2012.

CLINICAL DATA: 52-year-old female with history of epigastric pain.

EXAM:
CT ABDOMEN AND PELVIS WITH CONTRAST
TECHNIQUE: Multidetector CT imaging of the abdomen and pelvis was performed
using the standard protocol following bolus administration of
intravenous contrast.
CONTRAST:  100mL OMNIPAQUE IOHEXOL 300 MG/ML  SOLN

[Series 3: a/p w/ 5mm · axial · 0.64mm/px · z∈[+840,+1220]mm · 12 of 86 slices shown, 14 images]
[im 5/86  soft-tissue]
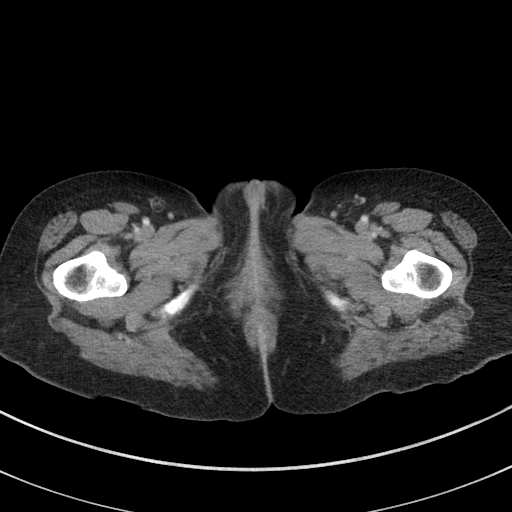
[im 5/86  bone]
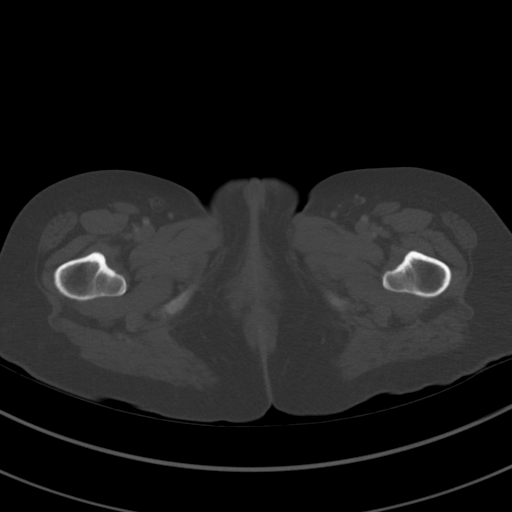
[im 13/86  soft-tissue]
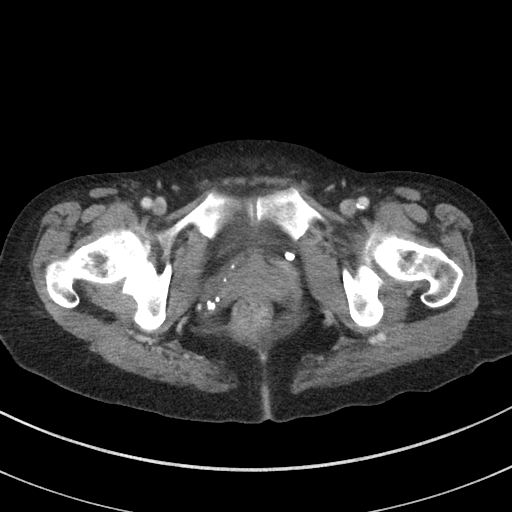
[im 18/86  soft-tissue]
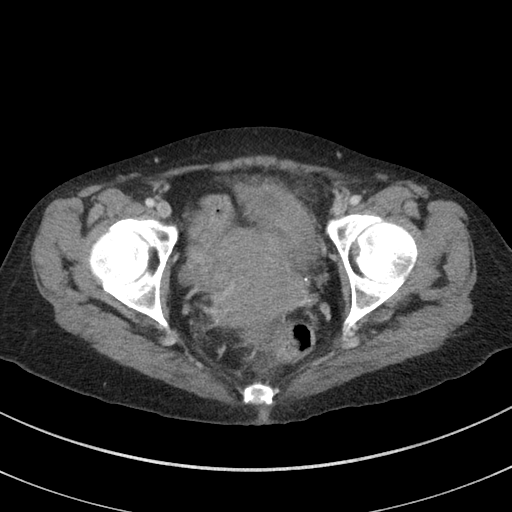
[im 26/86  soft-tissue]
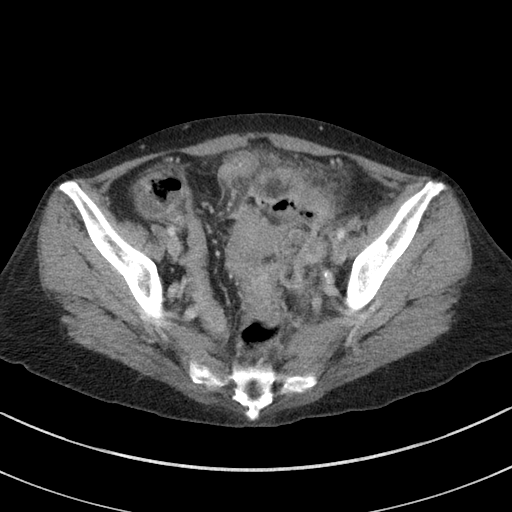
[im 35/86  soft-tissue]
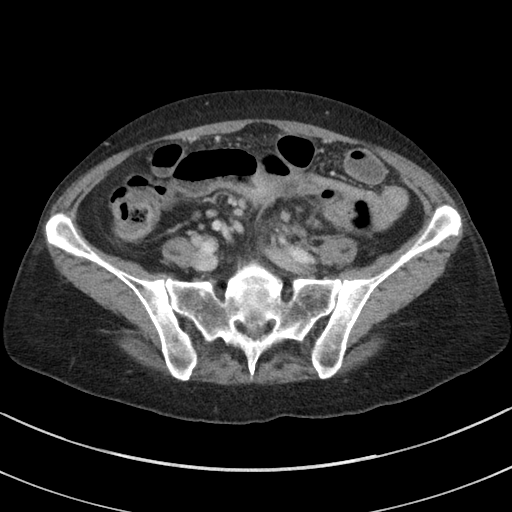
[im 39/86  soft-tissue]
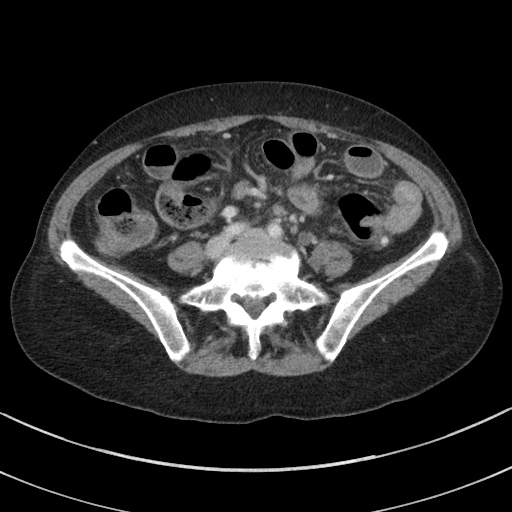
[im 47/86  soft-tissue]
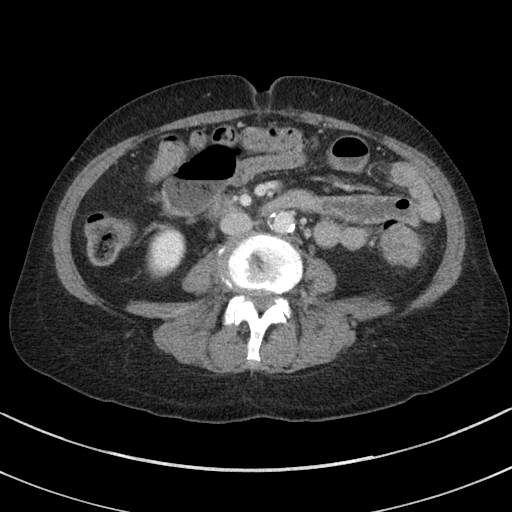
[im 52/86  soft-tissue]
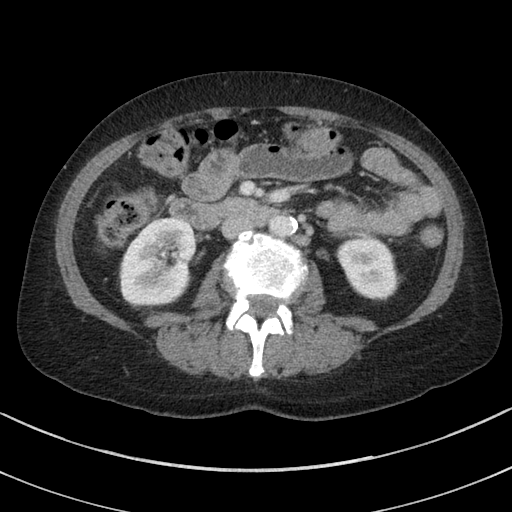
[im 60/86  soft-tissue]
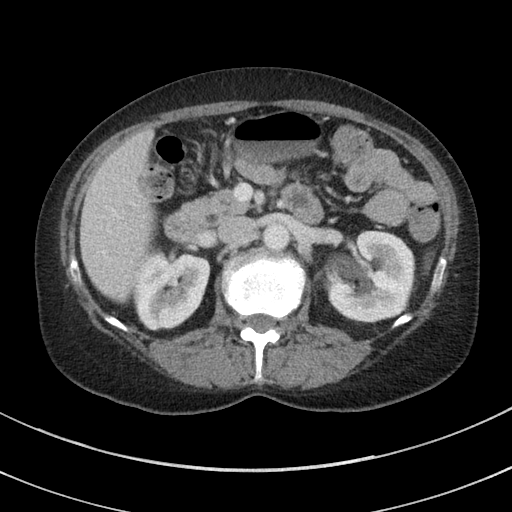
[im 60/86  bone]
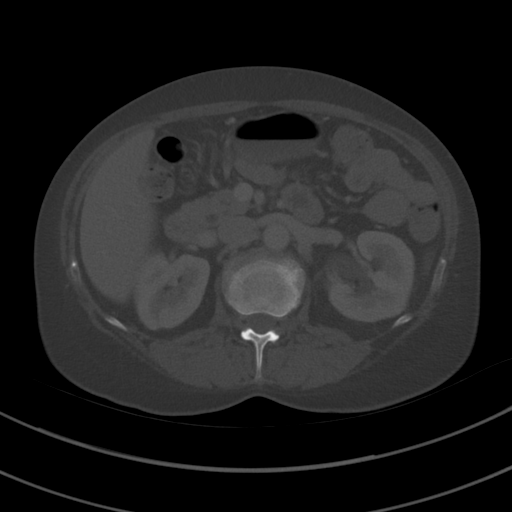
[im 69/86  soft-tissue]
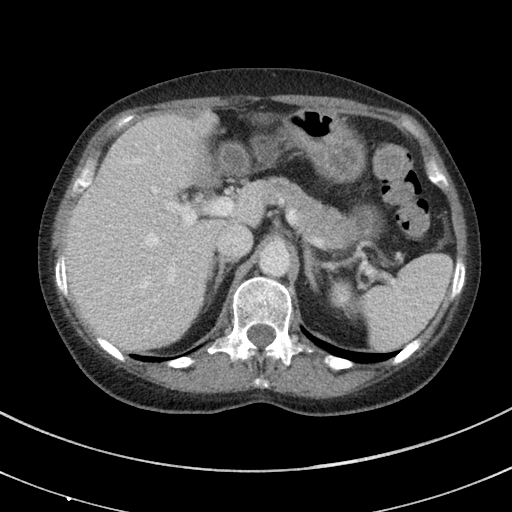
[im 73/86  soft-tissue]
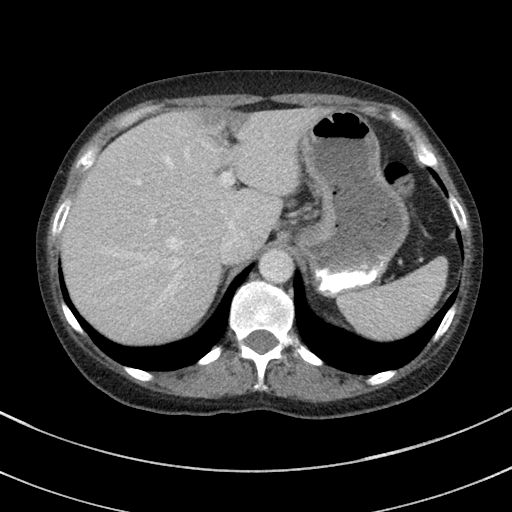
[im 81/86  soft-tissue]
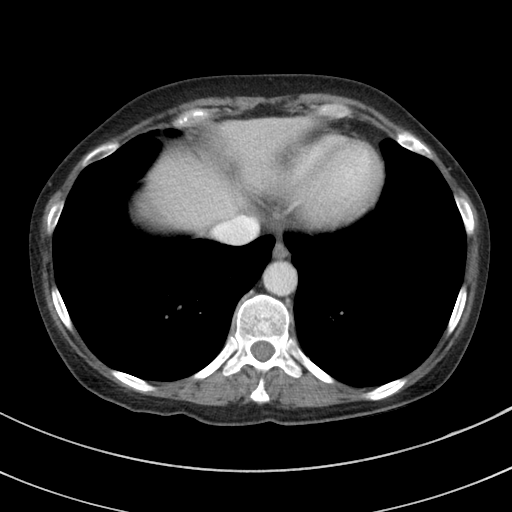

[Series 6: a/p w/ cor · coronal · 0.71mm/px · 3 of 109 slices shown]
[im 37/109  soft-tissue]
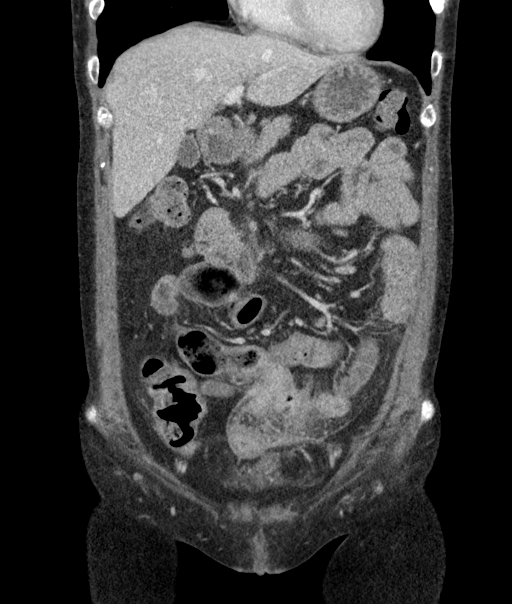
[im 49/109  soft-tissue]
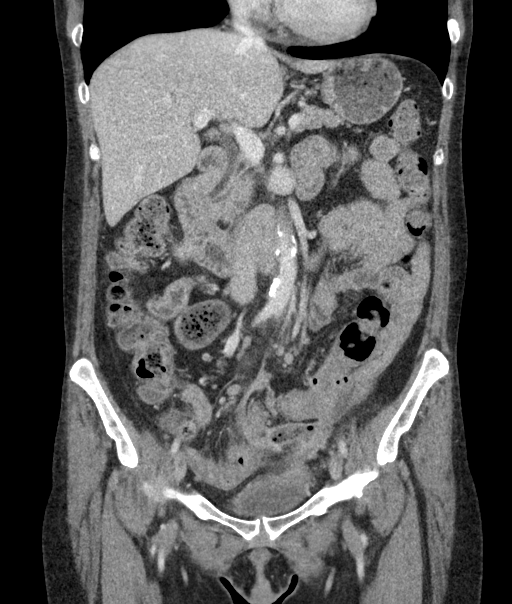
[im 61/109  soft-tissue]
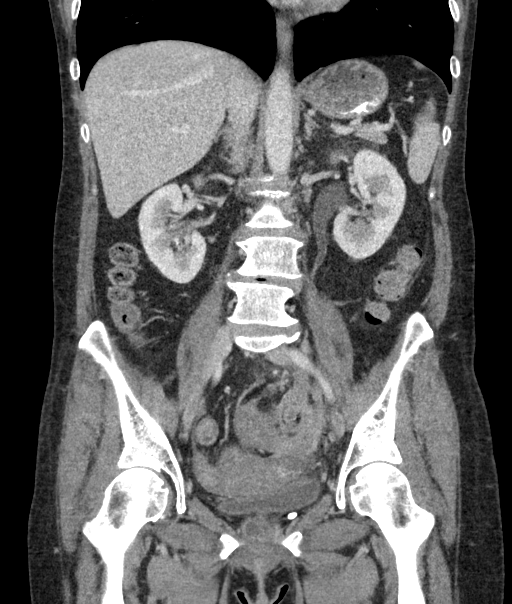

[15 of 46 positions shown; findings below may reference images not displayed]

FINDINGS: Lower chest: 3 mm pulmonary nodule in the right middle lobe (axial
image 1 of series 5), unchanged compared to 9010, considered
definitively benign requiring no future imaging follow-up.

Hepatobiliary: Low-attenuation/hypoperfusion adjacent to the
falciform ligament most evident in segment 4B, likely to represent a
benign perfusion anomaly. No other definite suspicious appearing
cystic or solid hepatic lesions. No intra or extrahepatic biliary
ductal dilatation. Gallbladder is normal in appearance.

Pancreas: No pancreatic mass. No pancreatic ductal dilatation. No
pancreatic or peripancreatic fluid or inflammatory changes.

Spleen: Unremarkable.

Adrenals/Urinary Tract: Mild left hydroureteronephrosis which
appears related to extrinsic compression of the distal third of the
left ureter (discussed below). Bilateral kidneys and adrenal glands
are otherwise normal in appearance. Urinary bladder is normal in
appearance.

Stomach/Bowel: Normal appearance of the stomach. No pathologic
dilatation of small bowel or colon. However, there are areas
throughout the mid to distal small bowel with profound mural
thickening and extensive surrounding inflammatory changes. Terminal
ileum does appear involved with mural thickening and luminal
narrowing. The most severe area of mural thickening appears to be in
the proximal ileum or distal jejunum, best appreciated in the
central aspect of the pelvis where there are extensive inflammatory
changes in the small bowel mesentery, as well as extraluminal tracks
which appear to reflect enteroenteric and/or enterocolonic fistulae,
which appear to contain fecalized contents. This is best appreciated
on coronal images 39-75 of series 6. Normal appendix.

Vascular/Lymphatic: Aortic atherosclerosis, without evidence of
aneurysm or dissection in the abdominal or pelvic vasculature. No
lymphadenopathy noted in the abdomen or pelvis.

Reproductive: Uterus and ovaries are unremarkable in appearance.

Other: Trace volume of ascites.  No pneumoperitoneum.

Musculoskeletal: There are no aggressive appearing lytic or blastic
lesions noted in the visualized portions of the skeleton.
IMPRESSION: 1. There is a spectrum of findings suggestive of active Crohn's
disease, as detailed above. In addition to extensive areas of mural
thickening, mucosal hyperenhancement and surrounding inflammatory
changes, there appears to be enteroenteric and/or enterocolonic
fistulae, as discussed above.
2. Aortic atherosclerosis.
3. Additional incidental findings, as above.

## 2019-11-18 ENCOUNTER — Ambulatory Visit: Payer: Medicaid Other | Admitting: Internal Medicine

## 2019-12-02 ENCOUNTER — Other Ambulatory Visit: Payer: Medicaid Other

## 2019-12-09 ENCOUNTER — Telehealth: Payer: Self-pay | Admitting: *Deleted

## 2019-12-09 NOTE — Telephone Encounter (Signed)
Called pt. MD has meeting during her appt time 12/11/19. R/s for 12/10/19 at 4pm with Dr. Felecia Shelling. She is having numbness, issues with her throat. Also has red spots all over her body. She has been unable to reach PCP. Went to urgent care 09/19/19 who wanted her to f/u with PCP afterwards. She has not completed MRI's that Dr. Felecia Shelling ordered yet. She is working on getting them r/s. She had a family member pass away recently.

## 2019-12-10 ENCOUNTER — Ambulatory Visit: Payer: Self-pay | Admitting: Neurology

## 2019-12-10 ENCOUNTER — Encounter: Payer: Self-pay | Admitting: Neurology

## 2019-12-11 ENCOUNTER — Ambulatory Visit: Payer: Medicaid Other | Admitting: Neurology

## 2020-02-02 ENCOUNTER — Other Ambulatory Visit: Payer: Medicaid Other

## 2020-02-25 ENCOUNTER — Other Ambulatory Visit: Payer: Self-pay

## 2020-02-25 ENCOUNTER — Encounter (HOSPITAL_COMMUNITY): Payer: Self-pay

## 2020-02-25 ENCOUNTER — Ambulatory Visit (HOSPITAL_COMMUNITY)
Admission: EM | Admit: 2020-02-25 | Discharge: 2020-02-25 | Disposition: A | Discharge status: Home / Self Care | Payer: Medicaid Other | Attending: Family Medicine | Admitting: Family Medicine

## 2020-02-25 DIAGNOSIS — R09A2 Foreign body sensation, throat: Secondary | ICD-10-CM

## 2020-02-25 DIAGNOSIS — R0989 Other specified symptoms and signs involving the circulatory and respiratory systems: Secondary | ICD-10-CM

## 2020-02-25 DIAGNOSIS — I10 Essential (primary) hypertension: Secondary | ICD-10-CM | POA: Diagnosis not present

## 2020-02-25 DIAGNOSIS — F17210 Nicotine dependence, cigarettes, uncomplicated: Secondary | ICD-10-CM | POA: Diagnosis not present

## 2020-02-25 NOTE — Discharge Instructions (Addendum)
Your blood pressure was noted to be elevated during your visit today. If you are currently taking medication for high blood pressure, please ensure you are taking this as directed. If you do not have a history of high blood pressure and your blood pressure remains persistently elevated, you may need to begin taking a medication at some point. You may return here within the next few days to recheck if unable to see your primary care provider or if do not have a one.  BP (S) (!) 149/104 (BP Location: Right Arm) Comment: Pt states she does NOT take her BP as directed  Pulse (!) 113   Temp 98.5 F (36.9 C) (Oral)   Resp 16   LMP 06/20/2012   SpO2 97%

## 2020-02-25 NOTE — ED Triage Notes (Addendum)
Pt is here with the feeling of something in her throat, pt states she has had this feeling for months. Pt states she has COPD and Emphysema, pt has choose not to make an appt at her PCP.

## 2020-02-28 NOTE — ED Provider Notes (Signed)
[place] [facility]   [id number] 02/25/20 Arrival Time: 1941  ASSESSMENT & PLAN:  1. Globus pharyngeus   2. Elevated blood pressure reading in office with diagnosis of hypertension   3. Cigarette smoker     No signs of peritonsillar abscess. Discussed.  Trail of OTC Omeprazole.  Recommend:  Follow-up Information    Schedule an appointment as soon as possible for a visit  with [facility].   Contact information: [address] [place] [phone]                 Discharge Instructions     Your blood pressure was noted to be elevated during your visit today. If you are currently taking medication for high blood pressure, please ensure you are taking this as directed. If you do not have a history of high blood pressure and your blood pressure remains persistently elevated, you may need to begin taking a medication at some point. You may return here within the next few days to recheck if unable to see your primary care provider or if do not have a one.  BP (S) (!) 149/104 (BP Location: Right Arm) Comment: Pt states she does NOT take her BP as directed  Pulse (!) 113   Temp 98.5 F (36.9 C) (Oral)   Resp 16   LMP 06/20/2012   SpO2 97%      Three plus minutes of smoking cessation counseling done. Patient co-morbidities include COPD. Risks of smoking reviewed including possible lung disease (COPD, cancer) and cardiac disease.  Benefits of smoking cessation also presented. Currently in pre-contemplative stage with no desire to quit at this time. Encourage her to think about this. Also encouraged her to follow-up with a primary care provider should she desire to quit.  Reviewed expectations re: course of current medical issues. Questions answered. Outlined signs and symptoms indicating need for more acute intervention. Patient verbalized understanding. After Visit Summary given.   SUBJECTIVE:  Christina Miller is a 54 y.o. female who reports a sensation of "something in my throat when I swallow". Gradual onset. Present for a few months. Normal PO intake without n/v/d. Appetite normal. No weight change. No recent illnesses. Afebrile. No significant h/o GERD reported. Is belching more. No abdominal pain.  Social History   Tobacco Use  Smoking Status Current Every Day Smoker  . Packs/day: 1.00  . Years: 15.00  . Pack years: 15.00  . Types: Cigarettes  Smokeless Tobacco Former User   Increased blood pressure noted today. Reports that she is treated for HTN.   She reports taking medications as instructed, no chest pain on exertion, no dyspnea on exertion, no swelling of ankles, no orthostatic dizziness or lightheadedness, no orthopnea or paroxysmal nocturnal dyspnea and no palpitations.    OBJECTIVE:  Vitals:   02/25/20 1620  BP: (S) (!) 149/104  Pulse: (!) 113  Resp: 16  Temp: 98.5 F (36.9 C)  TempSrc: Oral  SpO2: 97%     General appearance: alert; no distress HEENT: throat with mild irritation; uvula is midline Neck: supple with FROM; no lymphadenopathy Lungs: speaks full sentences without difficulty; unlabored Abd: soft; non-tender Skin: reveals no rash; warm and dry Psychological: alert and cooperative; normal mood and affect  Allergies  Allergen Reactions  . Hydrocodone Nausea And Vomiting  . Bactrim [Sulfamethoxazole-Trimethoprim] Rash    Past Medical History:  Diagnosis Date  . Abnormal Pap smear    cryo  .  Allergy   . Anemia   . Bone fracture    ankle  . Cancer [facility]) 1987   cervical Cancer  . Colonic diverticular abscess 02/12/2019  . COPD (chronic obstructive pulmonary disease) ([place])   . Emphysema   . Emphysema of lung ([place])   . GERD (gastroesophageal reflux disease)   . Headache(784.0)   . Hypertension   . Infection    urinary tract infection  . MS (multiple sclerosis) ([place])   . Neuromuscular disorder ([place])    hands and feet face and lip  and muscle weakness due to MS  . Pelvic abscess in female   . Sepsis due to Escherichia coli (E. coli) ([place]) 03/09/2018   Social History   Socioeconomic History  . Marital status: Legally Separated    Spouse name: Not on file  . Number of children: 2  . Years of education: 9  . Highest education level: Not on file  Occupational History  . Occupation: unemployed  Tobacco Use  . Smoking status: Current Every Day Smoker    Packs/day: 1.00    Years: 15.00    Pack years: 15.00    Types: Cigarettes  . Smokeless tobacco: Former Network engineer  . Vaping Use: Never used  Substance and Sexual Activity  . Alcohol use: Yes  . Drug use: No  . Sexual activity: Yes    Birth control/protection: Surgical  Other Topics Concern  . Not on file  Social History Narrative    Right handed    Caffeine use: coffee every morning (1 cup)   Pepsi   Lives with boyfriend, Virl Cagey   Social Determinants of Health   Financial Resource Strain:   . Difficulty of Paying Living Expenses:   Food Insecurity:   . Worried About Charity fundraiser in the Last Year:   . Arboriculturist in the Last Year:   Transportation Needs:   . Film/video editor (Medical):   Marland Kitchen Lack of Transportation (Non-Medical):   Physical Activity:   . Days of Exercise per Week:   . Minutes of Exercise per Session:   Stress:   . Feeling of Stress :   Social Connections:   . Frequency of Communication with Friends and Family:   . Frequency of Social Gatherings with Friends and Family:   . Attends Religious Services:   . Active Member of Clubs or Organizations:   . Attends Archivist Meetings:   Marland Kitchen Marital Status:   Intimate Partner Violence:   . Fear of Current or Ex-Partner:   . Emotionally Abused:   Marland Kitchen Physically Abused:   . Sexually Abused:    Family History  Problem Relation Age of Onset  . Diabetes Father   . Diabetes Sister   . Hypertension Sister   . Diabetes Brother   . Hypertension Brother    . Lung cancer Mother   . Colon cancer Neg Hx   . Stomach cancer Neg Hx   . Pancreatic cancer Neg Hx   . Esophageal cancer Neg Hx   . Rectal cancer Neg Hx           Vanessa Kick, MD 02/28/20 1339

## 2020-02-29 ENCOUNTER — Encounter (HOSPITAL_COMMUNITY): Payer: Self-pay | Admitting: Emergency Medicine

## 2020-02-29 ENCOUNTER — Other Ambulatory Visit: Payer: Self-pay

## 2020-02-29 ENCOUNTER — Emergency Department (HOSPITAL_COMMUNITY): Payer: Medicaid Other

## 2020-02-29 ENCOUNTER — Emergency Department (HOSPITAL_COMMUNITY)
Admission: EM | Admit: 2020-02-29 | Discharge: 2020-02-29 | Disposition: A | Discharge status: Home / Self Care | Payer: Medicaid Other | Attending: Emergency Medicine | Admitting: Emergency Medicine

## 2020-02-29 DIAGNOSIS — R197 Diarrhea, unspecified: Secondary | ICD-10-CM | POA: Diagnosis not present

## 2020-02-29 DIAGNOSIS — R457 State of emotional shock and stress, unspecified: Secondary | ICD-10-CM | POA: Diagnosis not present

## 2020-02-29 DIAGNOSIS — R42 Dizziness and giddiness: Secondary | ICD-10-CM | POA: Diagnosis not present

## 2020-02-29 DIAGNOSIS — R079 Chest pain, unspecified: Secondary | ICD-10-CM | POA: Diagnosis not present

## 2020-02-29 DIAGNOSIS — Z5321 Procedure and treatment not carried out due to patient leaving prior to being seen by health care provider: Secondary | ICD-10-CM | POA: Insufficient documentation

## 2020-02-29 DIAGNOSIS — R531 Weakness: Secondary | ICD-10-CM | POA: Diagnosis not present

## 2020-02-29 DIAGNOSIS — R0789 Other chest pain: Secondary | ICD-10-CM | POA: Diagnosis not present

## 2020-02-29 LAB — CBC
HCT: 42.3 % (ref 36.0–46.0)
Hemoglobin: 14.1 g/dL (ref 12.0–15.0)
MCH: 34.1 pg — ABNORMAL HIGH (ref 26.0–34.0)
MCHC: 33.3 g/dL (ref 30.0–36.0)
MCV: 102.2 fL — ABNORMAL HIGH (ref 80.0–100.0)
Platelets: 194 10*3/uL (ref 150–400)
RBC: 4.14 MIL/uL (ref 3.87–5.11)
RDW: 11.8 % (ref 11.5–15.5)
WBC: 10.1 10*3/uL (ref 4.0–10.5)
nRBC: 0 % (ref 0.0–0.2)

## 2020-02-29 LAB — BASIC METABOLIC PANEL
Anion gap: 13 (ref 5–15)
BUN: 9 mg/dL (ref 6–20)
CO2: 26 mmol/L (ref 22–32)
Calcium: 9.3 mg/dL (ref 8.9–10.3)
Chloride: 102 mmol/L (ref 98–111)
Creatinine, Ser: 0.6 mg/dL (ref 0.44–1.00)
GFR calc Af Amer: >60 mL/min (ref >60)
GFR calc non Af Amer: >60 mL/min (ref >60)
Glucose, Bld: 95 mg/dL (ref 70–99)
Potassium: 3.5 mmol/L (ref 3.5–5.1)
Sodium: 141 mmol/L (ref 135–145)

## 2020-02-29 LAB — I-STAT BETA HCG BLOOD, ED (MC, WL, AP ONLY): I-stat hCG, quantitative: 6.3 m[IU]/mL — ABNORMAL HIGH (ref <5)

## 2020-02-29 LAB — TROPONIN I (HIGH SENSITIVITY): Troponin I (High Sensitivity): 9 ng/L (ref <18)

## 2020-02-29 NOTE — ED Notes (Signed)
Patient is leaving facility. Wait time was explained to the pt. Pt was advised not to leave against medical advice. IV has been removed.

## 2020-02-29 NOTE — ED Triage Notes (Signed)
Pt to triage via [facility] for intermittent dull aching chest pain x 4 weeks.  Denies SOB, nausea and vomiting.  Reports diarrhea.  States she has mild dizziness that she states is normal with her MS.  Also reports bilateral upper and lower extremity weakness.  Reports feeling like something in throat.  Pt requesting anxiety meds on arrival.  EMS administered ASA 358m.  20g IV LAC.

## 2020-02-29 NOTE — ED Notes (Signed)
Pt not answering for vitals.

## 2020-02-29 NOTE — ED Notes (Signed)
Pt not answering x 3

## 2020-03-04 ENCOUNTER — Other Ambulatory Visit: Payer: Self-pay

## 2020-03-04 ENCOUNTER — Emergency Department (HOSPITAL_COMMUNITY): Payer: Medicaid Other

## 2020-03-04 ENCOUNTER — Emergency Department (HOSPITAL_COMMUNITY)
Admission: EM | Admit: 2020-03-04 | Discharge: 2020-03-04 | Disposition: A | Discharge status: Home / Self Care | Payer: Medicaid Other | Attending: Emergency Medicine | Admitting: Emergency Medicine

## 2020-03-04 ENCOUNTER — Encounter (HOSPITAL_COMMUNITY): Payer: Self-pay | Admitting: Emergency Medicine

## 2020-03-04 DIAGNOSIS — F17210 Nicotine dependence, cigarettes, uncomplicated: Secondary | ICD-10-CM | POA: Diagnosis not present

## 2020-03-04 DIAGNOSIS — I10 Essential (primary) hypertension: Secondary | ICD-10-CM | POA: Diagnosis not present

## 2020-03-04 DIAGNOSIS — Z8541 Personal history of malignant neoplasm of cervix uteri: Secondary | ICD-10-CM | POA: Insufficient documentation

## 2020-03-04 DIAGNOSIS — Z79899 Other long term (current) drug therapy: Secondary | ICD-10-CM | POA: Insufficient documentation

## 2020-03-04 DIAGNOSIS — J029 Acute pharyngitis, unspecified: Secondary | ICD-10-CM | POA: Insufficient documentation

## 2020-03-04 DIAGNOSIS — F458 Other somatoform disorders: Secondary | ICD-10-CM | POA: Diagnosis not present

## 2020-03-04 DIAGNOSIS — R000 Tachycardia, unspecified: Secondary | ICD-10-CM | POA: Insufficient documentation

## 2020-03-04 DIAGNOSIS — J449 Chronic obstructive pulmonary disease, unspecified: Secondary | ICD-10-CM | POA: Diagnosis not present

## 2020-03-04 DIAGNOSIS — R0989 Other specified symptoms and signs involving the circulatory and respiratory systems: Secondary | ICD-10-CM

## 2020-03-04 MED ORDER — FAMOTIDINE 20 MG PO TABS
20.0000 mg | ORAL_TABLET | Freq: Once | ORAL | Status: AC
  Administered 2020-03-04: 20 mg via ORAL
  Filled 2020-03-04: qty 1

## 2020-03-04 MED ORDER — LIDOCAINE VISCOUS HCL 2 % MT SOLN
15.0000 mL | Freq: Once | OROMUCOSAL | Status: AC
  Administered 2020-03-04: 15 mL via ORAL
  Filled 2020-03-04: qty 15

## 2020-03-04 MED ORDER — ALUM & MAG HYDROXIDE-SIMETH 200-200-20 MG/5ML PO SUSP
30.0000 mL | Freq: Once | ORAL | Status: AC
  Administered 2020-03-04: 30 mL via ORAL
  Filled 2020-03-04: qty 30

## 2020-03-04 MED ORDER — FAMOTIDINE 20 MG PO TABS: 20.0000 mg | ORAL_TABLET | Freq: Two times a day (BID) | ORAL | 0 refills | Status: DC | PRN

## 2020-03-04 NOTE — ED Triage Notes (Signed)
Patient states she has had a sore throat for a while, feels like something is there when she swallows. Able to eat and drink, talking without difficulty. 1 ppd smoker for 'years.' Endorses a productive cough for 'a while.'

## 2020-03-04 NOTE — ED Provider Notes (Addendum)
[facility] DEPT Provider Note   CSN: [id number] Arrival date & time: 03/04/20  1352     History No chief complaint on file.   Christina Miller is a 54 y.o. female hx of COPD, hypertension here presenting with trouble swallowing.  Patient states that she has some trouble swallowing for the last several weeks.  She went to urgent care about 8 days ago and was diagnosed with globus sensation.  She is already on Prilosec for possible reflux.  She has a ENT follow-up in about 10 days.  She states that she came to [facility] several days ago and left without being seen.  She states that every time she swallows, she felt like food does not go down very well.  She denies anything stuck in her throat however.  Has some nonproductive cough.  She did not get a Covid vaccine but she states that she has been staying home and her significant other received a Covid vaccine already.  Denies any fevers.  She declined Covid testing today.  The history is provided by the patient.       Past Medical History:  Diagnosis Date   Abnormal Pap smear    cryo   Allergy    Anemia    Bone fracture    ankle   Cancer ([place]) 1987   cervical Cancer   Colonic diverticular abscess 02/12/2019   COPD (chronic obstructive pulmonary disease) ([facility])    Emphysema    Emphysema of lung ([place])    GERD (gastroesophageal reflux disease)    Headache(784.0)    Hypertension    Infection    urinary tract infection   MS (multiple sclerosis) ([place])    Neuromuscular disorder ([place])    hands and feet face and lip and muscle weakness due to MS   Pelvic abscess in female    Sepsis due to Escherichia coli (E. 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   Intra-abdominal abscess ([place]) 03/09/2018   COPD (chronic obstructive pulmonary disease) ([place]) 03/09/2018   Diverticulitis of large intestine with perforation and abscess 02/24/2018   Protrusion of lumbar intervertebral disc 04/21/2016   Breast lump on left side at 7 o'clock position 06/22/2012   Tobacco abuse 06/02/2011   Perimenopause 06/02/2011   Hematuria, microscopic 06/02/2011    Past Surgical History:  Procedure Laterality Date   ARM WOUND REPAIR / CLOSURE     BREAST BIOPSY Left 07/03/2012   COLPOSCOPY     DILATION AND CURETTAGE OF UTERUS     IR CATHETER TUBE CHANGE  04/04/2018   IR CATHETER TUBE CHANGE  05/16/2018   IR CATHETER TUBE CHANGE  07/05/2018   IR CATHETER TUBE CHANGE  09/21/2018   IR RADIOLOGIST EVAL & MGMT  03/29/2018   IR RADIOLOGIST EVAL & MGMT  04/12/2018   IR RADIOLOGIST EVAL & MGMT  02/20/2019   JP DRAIN TUBE     PROCTOSCOPY N/A 01/30/2019   Procedure: RIGID PROCTOSCOPY;  Surgeon: [name], MD;  Location: [facility] ORS;  Service: General;  Laterality: N/A;   ROBOT ASSISTED LAPAROSCOPIC PARTIAL COLECTOMY  01/30/2019   for diverticulitis.  LSO as well   SALPINGOOPHORECTOMY Left 01/30/2019   LSO at time of sigmoid colectomy   TUBAL LIGATION       OB History    Gravida  7   Para  2   Term  1   Preterm  1   AB  5   Living  2     SAB  3   TAB  2   Ectopic      Multiple      Live Births  1           Family History  Problem Relation Age of Onset   Diabetes Father    Diabetes Sister    Hypertension Sister    Diabetes Brother    Hypertension Brother    Lung cancer Mother    Colon cancer Neg Hx    Stomach cancer Neg Hx     Pancreatic cancer Neg Hx    Esophageal cancer Neg Hx    Rectal cancer Neg Hx     Social History   Tobacco Use   Smoking status: Current Every Day Smoker    Packs/day: 1.00    Years: 15.00    Pack years: 15.00    Types: Cigarettes   Smokeless tobacco: Former Counsellor Use: Never used  Substance Use Topics   Alcohol use: Yes   Drug use: No    Home Medications Prior to Admission medications   Medication Sig Start Date End Date Taking? Authorizing Provider  albuterol (VENTOLIN HFA) 108 (90 Base) MCG/ACT inhaler Inhale 2 puffs into the lungs every 6 (six) hours as needed for wheezing or shortness of breath. 08/22/19   Ladell Pier, MD  amLODipine (NORVASC) 5 MG tablet Take 1 tablet (5 mg total) by mouth daily. 08/22/19   Ladell Pier, MD  ibuprofen (ADVIL) 200 MG tablet Take 200 mg by mouth every 6 (six) hours as needed.    [provider]    Allergies    Hydrocodone and Bactrim [sulfamethoxazole-trimethoprim]  Review of Systems   Review of Systems  HENT: Positive for sore throat.   All other systems reviewed and are negative.   Physical Exam Updated Vital Signs BP (!) 127/105 (BP Location: Left Arm)    Pulse (!) 113    Temp 98.1 F (36.7 C) (Oral)    Resp 18    Ht 5' 2"  (1.575 m)    Wt 47.6 kg    LMP 06/20/2012    SpO2 96%    BMI 19.20 kg/m   Physical Exam Vitals and nursing note reviewed.  Constitutional:      Appearance: Normal appearance.  HENT:     Head: Normocephalic.     Nose: Nose normal.     Mouth/Throat:     Comments: OP clear, no tonsillar exudates  Eyes:     Extraocular Movements: Extraocular movements intact.     Pupils: Pupils are equal, round, and reactive to light.  Neck:     Comments: No stridor, thyroid not enlarged or tender  Cardiovascular:     Rate and Rhythm: Regular rhythm.     Pulses: Normal pulses.     Heart sounds: Normal heart sounds.     Comments: Slightly tachycardic  Pulmonary:      Effort: Pulmonary effort is normal.     Breath  sounds: Normal breath sounds.  Abdominal:     General: Abdomen is flat.     Palpations: Abdomen is soft.  Musculoskeletal:        General: Normal range of motion.     Cervical back: Normal range of motion and neck supple.  Skin:    General: Skin is warm.     Capillary Refill: Capillary refill takes less than 2 seconds.  Neurological:     General: No focal deficit present.     Mental Status: She is alert and oriented to person, place, and time.  Psychiatric:        Mood and Affect: Mood normal.        Behavior: Behavior normal.     ED Results / Procedures / Treatments   Labs (all labs ordered are listed, but only abnormal results are displayed) Labs Reviewed - No data to display  EKG None  Radiology DG Chest 2 View  Result Date: 03/04/2020 CLINICAL DATA:  Cough.  Smoker EXAM: CHEST - 2 VIEW COMPARISON:  02/29/2020 FINDINGS: Pulmonary hyperinflation which has progressed in the interval. Lungs remain clear without infiltrate or effusion. Heart size and vascularity normal. IMPRESSION: Pulmonary hyperinflation compatible with COPD.  Lungs are clear. Electronically Signed   By: Franchot Gallo M.D.   On: 03/04/2020 15:41    Procedures Procedures (including critical care time)  Medications Ordered in ED Medications  alum & mag hydroxide-simeth (MAALOX/MYLANTA) 200-200-20 MG/5ML suspension 30 mL (has no administration in time range)    And  lidocaine (XYLOCAINE) 2 % viscous mouth solution 15 mL (has no administration in time range)  famotidine (PEPCID) tablet 20 mg (has no administration in time range)    ED Course  I have reviewed the triage vital signs and the nursing notes.  Pertinent labs & imaging results that were available during my care of the patient were reviewed by me and considered in my medical decision making (see chart for details).    MDM Rules/Calculators/A&P                          Cecile Gillispie is  a 54 y.o. female here with sore throat.  Patient's throat exam is unremarkable.  She also has some globus sensation as well.  I think likely reflux.  Patient's lungs are clear.  I offered Covid testing but she declined at this point.  My suspicion for Covid is low at this point.  She is already on Prilosec.  We will add Pepcid as needed.  She already has ENT follow-up and she can get a nasopharyngeal scope to further assess.  Her thyroid is not enlarged and she has no stridor.  She is stable for discharge at this point.  Of note, patient is tachycardic but she denies any shortness of breath.  She states that she is anxious and have low suspicion for PE   Final Clinical Impression(s) / ED Diagnoses Final diagnoses:  None    Rx / DC Orders ED Discharge Orders    None       Drenda Freeze, MD 03/04/20 1634    Drenda Freeze, MD 03/04/20 [phone]

## 2020-03-04 NOTE — Discharge Instructions (Signed)
Stay hydrated   Continue prilosec   Add pepcid twice daily as needed   See ENT for follow up   Recheck heart rate with your doctor in a week   Return to ER if you have worse sore throat, cough, shortness of breath, fever

## 2020-03-05 ENCOUNTER — Encounter (HOSPITAL_COMMUNITY): Payer: Self-pay

## 2020-03-05 ENCOUNTER — Emergency Department (HOSPITAL_COMMUNITY)
Admission: EM | Admit: 2020-03-05 | Discharge: 2020-03-05 | Disposition: A | Discharge status: Home / Self Care | Payer: Medicaid Other | Attending: Emergency Medicine | Admitting: Emergency Medicine

## 2020-03-05 ENCOUNTER — Other Ambulatory Visit: Payer: Self-pay

## 2020-03-05 DIAGNOSIS — Z5321 Procedure and treatment not carried out due to patient leaving prior to being seen by health care provider: Secondary | ICD-10-CM | POA: Insufficient documentation

## 2020-03-05 DIAGNOSIS — R42 Dizziness and giddiness: Secondary | ICD-10-CM | POA: Insufficient documentation

## 2020-03-05 NOTE — ED Triage Notes (Signed)
Pt states that she got out of bed today and became dizzy as she stood up. Pt states that this only happened once. Pt states that she is not having dizziness now.

## 2020-03-06 ENCOUNTER — Encounter (HOSPITAL_COMMUNITY): Payer: Self-pay | Admitting: Emergency Medicine

## 2020-03-06 ENCOUNTER — Other Ambulatory Visit: Payer: Self-pay

## 2020-03-06 ENCOUNTER — Ambulatory Visit: Payer: Self-pay | Admitting: *Deleted

## 2020-03-06 ENCOUNTER — Emergency Department (HOSPITAL_COMMUNITY)
Admission: EM | Admit: 2020-03-06 | Discharge: 2020-03-06 | Disposition: A | Discharge status: Home / Self Care | Payer: Medicaid Other | Attending: Emergency Medicine | Admitting: Emergency Medicine

## 2020-03-06 DIAGNOSIS — M7981 Nontraumatic hematoma of soft tissue: Secondary | ICD-10-CM | POA: Insufficient documentation

## 2020-03-06 DIAGNOSIS — R070 Pain in throat: Secondary | ICD-10-CM | POA: Insufficient documentation

## 2020-03-06 DIAGNOSIS — Z5321 Procedure and treatment not carried out due to patient leaving prior to being seen by health care provider: Secondary | ICD-10-CM | POA: Diagnosis not present

## 2020-03-06 DIAGNOSIS — R531 Weakness: Secondary | ICD-10-CM | POA: Diagnosis not present

## 2020-03-06 DIAGNOSIS — R202 Paresthesia of skin: Secondary | ICD-10-CM | POA: Diagnosis not present

## 2020-03-06 LAB — CBC
HCT: 41.7 % (ref 36.0–46.0)
Hemoglobin: 13.9 g/dL (ref 12.0–15.0)
MCH: 34.9 pg — ABNORMAL HIGH (ref 26.0–34.0)
MCHC: 33.3 g/dL (ref 30.0–36.0)
MCV: 104.8 fL — ABNORMAL HIGH (ref 80.0–100.0)
Platelets: 181 K/uL (ref 150–400)
RBC: 3.98 MIL/uL (ref 3.87–5.11)
RDW: 12 % (ref 11.5–15.5)
WBC: 8.3 K/uL (ref 4.0–10.5)
nRBC: 0 % (ref 0.0–0.2)

## 2020-03-06 LAB — GROUP A STREP BY PCR: Group A Strep by PCR: NOT DETECTED

## 2020-03-06 LAB — BASIC METABOLIC PANEL
Anion gap: 12 (ref 5–15)
BUN: 14 mg/dL (ref 6–20)
CO2: 24 mmol/L (ref 22–32)
Calcium: 8.8 mg/dL — ABNORMAL LOW (ref 8.9–10.3)
Chloride: 102 mmol/L (ref 98–111)
Creatinine, Ser: 0.55 mg/dL (ref 0.44–1.00)
GFR calc Af Amer: >60 mL/min (ref >60)
GFR calc non Af Amer: >60 mL/min (ref >60)
Glucose, Bld: 91 mg/dL (ref 70–99)
Potassium: 3.8 mmol/L (ref 3.5–5.1)
Sodium: 138 mmol/L (ref 135–145)

## 2020-03-06 NOTE — Telephone Encounter (Signed)
Patient is calling with multiple complaint: dizziness, numbness in different body parts( chronic), bruising easy. Patient went to hospital yesterday for dizziness- but she left due to [place] fear. Patient states she is better today with mild dizziness patient has not been taking her BP medication and her pulse is rapid today. Advised patient to hydrate and take her medication as prescribed. Call to office for appointment message left on nurse line. Patient states she may get shower and return to ED- patient advised office will call her back.    Reason for Disposition . [1] MILD dizziness (e.g., walking normally) AND [2] has NOT been evaluated by physician for this  (Exception: dizziness caused by heat exposure, sudden standing, or poor fluid intake)  Answer Assessment - Initial Assessment Questions 1. DESCRIPTION: "Describe your dizziness."     Off balance, lightheaded- did fall yesterday 2. LIGHTHEADED: "Do you feel lightheaded?" (e.g., somewhat faint, woozy, weak upon standing)     Lightheaded today, weak 3. VERTIGO: "Do you feel like either you or the room is spinning or tilting?" (i.e. vertigo)     no 4. SEVERITY: "How bad is it?"  "Do you feel like you are going to faint?" "Can you stand and walk?"   - MILD: Feels slightly dizzy, but walking normally.   - MODERATE: Feels very unsteady when walking, but not falling; interferes with normal activities (e.g., school, work) .   - SEVERE: Unable to walk without falling, or requires assistance to walk without falling; feels like passing out now.      mild 5. ONSET:  "When did the dizziness begin?"     Yesterday- started 6. AGGRAVATING FACTORS: "Does anything make it worse?" (e.g., standing, change in head position)     no 7. HEART RATE: "Can you tell me your heart rate?" "How many beats in 15 seconds?"  (Note: not all patients can do this)       Rapid- can feel it - 105 feels- irregular 8. CAUSE: "What do you think is causing the dizziness?"      No BP medication- patient has not been taking 9. RECURRENT SYMPTOM: "Have you had dizziness before?" If Yes, ask: "When was the last time?" "What happened that time?"     Yes- 1 month- patient has MS 10. OTHER SYMPTOMS: "Do you have any other symptoms?" (e.g., fever, chest pain, vomiting, diarrhea, bleeding)       no 11. PREGNANCY: "Is there any chance you are pregnant?" "When was your last menstrual period?"       N/a-menopause  Protocols used: DIZZINESS [facility]

## 2020-03-06 NOTE — Telephone Encounter (Signed)
Will forward to pcp

## 2020-03-06 NOTE — ED Notes (Signed)
Called to room pt. No answer in lobby parking lot or restroom

## 2020-03-06 NOTE — ED Triage Notes (Signed)
Per pt, states throat pain, bruising, B/L leg weakness/numbness-history of MS

## 2020-03-06 NOTE — ED Notes (Signed)
Pt called for rooming x2. No answer

## 2020-03-06 NOTE — ED Notes (Signed)
Patient called for room placement x1 with no answer.

## 2020-03-11 NOTE — Telephone Encounter (Signed)
Christina Miller could you please contact pt and schedule a hospital follow up

## 2020-03-11 NOTE — Telephone Encounter (Signed)
Called patient and scheduled an appt with Christina Miller on 04/02/20. Patient states she will go back to the ED if she does not feel better.

## 2020-03-13 ENCOUNTER — Telehealth: Payer: Self-pay | Admitting: Internal Medicine

## 2020-03-13 NOTE — Telephone Encounter (Signed)
Patient inquiring about 8/20 lab results and strep throat. Patient states symptoms have not improved and would like a follow up call today.

## 2020-03-13 NOTE — Telephone Encounter (Signed)
Will forward to pcp

## 2020-03-15 NOTE — Telephone Encounter (Signed)
Contacted pt and left a detailed vm informing her of results and follow up.

## 2020-03-26 DIAGNOSIS — R0989 Other specified symptoms and signs involving the circulatory and respiratory systems: Secondary | ICD-10-CM | POA: Diagnosis not present

## 2020-03-26 DIAGNOSIS — K219 Gastro-esophageal reflux disease without esophagitis: Secondary | ICD-10-CM | POA: Diagnosis not present

## 2020-03-26 MED FILL — OMEPRAZOLE 40 MG CPDR: 40 | 30 days supply | Qty: 60 | Fill #0

## 2020-03-28 ENCOUNTER — Ambulatory Visit
Admission: RE | Admit: 2020-03-28 | Discharge: 2020-03-28 | Disposition: A | Discharge status: Home / Self Care | Payer: Medicaid Other | Source: Ambulatory Visit | Attending: Neurology | Admitting: Neurology

## 2020-03-28 ENCOUNTER — Other Ambulatory Visit: Payer: Self-pay

## 2020-03-28 DIAGNOSIS — R200 Anesthesia of skin: Secondary | ICD-10-CM

## 2020-03-28 DIAGNOSIS — R9082 White matter disease, unspecified: Secondary | ICD-10-CM | POA: Diagnosis not present

## 2020-03-28 MED ORDER — GADOBENATE DIMEGLUMINE 529 MG/ML IV SOLN
9.0000 mL | Freq: Once | INTRAVENOUS | Status: AC | PRN
  Administered 2020-03-28: 9 mL via INTRAVENOUS

## 2020-03-30 ENCOUNTER — Telehealth: Payer: Self-pay | Admitting: Neurology

## 2020-03-30 NOTE — Telephone Encounter (Signed)
MRI of the brain shows extensive T2/FLAIR hyperintense foci in the cerebral hemispheres.  The pattern is more consistent with chronic microvascular ischemic change than to MS, though I cannot rule out that there is a combination.  MRI of the cervical spine showed mild degenerative changes but no spinal cord lesions.  I discussed this with her and again recommended that she quit smoking and to make sure to take her blood pressure medication as those 2 risk factors are likely playing a major role in the change that has occurred between 2006 and last week.

## 2020-04-02 ENCOUNTER — Ambulatory Visit: Payer: Medicaid Other | Attending: Physician Assistant | Admitting: Internal Medicine

## 2020-04-02 ENCOUNTER — Encounter: Payer: Self-pay | Admitting: Internal Medicine

## 2020-04-02 ENCOUNTER — Other Ambulatory Visit: Payer: Self-pay

## 2020-04-02 VITALS — BP 155/90 | HR 118 | Temp 98.4°F | Resp 16 | Wt 101.8 lb

## 2020-04-02 DIAGNOSIS — J449 Chronic obstructive pulmonary disease, unspecified: Secondary | ICD-10-CM | POA: Diagnosis not present

## 2020-04-02 DIAGNOSIS — Z1231 Encounter for screening mammogram for malignant neoplasm of breast: Secondary | ICD-10-CM | POA: Diagnosis present

## 2020-04-02 DIAGNOSIS — K219 Gastro-esophageal reflux disease without esophagitis: Secondary | ICD-10-CM | POA: Insufficient documentation

## 2020-04-02 DIAGNOSIS — Z7189 Other specified counseling: Secondary | ICD-10-CM

## 2020-04-02 DIAGNOSIS — I10 Essential (primary) hypertension: Secondary | ICD-10-CM | POA: Insufficient documentation

## 2020-04-02 DIAGNOSIS — F17210 Nicotine dependence, cigarettes, uncomplicated: Secondary | ICD-10-CM | POA: Insufficient documentation

## 2020-04-02 DIAGNOSIS — H538 Other visual disturbances: Secondary | ICD-10-CM | POA: Insufficient documentation

## 2020-04-02 DIAGNOSIS — G35 Multiple sclerosis: Secondary | ICD-10-CM | POA: Insufficient documentation

## 2020-04-02 DIAGNOSIS — Z79899 Other long term (current) drug therapy: Secondary | ICD-10-CM | POA: Insufficient documentation

## 2020-04-02 DIAGNOSIS — R0989 Other specified symptoms and signs involving the circulatory and respiratory systems: Secondary | ICD-10-CM | POA: Diagnosis not present

## 2020-04-02 DIAGNOSIS — Z8249 Family history of ischemic heart disease and other diseases of the circulatory system: Secondary | ICD-10-CM | POA: Diagnosis not present

## 2020-04-02 DIAGNOSIS — F17200 Nicotine dependence, unspecified, uncomplicated: Secondary | ICD-10-CM | POA: Diagnosis not present

## 2020-04-02 DIAGNOSIS — Z2821 Immunization not carried out because of patient refusal: Secondary | ICD-10-CM

## 2020-04-02 MED ORDER — NICOTINE 21 MG/24HR TD PT24: 21.0000 mg | MEDICATED_PATCH | Freq: Every day | TRANSDERMAL | 1 refills | Status: DC

## 2020-04-02 MED ORDER — AMLODIPINE BESYLATE 10 MG PO TABS: 10.0000 mg | ORAL_TABLET | Freq: Every day | ORAL | 4 refills | Status: DC

## 2020-04-02 NOTE — Progress Notes (Signed)
Patient ID: Christina Miller, female    DOB: [birth year]  MRN: [id number]  CC: Hospitalization Follow-up (ED)   Subjective: Christina Miller is a 54 y.o. 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  HM:  Declines flu shot.  Plans to get COVID vaccine from [place]  Patient seen at urgent care 02/25/2020 with globus sensation.  She had no signs of peritonsillar abscess.  Trial of omeprazole was recommended.  Blood pressure was noted to be elevated on that visit.  Subsequently seen in the emergency room 03/04/2020 with the same concerns.  Exam was unremarkable.  Recommended that she see ENT.  ENT Dr. Redmond Baseman on the ninth of this month.  Laryngoscope was done and demonstrated no concerning findings.  He recommended smoking cessation.  He also recommended increasing omeprazole to twice a day in the event that her symptoms were related to reflux.  Today she tells me she is doing much better.  She denies any painful swallowing or food being stuck in the throat or chest.  On last visit I referred her to the neurologist Dr.Sater for further evaluation of her reported history of MS.  He reviewed her MRI of the head from 2006 that showed white matter abnormalities.  His assessment was that the white matter abnormalities were more consistent with chronic microvascular ischemic change than demyelination.  You ordered visual evoked potential test but look like this was not done.  He ordered MRI of the brain and cervical spine.  The MRI of the brain showed extensive flair hyperintense foci in the cerebral hemispheres.  The pattern he believes is more consistent with chronic microvascular ischemic changes than MS though cannot rule out that there is a combination.  MRI of the cervical spine showed mild degenerative  changes but no spinal cord lesions.  He recommend control of vascular risk factors including hypertension.  Advised to quit smoking.   HTN: When I saw her back in February, blood pressure was elevated.  I recommended that she restart the Norvasc 5 mg.  However she admits that she was not taking it until she restarted several days ago upon the recommendation of the neurologist.  She tries to limit salt in the foods.  She denies any headaches or dizziness.  Tob dep: Currently smokes 1/2-1 pk a day.  Wants to try to quit.   COPD: No recent flares during the summer.  Uses albuterol inhaler about once a wk Patient Active Problem List   Diagnosis Date Noted  . White matter abnormality on MRI of brain 09/11/2019  . Numbness 09/11/2019  . Vision disturbance 09/11/2019  . Asymptomatic varicose veins of left lower extremity 08/22/2019  . Colonic diverticular abscess 02/13/2019  . Anxiety state 01/30/2019  . Tubular adenoma of colon 07/02/2018  . Financial difficulties 06/26/2018  . Tobacco dependence 03/26/2018  . Essential hypertension 03/26/2018  . Multiple sclerosis ([place]) 03/26/2018  . Anemia 03/26/2018  . 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 Intra-abdominal abscess ([place]) 03/09/2018  . COPD (chronic obstructive pulmonary disease) ([place]) 03/09/2018  . Diverticulitis of large intestine with perforation and abscess 02/24/2018  . Protrusion of lumbar intervertebral disc 04/21/2016  . Breast lump on left side at 7 o'clock position 06/22/2012  . Tobacco abuse 06/02/2011  . Perimenopause 06/02/2011  . Hematuria, microscopic 06/02/2011     Current Outpatient Medications  on File Prior to Visit  Medication Sig Dispense Refill  . omeprazole (PRILOSEC) 40 MG capsule Take by mouth.    Marland Kitchen albuterol (VENTOLIN HFA) 108 (90 Base) MCG/ACT inhaler Inhale 2 puffs into the lungs every 6 (six) hours as needed for wheezing or shortness of breath. 8 g 3  . famotidine (PEPCID) 20 MG tablet Take 1 tablet (20 mg  total) by mouth 2 (two) times daily as needed for heartburn or indigestion. 30 tablet 0   No current facility-administered medications on file prior to visit.    Allergies  Allergen Reactions  . Hydrocodone Nausea And Vomiting  . Bactrim [Sulfamethoxazole-Trimethoprim] Rash    Social History   Socioeconomic History  . Marital status: Legally Separated    Spouse name: Not on file  . Number of children: 2  . Years of education: 9  . Highest education level: Not on file  Occupational History  . Occupation: unemployed  Tobacco Use  . Smoking status: Current Every Day Smoker    Packs/day: 1.00    Years: 15.00    Pack years: 15.00    Types: Cigarettes  . Smokeless tobacco: Former Network engineer  . Vaping Use: Never used  Substance and Sexual Activity  . Alcohol use: Yes  . Drug use: No  . Sexual activity: Yes    Birth control/protection: Surgical  Other Topics Concern  . Not on file  Social History Narrative    Right handed    Caffeine use: coffee every morning (1 cup)   Pepsi   Lives with boyfriend, Virl Cagey   Social Determinants of Health   Financial Resource Strain:   . Difficulty of Paying Living Expenses: Not on file  Food Insecurity:   . Worried About Charity fundraiser in the Last Year: Not on file  . Ran Out of Food in the Last Year: Not on file  Transportation Needs:   . Lack of Transportation (Medical): Not on file  . Lack of Transportation (Non-Medical): Not on file  Physical Activity:   . Days of Exercise per Week: Not on file  . Minutes of Exercise per Session: Not on file  Stress:   . Feeling of Stress : Not on file  Social Connections:   . Frequency of Communication with Friends and Family: Not on file  . Frequency of Social Gatherings with Friends and Family: Not on file  . Attends Religious Services: Not on file  . Active Member of Clubs or Organizations: Not on file  . Attends Archivist Meetings: Not on file  . Marital  Status: Not on file  Intimate Partner Violence:   . Fear of Current or Ex-Partner: Not on file  . Emotionally Abused: Not on file  . Physically Abused: Not on file  . Sexually Abused: Not on file    Family History  Problem Relation Age of Onset  . Diabetes Father   . Diabetes Sister   . Hypertension Sister   . Diabetes Brother   . Hypertension Brother   . Lung cancer Mother   . Colon cancer Neg Hx   . Stomach cancer Neg Hx   . Pancreatic cancer Neg Hx   . Esophageal cancer Neg Hx   . Rectal cancer Neg Hx     Past Surgical History:  Procedure Laterality Date  . ARM WOUND REPAIR / CLOSURE    . BREAST BIOPSY Left 07/03/2012  . COLPOSCOPY    . DILATION AND CURETTAGE OF UTERUS    .  IR CATHETER TUBE CHANGE  04/04/2018  . IR CATHETER TUBE CHANGE  05/16/2018  . IR CATHETER TUBE CHANGE  07/05/2018  . IR CATHETER TUBE CHANGE  09/21/2018  . IR RADIOLOGIST EVAL & MGMT  03/29/2018  . IR RADIOLOGIST EVAL & MGMT  04/12/2018  . IR RADIOLOGIST EVAL & MGMT  02/20/2019  . JP DRAIN TUBE    . PROCTOSCOPY N/A 01/30/2019   Procedure: RIGID PROCTOSCOPY;  Surgeon: [name], MD;  Location: [facility];  Service: General;  Laterality: N/A;  . ROBOT ASSISTED LAPAROSCOPIC PARTIAL COLECTOMY  01/30/2019   for diverticulitis.  LSO as well  . SALPINGOOPHORECTOMY Left 01/30/2019   LSO at time of sigmoid colectomy  . TUBAL LIGATION      ROS: Review of Systems Negative except as stated above  PHYSICAL EXAM: BP (!) 155/90   Pulse (!) 118   Temp 98.4 F (36.9 C)   Resp 16   Wt 101 lb 12.8 oz (46.2 kg)   LMP 06/20/2012   SpO2 94%   BMI 18.62 kg/m   Wt Readings from Last 3 Encounters:  04/02/20 101 lb 12.8 oz (46.2 kg)  03/04/20 105 lb (47.6 kg)  09/11/19 103 lb 12.8 oz (47.1 kg)  Blood pressure initially when checked by my CMA revealed systolic of 88 and she had increased pulse rate.  Patient states she was feeling nervous.  I repeated her blood pressure and it was 155/90 in both upper  extremities.  Physical Exam  General appearance - alert, well appearing, and in no distress Mental status - normal mood, behavior, speech, dress, motor activity, and thought processes Eyes - pupils equal and reactive, extraocular eye movements intact Mouth - mucous membranes moist, pharynx normal without lesions Neck - supple, no significant adenopathy Chest - clear to auscultation, no wheezes, rales or rhonchi, symmetric air entry Heart - normal rate, regular rhythm, normal S1, S2, no murmurs, rubs, clicks or gallops Extremities - peripheral pulses normal, no pedal edema, no clubbing or cyanosis   CMP Latest Ref Rng & Units 03/06/2020 02/29/2020 09/19/2019  Glucose 70 - 99 mg/dL 91 95 128(H)  BUN 6 - 20 mg/dL 14 9 14   Creatinine 0.44 - 1.00 mg/dL 0.55 0.60 0.62  Sodium 135 - 145 mmol/L 138 141 137  Potassium 3.5 - 5.1 mmol/L 3.8 3.5 3.8  Chloride 98 - 111 mmol/L 102 102 101  CO2 22 - 32 mmol/L 24 26 24   Calcium 8.9 - 10.3 mg/dL 8.8(L) 9.3 9.2  Total Protein 6.5 - 8.1 g/dL - - 7.3  Total Bilirubin 0.3 - 1.2 mg/dL - - 0.7  Alkaline Phos 38 - 126 U/L - - 55  AST 15 - 41 U/L - - 30  ALT 0 - 44 U/L - - 12   Lipid Panel     Component Value Date/Time   CHOL 230 (H) 08/22/2019 1621   TRIG 63 08/22/2019 1621   HDL 139 08/22/2019 1621   CHOLHDL 1.7 08/22/2019 1621   LDLCALC 80 08/22/2019 1621    CBC    Component Value Date/Time   WBC 8.3 03/06/2020 1328   RBC 3.98 03/06/2020 1328   HGB 13.9 03/06/2020 1328   HGB 12.3 03/26/2018 1026   HCT 41.7 03/06/2020 1328   HCT 36.7 03/26/2018 1026   PLT 181 03/06/2020 1328   PLT 285 03/26/2018 1026   MCV 104.8 (H) 03/06/2020 1328   MCV 92 03/26/2018 1026   MCV 101 (H) 03/19/2014 1829   MCH 34.9 (H)  03/06/2020 1328   MCHC 33.3 03/06/2020 1328   RDW 12.0 03/06/2020 1328   RDW 13.5 03/26/2018 1026   RDW 12.6 03/19/2014 1829   LYMPHSABS 2.6 09/19/2019 1528   MONOABS 0.7 09/19/2019 1528   EOSABS 0.1 09/19/2019 1528   BASOSABS 0.1  09/19/2019 1528    ASSESSMENT AND PLAN: 1. Essential hypertension Not at goal.  DASH diet discussed and encouraged.  Increase amlodipine to 10 mg daily. - amLODipine (NORVASC) 10 MG tablet; Take 1 tablet (10 mg total) by mouth daily.  Dispense: 30 tablet; Refill: 4  2. Tobacco dependence Advised to quit.  Discussed health risks associated with smoking.  Patient ready to give a trial of quitting.  We discussed ways to help her quit.  She wants to try the nicotine patches.  I went over the stepdown approach with her.  We will start with the 21 mg patches which she will use for 1 to 2 months then we will stepdown to the next level.  Less than 5 minutes spent on counseling.  3. Globus pharyngeus Doing better on PPI  4. Chronic obstructive pulmonary disease, unspecified COPD type ([place]) Advised to quit smoking to help prevent progression.  She will continue albuterol inhaler as needed - nicotine (NICODERM CQ - DOSED IN MG/24 HOURS) 21 mg/24hr patch; Place 1 patch (21 mg total) onto the skin daily.  Dispense: 28 patch; Refill: 1  5. Influenza vaccination declined This was offered and recommended.  Patient declined.  6. Educated about COVID-19 virus infection Strongly advised that she gets the COVID-19 vaccine series as soon as possible to help protect herself in the community.  Patient states she plans to do so.  7. Encounter for screening mammogram for malignant neoplasm of breast - MM Digital Screening; Future   Patient was given the opportunity to ask questions.  Patient verbalized understanding of the plan and was able to repeat key elements of the plan.   Orders Placed This Encounter  Procedures  . MM Digital Screening     Requested Prescriptions   Signed Prescriptions Disp Refills  . amLODipine (NORVASC) 10 MG tablet 30 tablet 4    Sig: Take 1 tablet (10 mg total) by mouth daily.  . nicotine (NICODERM CQ - DOSED IN MG/24 HOURS) 21 mg/24hr patch 28 patch 1    Sig: Place 1  patch (21 mg total) onto the skin daily.    Return in about 6 weeks (around 05/14/2020) for PAP.  Karle Plumber, MD, FACP

## 2020-04-02 NOTE — Patient Instructions (Addendum)
Your blood pressure is not at goal.  The goal is 130/80 or lower.  Recommend increasing the amlodipine to 10 mg daily.  Try to limit salt in the foods as much as possible.  I have prescribed the nicotine patches for you to help you quit smoking.   DASH Eating Plan DASH stands for "Dietary Approaches to Stop Hypertension." The DASH eating plan is a healthy eating plan that has been shown to reduce high blood pressure (hypertension). It may also reduce your risk for type 2 diabetes, heart disease, and stroke. The DASH eating plan may also help with weight loss. What are tips for following this plan?  General guidelines  Avoid eating more than 2,300 mg (milligrams) of salt (sodium) a day. If you have hypertension, you may need to reduce your sodium intake to 1,500 mg a day.  Limit alcohol intake to no more than 1 drink a day for nonpregnant women and 2 drinks a day for men. One drink equals 12 oz of beer, 5 oz of wine, or 1 oz of hard liquor.  Work with your health care provider to maintain a healthy body weight or to lose weight. Ask what an ideal weight is for you.  Get at least 30 minutes of exercise that causes your heart to beat faster (aerobic exercise) most days of the week. Activities may include walking, swimming, or biking.  Work with your health care provider or diet and nutrition specialist (dietitian) to adjust your eating plan to your individual calorie needs. Reading food labels   Check food labels for the amount of sodium per serving. Choose foods with less than 5 percent of the Daily Value of sodium. Generally, foods with less than 300 mg of sodium per serving fit into this eating plan.  To find whole grains, look for the word "whole" as the first word in the ingredient list. Shopping  Buy products labeled as "low-sodium" or "no salt added."  Buy fresh foods. Avoid canned foods and premade or frozen meals. Cooking  Avoid adding salt when cooking. Use salt-free  seasonings or herbs instead of table salt or sea salt. Check with your health care provider or pharmacist before using salt substitutes.  Do not fry foods. Cook foods using healthy methods such as baking, boiling, grilling, and broiling instead.  Cook with heart-healthy oils, such as olive, canola, soybean, or sunflower oil. Meal planning  Eat a balanced diet that includes: ? 5 or more servings of fruits and vegetables each day. At each meal, try to fill half of your plate with fruits and vegetables. ? Up to 6-8 servings of whole grains each day. ? Less than 6 oz of lean meat, poultry, or fish each day. A 3-oz serving of meat is about the same size as a deck of cards. One egg equals 1 oz. ? 2 servings of low-fat dairy each day. ? A serving of nuts, seeds, or beans 5 times each week. ? Heart-healthy fats. Healthy fats called Omega-3 fatty acids are found in foods such as flaxseeds and coldwater fish, like sardines, salmon, and mackerel.  Limit how much you eat of the following: ? Canned or prepackaged foods. ? Food that is high in trans fat, such as fried foods. ? Food that is high in saturated fat, such as fatty meat. ? Sweets, desserts, sugary drinks, and other foods with added sugar. ? Full-fat dairy products.  Do not salt foods before eating.  Try to eat at least 2 vegetarian meals each  week.  Eat more home-cooked food and less restaurant, buffet, and fast food.  When eating at a restaurant, ask that your food be prepared with less salt or no salt, if possible. What foods are recommended? The items listed may not be a complete list. Talk with your dietitian about what dietary choices are best for you. Grains Whole-grain or whole-wheat bread. Whole-grain or whole-wheat pasta. Brown rice. [place]. Bulgur. Whole-grain and low-sodium cereals. Pita bread. Low-fat, low-sodium crackers. Whole-wheat flour tortillas. Vegetables Fresh or frozen vegetables (raw, steamed, roasted, or  grilled). Low-sodium or reduced-sodium tomato and vegetable juice. Low-sodium or reduced-sodium tomato sauce and tomato paste. Low-sodium or reduced-sodium canned vegetables. Fruits All fresh, dried, or frozen fruit. Canned fruit in natural juice (without added sugar). Meat and other protein foods Skinless chicken or [place]. Ground chicken or [place]. Pork with fat trimmed off. Fish and seafood. Egg whites. Dried beans, peas, or lentils. Unsalted nuts, nut butters, and seeds. Unsalted canned beans. Lean cuts of beef with fat trimmed off. Low-sodium, lean deli meat. Dairy Low-fat (1%) or fat-free (skim) milk. Fat-free, low-fat, or reduced-fat cheeses. Nonfat, low-sodium ricotta or cottage cheese. Low-fat or nonfat yogurt. Low-fat, low-sodium cheese. Fats and oils Soft margarine without trans fats. Vegetable oil. Low-fat, reduced-fat, or light mayonnaise and salad dressings (reduced-sodium). Canola, safflower, olive, soybean, and sunflower oils. Avocado. Seasoning and other foods Herbs. Spices. Seasoning mixes without salt. Unsalted popcorn and pretzels. Fat-free sweets. What foods are not recommended? The items listed may not be a complete list. Talk with your dietitian about what dietary choices are best for you. Grains Baked goods made with fat, such as croissants, muffins, or some breads. Dry pasta or rice meal packs. Vegetables Creamed or fried vegetables. Vegetables in a cheese sauce. Regular canned vegetables (not low-sodium or reduced-sodium). Regular canned tomato sauce and paste (not low-sodium or reduced-sodium). Regular tomato and vegetable juice (not low-sodium or reduced-sodium). Angie Fava. Olives. Fruits Canned fruit in a light or heavy syrup. Fried fruit. Fruit in cream or butter sauce. Meat and other protein foods Fatty cuts of meat. Ribs. Fried meat. Berniece Salines. Sausage. Bologna and other processed lunch meats. Salami. Fatback. Hotdogs. Bratwurst. Salted nuts and seeds. Canned beans with  added salt. Canned or smoked fish. Whole eggs or egg yolks. Chicken or [place] with skin. Dairy Whole or 2% milk, cream, and half-and-half. Whole or full-fat cream cheese. Whole-fat or sweetened yogurt. Full-fat cheese. Nondairy creamers. Whipped toppings. Processed cheese and cheese spreads. Fats and oils Butter. Stick margarine. Lard. Shortening. Ghee. Bacon fat. Tropical oils, such as coconut, palm kernel, or palm oil. Seasoning and other foods Salted popcorn and pretzels. Onion salt, garlic salt, seasoned salt, table salt, and sea salt. Worcestershire sauce. Tartar sauce. Barbecue sauce. Teriyaki sauce. Soy sauce, including reduced-sodium. Steak sauce. Canned and packaged gravies. Fish sauce. Oyster sauce. Cocktail sauce. Horseradish that you find on the shelf. Ketchup. Mustard. Meat flavorings and tenderizers. Bouillon cubes. Hot sauce and Tabasco sauce. Premade or packaged marinades. Premade or packaged taco seasonings. Relishes. Regular salad dressings. Where to find more information:  National Heart, Lung, and [place]: https://wilson-eaton.com/  American Heart Association: www.heart.org Summary  The DASH eating plan is a healthy eating plan that has been shown to reduce high blood pressure (hypertension). It may also reduce your risk for type 2 diabetes, heart disease, and stroke.  With the DASH eating plan, you should limit salt (sodium) intake to 2,300 mg a day. If you have hypertension, you may need to reduce your  sodium intake to 1,500 mg a day.  When on the DASH eating plan, aim to eat more fresh fruits and vegetables, whole grains, lean proteins, low-fat dairy, and heart-healthy fats.  Work with your health care provider or diet and nutrition specialist (dietitian) to adjust your eating plan to your individual calorie needs. This information is not intended to replace advice given to you by your health care provider. Make sure you discuss any questions you have with your health care  provider. Document Revised: 06/16/2017 Document Reviewed: 06/27/2016 Elsevier Patient Education  2020 [employer].

## 2020-04-20 ENCOUNTER — Inpatient Hospital Stay: Admission: RE | Admit: 2020-04-20 | Payer: Medicaid Other | Source: Ambulatory Visit

## 2020-05-14 ENCOUNTER — Ambulatory Visit: Payer: Medicaid Other | Attending: Internal Medicine | Admitting: Internal Medicine

## 2020-05-14 ENCOUNTER — Encounter: Payer: Self-pay | Admitting: Internal Medicine

## 2020-05-14 ENCOUNTER — Other Ambulatory Visit (HOSPITAL_COMMUNITY)
Admission: RE | Admit: 2020-05-14 | Discharge: 2020-05-14 | Disposition: A | Discharge status: Home / Self Care | Payer: Medicaid Other | Source: Ambulatory Visit | Attending: Internal Medicine | Admitting: Internal Medicine

## 2020-05-14 ENCOUNTER — Other Ambulatory Visit: Payer: Self-pay

## 2020-05-14 VITALS — BP 139/100 | HR 105 | Resp 16 | Wt 103.2 lb

## 2020-05-14 DIAGNOSIS — G35 Multiple sclerosis: Secondary | ICD-10-CM | POA: Diagnosis not present

## 2020-05-14 DIAGNOSIS — F17200 Nicotine dependence, unspecified, uncomplicated: Secondary | ICD-10-CM

## 2020-05-14 DIAGNOSIS — Z124 Encounter for screening for malignant neoplasm of cervix: Secondary | ICD-10-CM | POA: Diagnosis not present

## 2020-05-14 DIAGNOSIS — I10 Essential (primary) hypertension: Secondary | ICD-10-CM | POA: Insufficient documentation

## 2020-05-14 DIAGNOSIS — K219 Gastro-esophageal reflux disease without esophagitis: Secondary | ICD-10-CM | POA: Diagnosis not present

## 2020-05-14 DIAGNOSIS — R233 Spontaneous ecchymoses: Secondary | ICD-10-CM

## 2020-05-14 DIAGNOSIS — F17210 Nicotine dependence, cigarettes, uncomplicated: Secondary | ICD-10-CM | POA: Insufficient documentation

## 2020-05-14 DIAGNOSIS — Z7901 Long term (current) use of anticoagulants: Secondary | ICD-10-CM | POA: Insufficient documentation

## 2020-05-14 DIAGNOSIS — Z79899 Other long term (current) drug therapy: Secondary | ICD-10-CM | POA: Diagnosis not present

## 2020-05-14 DIAGNOSIS — Z1231 Encounter for screening mammogram for malignant neoplasm of breast: Secondary | ICD-10-CM | POA: Insufficient documentation

## 2020-05-14 DIAGNOSIS — Z780 Asymptomatic menopausal state: Secondary | ICD-10-CM | POA: Diagnosis not present

## 2020-05-14 DIAGNOSIS — R238 Other skin changes: Secondary | ICD-10-CM

## 2020-05-14 DIAGNOSIS — J449 Chronic obstructive pulmonary disease, unspecified: Secondary | ICD-10-CM | POA: Insufficient documentation

## 2020-05-14 MED ORDER — NICOTINE POLACRILEX 2 MG MT GUM: 2.0000 mg | CHEWING_GUM | OROMUCOSAL | 0 refills | Status: DC | PRN

## 2020-05-14 NOTE — Patient Instructions (Signed)
Your blood pressure is not controlled.  Take the Norvasc every day as prescribed.

## 2020-05-14 NOTE — Addendum Note (Signed)
Addended by: Karle Plumber B on: 05/14/2020 06:05 PM   Modules accepted: Orders

## 2020-05-14 NOTE — Progress Notes (Addendum)
Patient ID: Christina Miller, female    DOB: 18-May-1966  MRN: [id number]  CC: Gynecologic Exam   Subjective: Christina Miller is a 54 y.o. 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  HTN: On last visit, we restarted her on Norvasc for elevated blood pressure.  Since then patient reports that she has been taking the medicine but not every day.  She has been taking it every other day.  Denies any side effects from the medication.  Trying to limit salt in the foods..   Tob dep: She has been using the nicotine patches and has found them helpful.  She has been able to cut down to half a pack a day.  She plans to continue using the patches with hopes of trying to quit.  Pt is G7P2 (rest were miscarriages 4, 1 abortion) Had abn paps in past with ? bx and cryotherapy.  She has not had a Pap smear in quite a number of years. No vaginal dischg or itching She is menopausal, no bleeding Checks breasts regularly.  Had breast bx on LT breast several yrs ago that was benign.  Referred for mammogram on last visit.  Patient states she canceled her appointment because she was sick that day.  She has not rescheduled as yet.  Active with same pattner female x 12 yrs Paternal aunt with breast CA No dysuria but some pressure feeling when urinating sometimes but not at this time  C/o easy bruising on arms for few mths.  She is not on any over-the-counter or herbal medications.  Reports that sometimes her posterior neck feels tight.  She denies any problems moving the neck.  No pain into the shoulders.  No chest pains. Patient Active Problem List   Diagnosis Date Noted  . Influenza vaccination declined 04/02/2020  . White matter abnormality on MRI of brain 09/11/2019  . Numbness 09/11/2019  . Vision disturbance 09/11/2019  .  Asymptomatic varicose veins of left lower extremity 08/22/2019  . Colonic diverticular abscess 02/13/2019  . Anxiety state 01/30/2019  . Tubular adenoma of colon 07/02/2018  . Financial difficulties 06/26/2018  . Tobacco dependence 03/26/2018  . Essential hypertension 03/26/2018  . Multiple sclerosis ([place]) 03/26/2018  . Anemia 03/26/2018  . 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 Intra-abdominal abscess ([place]) 03/09/2018  . COPD (chronic obstructive pulmonary disease) ([place]) 03/09/2018  . Diverticulitis of large intestine with perforation and abscess 02/24/2018  . Protrusion of lumbar intervertebral disc 04/21/2016  . Breast lump on left side at 7 o'clock position 06/22/2012  . Tobacco abuse 06/02/2011  . Perimenopause 06/02/2011  . Hematuria, microscopic 06/02/2011     Current Outpatient Medications on File Prior to Visit  Medication Sig Dispense Refill  . albuterol (VENTOLIN HFA) 108 (90 Base) MCG/ACT inhaler Inhale 2 puffs into the lungs every 6 (six) hours as needed for wheezing or shortness of breath. 8 g 3  . amLODipine (NORVASC) 10 MG tablet Take 1 tablet (10 mg total) by mouth daily. 30 tablet 4  . famotidine (PEPCID) 20 MG tablet Take 1 tablet (20 mg total) by mouth 2 (two) times daily as needed for heartburn or indigestion. 30 tablet 0  . nicotine (NICODERM CQ - DOSED IN MG/24 HOURS) 21 mg/24hr patch Place 1 patch (21 mg total) onto the skin daily. 28 patch 1  . omeprazole (PRILOSEC) 40 MG capsule  Take by mouth.     No current facility-administered medications on file prior to visit.    Allergies  Allergen Reactions  . Hydrocodone Nausea And Vomiting  . Bactrim [Sulfamethoxazole-Trimethoprim] Rash    Social History   Socioeconomic History  . Marital status: Legally Separated    Spouse name: Not on file  . Number of children: 2  . Years of education: 9  . Highest education level: Not on file  Occupational History  . Occupation: unemployed  Tobacco Use  . Smoking  status: Current Every Day Smoker    Packs/day: 1.00    Years: 15.00    Pack years: 15.00    Types: Cigarettes  . Smokeless tobacco: Former Network engineer  . Vaping Use: Never used  Substance and Sexual Activity  . Alcohol use: Yes  . Drug use: No  . Sexual activity: Yes    Birth control/protection: Surgical  Other Topics Concern  . Not on file  Social History Narrative    Right handed    Caffeine use: coffee every morning (1 cup)   Pepsi   Lives with boyfriend, Virl Cagey   Social Determinants of Health   Financial Resource Strain:   . Difficulty of Paying Living Expenses: Not on file  Food Insecurity:   . Worried About Charity fundraiser in the Last Year: Not on file  . Ran Out of Food in the Last Year: Not on file  Transportation Needs:   . Lack of Transportation (Medical): Not on file  . Lack of Transportation (Non-Medical): Not on file  Physical Activity:   . Days of Exercise per Week: Not on file  . Minutes of Exercise per Session: Not on file  Stress:   . Feeling of Stress : Not on file  Social Connections:   . Frequency of Communication with Friends and Family: Not on file  . Frequency of Social Gatherings with Friends and Family: Not on file  . Attends Religious Services: Not on file  . Active Member of Clubs or Organizations: Not on file  . Attends Archivist Meetings: Not on file  . Marital Status: Not on file  Intimate Partner Violence:   . Fear of Current or Ex-Partner: Not on file  . Emotionally Abused: Not on file  . Physically Abused: Not on file  . Sexually Abused: Not on file    Family History  Problem Relation Age of Onset  . Diabetes Father   . Diabetes Sister   . Hypertension Sister   . Diabetes Brother   . Hypertension Brother   . Lung cancer Mother   . Colon cancer Neg Hx   . Stomach cancer Neg Hx   . Pancreatic cancer Neg Hx   . Esophageal cancer Neg Hx   . Rectal cancer Neg Hx     Past Surgical History:    Procedure Laterality Date  . ARM WOUND REPAIR / CLOSURE    . BREAST BIOPSY Left 07/03/2012  . COLPOSCOPY    . DILATION AND CURETTAGE OF UTERUS    . IR CATHETER TUBE CHANGE  04/04/2018  . IR CATHETER TUBE CHANGE  05/16/2018  . IR CATHETER TUBE CHANGE  07/05/2018  . IR CATHETER TUBE CHANGE  09/21/2018  . IR RADIOLOGIST EVAL & MGMT  03/29/2018  . IR RADIOLOGIST EVAL & MGMT  04/12/2018  . IR RADIOLOGIST EVAL & MGMT  02/20/2019  . JP DRAIN TUBE    . PROCTOSCOPY N/A 01/30/2019  Procedure: RIGID PROCTOSCOPY;  Surgeon: [name], MD;  Location: [facility];  Service: General;  Laterality: N/A;  . ROBOT ASSISTED LAPAROSCOPIC PARTIAL COLECTOMY  01/30/2019   for diverticulitis.  LSO as well  . SALPINGOOPHORECTOMY Left 01/30/2019   LSO at time of sigmoid colectomy  . TUBAL LIGATION      ROS: Review of Systems Negative except as stated above  PHYSICAL EXAM: BP (!) 139/100   Pulse (!) 105   Resp 16   Wt 103 lb 3.2 oz (46.8 kg)   LMP 06/20/2012   SpO2 95%   BMI 18.88 kg/m   Physical Exam  General appearance - alert, well appearing, and in no distress Mental status - normal mood, behavior, speech, dress, motor activity, and thought processes Neck -no palpable masses.  Neck is supple.  No tenderness on palpation of the cervical spine.  Mild tightness of the muscles in the posterior neck. Breasts -RN Carilyn Goodpasture present for exam of the breast and pelvic exam.  Breasts appear normal, no suspicious masses, no skin or nipple changes or axillary nodes Pelvic - dry vaginal mucosa. normal external genitalia, vulva, , cervix, uterus and adnexa Skin -several small patches of ecchymosis on the forearms.   CMP Latest Ref Rng & Units 03/06/2020 02/29/2020 09/19/2019  Glucose 70 - 99 mg/dL 91 95 128(H)  BUN 6 - 20 mg/dL 14 9 14   Creatinine 0.44 - 1.00 mg/dL 0.55 0.60 0.62  Sodium 135 - 145 mmol/L 138 141 137  Potassium 3.5 - 5.1 mmol/L 3.8 3.5 3.8  Chloride 98 - 111 mmol/L 102 102 101  CO2 22 - 32  mmol/L 24 26 24   Calcium 8.9 - 10.3 mg/dL 8.8(L) 9.3 9.2  Total Protein 6.5 - 8.1 g/dL - - 7.3  Total Bilirubin 0.3 - 1.2 mg/dL - - 0.7  Alkaline Phos 38 - 126 U/L - - 55  AST 15 - 41 U/L - - 30  ALT 0 - 44 U/L - - 12   Lipid Panel     Component Value Date/Time   CHOL 230 (H) 08/22/2019 1621   TRIG 63 08/22/2019 1621   HDL 139 08/22/2019 1621   CHOLHDL 1.7 08/22/2019 1621   LDLCALC 80 08/22/2019 1621    CBC    Component Value Date/Time   WBC 8.3 03/06/2020 1328   RBC 3.98 03/06/2020 1328   HGB 13.9 03/06/2020 1328   HGB 12.3 03/26/2018 1026   HCT 41.7 03/06/2020 1328   HCT 36.7 03/26/2018 1026   PLT 181 03/06/2020 1328   PLT 285 03/26/2018 1026   MCV 104.8 (H) 03/06/2020 1328   MCV 92 03/26/2018 1026   MCV 101 (H) 03/19/2014 1829   MCH 34.9 (H) 03/06/2020 1328   MCHC 33.3 03/06/2020 1328   RDW 12.0 03/06/2020 1328   RDW 13.5 03/26/2018 1026   RDW 12.6 03/19/2014 1829   LYMPHSABS 2.6 09/19/2019 1528   MONOABS 0.7 09/19/2019 1528   EOSABS 0.1 09/19/2019 1528   BASOSABS 0.1 09/19/2019 1528    ASSESSMENT AND PLAN:  1. Pap smear for cervical cancer screening - Cytology - PAP  2. Essential hypertension Not at goal.  Encourage patient to take the Norvasc every day instead of every other day.  Discussed risks of acute cardiovascular events associated with uncontrolled blood pressure.  3. Tobacco dependence Commended her for cutting down.  She is trying to quit and I have encouraged her to do so.  She will continue using the nicotine patches.  She  is agreeable to trying the nicotine gum also to help decrease the cravings for the. Less than 5 mins spent on counseling - nicotine polacrilex (NICORETTE) 2 MG gum; Take 1 each (2 mg total) by mouth as needed for smoking cessation. Max 30 pieces/day  Dispense: 100 tablet; Refill: 0  4. Easy bruising - CBC - PT AND PTT  5. Encounter for screening mammogram for malignant neoplasm of breast Encourage patient to call and  reschedule her mammogram.  Recommend use of icy hot rub over-the-counter on the posterior neck.    Patient was given the opportunity to ask questions.  Patient verbalized understanding of the plan and was able to repeat key elements of the plan.   Orders Placed This Encounter  Procedures  . CBC  . PT AND PTT     Requested Prescriptions   Signed Prescriptions Disp Refills  . nicotine polacrilex (NICORETTE) 2 MG gum 100 tablet 0    Sig: Take 1 each (2 mg total) by mouth as needed for smoking cessation. Max 30 pieces/day    Return in about 4 months (around 09/14/2020).  Karle Plumber, MD, FACP

## 2020-05-15 LAB — CBC
Hematocrit: 45.1 % (ref 34.0–46.6)
Hemoglobin: 15.1 g/dL (ref 11.1–15.9)
MCH: 33.4 pg — ABNORMAL HIGH (ref 26.6–33.0)
MCHC: 33.5 g/dL (ref 31.5–35.7)
MCV: 100 fL — ABNORMAL HIGH (ref 79–97)
Platelets: 192 10*3/uL (ref 150–450)
RBC: 4.52 x10E6/uL (ref 3.77–5.28)
RDW: 11.5 % — ABNORMAL LOW (ref 11.7–15.4)
WBC: 11.3 10*3/uL — ABNORMAL HIGH (ref 3.4–10.8)

## 2020-05-15 LAB — PT AND PTT
INR: 0.9 (ref 0.9–1.2)
Prothrombin Time: 9.7 s (ref 9.1–12.0)
aPTT: 26 s (ref 24–33)

## 2020-05-19 LAB — CYTOLOGY - PAP
Comment: NEGATIVE
Diagnosis: NEGATIVE
High risk HPV: NEGATIVE

## 2020-09-14 ENCOUNTER — Other Ambulatory Visit: Payer: Self-pay

## 2020-09-14 ENCOUNTER — Ambulatory Visit: Payer: Medicaid Other | Attending: Internal Medicine | Admitting: Internal Medicine

## 2020-09-14 ENCOUNTER — Encounter: Payer: Self-pay | Admitting: Internal Medicine

## 2020-09-14 DIAGNOSIS — I10 Essential (primary) hypertension: Secondary | ICD-10-CM | POA: Diagnosis not present

## 2020-09-14 DIAGNOSIS — F17200 Nicotine dependence, unspecified, uncomplicated: Secondary | ICD-10-CM

## 2020-09-14 DIAGNOSIS — R221 Localized swelling, mass and lump, neck: Secondary | ICD-10-CM

## 2020-09-14 DIAGNOSIS — G4709 Other insomnia: Secondary | ICD-10-CM | POA: Diagnosis not present

## 2020-09-14 DIAGNOSIS — Z1231 Encounter for screening mammogram for malignant neoplasm of breast: Secondary | ICD-10-CM

## 2020-09-14 NOTE — Progress Notes (Signed)
Pt would like a referral to go see a neck doctor(spine) and for her numbness

## 2020-09-14 NOTE — Progress Notes (Signed)
Virtual Visit via Telephone Note  I connected with Christina Miller on 09/14/20 at  3:10 PM EST by telephone and verified that I am speaking with the correct person using two identifiers.  Location: Patient: home Provider: office   I discussed the limitations, risks, security and privacy concerns of performing an evaluation and management service by telephone and the availability of in person appointments. I also discussed with the patient that there may be a patient responsible charge related to this service. The patient expressed understanding and agreed to proceed.   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 Last seen 04/2020.  This is for chronic ds management.  HM:  Referred for MMG on last visit.  Reports she was not called. Does not plan to get COVID vaccine.  HTN:  Taking Norvasc consistently since she last saw me.  No device to check BP No CP/SOB  Tob dep: reports she is still trying to quit.  She did not care to elaborate on her progress since last visit.  Neck pain: feels lump on RT side of neck laterally x few mths. It has increased in size. Neck feels tight.   Next mth is the one yr anniverity of her sister's death.  Having problems sleeping for a few mths.  Gets in bed b/w 10-11 p.m. Tosses and turns but eventually falls asleep.  Wakes about 3 a.m; takes 1-2 hrs to fall asleep again. Drinks Pepsi right before bedtime each night.  Sometimes she drinks tea.  She turns off all lights and sounds when she gets in bed.   Outpatient Encounter Medications as of 09/14/2020  Medication Sig  . albuterol (VENTOLIN HFA) 108 (90 Base) MCG/ACT inhaler Inhale 2 puffs into the lungs every 6 (six) hours as needed for wheezing or shortness of breath.  Marland Kitchen amLODipine (NORVASC) 10 MG tablet Take 1 tablet (10 mg total) by mouth daily.  . famotidine  (PEPCID) 20 MG tablet Take 1 tablet (20 mg total) by mouth 2 (two) times daily as needed for heartburn or indigestion.  . nicotine (NICODERM CQ - DOSED IN MG/24 HOURS) 21 mg/24hr patch Place 1 patch (21 mg total) onto the skin daily.  . nicotine polacrilex (NICORETTE) 2 MG gum Take 1 each (2 mg total) by mouth as needed for smoking cessation. Max 30 pieces/day  . omeprazole (PRILOSEC) 40 MG capsule Take by mouth.   No facility-administered encounter medications on file as of 09/14/2020.      Observations/Objective: No direct observation done as this was a telephone encounter.  Assessment and Plan: 1. Essential hypertension Continue atorvastatin  2. Tobacco dependence Continue to encourage her to try to quit smoking.  Less than 5 minutes spent on counseling.  3. Other insomnia Good sleep hygiene discussed and encouraged.  Advised that she discontinue drinking caffeinated beverages at bedtime.  Recommend that she drinks her Pepsi or tea at least 4 hours before bedtime  4. Neck mass CMA to give her an appointment within the next 2 weeks for in person evaluation  5. Encounter for screening mammogram for malignant neoplasm of breast - MM Digital Screening; Future   Follow Up Instructions: Within the next 1 to 2 weeks for evaluation of neck mass.   I discussed the assessment and treatment plan with the patient. The patient was provided an opportunity to ask questions and all were answered. The patient agreed with the plan and  demonstrated an understanding of the instructions.   The patient was advised to call back or seek an in-person evaluation if the symptoms worsen or if the condition fails to improve as anticipated.  I provided 16 minutes of non-face-to-face time during this encounter.   Karle Plumber, MD

## 2020-09-15 ENCOUNTER — Encounter: Payer: Self-pay | Admitting: Internal Medicine

## 2020-09-15 ENCOUNTER — Other Ambulatory Visit: Payer: Self-pay

## 2020-09-15 ENCOUNTER — Ambulatory Visit: Payer: Medicaid Other | Attending: Internal Medicine | Admitting: Internal Medicine

## 2020-09-15 VITALS — BP 140/84 | HR 111 | Resp 16 | Wt 105.6 lb

## 2020-09-15 DIAGNOSIS — F17200 Nicotine dependence, unspecified, uncomplicated: Secondary | ICD-10-CM | POA: Diagnosis not present

## 2020-09-15 DIAGNOSIS — I10 Essential (primary) hypertension: Secondary | ICD-10-CM | POA: Diagnosis not present

## 2020-09-15 DIAGNOSIS — F17210 Nicotine dependence, cigarettes, uncomplicated: Secondary | ICD-10-CM | POA: Insufficient documentation

## 2020-09-15 DIAGNOSIS — Z2821 Immunization not carried out because of patient refusal: Secondary | ICD-10-CM | POA: Diagnosis not present

## 2020-09-15 DIAGNOSIS — G35 Multiple sclerosis: Secondary | ICD-10-CM | POA: Diagnosis not present

## 2020-09-15 DIAGNOSIS — Z79899 Other long term (current) drug therapy: Secondary | ICD-10-CM | POA: Diagnosis not present

## 2020-09-15 MED ORDER — HYDROCHLOROTHIAZIDE 12.5 MG PO TABS: 12.5000 mg | ORAL_TABLET | Freq: Every day | ORAL | 3 refills | Status: DC

## 2020-09-15 NOTE — Progress Notes (Signed)
Patient ID: Christina Miller, female    DOB: 1966-05-18  MRN: [id number]  CC: neck mass   Subjective: [name] is a 55 y.o. 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  I spoke with patient yesterday on a telephone visit.  She reported right-sided neck mass that she is had for a few months.  I brought her in today for an in person visit to evaluate this.  Today she tells me that she is not sure whether there is a mass there are not.  She denies any problems swallowing.  No pain in the neck.  No significant weight changes or night sweats.  HTN: Blood pressure elevated today.  She feels it is elevated because she had just smoked 2 cigarettes before coming in.  She takes Norvasc every day at bedtime.    Tob dep: she has cut back but not ready to quit  Patient Active Problem List   Diagnosis Date Noted  . Tetanus, diphtheria, and acellular pertussis (Tdap) vaccination declined 09/15/2020  . Influenza vaccination declined 04/02/2020  . White matter abnormality on MRI of brain 09/11/2019  . Numbness 09/11/2019  . Vision disturbance 09/11/2019  . Asymptomatic varicose veins of left lower extremity 08/22/2019  . Colonic diverticular abscess 02/13/2019  . Anxiety state 01/30/2019  . Tubular adenoma of colon 07/02/2018  . Financial difficulties 06/26/2018  . Tobacco dependence 03/26/2018  . Essential hypertension 03/26/2018  . Multiple sclerosis ([place]) 03/26/2018  . Anemia 03/26/2018  . 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 Intra-abdominal abscess ([place]) 03/09/2018  . COPD (chronic obstructive pulmonary disease) ([place]) 03/09/2018  . Diverticulitis of large intestine with perforation and abscess 02/24/2018  . Protrusion of lumbar intervertebral disc 04/21/2016  . Breast lump on left side at 7  o'clock position 06/22/2012  . Tobacco abuse 06/02/2011  . Perimenopause 06/02/2011  . Hematuria, microscopic 06/02/2011     Current Outpatient Medications on File Prior to Visit  Medication Sig Dispense Refill  . albuterol (VENTOLIN HFA) 108 (90 Base) MCG/ACT inhaler Inhale 2 puffs into the lungs every 6 (six) hours as needed for wheezing or shortness of breath. 8 g 3  . amLODipine (NORVASC) 10 MG tablet Take 1 tablet (10 mg total) by mouth daily. 30 tablet 4  . famotidine (PEPCID) 20 MG tablet Take 1 tablet (20 mg total) by mouth 2 (two) times daily as needed for heartburn or indigestion. 30 tablet 0  . nicotine (NICODERM CQ - DOSED IN MG/24 HOURS) 21 mg/24hr patch Place 1 patch (21 mg total) onto the skin daily. 28 patch 1  . nicotine polacrilex (NICORETTE) 2 MG gum Take 1 each (2 mg total) by mouth as needed for smoking cessation. Max 30 pieces/day 100 tablet 0  . omeprazole (PRILOSEC) 40 MG capsule Take by mouth.     No current facility-administered medications on file prior to visit.    Allergies  Allergen Reactions  . Hydrocodone Nausea And Vomiting  . Bactrim [Sulfamethoxazole-Trimethoprim] Rash    Social History   Socioeconomic History  . Marital status: Legally Separated    Spouse name: Not on file  . Number of children: 2  . Years of education: 9  . Highest education level: Not on file  Occupational History  . Occupation: unemployed  Tobacco Use  . Smoking status: Current Every Day Smoker    Packs/day:  1.00    Years: 15.00    Pack years: 15.00    Types: Cigarettes  . Smokeless tobacco: Former Network engineer  . Vaping Use: Never used  Substance and Sexual Activity  . Alcohol use: Yes  . Drug use: No  . Sexual activity: Yes    Birth control/protection: Surgical  Other Topics Concern  . Not on file  Social History Narrative    Right handed    Caffeine use: coffee every morning (1 cup)   Pepsi   Lives with boyfriend, Christina Miller   Social Determinants  of Health   Financial Resource Strain: Not on file  Food Insecurity: Not on file  Transportation Needs: Not on file  Physical Activity: Not on file  Stress: Not on file  Social Connections: Not on file  Intimate Partner Violence: Not on file    Family History  Problem Relation Age of Onset  . Diabetes Father   . Diabetes Sister   . Hypertension Sister   . Diabetes Brother   . Hypertension Brother   . Lung cancer Mother   . Colon cancer Neg Hx   . Stomach cancer Neg Hx   . Pancreatic cancer Neg Hx   . Esophageal cancer Neg Hx   . Rectal cancer Neg Hx     Past Surgical History:  Procedure Laterality Date  . ARM WOUND REPAIR / CLOSURE    . BREAST BIOPSY Left 07/03/2012  . COLPOSCOPY    . DILATION AND CURETTAGE OF UTERUS    . IR CATHETER TUBE CHANGE  04/04/2018  . IR CATHETER TUBE CHANGE  05/16/2018  . IR CATHETER TUBE CHANGE  07/05/2018  . IR CATHETER TUBE CHANGE  09/21/2018  . IR RADIOLOGIST EVAL & MGMT  03/29/2018  . IR RADIOLOGIST EVAL & MGMT  04/12/2018  . IR RADIOLOGIST EVAL & MGMT  02/20/2019  . JP DRAIN TUBE    . PROCTOSCOPY N/A 01/30/2019   Procedure: RIGID PROCTOSCOPY;  Surgeon: [name], MD;  Location: [facility];  Service: General;  Laterality: N/A;  . ROBOT ASSISTED LAPAROSCOPIC PARTIAL COLECTOMY  01/30/2019   for diverticulitis.  LSO as well  . SALPINGOOPHORECTOMY Left 01/30/2019   LSO at time of sigmoid colectomy  . TUBAL LIGATION      ROS: Review of Systems Negative except as stated above  PHYSICAL EXAM: BP 140/84   Pulse (!) 111   Resp 16   Wt 105 lb 9.6 oz (47.9 kg)   LMP 06/20/2012   SpO2 96%   BMI 19.31 kg/m   Physical Exam  General appearance - alert, well appearing, and in no distress Mental status - normal mood, behavior, speech, dress, motor activity, and thought processes Neck -supple.  No palpable mass.  Patient was not able to pinpoint any mass herself.  No cervical lymphadenopathy.  No supraclavicular lymphadenopathy.  No thyroid  enlargement.   Chest - clear to auscultation, no wheezes, rales or rhonchi, symmetric air entry Heart - normal rate, regular rhythm, normal S1, S2, no murmurs, rubs, clicks or gallops   CMP Latest Ref Rng & Units 03/06/2020 02/29/2020 09/19/2019  Glucose 70 - 99 mg/dL 91 95 128(H)  BUN 6 - 20 mg/dL 14 9 14   Creatinine 0.44 - 1.00 mg/dL 0.55 0.60 0.62  Sodium 135 - 145 mmol/L 138 141 137  Potassium 3.5 - 5.1 mmol/L 3.8 3.5 3.8  Chloride 98 - 111 mmol/L 102 102 101  CO2 22 - 32 mmol/L 24 26 24  Calcium 8.9 - 10.3 mg/dL 8.8(L) 9.3 9.2  Total Protein 6.5 - 8.1 g/dL - - 7.3  Total Bilirubin 0.3 - 1.2 mg/dL - - 0.7  Alkaline Phos 38 - 126 U/L - - 55  AST 15 - 41 U/L - - 30  ALT 0 - 44 U/L - - 12   Lipid Panel     Component Value Date/Time   CHOL 230 (H) 08/22/2019 1621   TRIG 63 08/22/2019 1621   HDL 139 08/22/2019 1621   CHOLHDL 1.7 08/22/2019 1621   LDLCALC 80 08/22/2019 1621    CBC    Component Value Date/Time   WBC 11.3 (H) 05/14/2020 1556   WBC 8.3 03/06/2020 1328   RBC 4.52 05/14/2020 1556   RBC 3.98 03/06/2020 1328   HGB 15.1 05/14/2020 1556   HCT 45.1 05/14/2020 1556   PLT 192 05/14/2020 1556   MCV 100 (H) 05/14/2020 1556   MCV 101 (H) 03/19/2014 1829   MCH 33.4 (H) 05/14/2020 1556   MCH 34.9 (H) 03/06/2020 1328   MCHC 33.5 05/14/2020 1556   MCHC 33.3 03/06/2020 1328   RDW 11.5 (L) 05/14/2020 1556   RDW 12.6 03/19/2014 1829   LYMPHSABS 2.6 09/19/2019 1528   MONOABS 0.7 09/19/2019 1528   EOSABS 0.1 09/19/2019 1528   BASOSABS 0.1 09/19/2019 1528    ASSESSMENT AND PLAN: 1. Essential hypertension Not at goal. Add HCTZ.  After being on the medicine for 1 week, she should return to the lab to have blood test done to check kidney function and potassium level - hydrochlorothiazide (HYDRODIURIL) 12.5 MG tablet; Take 1 tablet (12.5 mg total) by mouth daily.  Dispense: 30 tablet; Refill: 3 - Basic Metabolic Panel; Future  2. Tobacco dependence Advised to quit.   Patient states she has cut back but not ready to commit to quitting.  Discussed health risks associated with smoking.  Less than 5 minutes spent on counseling.  3. Tetanus, diphtheria, and acellular pertussis (Tdap) vaccination declined  4.  Reassurance given to patient.  At this time no palpable masses appreciated on neck exam.    Patient was given the opportunity to ask questions.  Patient verbalized understanding of the plan and was able to repeat key elements of the plan.   Orders Placed This Encounter  Procedures  . Basic Metabolic Panel     Requested Prescriptions   Signed Prescriptions Disp Refills  . hydrochlorothiazide (HYDRODIURIL) 12.5 MG tablet 30 tablet 3    Sig: Take 1 tablet (12.5 mg total) by mouth daily.    No follow-ups on file.  Karle Plumber, MD, FACP

## 2020-09-15 NOTE — Patient Instructions (Signed)
I did not detect a mass on the right side of your neck on physical exam.  We have added a new blood pressure medication called hydrochlorothiazide for you to take daily.  Continue the amlodipine.  After you have been on the hydrochlorothiazide for 1 week, please return to the lab to have blood test done to check your potassium level.

## 2020-09-22 ENCOUNTER — Other Ambulatory Visit: Payer: Medicaid Other

## 2020-12-22 ENCOUNTER — Emergency Department (HOSPITAL_COMMUNITY)
Admission: EM | Admit: 2020-12-22 | Discharge: 2020-12-22 | Disposition: A | Discharge status: Home / Self Care | Payer: Medicaid Other | Attending: Emergency Medicine | Admitting: Emergency Medicine

## 2020-12-22 ENCOUNTER — Other Ambulatory Visit: Payer: Self-pay

## 2020-12-22 ENCOUNTER — Emergency Department (HOSPITAL_COMMUNITY): Payer: Medicaid Other

## 2020-12-22 DIAGNOSIS — R0602 Shortness of breath: Secondary | ICD-10-CM | POA: Insufficient documentation

## 2020-12-22 DIAGNOSIS — J449 Chronic obstructive pulmonary disease, unspecified: Secondary | ICD-10-CM | POA: Insufficient documentation

## 2020-12-22 DIAGNOSIS — R0789 Other chest pain: Secondary | ICD-10-CM | POA: Diagnosis not present

## 2020-12-22 DIAGNOSIS — Z5321 Procedure and treatment not carried out due to patient leaving prior to being seen by health care provider: Secondary | ICD-10-CM | POA: Insufficient documentation

## 2020-12-22 DIAGNOSIS — M549 Dorsalgia, unspecified: Secondary | ICD-10-CM | POA: Diagnosis not present

## 2020-12-22 DIAGNOSIS — R000 Tachycardia, unspecified: Secondary | ICD-10-CM | POA: Diagnosis not present

## 2020-12-22 LAB — CBC WITH DIFFERENTIAL/PLATELET
Abs Immature Granulocytes: 0.02 10*3/uL (ref 0.00–0.07)
Basophils Absolute: 0.1 10*3/uL (ref 0.0–0.1)
Basophils Relative: 1 %
Eosinophils Absolute: 0.4 10*3/uL (ref 0.0–0.5)
Eosinophils Relative: 4 %
HCT: 43.8 % (ref 36.0–46.0)
Hemoglobin: 14.9 g/dL (ref 12.0–15.0)
Immature Granulocytes: 0 %
Lymphocytes Relative: 43 %
Lymphs Abs: 3.6 10*3/uL (ref 0.7–4.0)
MCH: 35.1 pg — ABNORMAL HIGH (ref 26.0–34.0)
MCHC: 34 g/dL (ref 30.0–36.0)
MCV: 103.1 fL — ABNORMAL HIGH (ref 80.0–100.0)
Monocytes Absolute: 0.7 10*3/uL (ref 0.1–1.0)
Monocytes Relative: 8 %
Neutro Abs: 3.6 10*3/uL (ref 1.7–7.7)
Neutrophils Relative %: 44 %
Platelets: 201 10*3/uL (ref 150–400)
RBC: 4.25 MIL/uL (ref 3.87–5.11)
RDW: 13.2 % (ref 11.5–15.5)
WBC: 8.3 10*3/uL (ref 4.0–10.5)
nRBC: 0 % (ref 0.0–0.2)

## 2020-12-22 LAB — BRAIN NATRIURETIC PEPTIDE: B Natriuretic Peptide: 56.5 pg/mL (ref 0.0–100.0)

## 2020-12-22 LAB — TROPONIN I (HIGH SENSITIVITY): Troponin I (High Sensitivity): 10 ng/L (ref <18)

## 2020-12-22 MED ORDER — MORPHINE SULFATE (PF) 4 MG/ML IV SOLN: 4.0000 mg | Freq: Once | INTRAVENOUS | Status: DC

## 2020-12-22 NOTE — ED Triage Notes (Signed)
Pt reports shortness of breath x 6 months and chest and back tightness x a few weeks. Hx of COPD. Also reports lumps in her arms. Pt rambling in triage about someone trying to have her committed, wanting her cell phone, etc.

## 2020-12-22 NOTE — ED Notes (Signed)
Pt leaving AMA, advised to return if symptoms worsen.

## 2020-12-22 NOTE — ED Provider Notes (Signed)
Emergency Medicine Provider Triage Evaluation Note  Christina Miller , a 55 y.o. female  was evaluated in triage.  Pt complains of shortness of breath.  She has COPD.  She says she has had tightness in her chest and back worsening over a few weeks and that she feels lumps in her chest and wants a CAT scan. She also reports lumps in her arms. Patient frequently interrupts, requesting for her cell phone and rambling about someone trying to get her committed. I explained to her multiple times that I do not have her cell phone or a phone for her.  We have not been given any information about her being a threat to herself or anyone else and she denies these with her concern being shortness of breath and wanting her cell phone..  Review of Systems  Positive: Shortness of breath worsening over 6 months Negative: Fevers  Physical Exam  BP 114/87 (BP Location: Left Arm)   Pulse (!) 108   Temp 98.2 F (36.8 C)   Resp 14   LMP 06/20/2012   SpO2 95%  Gen:   Awake, no distress   Resp:  Normal effort  MSK:   Moves extremities without difficulty  Other:  Patient frequently asking for phones, interrupting care of other patients to ask for phone.  Explained to her multiple times that we do not have her phone.  Medical Decision Making  Medically screening exam initiated at 6:02 PM.  Appropriate orders placed.  Darlyn Chamber Zappia was informed that the remainder of the evaluation will be completed by another provider, this initial triage assessment does not replace that evaluation, and the importance of remaining in the ED until their evaluation is complete.  Note: Portions of this report may have been transcribed using voice recognition software. Every effort was made to ensure accuracy; however, inadvertent computerized transcription errors may be present    Lorin Glass, PA-C 12/22/20 [place], [place], DO 12/22/20 1928

## 2020-12-22 NOTE — ED Notes (Signed)
PT didn't answer will recall

## 2020-12-22 NOTE — ED Notes (Signed)
Called for repeat VS x3, no response.

## 2021-01-04 ENCOUNTER — Ambulatory Visit: Payer: Medicaid Other

## 2021-01-11 ENCOUNTER — Ambulatory Visit: Payer: Medicaid Other

## 2021-01-21 ENCOUNTER — Ambulatory Visit: Payer: Medicaid Other | Admitting: Internal Medicine

## 2021-03-04 ENCOUNTER — Other Ambulatory Visit: Payer: Self-pay

## 2021-03-04 ENCOUNTER — Ambulatory Visit
Admission: RE | Admit: 2021-03-04 | Discharge: 2021-03-04 | Disposition: A | Discharge status: Home / Self Care | Payer: Medicaid Other | Source: Ambulatory Visit | Attending: Internal Medicine | Admitting: Internal Medicine

## 2021-03-04 DIAGNOSIS — Z1231 Encounter for screening mammogram for malignant neoplasm of breast: Secondary | ICD-10-CM

## 2021-03-05 ENCOUNTER — Other Ambulatory Visit: Payer: Self-pay | Admitting: Internal Medicine

## 2021-03-05 DIAGNOSIS — N63 Unspecified lump in unspecified breast: Secondary | ICD-10-CM

## 2021-03-18 ENCOUNTER — Other Ambulatory Visit: Payer: Self-pay

## 2021-03-18 ENCOUNTER — Ambulatory Visit: Payer: Medicaid Other | Attending: Internal Medicine | Admitting: Internal Medicine

## 2021-03-18 DIAGNOSIS — Z5329 Procedure and treatment not carried out because of patient's decision for other reasons: Secondary | ICD-10-CM

## 2021-03-18 DIAGNOSIS — Z91199 Patient's noncompliance with other medical treatment and regimen due to unspecified reason: Secondary | ICD-10-CM

## 2021-03-18 NOTE — Progress Notes (Signed)
Phone call was placed to patient for her telephone visit at 3:04 PM.  Patient's mailbox was full and I was unable to leave a message.  I tried calling her again at 3:06 PM and 3:33 PM.  Again I was unable to leave a message because mailbox is full.  I will send a message to our schedulers to have her rescheduled.

## 2021-03-24 ENCOUNTER — Telehealth: Payer: Self-pay

## 2021-03-24 NOTE — Telephone Encounter (Signed)
-----   Message from Ladell Pier, MD sent at 03/18/2021  3:34 PM EDT ----- Pt no-show for telephone appt.  According to scheduler's note, pt wanted an in-person visit.  Please give her next available in-person visit with me.

## 2021-03-24 NOTE — Telephone Encounter (Signed)
Pt has been scheduled and reminder has been mailed.

## 2021-04-26 ENCOUNTER — Ambulatory Visit: Payer: Medicaid Other | Admitting: Internal Medicine

## 2021-05-18 ENCOUNTER — Other Ambulatory Visit: Payer: Medicaid Other

## 2021-09-14 ENCOUNTER — Encounter: Payer: Self-pay | Admitting: Gastroenterology

## 2021-10-14 ENCOUNTER — Encounter: Payer: Self-pay | Admitting: Physician Assistant

## 2021-11-02 NOTE — Progress Notes (Deleted)
11/02/2021 Christina Miller [id number] 27-Sep-1965  Referring provider: Ladell Pier, MD Primary GI doctor: [name]  ASSESSMENT AND PLAN:   There are no diagnoses linked to this encounter.  ASSESSMENT: No diagnosis found.   PLAN:  No orders of the defined types were placed in this encounter.   Patient Care Team: Ladell Pier, MD as PCP - General (Internal Medicine) [name], MD as Consulting Physician (General Surgery) [name], MD as Consulting Physician (Gastroenterology)  HISTORY OF PRESENT ILLNESS: 56 y.o. 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 05/30/2018 colonoscopy with [name] normal ileum, three 5 to 9 mm polyps descending and transverse colon, no evidence of IBD.  Moderate diverticulosis in sigmoid with diverticular spasm and peridiverticular erythema, impacted diverticulum with purulent discharge suspicious of diverticulitis, nonbleeding internal hemorrhoids.  Recall colonoscopy 3 to 5 years.  Sent to Dr. 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   hydrochlorothiazide (HYDRODIURIL) 12.5 MG tablet, Take 1 tablet (12.5 mg total) by mouth daily.  Current Outpatient Medications (Respiratory):    albuterol (VENTOLIN HFA) 108 (90 Base) MCG/ACT inhaler, Inhale 2 puffs into the lungs every 6 (six) hours as needed for wheezing or shortness of breath.    Current Outpatient Medications (Other):    famotidine (PEPCID) 20 MG  tablet, Take 1 tablet (20 mg total) by mouth 2 (two) times daily as needed for heartburn or indigestion.   nicotine (NICODERM CQ - DOSED IN MG/24 HOURS) 21 mg/24hr patch, Place 1 patch (21 mg total) onto the skin daily.   nicotine polacrilex (NICORETTE) 2 MG gum, Take 1 each (2 mg total) by mouth as needed for smoking cessation. Max 30 pieces/day   omeprazole (PRILOSEC) 40 MG capsule, Take by mouth.  Medical History:  Past Medical History:  Diagnosis Date   Abnormal Pap smear    cryo   Allergy    Anemia    Bone fracture    ankle   Cancer ([place]) 1987   cervical Cancer   Colonic diverticular abscess 02/12/2019   COPD (chronic obstructive pulmonary disease) (HCC)    Emphysema    Emphysema of lung (HCC)    GERD (gastroesophageal reflux disease)    Headache(784.0)    Hypertension    Infection    urinary tract infection   MS (multiple sclerosis) (HCC)    Neuromuscular disorder (HCC)    hands and feet face and lip and muscle weakness due to MS   Pelvic abscess in female    Sepsis due to Escherichia coli (E. coli) ([place]) 03/09/2018   Allergies:  Allergies  Allergen Reactions   Hydrocodone Nausea And Vomiting   Bactrim [Sulfamethoxazole-Trimethoprim] Rash     Surgical History:  She  has a past surgical history that includes Tubal ligation; Arm wound repair / closure; Dilation and curettage of uterus; Colposcopy; Breast biopsy (Left, 07/03/2012); JP DRAIN TUBE; IR Radiologist Eval & Mgmt (03/29/2018); IR Catheter Tube Change (04/04/2018); IR Radiologist Eval & Mgmt (04/12/2018); IR Catheter Tube Change (05/16/2018); IR Catheter Tube Change (  07/05/2018); IR Catheter Tube Change (09/21/2018); Proctoscopy (N/A, 01/30/2019); Robot assisted laparoscopic partial colectomy (01/30/2019); Salpingoophorectomy (Left, 01/30/2019); and IR Radiologist Eval & Mgmt (02/20/2019). Family History:  Her family history includes Diabetes in her brother, father, and sister; Hypertension in her brother and sister; Lung  cancer in her mother. Social History:   reports that she has been smoking cigarettes. She has a 15.00 pack-year smoking history. She has quit using smokeless tobacco. She reports current alcohol use. She reports that she does not use drugs.  REVIEW OF SYSTEMS  : All other systems reviewed and negative except where noted in the History of Present Illness.   PHYSICAL EXAM: LMP 06/20/2012  General:   Pleasant, well developed female in no acute distress Head:  Normocephalic and atraumatic. Eyes: {sclerae:26738},conjunctive {conjuctiva:26739}  Heart:  {HEART EXAM HEM/ONC:21750} Pulm: Clear anteriorly; no wheezing Abdomen:   {BlankSingle:19197::"Distended","Ridged","Soft"}, {BlankSingle:19197::"Flat","Obese","Non-distended"} AB, {BlankSingle:19197::"Absent","Hyperactive, tinkling","Hypoactive","Sluggish","Active"} bowel sounds. {actendernessAB:27319} tenderness {anatomy; site abdomen:5010}. {BlankMultiple:19196::"Without guarding","With guarding","Without rebound","With rebound"}, No organomegaly appreciated. Extremities:  {With/Without:304960234} edema. Msk:  Symmetrical without gross deformities. Peripheral pulses intact.  Neurologic:  Alert and  oriented x4;  No focal deficits.  Skin:   Dry and intact without significant lesions or rashes. Psychiatric: Cooperative. Normal mood and affect.    Vladimir Crofts, PA-C 11:36 AM

## 2021-11-03 ENCOUNTER — Ambulatory Visit: Payer: Medicaid Other | Admitting: Physician Assistant

## 2021-11-08 NOTE — Progress Notes (Deleted)
11/08/2021 Christina Miller [id number] 08/21/1965  Referring provider: Ladell Pier, MD Primary GI doctor: [name]  ASSESSMENT AND PLAN:   There are no diagnoses linked to this encounter.  ASSESSMENT: No diagnosis found.   PLAN:  No orders of the defined types were placed in this encounter.   Patient Care Team: Ladell Pier, MD as PCP - General (Internal Medicine) [name], MD as Consulting Physician (General Surgery) [name], MD as Consulting Physician (Gastroenterology)  HISTORY OF PRESENT ILLNESS: 56 y.o. 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  05/30/2018 colonoscopy with [name] normal ileum, three 5 to 9 mm polyps descending and transverse colon, no evidence of IBD.  Moderate diverticulosis in sigmoid with diverticular spasm and peridiverticular erythema, impacted diverticulum with purulent discharge suspicious of diverticulitis, nonbleeding internal hemorrhoids.  Recall colonoscopy 3 to 5 years.  Sent to Dr. 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   hydrochlorothiazide (HYDRODIURIL) 12.5 MG tablet, Take 1 tablet (12.5 mg total) by mouth daily.  Current Outpatient Medications (Respiratory):    albuterol (VENTOLIN HFA) 108 (90 Base) MCG/ACT inhaler, Inhale 2 puffs into the lungs every 6 (six) hours as needed for wheezing or shortness of breath.    Current Outpatient Medications (Other):    famotidine (PEPCID) 20 MG  tablet, Take 1 tablet (20 mg total) by mouth 2 (two) times daily as needed for heartburn or indigestion.   nicotine (NICODERM CQ - DOSED IN MG/24 HOURS) 21 mg/24hr patch, Place 1 patch (21 mg total) onto the skin daily.   nicotine polacrilex (NICORETTE) 2 MG gum, Take 1 each (2 mg total) by mouth as needed for smoking cessation. Max 30 pieces/day   omeprazole (PRILOSEC) 40 MG capsule, Take by mouth.  Medical History:  Past Medical History:  Diagnosis Date   Abnormal Pap smear    cryo   Allergy    Anemia    Bone fracture    ankle   Cancer ([place]) 1987   cervical Cancer   Colonic diverticular abscess 02/12/2019   COPD (chronic obstructive pulmonary disease) (HCC)    Emphysema    Emphysema of lung (HCC)    GERD (gastroesophageal reflux disease)    Headache(784.0)    Hypertension    Infection    urinary tract infection   MS (multiple sclerosis) (HCC)    Neuromuscular disorder (HCC)    hands and feet face and lip and muscle weakness due to MS   Pelvic abscess in female    Sepsis due to Escherichia coli (E. coli) ([place]) 03/09/2018   Allergies:  Allergies  Allergen Reactions   Hydrocodone Nausea And Vomiting   Bactrim [Sulfamethoxazole-Trimethoprim] Rash     Surgical History:  She  has a past surgical history that includes Tubal ligation; Arm wound repair / closure; Dilation and curettage of uterus; Colposcopy; Breast biopsy (Left, 07/03/2012); JP DRAIN TUBE; IR Radiologist Eval & Mgmt (03/29/2018); IR Catheter Tube Change (04/04/2018); IR Radiologist Eval & Mgmt (04/12/2018); IR Catheter Tube Change (05/16/2018); IR Catheter  Tube Change (07/05/2018); IR Catheter Tube Change (09/21/2018); Proctoscopy (N/A, 01/30/2019); Robot assisted laparoscopic partial colectomy (01/30/2019); Salpingoophorectomy (Left, 01/30/2019); and IR Radiologist Eval & Mgmt (02/20/2019). Family History:  Her family history includes Diabetes in her brother, father, and sister; Hypertension in her brother and sister; Lung  cancer in her mother. Social History:   reports that she has been smoking cigarettes. She has a 15.00 pack-year smoking history. She has quit using smokeless tobacco. She reports current alcohol use. She reports that she does not use drugs.  REVIEW OF SYSTEMS  : All other systems reviewed and negative except where noted in the History of Present Illness.   PHYSICAL EXAM: LMP 06/20/2012  General:   Pleasant, well developed female in no acute distress Head:  Normocephalic and atraumatic. Eyes: {sclerae:26738},conjunctive {conjuctiva:26739}  Heart:  {HEART EXAM HEM/ONC:21750} Pulm: Clear anteriorly; no wheezing Abdomen:   {BlankSingle:19197::"Distended","Ridged","Soft"}, {BlankSingle:19197::"Flat","Obese","Non-distended"} AB, {BlankSingle:19197::"Absent","Hyperactive, tinkling","Hypoactive","Sluggish","Active"} bowel sounds. {actendernessAB:27319} tenderness {anatomy; site abdomen:5010}. {BlankMultiple:19196::"Without guarding","With guarding","Without rebound","With rebound"}, No organomegaly appreciated. Extremities:  {With/Without:304960234} edema. Msk:  Symmetrical without gross deformities. Peripheral pulses intact.  Neurologic:  Alert and  oriented x4;  No focal deficits.  Skin:   Dry and intact without significant lesions or rashes. Psychiatric: Cooperative. Normal mood and affect.    Vladimir Crofts, PA-C 1:40 PM

## 2021-11-10 ENCOUNTER — Ambulatory Visit: Payer: Medicaid Other | Admitting: Physician Assistant

## 2023-03-30 ENCOUNTER — Inpatient Hospital Stay (HOSPITAL_COMMUNITY)
Admission: EM | Admit: 2023-03-30 | Discharge: 2023-04-18 | DRG: 987 | Disposition: E | Payer: Medicaid Other | Attending: Emergency Medicine | Admitting: Emergency Medicine

## 2023-03-30 DIAGNOSIS — Z8249 Family history of ischemic heart disease and other diseases of the circulatory system: Secondary | ICD-10-CM

## 2023-03-30 DIAGNOSIS — N390 Urinary tract infection, site not specified: Secondary | ICD-10-CM | POA: Diagnosis present

## 2023-03-30 DIAGNOSIS — R1084 Generalized abdominal pain: Secondary | ICD-10-CM | POA: Diagnosis not present

## 2023-03-30 DIAGNOSIS — Y906 Blood alcohol level of 120-199 mg/100 ml: Secondary | ICD-10-CM | POA: Diagnosis present

## 2023-03-30 DIAGNOSIS — L8990 Pressure ulcer of unspecified site, unspecified stage: Secondary | ICD-10-CM | POA: Diagnosis present

## 2023-03-30 DIAGNOSIS — Z560 Unemployment, unspecified: Secondary | ICD-10-CM

## 2023-03-30 DIAGNOSIS — F17210 Nicotine dependence, cigarettes, uncomplicated: Secondary | ICD-10-CM | POA: Diagnosis present

## 2023-03-30 DIAGNOSIS — L89151 Pressure ulcer of sacral region, stage 1: Secondary | ICD-10-CM | POA: Diagnosis not present

## 2023-03-30 DIAGNOSIS — R680 Hypothermia, not associated with low environmental temperature: Secondary | ICD-10-CM | POA: Diagnosis not present

## 2023-03-30 DIAGNOSIS — R1030 Lower abdominal pain, unspecified: Secondary | ICD-10-CM | POA: Diagnosis not present

## 2023-03-30 DIAGNOSIS — K7682 Hepatic encephalopathy: Secondary | ICD-10-CM | POA: Diagnosis not present

## 2023-03-30 DIAGNOSIS — K7031 Alcoholic cirrhosis of liver with ascites: Secondary | ICD-10-CM | POA: Diagnosis not present

## 2023-03-30 DIAGNOSIS — Z87440 Personal history of urinary (tract) infections: Secondary | ICD-10-CM

## 2023-03-30 DIAGNOSIS — E871 Hypo-osmolality and hyponatremia: Secondary | ICD-10-CM | POA: Diagnosis present

## 2023-03-30 DIAGNOSIS — G35 Multiple sclerosis: Secondary | ICD-10-CM | POA: Diagnosis present

## 2023-03-30 DIAGNOSIS — K709 Alcoholic liver disease, unspecified: Secondary | ICD-10-CM

## 2023-03-30 DIAGNOSIS — Z635 Disruption of family by separation and divorce: Secondary | ICD-10-CM

## 2023-03-30 DIAGNOSIS — Z801 Family history of malignant neoplasm of trachea, bronchus and lung: Secondary | ICD-10-CM

## 2023-03-30 DIAGNOSIS — Z882 Allergy status to sulfonamides status: Secondary | ICD-10-CM

## 2023-03-30 DIAGNOSIS — Z881 Allergy status to other antibiotic agents status: Secondary | ICD-10-CM

## 2023-03-30 DIAGNOSIS — R54 Age-related physical debility: Secondary | ICD-10-CM | POA: Diagnosis present

## 2023-03-30 DIAGNOSIS — D684 Acquired coagulation factor deficiency: Secondary | ICD-10-CM | POA: Diagnosis not present

## 2023-03-30 DIAGNOSIS — K5900 Constipation, unspecified: Secondary | ICD-10-CM | POA: Diagnosis not present

## 2023-03-30 DIAGNOSIS — J439 Emphysema, unspecified: Secondary | ICD-10-CM | POA: Diagnosis present

## 2023-03-30 DIAGNOSIS — Z720 Tobacco use: Secondary | ICD-10-CM | POA: Diagnosis present

## 2023-03-30 DIAGNOSIS — R7989 Other specified abnormal findings of blood chemistry: Secondary | ICD-10-CM

## 2023-03-30 DIAGNOSIS — R188 Other ascites: Secondary | ICD-10-CM

## 2023-03-30 DIAGNOSIS — Z885 Allergy status to narcotic agent status: Secondary | ICD-10-CM

## 2023-03-30 DIAGNOSIS — I959 Hypotension, unspecified: Secondary | ICD-10-CM

## 2023-03-30 DIAGNOSIS — D62 Acute posthemorrhagic anemia: Secondary | ICD-10-CM | POA: Diagnosis not present

## 2023-03-30 DIAGNOSIS — B9620 Unspecified Escherichia coli [E. coli] as the cause of diseases classified elsewhere: Secondary | ICD-10-CM | POA: Diagnosis present

## 2023-03-30 DIAGNOSIS — E43 Unspecified severe protein-calorie malnutrition: Secondary | ICD-10-CM | POA: Diagnosis present

## 2023-03-30 DIAGNOSIS — R578 Other shock: Secondary | ICD-10-CM | POA: Diagnosis not present

## 2023-03-30 DIAGNOSIS — K7011 Alcoholic hepatitis with ascites: Secondary | ICD-10-CM | POA: Diagnosis present

## 2023-03-30 DIAGNOSIS — Z681 Body mass index (BMI) 19 or less, adult: Secondary | ICD-10-CM

## 2023-03-30 DIAGNOSIS — Z8541 Personal history of malignant neoplasm of cervix uteri: Secondary | ICD-10-CM

## 2023-03-30 DIAGNOSIS — K529 Noninfective gastroenteritis and colitis, unspecified: Secondary | ICD-10-CM | POA: Diagnosis present

## 2023-03-30 DIAGNOSIS — Z79899 Other long term (current) drug therapy: Secondary | ICD-10-CM

## 2023-03-30 DIAGNOSIS — E8809 Other disorders of plasma-protein metabolism, not elsewhere classified: Secondary | ICD-10-CM | POA: Diagnosis present

## 2023-03-30 DIAGNOSIS — K2100 Gastro-esophageal reflux disease with esophagitis, without bleeding: Secondary | ICD-10-CM | POA: Diagnosis present

## 2023-03-30 DIAGNOSIS — R933 Abnormal findings on diagnostic imaging of other parts of digestive tract: Secondary | ICD-10-CM | POA: Diagnosis present

## 2023-03-30 DIAGNOSIS — J9601 Acute respiratory failure with hypoxia: Secondary | ICD-10-CM | POA: Diagnosis not present

## 2023-03-30 DIAGNOSIS — I10 Essential (primary) hypertension: Secondary | ICD-10-CM | POA: Diagnosis present

## 2023-03-30 DIAGNOSIS — Z9049 Acquired absence of other specified parts of digestive tract: Secondary | ICD-10-CM

## 2023-03-30 DIAGNOSIS — K760 Fatty (change of) liver, not elsewhere classified: Secondary | ICD-10-CM | POA: Diagnosis present

## 2023-03-30 DIAGNOSIS — K2289 Other specified disease of esophagus: Secondary | ICD-10-CM | POA: Diagnosis present

## 2023-03-30 DIAGNOSIS — E872 Acidosis, unspecified: Secondary | ICD-10-CM | POA: Diagnosis present

## 2023-03-30 DIAGNOSIS — F1010 Alcohol abuse, uncomplicated: Secondary | ICD-10-CM | POA: Diagnosis present

## 2023-03-30 DIAGNOSIS — K7200 Acute and subacute hepatic failure without coma: Secondary | ICD-10-CM | POA: Diagnosis present

## 2023-03-30 DIAGNOSIS — J449 Chronic obstructive pulmonary disease, unspecified: Secondary | ICD-10-CM | POA: Diagnosis present

## 2023-03-30 DIAGNOSIS — N3000 Acute cystitis without hematuria: Secondary | ICD-10-CM | POA: Diagnosis present

## 2023-03-30 DIAGNOSIS — Z86010 Personal history of colonic polyps: Secondary | ICD-10-CM

## 2023-03-30 DIAGNOSIS — D539 Nutritional anemia, unspecified: Secondary | ICD-10-CM | POA: Diagnosis present

## 2023-03-30 DIAGNOSIS — E8770 Fluid overload, unspecified: Secondary | ICD-10-CM | POA: Diagnosis not present

## 2023-03-30 DIAGNOSIS — R64 Cachexia: Secondary | ICD-10-CM | POA: Diagnosis present

## 2023-03-30 DIAGNOSIS — K7040 Alcoholic hepatic failure without coma: Principal | ICD-10-CM | POA: Diagnosis present

## 2023-03-30 DIAGNOSIS — D6959 Other secondary thrombocytopenia: Secondary | ICD-10-CM | POA: Diagnosis not present

## 2023-03-30 DIAGNOSIS — Z8719 Personal history of other diseases of the digestive system: Secondary | ICD-10-CM

## 2023-03-30 DIAGNOSIS — K922 Gastrointestinal hemorrhage, unspecified: Secondary | ICD-10-CM | POA: Diagnosis not present

## 2023-03-30 DIAGNOSIS — F10239 Alcohol dependence with withdrawal, unspecified: Secondary | ICD-10-CM | POA: Diagnosis not present

## 2023-03-30 DIAGNOSIS — R000 Tachycardia, unspecified: Secondary | ICD-10-CM | POA: Diagnosis not present

## 2023-03-30 DIAGNOSIS — K2970 Gastritis, unspecified, without bleeding: Secondary | ICD-10-CM | POA: Diagnosis present

## 2023-03-30 DIAGNOSIS — L89321 Pressure ulcer of left buttock, stage 1: Secondary | ICD-10-CM | POA: Diagnosis present

## 2023-03-30 DIAGNOSIS — Z833 Family history of diabetes mellitus: Secondary | ICD-10-CM

## 2023-03-30 DIAGNOSIS — Z66 Do not resuscitate: Secondary | ICD-10-CM | POA: Diagnosis not present

## 2023-03-31 ENCOUNTER — Inpatient Hospital Stay (HOSPITAL_COMMUNITY): Payer: Medicaid Other

## 2023-03-31 ENCOUNTER — Emergency Department (HOSPITAL_COMMUNITY): Payer: Medicaid Other

## 2023-03-31 ENCOUNTER — Encounter (HOSPITAL_COMMUNITY): Payer: Self-pay

## 2023-03-31 ENCOUNTER — Other Ambulatory Visit: Payer: Self-pay

## 2023-03-31 DIAGNOSIS — K2289 Other specified disease of esophagus: Secondary | ICD-10-CM | POA: Diagnosis present

## 2023-03-31 DIAGNOSIS — R400 Somnolence: Secondary | ICD-10-CM | POA: Diagnosis not present

## 2023-03-31 DIAGNOSIS — R1084 Generalized abdominal pain: Secondary | ICD-10-CM | POA: Diagnosis present

## 2023-03-31 DIAGNOSIS — R109 Unspecified abdominal pain: Secondary | ICD-10-CM | POA: Diagnosis present

## 2023-03-31 DIAGNOSIS — K7011 Alcoholic hepatitis with ascites: Secondary | ICD-10-CM | POA: Diagnosis present

## 2023-03-31 DIAGNOSIS — N390 Urinary tract infection, site not specified: Secondary | ICD-10-CM | POA: Diagnosis present

## 2023-03-31 DIAGNOSIS — J9601 Acute respiratory failure with hypoxia: Secondary | ICD-10-CM | POA: Diagnosis not present

## 2023-03-31 DIAGNOSIS — Z452 Encounter for adjustment and management of vascular access device: Secondary | ICD-10-CM | POA: Diagnosis not present

## 2023-03-31 DIAGNOSIS — R578 Other shock: Secondary | ICD-10-CM | POA: Diagnosis not present

## 2023-03-31 DIAGNOSIS — K2100 Gastro-esophageal reflux disease with esophagitis, without bleeding: Secondary | ICD-10-CM | POA: Diagnosis present

## 2023-03-31 DIAGNOSIS — J439 Emphysema, unspecified: Secondary | ICD-10-CM | POA: Diagnosis present

## 2023-03-31 DIAGNOSIS — D72828 Other elevated white blood cell count: Secondary | ICD-10-CM | POA: Diagnosis not present

## 2023-03-31 DIAGNOSIS — I700 Atherosclerosis of aorta: Secondary | ICD-10-CM | POA: Diagnosis not present

## 2023-03-31 DIAGNOSIS — R933 Abnormal findings on diagnostic imaging of other parts of digestive tract: Secondary | ICD-10-CM | POA: Diagnosis present

## 2023-03-31 DIAGNOSIS — Y906 Blood alcohol level of 120-199 mg/100 ml: Secondary | ICD-10-CM | POA: Diagnosis present

## 2023-03-31 DIAGNOSIS — K7200 Acute and subacute hepatic failure without coma: Secondary | ICD-10-CM | POA: Diagnosis not present

## 2023-03-31 DIAGNOSIS — K7031 Alcoholic cirrhosis of liver with ascites: Secondary | ICD-10-CM | POA: Diagnosis not present

## 2023-03-31 DIAGNOSIS — G35 Multiple sclerosis: Secondary | ICD-10-CM | POA: Diagnosis present

## 2023-03-31 DIAGNOSIS — E43 Unspecified severe protein-calorie malnutrition: Secondary | ICD-10-CM | POA: Diagnosis present

## 2023-03-31 DIAGNOSIS — J9811 Atelectasis: Secondary | ICD-10-CM | POA: Diagnosis not present

## 2023-03-31 DIAGNOSIS — K709 Alcoholic liver disease, unspecified: Secondary | ICD-10-CM | POA: Insufficient documentation

## 2023-03-31 DIAGNOSIS — D684 Acquired coagulation factor deficiency: Secondary | ICD-10-CM | POA: Diagnosis not present

## 2023-03-31 DIAGNOSIS — K7030 Alcoholic cirrhosis of liver without ascites: Secondary | ICD-10-CM | POA: Diagnosis not present

## 2023-03-31 DIAGNOSIS — Z66 Do not resuscitate: Secondary | ICD-10-CM | POA: Diagnosis not present

## 2023-03-31 DIAGNOSIS — R188 Other ascites: Secondary | ICD-10-CM

## 2023-03-31 DIAGNOSIS — R7989 Other specified abnormal findings of blood chemistry: Secondary | ICD-10-CM | POA: Insufficient documentation

## 2023-03-31 DIAGNOSIS — F1010 Alcohol abuse, uncomplicated: Secondary | ICD-10-CM | POA: Diagnosis not present

## 2023-03-31 DIAGNOSIS — K7040 Alcoholic hepatic failure without coma: Secondary | ICD-10-CM | POA: Diagnosis present

## 2023-03-31 DIAGNOSIS — D539 Nutritional anemia, unspecified: Secondary | ICD-10-CM | POA: Diagnosis not present

## 2023-03-31 DIAGNOSIS — K921 Melena: Secondary | ICD-10-CM | POA: Diagnosis not present

## 2023-03-31 DIAGNOSIS — F10239 Alcohol dependence with withdrawal, unspecified: Secondary | ICD-10-CM | POA: Diagnosis not present

## 2023-03-31 DIAGNOSIS — K7682 Hepatic encephalopathy: Secondary | ICD-10-CM | POA: Diagnosis not present

## 2023-03-31 DIAGNOSIS — R000 Tachycardia, unspecified: Secondary | ICD-10-CM | POA: Diagnosis not present

## 2023-03-31 DIAGNOSIS — R112 Nausea with vomiting, unspecified: Secondary | ICD-10-CM | POA: Diagnosis not present

## 2023-03-31 DIAGNOSIS — E872 Acidosis, unspecified: Secondary | ICD-10-CM | POA: Diagnosis present

## 2023-03-31 DIAGNOSIS — D62 Acute posthemorrhagic anemia: Secondary | ICD-10-CM | POA: Diagnosis not present

## 2023-03-31 DIAGNOSIS — E861 Hypovolemia: Secondary | ICD-10-CM | POA: Diagnosis not present

## 2023-03-31 DIAGNOSIS — K760 Fatty (change of) liver, not elsewhere classified: Secondary | ICD-10-CM | POA: Diagnosis not present

## 2023-03-31 DIAGNOSIS — R935 Abnormal findings on diagnostic imaging of other abdominal regions, including retroperitoneum: Secondary | ICD-10-CM | POA: Diagnosis not present

## 2023-03-31 DIAGNOSIS — R64 Cachexia: Secondary | ICD-10-CM | POA: Diagnosis present

## 2023-03-31 DIAGNOSIS — E8809 Other disorders of plasma-protein metabolism, not elsewhere classified: Secondary | ICD-10-CM | POA: Diagnosis present

## 2023-03-31 DIAGNOSIS — Z681 Body mass index (BMI) 19 or less, adult: Secondary | ICD-10-CM | POA: Diagnosis not present

## 2023-03-31 DIAGNOSIS — E871 Hypo-osmolality and hyponatremia: Secondary | ICD-10-CM | POA: Diagnosis present

## 2023-03-31 DIAGNOSIS — K7689 Other specified diseases of liver: Secondary | ICD-10-CM | POA: Diagnosis not present

## 2023-03-31 DIAGNOSIS — K922 Gastrointestinal hemorrhage, unspecified: Secondary | ICD-10-CM | POA: Diagnosis not present

## 2023-03-31 DIAGNOSIS — I10 Essential (primary) hypertension: Secondary | ICD-10-CM | POA: Diagnosis present

## 2023-03-31 DIAGNOSIS — J90 Pleural effusion, not elsewhere classified: Secondary | ICD-10-CM | POA: Diagnosis not present

## 2023-03-31 DIAGNOSIS — L89151 Pressure ulcer of sacral region, stage 1: Secondary | ICD-10-CM | POA: Diagnosis not present

## 2023-03-31 DIAGNOSIS — K5900 Constipation, unspecified: Secondary | ICD-10-CM | POA: Diagnosis not present

## 2023-03-31 LAB — BASIC METABOLIC PANEL
Anion gap: 22 — ABNORMAL HIGH (ref 5–15)
Anion gap: 18 — ABNORMAL HIGH (ref 5–15)
Anion gap: 22 — ABNORMAL HIGH (ref 5–15)
Anion gap: 20 — ABNORMAL HIGH (ref 5–15)
BUN: 27 mg/dL — ABNORMAL HIGH (ref 6–20)
BUN: 25 mg/dL — ABNORMAL HIGH (ref 6–20)
BUN: 26 mg/dL — ABNORMAL HIGH (ref 6–20)
BUN: 25 mg/dL — ABNORMAL HIGH (ref 6–20)
CO2: 18 mmol/L — ABNORMAL LOW (ref 22–32)
CO2: 20 mmol/L — ABNORMAL LOW (ref 22–32)
CO2: 11 mmol/L — ABNORMAL LOW (ref 22–32)
CO2: 13 mmol/L — ABNORMAL LOW (ref 22–32)
Calcium: 7.9 mg/dL — ABNORMAL LOW (ref 8.9–10.3)
Calcium: 7.3 mg/dL — ABNORMAL LOW (ref 8.9–10.3)
Calcium: 7.1 mg/dL — ABNORMAL LOW (ref 8.9–10.3)
Calcium: 7.2 mg/dL — ABNORMAL LOW (ref 8.9–10.3)
Chloride: 78 mmol/L — ABNORMAL LOW (ref 98–111)
Chloride: 83 mmol/L — ABNORMAL LOW (ref 98–111)
Chloride: 89 mmol/L — ABNORMAL LOW (ref 98–111)
Chloride: 87 mmol/L — ABNORMAL LOW (ref 98–111)
Creatinine, Ser: 0.8 mg/dL (ref 0.44–1.00)
Creatinine, Ser: 0.65 mg/dL (ref 0.44–1.00)
Creatinine, Ser: 0.55 mg/dL (ref 0.44–1.00)
Creatinine, Ser: 0.7 mg/dL (ref 0.44–1.00)
GFR, Estimated: >60 mL/min (ref >60)
GFR, Estimated: >60 mL/min (ref >60)
GFR, Estimated: >60 mL/min (ref >60)
GFR, Estimated: >60 mL/min (ref >60)
Glucose, Bld: 90 mg/dL (ref 70–99)
Glucose, Bld: 71 mg/dL (ref 70–99)
Glucose, Bld: 35 mg/dL — CL (ref 70–99)
Glucose, Bld: 122 mg/dL — ABNORMAL HIGH (ref 70–99)
Potassium: 5.1 mmol/L (ref 3.5–5.1)
Potassium: 4.3 mmol/L (ref 3.5–5.1)
Potassium: 4.7 mmol/L (ref 3.5–5.1)
Potassium: 4.7 mmol/L (ref 3.5–5.1)
Sodium: 118 mmol/L — CL (ref 135–145)
Sodium: 121 mmol/L — ABNORMAL LOW (ref 135–145)
Sodium: 122 mmol/L — ABNORMAL LOW (ref 135–145)
Sodium: 120 mmol/L — ABNORMAL LOW (ref 135–145)

## 2023-03-31 LAB — URINALYSIS, W/ REFLEX TO CULTURE (INFECTION SUSPECTED)
Glucose, UA: NEGATIVE mg/dL
Ketones, ur: NEGATIVE mg/dL
Nitrite: NEGATIVE
Protein, ur: 30 mg/dL — AB
Specific Gravity, Urine: 1.032 — ABNORMAL HIGH (ref 1.005–1.030)
WBC, UA: >50 WBC/hpf (ref 0–5)
pH: 5 (ref 5.0–8.0)

## 2023-03-31 LAB — PROTEIN, PLEURAL OR PERITONEAL FLUID: Total protein, fluid: <3 g/dL

## 2023-03-31 LAB — CBC WITH DIFFERENTIAL/PLATELET
Abs Immature Granulocytes: 0.26 10*3/uL — ABNORMAL HIGH (ref 0.00–0.07)
Basophils Absolute: 0 10*3/uL (ref 0.0–0.1)
Basophils Relative: 0 %
Eosinophils Absolute: 0 10*3/uL (ref 0.0–0.5)
Eosinophils Relative: 0 %
HCT: 25.5 % — ABNORMAL LOW (ref 36.0–46.0)
Hemoglobin: 8.5 g/dL — ABNORMAL LOW (ref 12.0–15.0)
Immature Granulocytes: 1 %
Lymphocytes Relative: 9 %
Lymphs Abs: 2 10*3/uL (ref 0.7–4.0)
MCH: 37.6 pg — ABNORMAL HIGH (ref 26.0–34.0)
MCHC: 33.3 g/dL (ref 30.0–36.0)
MCV: 112.8 fL — ABNORMAL HIGH (ref 80.0–100.0)
Monocytes Absolute: 1.1 10*3/uL — ABNORMAL HIGH (ref 0.1–1.0)
Monocytes Relative: 5 %
Neutro Abs: 18.3 10*3/uL — ABNORMAL HIGH (ref 1.7–7.7)
Neutrophils Relative %: 85 %
Platelets: 272 10*3/uL (ref 150–400)
RBC: 2.26 MIL/uL — ABNORMAL LOW (ref 3.87–5.11)
RDW: 19.9 % — ABNORMAL HIGH (ref 11.5–15.5)
WBC: 21.7 10*3/uL — ABNORMAL HIGH (ref 4.0–10.5)
nRBC: 0 % (ref 0.0–0.2)

## 2023-03-31 LAB — TROPONIN I (HIGH SENSITIVITY): Troponin I (High Sensitivity): 17 ng/L (ref <18)

## 2023-03-31 LAB — RETICULOCYTES
Immature Retic Fract: 38.4 % — ABNORMAL HIGH (ref 2.3–15.9)
RBC.: 2.25 MIL/uL — ABNORMAL LOW (ref 3.87–5.11)
Retic Count, Absolute: 182.9 10*3/uL (ref 19.0–186.0)
Retic Ct Pct: 8.1 % — ABNORMAL HIGH (ref 0.4–3.1)

## 2023-03-31 LAB — ETHANOL: Alcohol, Ethyl (B): 158 mg/dL — ABNORMAL HIGH (ref <10)

## 2023-03-31 LAB — PROTIME-INR
INR: 1.3 — ABNORMAL HIGH (ref 0.8–1.2)
Prothrombin Time: 16.3 s — ABNORMAL HIGH (ref 11.4–15.2)

## 2023-03-31 LAB — LACTIC ACID, PLASMA
Lactic Acid, Venous: >9 mmol/L (ref 0.5–1.9)
Lactic Acid, Venous: 8.6 mmol/L (ref 0.5–1.9)
Lactic Acid, Venous: >9 mmol/L (ref 0.5–1.9)

## 2023-03-31 LAB — HEPATIC FUNCTION PANEL
ALT: 109 U/L — ABNORMAL HIGH (ref 0–44)
AST: 311 U/L — ABNORMAL HIGH (ref 15–41)
Albumin: 2.3 g/dL — ABNORMAL LOW (ref 3.5–5.0)
Alkaline Phosphatase: 335 U/L — ABNORMAL HIGH (ref 38–126)
Bilirubin, Direct: 7.2 mg/dL — ABNORMAL HIGH (ref 0.0–0.2)
Indirect Bilirubin: 5 mg/dL — ABNORMAL HIGH (ref 0.3–0.9)
Total Bilirubin: 12.2 mg/dL — ABNORMAL HIGH (ref 0.3–1.2)
Total Protein: 6.7 g/dL (ref 6.5–8.1)

## 2023-03-31 LAB — ALBUMIN, PLEURAL OR PERITONEAL FLUID: Albumin, Fluid: <1.5 g/dL

## 2023-03-31 LAB — HEPATITIS PANEL, ACUTE
HCV Ab: NONREACTIVE
Hep A IgM: NONREACTIVE
Hep B C IgM: NONREACTIVE
Hepatitis B Surface Ag: NONREACTIVE

## 2023-03-31 LAB — VITAMIN B12: Vitamin B-12: 868 pg/mL (ref 180–914)

## 2023-03-31 LAB — LACTATE DEHYDROGENASE, PLEURAL OR PERITONEAL FLUID: LD, Fluid: 43 U/L — ABNORMAL HIGH (ref 3–23)

## 2023-03-31 LAB — AMMONIA: Ammonia: 44 umol/L — ABNORMAL HIGH (ref 9–35)

## 2023-03-31 LAB — BODY FLUID CELL COUNT WITH DIFFERENTIAL
Eos, Fluid: 0 %
Lymphs, Fluid: 16 %
Monocyte-Macrophage-Serous Fluid: 62 % (ref 50–90)
Neutrophil Count, Fluid: 22 % (ref 0–25)
Total Nucleated Cell Count, Fluid: 30 uL (ref 0–1000)

## 2023-03-31 LAB — RAPID URINE DRUG SCREEN, HOSP PERFORMED
Amphetamines: NOT DETECTED
Barbiturates: NOT DETECTED
Benzodiazepines: NOT DETECTED
Cocaine: NOT DETECTED
Opiates: POSITIVE — AB
Tetrahydrocannabinol: NOT DETECTED

## 2023-03-31 LAB — IRON AND TIBC: Iron: 90 ug/dL (ref 28–170)

## 2023-03-31 LAB — OSMOLALITY: Osmolality: 291 mosm/kg (ref 275–295)

## 2023-03-31 LAB — MRSA NEXT GEN BY PCR, NASAL: MRSA by PCR Next Gen: NOT DETECTED

## 2023-03-31 LAB — POC OCCULT BLOOD, ED: Fecal Occult Bld: NEGATIVE

## 2023-03-31 LAB — ACETAMINOPHEN LEVEL: Acetaminophen (Tylenol), Serum: <10 ug/mL — ABNORMAL LOW (ref 10–30)

## 2023-03-31 LAB — GLUCOSE, CAPILLARY
Glucose-Capillary: 22 mg/dL — CL (ref 70–99)
Glucose-Capillary: 167 mg/dL — ABNORMAL HIGH (ref 70–99)

## 2023-03-31 LAB — FERRITIN: Ferritin: 3140 ng/mL — ABNORMAL HIGH (ref 11–307)

## 2023-03-31 LAB — HIV ANTIBODY (ROUTINE TESTING W REFLEX): HIV Screen 4th Generation wRfx: NONREACTIVE

## 2023-03-31 LAB — LIPASE, BLOOD: Lipase: 31 U/L (ref 11–51)

## 2023-03-31 LAB — FOLATE: Folate: 2.8 ng/mL — ABNORMAL LOW (ref >5.9)

## 2023-03-31 LAB — OSMOLALITY, URINE: Osmolality, Ur: 403 mosm/kg (ref 300–900)

## 2023-03-31 MED ORDER — SODIUM CHLORIDE 0.9 % IV SOLN
1.0000 g | Freq: Once | INTRAVENOUS | Status: AC
  Administered 2023-03-31: 1 g via INTRAVENOUS
  Filled 2023-03-31: qty 10

## 2023-03-31 MED ORDER — ALBUTEROL SULFATE (2.5 MG/3ML) 0.083% IN NEBU: 2.5000 mg | INHALATION_SOLUTION | RESPIRATORY_TRACT | Status: DC | PRN

## 2023-03-31 MED ORDER — IOHEXOL 300 MG/ML  SOLN
100.0000 mL | Freq: Once | INTRAMUSCULAR | Status: AC | PRN
  Administered 2023-03-31: 75 mL via INTRAVENOUS

## 2023-03-31 MED ORDER — THIAMINE MONONITRATE 100 MG PO TABS
100.0000 mg | ORAL_TABLET | Freq: Every day | ORAL | Status: DC
  Administered 2023-04-04 – 2023-04-07 (×4): 100 mg via ORAL
  Filled 2023-03-31 (×5): qty 1

## 2023-03-31 MED ORDER — METRONIDAZOLE 500 MG/100ML IV SOLN
500.0000 mg | Freq: Three times a day (TID) | INTRAVENOUS | Status: DC
  Administered 2023-03-31 (×2): 500 mg via INTRAVENOUS
  Filled 2023-03-31 (×2): qty 100

## 2023-03-31 MED ORDER — OXYCODONE HCL 5 MG PO TABS
5.0000 mg | ORAL_TABLET | ORAL | Status: DC | PRN
  Administered 2023-03-31: 5 mg via ORAL
  Filled 2023-03-31: qty 1

## 2023-03-31 MED ORDER — ONDANSETRON HCL 4 MG/2ML IJ SOLN
4.0000 mg | Freq: Four times a day (QID) | INTRAMUSCULAR | Status: DC | PRN
  Administered 2023-04-06 – 2023-04-07 (×2): 4 mg via INTRAVENOUS
  Filled 2023-03-31 (×2): qty 2

## 2023-03-31 MED ORDER — ONDANSETRON HCL 4 MG/2ML IJ SOLN
4.0000 mg | Freq: Once | INTRAMUSCULAR | Status: AC
  Administered 2023-03-31: 4 mg via INTRAVENOUS
  Filled 2023-03-31: qty 2

## 2023-03-31 MED ORDER — SODIUM CHLORIDE 0.9 % IV SOLN: 2.0000 g | INTRAVENOUS | Status: DC

## 2023-03-31 MED ORDER — PANTOPRAZOLE SODIUM 40 MG IV SOLR
40.0000 mg | INTRAVENOUS | Status: DC
  Administered 2023-03-31 – 2023-04-03 (×4): 40 mg via INTRAVENOUS
  Filled 2023-03-31 (×4): qty 10

## 2023-03-31 MED ORDER — METRONIDAZOLE 500 MG/100ML IV SOLN
500.0000 mg | Freq: Two times a day (BID) | INTRAVENOUS | Status: DC
  Administered 2023-03-31 – 2023-04-01 (×2): 500 mg via INTRAVENOUS
  Filled 2023-03-31 (×2): qty 100

## 2023-03-31 MED ORDER — SODIUM CHLORIDE (PF) 0.9 % IJ SOLN
INTRAMUSCULAR | Status: AC
  Filled 2023-03-31: qty 50

## 2023-03-31 MED ORDER — SODIUM CHLORIDE 0.9 % IV SOLN: INTRAVENOUS | Status: DC

## 2023-03-31 MED ORDER — LORAZEPAM 1 MG PO TABS
1.0000 mg | ORAL_TABLET | ORAL | Status: AC | PRN
  Administered 2023-04-02: 1 mg via ORAL
  Filled 2023-03-31: qty 1

## 2023-03-31 MED ORDER — HYDROMORPHONE HCL 1 MG/ML IJ SOLN
1.0000 mg | Freq: Once | INTRAMUSCULAR | Status: AC
  Administered 2023-03-31: 1 mg via INTRAVENOUS
  Filled 2023-03-31: qty 1

## 2023-03-31 MED ORDER — LIDOCAINE HCL 1 % IJ SOLN
INTRAMUSCULAR | Status: AC
  Filled 2023-03-31: qty 20

## 2023-03-31 MED ORDER — SODIUM CHLORIDE 0.9 % IV SOLN
2.0000 g | INTRAVENOUS | Status: DC
  Filled 2023-03-31: qty 20

## 2023-03-31 MED ORDER — TRAZODONE HCL 50 MG PO TABS
25.0000 mg | ORAL_TABLET | Freq: Every evening | ORAL | Status: DC | PRN
  Administered 2023-03-31 – 2023-04-06 (×2): 25 mg via ORAL
  Filled 2023-03-31 (×2): qty 1

## 2023-03-31 MED ORDER — ORAL CARE MOUTH RINSE: 15.0000 mL | OROMUCOSAL | Status: DC | PRN

## 2023-03-31 MED ORDER — FOLIC ACID 1 MG PO TABS
1.0000 mg | ORAL_TABLET | Freq: Every day | ORAL | Status: DC
  Administered 2023-03-31 – 2023-04-07 (×6): 1 mg via ORAL
  Filled 2023-03-31 (×8): qty 1

## 2023-03-31 MED ORDER — SODIUM CHLORIDE 0.9 % IV BOLUS
1000.0000 mL | Freq: Once | INTRAVENOUS | Status: AC
  Administered 2023-03-31: 1000 mL via INTRAVENOUS

## 2023-03-31 MED ORDER — SODIUM CHLORIDE 0.9 % IV BOLUS
500.0000 mL | Freq: Once | INTRAVENOUS | Status: AC
  Administered 2023-03-31: 500 mL via INTRAVENOUS

## 2023-03-31 MED ORDER — DEXTROSE-SODIUM CHLORIDE 5-0.9 % IV SOLN: INTRAVENOUS | Status: DC

## 2023-03-31 MED ORDER — DEXTROSE 50 % IV SOLN
INTRAVENOUS | Status: AC
  Filled 2023-03-31: qty 50

## 2023-03-31 MED ORDER — DEXTROSE 50 % IV SOLN
25.0000 g | INTRAVENOUS | Status: AC
  Administered 2023-03-31: 25 g via INTRAVENOUS

## 2023-03-31 MED ORDER — CHLORHEXIDINE GLUCONATE CLOTH 2 % EX PADS
6.0000 | MEDICATED_PAD | Freq: Every day | CUTANEOUS | Status: DC
  Administered 2023-03-31 – 2023-04-07 (×6): 6 via TOPICAL

## 2023-03-31 MED ORDER — ENOXAPARIN SODIUM 30 MG/0.3ML IJ SOSY: 30.0000 mg | PREFILLED_SYRINGE | INTRAMUSCULAR | Status: DC

## 2023-03-31 MED ORDER — IBUPROFEN 200 MG PO TABS
400.0000 mg | ORAL_TABLET | Freq: Four times a day (QID) | ORAL | Status: DC | PRN
  Administered 2023-03-31: 400 mg via ORAL
  Filled 2023-03-31: qty 2

## 2023-03-31 MED ORDER — ENOXAPARIN SODIUM 30 MG/0.3ML IJ SOSY
30.0000 mg | PREFILLED_SYRINGE | INTRAMUSCULAR | Status: DC
  Administered 2023-03-31: 30 mg via SUBCUTANEOUS
  Filled 2023-03-31: qty 0.3

## 2023-03-31 MED ORDER — POLYETHYLENE GLYCOL 3350 17 G PO PACK: 17.0000 g | PACK | Freq: Every day | ORAL | Status: AC | PRN

## 2023-03-31 MED ORDER — ONDANSETRON HCL 4 MG PO TABS: 4.0000 mg | ORAL_TABLET | Freq: Four times a day (QID) | ORAL | Status: DC | PRN

## 2023-03-31 MED ORDER — ADULT MULTIVITAMIN W/MINERALS CH
1.0000 | ORAL_TABLET | Freq: Every day | ORAL | Status: DC
  Administered 2023-03-31 – 2023-04-07 (×6): 1 via ORAL
  Filled 2023-03-31 (×8): qty 1

## 2023-03-31 MED ORDER — THIAMINE HCL 100 MG/ML IJ SOLN
100.0000 mg | Freq: Every day | INTRAMUSCULAR | Status: DC
  Administered 2023-03-31 – 2023-04-03 (×4): 100 mg via INTRAVENOUS
  Filled 2023-03-31 (×5): qty 2

## 2023-03-31 NOTE — Procedures (Signed)
PROCEDURE SUMMARY:  Successful ultrasound guided paracentesis from the left lower quadrant.  Yielded 1 liter of yellow fluid.  No immediate complications.  The patient tolerated the procedure well.   Specimen  was sent for labs.  EBL none  If the patient eventually requires >/=2 paracenteses in a 30 day period, screening evaluation by the [facility] will be assessed.   Artemio Aly

## 2023-03-31 NOTE — Progress Notes (Signed)
   03/31/23 1845  Spiritual Encounters  Type of Visit Initial  Care provided to: Pt and family  Conversation partners present during encounter Nurse  Referral source Nurse (RN/NT/LPN);Family  Reason for visit Urgent spiritual support  OnCall Visit No   Chaplain met with patient, Christina Miller and her family. [name] was alert but said she hurts everywhere and she is tired. Family was encouraging her and she appreciated their support. I spoke with Cordelia Pen and asked if she would like to pray She agreed to prayer which we did. There was no energy in this person. She is hoping for some relief.   Valerie Roys Ku [facility]  [phone]

## 2023-03-31 NOTE — Consult Note (Addendum)
Consultation Note   Referring Provider:  [facility] PCP: Christina Matar, MD Primary Gastroenterologist:  Dr. Lavon Miller       Reason for Consultation: elevated LFT's DOA: 04/01/2023         Hospital Day: 2   ASSESSMENT & PLAN   57 y.o. year old female with GERD, hypertension, alcohol use disorder, MS (2003),COPD, diverticulosis complicated with perforated sigmoid diverticulitis with complex abscess (2019). Presents presents to the emergency department stating that she has been experiencing diffuse abdominal pain, nausea and vomiting for a month. GI consulted for elevated LFT's.   Elevated LFT's. 57 year old female with alcohol use disorder with possible acute associated alcohol hepatitis and steatotic liver disease. Query developing cirrhosis? Reports drinking 1/2 pint of Bourbon daily with up to 5 beers several times a week. Last alcohol intake was yesterday at 2pm. No clinic evidence of hepatic encephalography.  Malnourished. Abd U/S shows Portal vein is patent on color Doppler imaging with bidirectional flow. Hepatic steatosis with mildly nodular liver contour which could indicate cirrhosis w/ bidirectional portal vein flow. And ascites. CT Abd/pelvis shows hepatic steatosis.No biliary ductal dilatation. The gallbladder is without stones. Moderate to large ascites. Spleen normal in size.  MDF = 26  (bili 12.2, PT 16.3, Control time 13.3).   Alcohol Ethyl elevated 158. Tylenol <10. Lipase 31. Albumin 2.3 low. Ammonia 44.  AST 311, ALT 109, alk Phosp 335. Direct Bili 7.2. Indirect 5. PT/INR 16.3/1.3 -Hepatitis panel pending -HIV pending -Ordered Drug screen - pending  -Daily INR/ PT -Paracentesis max fluid to remove 2L. Peritoneal fluid labs to include: Albumin, Body fluid, Gram stain, LDH, Body fluid cell, protein, Aerobic/Anaerobic culture, cytology -On CIWA protocol -Pt counseled complete alcohol abstinence and alcohol rehab  strongly encouraged. -Continue to monitor neuro status closely.  Abdominal pain- lower abdominal pain for 1 month. Pt reports history of constipation with intermittent loose stools, probably obstipation. CT shows thickening of the walls of the cecum and ascending colon,possible infectious or inflammatory colitis.Pending paracentesis with peritoneal fluid labs to r/o SBP.On rocephin and flagyl IV.  -pain management per hospitalist. -Miralax prn  Leukocytosis. WBC 21.7 .Lactic acid >9>8.6. Afebrile. Blood cultures pending. Urine cultures pending. On rocephin and flagyl IV. Paracentesis with cytology to r/o SBP. -ordered chest xray  Macrocytosis preceding anemia. Hgb 8.5. MCV 112.8 (macrocytosis). Negative fecal occult blood. Normal PLT 272. No known history of anemia or blood transfusions. Pt reported 1 episode of black tarry stool 3 weeks ago. No overt bleeding at this time. -pending Iron panel, Folate, B12 labs, replete per protocol  Hyponatremia 118>121. Creat 0.80 BUN 27 -continue to monitor  GERD. Patient reports history of GERD and takes OTC Pepcid prn. No EGD. CT abd/pelvis shows Mucosal enhancement with thickening of the walls of the distal esophagus and gastric folds suggesting gastritis/esophagitis. Patient reports GERD symptoms.  -Continue Pantoprazole 40mg  IV -Consider endoscopic evaluation during this hospitalization.   COPD-home albuterol  Hx of tubular adenoma colonic polyps- due for colonoscopy by 1 year  Additional co-morbidities include : hypertension- on amlodipine, MS- not on treatment  HISTORY OF PRESENT ILLNESS   Patient lying in bed. A&Ox3. Patient reports she came into ER for worsening generalized abdominal pain, nausea, and altered bowels  over the past month. She reports her bowels are mixed with constipation BSS score 2 and loose stools. She reports 1 single episode of dark tarry stool about 3 weeks ago. Last BM was 4 days ago. She reports dark orange urine. No  Tylenol use. NSAID use prn. No history of liver disease. No history of previous hospitalization for alcohol related illness. Reports she drinks 1/2 pint of Bourbon daily and few times a month will also add up to 5 beers. Last time she had alcohol was at 2 pm yesterday two small airplane bottles of liquor. She smokes 1 pack daily for over 50 years. Hx of COPD but does not require oxygen therapy. Family history includes lung CA in her mother. She has hx of GERD and states she takes OTC Pepcid as needed. She has never had EGD. Denies hematemesis. Admits to poor appetite and nausea. She reports she has been very weak the last three weeks and has not been ambulating much. Hx of MS but reports she is not on any current treatment. Last colonoscopy 05/2018 with Dr. Lavon Miller which showed   Previous GI Evaluations   CT ABDOMEN AND PELVIS WITH CONTRAST   IMPRESSION: 1. Thickening of the walls of the cecum and ascending colon, possible infectious or inflammatory colitis. 2. Mucosal enhancement with thickening of the walls of the distal esophagus and gastric folds suggesting gastritis/esophagitis. 3. Inferior displacement of the rectum below the pubococcygeal line suggesting pelvic floor dysfunction. There is mild perirectal fat stranding extending into the soft tissues in the perineum, greater on the right than on the left. No abscess is seen. 4. Moderate-to-large ascites and anasarca. 5. Hepatic steatosis. 6. Aortic atherosclerosis. 7. Remaining incidental findings as described above.   ULTRASOUND ABDOMEN LIMITED RIGHT UPPER QUADRANT   IMPRESSION: 1. Hepatic steatosis with mildly nodular liver contour which could indicate cirrhosis with bidirectional portal vein flow. 2. Gallbladder sludge and mild gallbladder wall thickening which Christina Miller be related to liver disease. No gallstones or sonographic Murphy sign to strongly suggest acute cholecystitis. 3. Ascites.  COLONOSCOPY 05/2018  Impression:  -  The examined portion of the ileum was normal. Biopsied.  - Three 5 to 9 mm polyps in the descending colon, in the transverse colon and in the ascending colon, removed with a cold snare. Resected and retrieved.  - Normal mucosa in the entire examined colon. Biopsied.  - Moderate diverticulosis in the sigmoid colon. There was evidence of diverticular spasm. Peri- diverticular erythema was seen. There was evidence of an impacted diverticulum. Purulent discharge was seen in association with the diverticular opening, suspicious of diverticulitis.  - Non- bleeding internal hemorrhoids.  - No endoscopic findings to suggest IBD or Crohn' s disease  Results:Polyps removed were benign but precancerous, tubular adenomas.  Recall colonoscopy in 3 years for surveillance. Small bowel and colon biopsies showed normal mucosa with no findings to suggest underlying IBD. 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  Labs and Imaging: Recent Labs    03/31/23 0019  WBC 21.7*  HGB 8.5*  HCT 25.5*  PLT 272   Recent Labs    03/31/23 0019  NA 118*  K 5.1  CL 78*  CO2 18*  GLUCOSE 90  BUN 27*  CREATININE 0.80  CALCIUM 7.9*   Recent Labs    03/31/23 0019  PROT 6.7  ALBUMIN 2.3*  AST 311*  ALT 109*  ALKPHOS 335*  BILITOT 12.2*  BILIDIR 7.2*  IBILI 5.0*   No results  for input(s): "HEPBSAG", "HCVAB", "HEPAIGM", "HEPBIGM" in the last 72 hours. Recent Labs    03/31/23 0019  LABPROT 16.3*  INR 1.3*      Past Medical History:  Diagnosis Date   Abnormal Pap smear    cryo   Allergy    Anemia    Bone fracture    ankle   Cancer (HCC) 1987   cervical Cancer   Colonic diverticular abscess 02/12/2019   COPD (chronic obstructive pulmonary disease) (HCC)    Emphysema    Emphysema of lung (HCC)    GERD (gastroesophageal reflux disease)    Headache(784.0)    Hypertension    Infection    urinary tract infection   MS (multiple sclerosis) (HCC)     Neuromuscular disorder (HCC)    hands and feet face and lip and muscle weakness due to MS   Pelvic abscess in female    Sepsis due to Escherichia coli (E. coli) (HCC) 03/09/2018    Past Surgical History:  Procedure Laterality Date   ARM WOUND REPAIR / CLOSURE     BREAST BIOPSY Left 07/03/2012   COLPOSCOPY     DILATION AND CURETTAGE OF UTERUS     IR CATHETER TUBE CHANGE  04/04/2018   IR CATHETER TUBE CHANGE  05/16/2018   IR CATHETER TUBE CHANGE  07/05/2018   IR CATHETER TUBE CHANGE  09/21/2018   IR RADIOLOGIST EVAL & MGMT  03/29/2018   IR RADIOLOGIST EVAL & MGMT  04/12/2018   IR RADIOLOGIST EVAL & MGMT  02/20/2019   JP DRAIN TUBE     PROCTOSCOPY N/A 01/30/2019   Procedure: RIGID PROCTOSCOPY;  Surgeon: Karie Soda, MD;  Location: [facility];  Service: General;  Laterality: N/A;   ROBOT ASSISTED LAPAROSCOPIC PARTIAL COLECTOMY  01/30/2019   for diverticulitis.  LSO as well   SALPINGOOPHORECTOMY Left 01/30/2019   LSO at time of sigmoid colectomy   TUBAL LIGATION      Family History  Problem Relation Age of Onset   Diabetes Father    Diabetes Sister    Hypertension Sister    Diabetes Brother    Hypertension Brother    Lung cancer Mother    Colon cancer Neg Hx    Stomach cancer Neg Hx    Pancreatic cancer Neg Hx    Esophageal cancer Neg Hx    Rectal cancer Neg Hx     Prior to Admission medications   Medication Sig Start Date End Date Taking? Authorizing Provider  albuterol (VENTOLIN HFA) 108 (90 Base) MCG/ACT inhaler Inhale 2 puffs into the lungs every 6 (six) hours as needed for wheezing or shortness of breath. Patient not taking: Reported on 03/31/2023 08/22/19   Christina Matar, MD  amLODipine (NORVASC) 10 MG tablet Take 1 tablet (10 mg total) by mouth daily. Patient not taking: Reported on 03/31/2023 04/02/20   Christina Matar, MD  famotidine (PEPCID) 20 MG tablet Take 1 tablet (20 mg total) by mouth 2 (two) times daily as needed for heartburn or indigestion. Patient not taking:  Reported on 03/31/2023 03/04/20   Charlynne Pander, MD  hydrochlorothiazide (HYDRODIURIL) 12.5 MG tablet Take 1 tablet (12.5 mg total) by mouth daily. Patient not taking: Reported on 03/31/2023 09/15/20   Christina Matar, MD  nicotine (NICODERM CQ - DOSED IN MG/24 HOURS) 21 mg/24hr patch Place 1 patch (21 mg total) onto the skin daily. Patient not taking: Reported on 03/31/2023 04/02/20   Christina Matar, MD  nicotine polacrilex (NICORETTE) 2  MG gum Take 1 each (2 mg total) by mouth as needed for smoking cessation. Max 30 pieces/day Patient not taking: Reported on 03/31/2023 05/14/20   Christina Matar, MD    Current Facility-Administered Medications  Medication Dose Route Frequency Provider Last Rate Last Admin   0.9 %  sodium chloride infusion   Intravenous Continuous Kirby Crigler, Mir M, MD       albuterol (PROVENTIL) (2.5 MG/3ML) 0.083% nebulizer solution 2.5 mg  2.5 mg Nebulization Q2H PRN Kirby Crigler, Mir M, MD       cefTRIAXone (ROCEPHIN) 2 g in sodium chloride 0.9 % 100 mL IVPB  2 g Intravenous Q24H Kirby Crigler, Mir M, MD       enoxaparin (LOVENOX) injection 30 mg  30 mg Subcutaneous Q24H Kirby Crigler, Mir M, MD       folic acid (FOLVITE) tablet 1 mg  1 mg Oral Daily Kirby Crigler, Mir M, MD       ibuprofen (ADVIL) tablet 400 mg  400 mg Oral Q6H PRN Kirby Crigler, Mir M, MD       LORazepam (ATIVAN) tablet 1-4 mg  1-4 mg Oral Q1H PRN Kirby Crigler, Mir M, MD       metroNIDAZOLE (FLAGYL) IVPB 500 mg  500 mg Intravenous Q8H Kirby Crigler, Mir M, MD 100 mL/hr at 03/31/23 0716 500 mg at 03/31/23 0716   multivitamin with minerals tablet 1 tablet  1 tablet Oral Daily Kirby Crigler, Mir M, MD       ondansetron [facility]) tablet 4 mg  4 mg Oral Q6H PRN Kirby Crigler, Mir M, MD       Or   ondansetron [facility]) injection 4 mg  4 mg Intravenous Q6H PRN Kirby Crigler, Mir M, MD       oxyCODONE (Oxy IR/ROXICODONE) immediate release tablet 5 mg  5 mg Oral Q4H PRN Kirby Crigler, Mir M, MD       pantoprazole (PROTONIX) injection 40  mg  40 mg Intravenous Q24H Kirby Crigler, Mir M, MD   40 mg at 03/31/23 0865   thiamine (VITAMIN B1) tablet 100 mg  100 mg Oral Daily Kirby Crigler, Mir M, MD       Or   thiamine (VITAMIN B1) injection 100 mg  100 mg Intravenous Daily Kirby Crigler, Mir M, MD       traZODone (DESYREL) tablet 25 mg  25 mg Oral QHS PRN Kirby Crigler, Mir M, MD       Current Outpatient Medications  Medication Sig Dispense Refill   albuterol (VENTOLIN HFA) 108 (90 Base) MCG/ACT inhaler Inhale 2 puffs into the lungs every 6 (six) hours as needed for wheezing or shortness of breath. (Patient not taking: Reported on 03/31/2023) 8 g 3   amLODipine (NORVASC) 10 MG tablet Take 1 tablet (10 mg total) by mouth daily. (Patient not taking: Reported on 03/31/2023) 30 tablet 4   famotidine (PEPCID) 20 MG tablet Take 1 tablet (20 mg total) by mouth 2 (two) times daily as needed for heartburn or indigestion. (Patient not taking: Reported on 03/31/2023) 30 tablet 0   hydrochlorothiazide (HYDRODIURIL) 12.5 MG tablet Take 1 tablet (12.5 mg total) by mouth daily. (Patient not taking: Reported on 03/31/2023) 30 tablet 3   nicotine (NICODERM CQ - DOSED IN MG/24 HOURS) 21 mg/24hr patch Place 1 patch (21 mg total) onto the skin daily. (Patient not taking: Reported on 03/31/2023) 28 patch 1   nicotine polacrilex (NICORETTE) 2 MG gum Take 1 each (2 mg total) by mouth as needed for smoking cessation. Max 30 pieces/day (Patient not taking: Reported on  03/31/2023) 100 tablet 0    Allergies as of 2023/04/20 - Review Complete 09/15/2020  Allergen Reaction Noted   Hydrocodone Nausea And Vomiting 01/22/2019   Bactrim [sulfamethoxazole-trimethoprim] Rash 03/05/2019    Social History   Socioeconomic History   Marital status: Legally Separated    Spouse name: Not on file   Number of children: 2   Years of education: 9   Highest education level: Not on file  Occupational History   Occupation: unemployed  Tobacco Use   Smoking status: Every Day    Current  packs/day: 1.00    Average packs/day: 1 pack/day for 15.0 years (15.0 ttl pk-yrs)    Types: Cigarettes   Smokeless tobacco: Former  Building services engineer status: Never Used  Substance and Sexual Activity   Alcohol use: Yes   Drug use: No   Sexual activity: Yes    Birth control/protection: Surgical  Other Topics Concern   Not on file  Social History Narrative    Right handed    Caffeine use: coffee every morning (1 cup)   P[employer]   Lives with boyfriend, Marcelline Deist   Social Determinants of Health   Financial Resource Strain: Not on file  Food Insecurity: Not on file  Transportation Needs: Not on file  Physical Activity: Not on file  Stress: Not on file  Social Connections: Not on file  Intimate Partner Violence: Not on file     Code Status   Code Status: Full Code  Review of Systems: All systems reviewed and negative except where noted in HPI.  Physical Exam: Vital signs in last 24 hours: Temp:  [97.5 F (36.4 C)-97.6 F (36.4 C)] 97.6 F (36.4 C) (09/13 0659) Pulse Rate:  [113-118] 114 (09/13 0645) Resp:  [10-21] 10 (09/13 0645) BP: (118-145)/(87-96) 118/87 (09/13 0645) SpO2:  [99 %-100 %] 99 % (09/13 0645) Weight:  [31 kg] 31 kg (09/13 0009)    General:  Malnourished pleasant female in NAD Psych:  Cooperative. Normal mood and flat affect Eyes: Pupils equal, sclera icterus  Ears:  Normal auditory acuity Nose: No deformity, discharge or lesions Lungs:  Diminished lung sounds Heart:  tachycardic, no murmurs Abdomen:  distended, generalized abd tenderness without rebound or guarding, hypoactive bowel sounds, no masses felt, ascites present with positive fluid wave,abdomen is not tense, no hepatosplenomegaly  Rectal :  Deferred Msk: Symmetrical without gross deformities.  Neurologic:  Alert, oriented, hands slightly tremors, without overt asterixis  Extremities : 2+ bilateral edema Skin:  Intact without significant lesions, jaundiced. Bruising noted on bilateral  arms   Intake/Output from previous day: No intake/output data recorded. Intake/Output this shift:  No intake/output data recorded.  Principal Problem:   Subacute liver failure    Christina Selbe, NP-C @  03/31/2023, 8:27 AM

## 2023-03-31 NOTE — ED Triage Notes (Signed)
Pt BIBA from home w/lower mid abdominal pain x1 month that has increasingly gotten worse over the last few days. Endorses n/v and constipation x4days

## 2023-03-31 NOTE — ED Notes (Signed)
ED TO INPATIENT HANDOFF REPORT  ED Nurse Name and Phone #: Thamas Jaegers Name/Age/Gender Christina Miller 57 y.o. female Room/Bed: WA20/WA20  Code Status   Code Status: Full Code  Home/SNF/Other Home Patient oriented to: self, place, time, and situation Is this baseline? Yes   Triage Complete: Triage complete  Chief Complaint Subacute liver failure [K72.00]  Triage Note Pt BIBA from home w/lower mid abdominal pain x1 month that has increasingly gotten worse over the last few days. Endorses n/v and constipation x4days   Allergies Allergies  Allergen Reactions   Hydrocodone Nausea And Vomiting   Bactrim [Sulfamethoxazole-Trimethoprim] Rash    Level of Care/Admitting Diagnosis ED Disposition     ED Disposition  Admit   Condition  --   Comment  Hospital Area: [facility] [[id number]]  Level of Care: Stepdown [14]  Admit to SDU based on following criteria: Severe physiological/psychological symptoms:  Any diagnosis requiring assessment & intervention at least every 4 hours on an ongoing basis to obtain desired patient outcomes including stability and rehabilitation  May admit patient to [facility] or [facility] if equivalent level of care is available:: Yes  Covid Evaluation: Asymptomatic - no recent exposure (last 10 days) testing not required  Diagnosis: Subacute liver failure [[id number]]  Admitting Physician: Maryln Gottron [[id number]]  Attending Physician: [facility], MIR [email or web address] [[id number]]  Certification:: I certify this patient will need inpatient services for at least 2 midnights  Expected Medical Readiness: 04/04/2023          B Medical/Surgery History Past Medical History:  Diagnosis Date   Abnormal Pap smear    cryo   Allergy    Anemia    Bone fracture    ankle   Cancer (HCC) 1987   cervical Cancer   Colonic diverticular abscess 02/12/2019   COPD (chronic obstructive pulmonary disease) (HCC)    Emphysema    Emphysema of lung (HCC)     GERD (gastroesophageal reflux disease)    Headache(784.0)    Hypertension    Infection    urinary tract infection   MS (multiple sclerosis) (HCC)    Neuromuscular disorder (HCC)    hands and feet face and lip and muscle weakness due to MS   Pelvic abscess in female    Sepsis due to Escherichia coli (E. coli) (HCC) 03/09/2018   Past Surgical History:  Procedure Laterality Date   ARM WOUND REPAIR / CLOSURE     BREAST BIOPSY Left 07/03/2012   COLPOSCOPY     DILATION AND CURETTAGE OF UTERUS     IR CATHETER TUBE CHANGE  04/04/2018   IR CATHETER TUBE CHANGE  05/16/2018   IR CATHETER TUBE CHANGE  07/05/2018   IR CATHETER TUBE CHANGE  09/21/2018   IR RADIOLOGIST EVAL & MGMT  03/29/2018   IR RADIOLOGIST EVAL & MGMT  04/12/2018   IR RADIOLOGIST EVAL & MGMT  02/20/2019   JP DRAIN TUBE     PROCTOSCOPY N/A 01/30/2019   Procedure: RIGID PROCTOSCOPY;  Surgeon: Karie Soda, MD;  Location: [facility];  Service: General;  Laterality: N/A;   ROBOT ASSISTED LAPAROSCOPIC PARTIAL COLECTOMY  01/30/2019   for diverticulitis.  LSO as well   SALPINGOOPHORECTOMY Left 01/30/2019   LSO at time of sigmoid colectomy   TUBAL LIGATION       A IV Location/Drains/Wounds Patient Lines/Drains/Airways Status     Active Line/Drains/Airways     Name Placement date Placement time Site Days   Peripheral IV 03/31/23 20  G 1" Right Antecubital 03/31/23  0135  Antecubital  less than 1   Peripheral IV 03/31/23 20 G Left Antecubital 03/31/23  0538  Antecubital  less than 1   Closed System Drain 1 Left Abdomen Bulb (JP) 12 Fr. 02/13/19  1528  Abdomen  1507   Incision (Closed) 01/30/19 Abdomen Other (Comment) 01/30/19  1518  -- 1521   Incision - 4 Ports Abdomen Right;Medial;Lower Right;Medial;Mid Right;Mid;Upper Mid;Upper 01/30/19  1255  -- 1521            Intake/Output Last 24 hours No intake or output data in the 24 hours ending 03/31/23 1259  Labs/Imaging Results for orders placed or performed during the hospital  encounter of 03/28/2023 (from the past 48 hour(s))  CBC with Differential/Platelet     Status: Abnormal   Collection Time: 03/31/23 12:19 AM  Result Value Ref Range   WBC 21.7 (H) 4.0 - 10.5 K/uL   RBC 2.26 (L) 3.87 - 5.11 MIL/uL   Hemoglobin 8.5 (L) 12.0 - 15.0 g/dL   HCT 09.3 (L) 23.5 - 57.3 %   MCV 112.8 (H) 80.0 - 100.0 fL   MCH 37.6 (H) 26.0 - 34.0 pg   MCHC 33.3 30.0 - 36.0 g/dL   RDW 22.0 (H) 25.4 - 27.0 %   Platelets 272 150 - 400 K/uL   nRBC 0.0 0.0 - 0.2 %   Neutrophils Relative % 85 %   Neutro Abs 18.3 (H) 1.7 - 7.7 K/uL   Lymphocytes Relative 9 %   Lymphs Abs 2.0 0.7 - 4.0 K/uL   Monocytes Relative 5 %   Monocytes Absolute 1.1 (H) 0.1 - 1.0 K/uL   Eosinophils Relative 0 %   Eosinophils Absolute 0.0 0.0 - 0.5 K/uL   Basophils Relative 0 %   Basophils Absolute 0.0 0.0 - 0.1 K/uL   Immature Granulocytes 1 %   Abs Immature Granulocytes 0.26 (H) 0.00 - 0.07 K/uL   Polychromasia PRESENT    Target Cells PRESENT     Comment: Performed at [facility], [address], [place], [place] [address]  Basic metabolic panel     Status: Abnormal   Collection Time: 03/31/23 12:19 AM  Result Value Ref Range   Sodium 118 (LL) 135 - 145 mmol/L    Comment: CRITICAL RESULT CALLED TO, READ BACK BY AND VERIFIED WITH METCALF,A RN @ 0234 03/31/23 BY CHILDRESS,E   Potassium 5.1 3.5 - 5.1 mmol/L   Chloride 78 (L) 98 - 111 mmol/L   CO2 18 (L) 22 - 32 mmol/L   Glucose, Bld 90 70 - 99 mg/dL    Comment: Glucose reference range applies only to samples taken after fasting for at least 8 hours.   BUN 27 (H) 6 - 20 mg/dL   Creatinine, Ser 2.83 0.44 - 1.00 mg/dL   Calcium 7.9 (L) 8.9 - 10.3 mg/dL   GFR, Estimated >15 >17 mL/min    Comment: (NOTE) Calculated using the CKD-EPI Creatinine Equation (2021)    Anion gap 22 (H) 5 - 15    Comment: ELECTROLYTES REPEATED TO VERIFY Performed at [facility], [address], [place], [place] [address]   Lipase, blood      Status: None   Collection Time: 03/31/23 12:19 AM  Result Value Ref Range   Lipase 31 11 - 51 U/L    Comment: Performed at [facility], [address] [place], [place] [address]  Hepatic function panel     Status:  Abnormal   Collection Time: 03/31/23 12:19 AM  Result Value Ref Range   Total Protein 6.7 6.5 - 8.1 g/dL   Albumin 2.3 (L) 3.5 - 5.0 g/dL   AST 332 (H) 15 - 41 U/L   ALT 109 (H) 0 - 44 U/L   Alkaline Phosphatase 335 (H) 38 - 126 U/L   Total Bilirubin 12.2 (H) 0.3 - 1.2 mg/dL   Bilirubin, Direct 7.2 (H) 0.0 - 0.2 mg/dL   Indirect Bilirubin 5.0 (H) 0.3 - 0.9 mg/dL    Comment: Performed at [facility], [address], [place], [place] [address]  Troponin I (High Sensitivity)     Status: None   Collection Time: 03/31/23 12:19 AM  Result Value Ref Range   Troponin I (High Sensitivity) 17 <18 ng/L    Comment: (NOTE) Elevated high sensitivity troponin I (hsTnI) values and significant  changes across serial measurements may suggest ACS but many other  chronic and acute conditions are known to elevate hsTnI results.  Refer to the "Links" section for chest pain algorithms and additional  guidance. Performed at [facility], [address] [place], [place] [address]   Ammonia     Status: Abnormal   Collection Time: 03/31/23 12:19 AM  Result Value Ref Range   Ammonia 44 (H) 9 - 35 umol/L    Comment: Performed at [facility], [address] [place], [place] [address]  Ethanol     Status: Abnormal   Collection Time: 03/31/23 12:19 AM  Result Value Ref Range   Alcohol, Ethyl (B) 158 (H) <10 mg/dL    Comment: (NOTE) Lowest detectable limit for serum alcohol is 10 mg/dL.  For medical purposes only. Performed at [facility], [address] [place], [place] [address]   Protime-INR     Status: Abnormal   Collection Time: 03/31/23 12:19 AM  Result Value Ref Range    Prothrombin Time 16.3 (H) 11.4 - 15.2 seconds   INR 1.3 (H) 0.8 - 1.2    Comment: (NOTE) INR goal varies based on device and disease states. Performed at [facility], [address], [place], [place] [address]   Acetaminophen level     Status: Abnormal   Collection Time: 03/31/23 12:19 AM  Result Value Ref Range   Acetaminophen (Tylenol), Serum <10 (L) 10 - 30 ug/mL    Comment: (NOTE) Therapeutic concentrations vary significantly. A range of 10-30 ug/mL  may be an effective concentration for many patients. However, some  are best treated at concentrations outside of this range. Acetaminophen concentrations >150 ug/mL at 4 hours after ingestion  and >50 ug/mL at 12 hours after ingestion are often associated with  toxic reactions.  Performed at [facility], [address] [place], [place] [address]   Lactic acid, plasma     Status: Abnormal   Collection Time: 03/31/23 12:19 AM  Result Value Ref Range   Lactic Acid, Venous >9.0 (HH) 0.5 - 1.9 mmol/L    Comment: CRITICAL RESULT CALLED TO, READ BACK BY AND VERIFIED WITH LIVINGSTON,K EMT @ 0220 03/31/23 BY CHILDRESS,E Performed at [facility], [address], [place], [place] [address]   Reticulocytes     Status: Abnormal   Collection Time: 03/31/23 12:19 AM  Result Value Ref Range   Retic Ct Pct 8.1 (H) 0.4 - 3.1 %   RBC. 2.25 (L) 3.87 - 5.11 MIL/uL   Retic Count, Absolute 182.9 19.0 -  186.0 K/uL   Immature Retic Fract 38.4 (H) 2.3 - 15.9 %    Comment: Performed at [facility], [address] [place], [place] [address]  Lactic acid, plasma     Status: Abnormal   Collection Time: 03/31/23  3:11 AM  Result Value Ref Range   Lactic Acid, Venous 8.6 (HH) 0.5 - 1.9 mmol/L    Comment: CRITICAL VALUE NOTED. VALUE IS CONSISTENT WITH PREVIOUSLY REPORTED/CALLED VALUE Performed at [facility], [address], [place], [place] [address]    POC occult blood, ED Provider will collect     Status: None   Collection Time: 03/31/23  4:54 AM  Result Value Ref Range   Fecal Occult Bld NEGATIVE NEGATIVE  Culture, blood (Routine X 2) w Reflex to ID Panel     Status: None (Preliminary result)   Collection Time: 03/31/23  5:36 AM   Specimen: BLOOD  Result Value Ref Range   Specimen Description      BLOOD LEFT ANTECUBITAL Performed at [facility], [address] [place], [place] [address]    Special Requests      BOTTLES DRAWN AEROBIC AND ANAEROBIC Blood Culture results may not be optimal due to an excessive volume of blood received in culture bottles Performed at [facility], [address] [place], [place] [address]    Culture      NO GROWTH < 12 HOURS Performed at [facility], [address], [place], [place] [address]    Report Status PENDING   Urinalysis, w/ Reflex to Culture (Infection Suspected) -Urine, Clean Catch     Status: Abnormal   Collection Time: 03/31/23  6:36 AM  Result Value Ref Range   Specimen Source URINE, CLEAN CATCH    Color, Urine AMBER (A) YELLOW    Comment: BIOCHEMICALS MAY BE AFFECTED BY COLOR   APPearance CLOUDY (A) CLEAR   Specific Gravity, Urine 1.032 (H) 1.005 - 1.030   pH 5.0 5.0 - 8.0   Glucose, UA NEGATIVE NEGATIVE mg/dL   Hgb urine dipstick SMALL (A) NEGATIVE   Bilirubin Urine MODERATE (A) NEGATIVE   Ketones, ur NEGATIVE NEGATIVE mg/dL   Protein, ur 30 (A) NEGATIVE mg/dL   Nitrite NEGATIVE NEGATIVE   Leukocytes,Ua MODERATE (A) NEGATIVE   RBC / HPF 0-5 0 - 5 RBC/hpf   WBC, UA >50 0 - 5 WBC/hpf    Comment:        Reflex urine culture not performed if WBC <=10, OR if Squamous epithelial cells >5. If Squamous epithelial cells >5 suggest recollection.    Bacteria, UA MANY (A) NONE SEEN   Squamous Epithelial / HPF 0-5 0 - 5 /HPF   WBC Clumps PRESENT    Mucus PRESENT    Hyaline Casts, UA PRESENT    Crystals BILIRUBIN CRYSTALS PRESENT (A)  NEGATIVE    Comment: Performed at [facility], [address], [place], [place] [address]  Rapid urine drug screen (hospital performed)     Status: Abnormal   Collection Time: 03/31/23  6:36 AM  Result Value Ref Range   Opiates POSITIVE (A) NONE DETECTED   Cocaine NONE DETECTED NONE DETECTED   Benzodiazepines NONE DETECTED NONE DETECTED   Amphetamines NONE DETECTED NONE DETECTED   Tetrahydrocannabinol NONE DETECTED NONE DETECTED   Barbiturates NONE DETECTED NONE DETECTED    Comment: (NOTE) DRUG SCREEN FOR MEDICAL PURPOSES ONLY.  IF CONFIRMATION IS NEEDED FOR ANY PURPOSE, NOTIFY LAB WITHIN 5  DAYS.  LOWEST DETECTABLE LIMITS FOR URINE DRUG SCREEN Drug Class                     Cutoff (ng/mL) Amphetamine and metabolites    1000 Barbiturate and metabolites    200 Benzodiazepine                 200 Opiates and metabolites        300 Cocaine and metabolites        300 THC                            50 Performed at [facility], [address] [place], [place] [address]   Basic metabolic panel     Status: Abnormal   Collection Time: 03/31/23  8:00 AM  Result Value Ref Range   Sodium 121 (L) 135 - 145 mmol/L   Potassium 4.3 3.5 - 5.1 mmol/L   Chloride 83 (L) 98 - 111 mmol/L   CO2 20 (L) 22 - 32 mmol/L   Glucose, Bld 71 70 - 99 mg/dL    Comment: Glucose reference range applies only to samples taken after fasting for at least 8 hours.   BUN 25 (H) 6 - 20 mg/dL   Creatinine, Ser 6.21 0.44 - 1.00 mg/dL   Calcium 7.3 (L) 8.9 - 10.3 mg/dL   GFR, Estimated >30 >86 mL/min    Comment: (NOTE) Calculated using the CKD-EPI Creatinine Equation (2021)    Anion gap 18 (H) 5 - 15    Comment: Performed at [facility], [address], [place], [place] [address]  Hepatitis panel, acute     Status: None   Collection Time: 03/31/23  8:00 AM  Result Value Ref Range   Hepatitis B Surface Ag NON REACTIVE NON REACTIVE   HCV Ab NON REACTIVE  NON REACTIVE    Comment: (NOTE) Nonreactive HCV antibody screen is consistent with no HCV infections,  unless recent infection is suspected or other evidence exists to indicate HCV infection.     Hep A IgM NON REACTIVE NON REACTIVE   Hep B C IgM NON REACTIVE NON REACTIVE    Comment: Performed at [facility], [address], [place], [place] [address]  HIV Antibody (routine testing w rflx)     Status: None   Collection Time: 03/31/23  8:00 AM  Result Value Ref Range   HIV Screen 4th Generation wRfx Non Reactive Non Reactive    Comment: Performed at [facility], [address], [place], [place] [address]  Vitamin B12     Status: None   Collection Time: 03/31/23  8:00 AM  Result Value Ref Range   Vitamin B-12 868 180 - 914 pg/mL    Comment: (NOTE) This assay is not validated for testing neonatal or myeloproliferative syndrome specimens for Vitamin B12 levels. Performed at [facility], [address] [place], [place] [address]   Folate     Status: Abnormal   Collection Time: 03/31/23  8:00 AM  Result Value Ref Range   Folate 2.8 (L) >5.9 ng/mL    Comment: Performed at [facility], [address], [place], [place] [address]  Iron and TIBC     Status: None   Collection Time: 03/31/23  8:00 AM  Result Value Ref Range   Iron 90 28 - 170 ug/dL   TIBC NOT CALCULATED 366 -  450 ug/dL   Saturation Ratios NOT CALCULATED 10.4 - 31.8 %   UIBC NOT CALCULATED ug/dL    Comment: Performed at [facility] [address] [place], [place] [address]  Ferritin     Status: Abnormal   Collection Time: 03/31/23  8:00 AM  Result Value Ref Range   Ferritin 3,140 (H) 11 - 307 ng/mL    Comment: Performed at [facility], [address] [place], [place] [address]  Osmolality     Status: None   Collection Time: 03/31/23  8:00 AM  Result Value Ref Range   Osmolality 291 275 - 295 mOsm/kg    Comment: Performed  at [facility], [address], [place], [place] [address]   US Abdomen Limited RUQ (LIVER/GB)  Result Date: 03/31/2023 CLINICAL DATA:  Elevated liver function tests. EXAM: ULTRASOUND ABDOMEN LIMITED RIGHT UPPER QUADRANT COMPARISON:  CT abdomen and pelvis 03/31/2023 FINDINGS: The examination was technically limited due to increased respiratory rate and limited patient mobility. Gallbladder: Gallbladder sludge. No shadowing gallstones. Mild gallbladder wall thickening measuring 4 mm. Pericholecystic fluid in the setting of ascites. No sonographic Murphy sign noted by sonographer. Common bile duct: Diameter: 4 mm Liver: Diffusely echogenic and coarsened liver parenchyma without a mass identified. Mildly nodular liver contour. Portal vein is patent on color Doppler imaging with bidirectional flow. Other: Ascites. IMPRESSION: 1. Hepatic steatosis with mildly nodular liver contour which could indicate cirrhosis with bidirectional portal vein flow. 2. Gallbladder sludge and mild gallbladder wall thickening which may be related to liver disease. No gallstones or sonographic Murphy sign to strongly suggest acute cholecystitis. 3. Ascites. Electronically Signed   By: Sebastian Ache M.D.   On: 03/31/2023 08:35   CT ABDOMEN PELVIS W CONTRAST  Result Date: 03/31/2023 CLINICAL DATA:  Mid to lower abdominal pain for 1 month, worsening in the last few days. Nausea, vomiting and constipation. EXAM: CT ABDOMEN AND PELVIS WITH CONTRAST TECHNIQUE: Multidetector CT imaging of the abdomen and pelvis was performed using the standard protocol following bolus administration of intravenous contrast. RADIATION DOSE REDUCTION: This exam was performed according to the departmental dose-optimization program which includes automated exposure control, adjustment of the mA and/or kV according to patient size and/or use of iterative reconstruction technique. CONTRAST:  75mL OMNIPAQUE IOHEXOL 300 MG/ML  SOLN COMPARISON:  02/26/2019,  03/05/2019. FINDINGS: Lower chest: There is a small left pleural effusion. Bronchial wall thickening is noted bilaterally with atelectasis or scarring at the lung bases. There is a stable 4 mm nodule in the right middle lobe, unchanged from 2020 and likely benign. Hepatobiliary: Hepatic steatosis is noted with focal fatty sparing at the gallbladder fossa and left lobe of the liver. No biliary ductal dilatation. The gallbladder is without stones. Pancreas: Unremarkable. No pancreatic ductal dilatation or surrounding inflammatory changes. Spleen: Normal in size without focal abnormality. Adrenals/Urinary Tract: The adrenal glands are within normal limits. The kidneys enhance symmetrically. No renal calculus or hydronephrosis. The bladder is unremarkable. A stable cystic structure is noted inferior to the urinary bladder, possible urethral diverticulum versus nabothian cyst Stomach/Bowel: There is thickening of the walls of the distal esophagus and stomach with mucosal enhancement. No bowel obstruction, free air, or pneumatosis. No free air or pneumatosis is seen. There is inferior subluxation of the rectum below the pubococcygeal line compatible with pelvic floor dysfunction. Increased density is noted in the perirectal space bilaterally, greater on the right than on the left. No abscess is seen. There is  evidence of partial colectomy with anastomotic site at the rectosigmoid colon. The appendix is not seen. There is rectal wall thickening involving the ascending colon and cecum. Vascular/Lymphatic: Aortic atherosclerosis. No enlarged abdominal or pelvic lymph nodes. Reproductive: Uterus and bilateral adnexa are unremarkable. Other: There is moderate-to-large ascites.  Anasarca is noted. Musculoskeletal: Degenerative changes are noted in the thoracolumbar spine. No acute osseous abnormality. IMPRESSION: 1. Thickening of the walls of the cecum and ascending colon, possible infectious or inflammatory colitis. 2. Mucosal  enhancement with thickening of the walls of the distal esophagus and gastric folds suggesting gastritis/esophagitis. 3. Inferior displacement of the rectum below the pubococcygeal line suggesting pelvic floor dysfunction. There is mild perirectal fat stranding extending into the soft tissues in the perineum, greater on the right than on the left. No abscess is seen. 4. Moderate-to-large ascites and anasarca. 5. Hepatic steatosis. 6. Aortic atherosclerosis. 7. Remaining incidental findings as described above. Electronically Signed   By: Thornell Sartorius M.D.   On: 03/31/2023 04:27    Pending Labs Unresulted Labs (From admission, onward)     Start     Ordered   04/01/23 0500  CBC  Tomorrow morning,   R        03/31/23 0730   04/01/23 0500  Comprehensive metabolic panel  Tomorrow morning,   R        03/31/23 0731   03/31/23 1200  Basic metabolic panel  Now then every 4 hours,   R      03/31/23 0727   03/31/23 0759  Osmolality, urine  Once,   R        03/31/23 0758   03/31/23 0636  Urine Culture  Once,   R        03/31/23 0636   03/31/23 0448  Culture, blood (Routine X 2) w Reflex to ID Panel  BLOOD CULTURE X 2,   R      03/31/23 0448            Vitals/Pain Today's Vitals   03/31/23 0915 03/31/23 0930 03/31/23 1115 03/31/23 1148  BP:   114/86   Pulse: (!) 117 (!) 121 (!) 115   Resp: (!) 22 11 12    Temp:    97.6 F (36.4 C)  TempSrc:    Oral  SpO2: 95% 90% 96%   Weight:      Height:      PainSc:        Isolation Precautions No active isolations  Medications Medications  metroNIDAZOLE (FLAGYL) IVPB 500 mg (500 mg Intravenous New Bag/Given 03/31/23 1230)  pantoprazole (PROTONIX) injection 40 mg (40 mg Intravenous Given 03/31/23 0819)  0.9 %  sodium chloride infusion ( Intravenous New Bag/Given 03/31/23 0848)  ibuprofen (ADVIL) tablet 400 mg (has no administration in time range)  oxyCODONE (Oxy IR/ROXICODONE) immediate release tablet 5 mg (has no administration in time range)   traZODone (DESYREL) tablet 25 mg (has no administration in time range)  ondansetron (ZOFRAN) tablet 4 mg (has no administration in time range)    Or  ondansetron (ZOFRAN) injection 4 mg (has no administration in time range)  albuterol (PROVENTIL) (2.5 MG/3ML) 0.083% nebulizer solution 2.5 mg (has no administration in time range)  LORazepam (ATIVAN) tablet 1-4 mg (has no administration in time range)  thiamine (VITAMIN B1) tablet 100 mg ( Oral See Alternative 03/31/23 1014)    Or  thiamine (VITAMIN B1) injection 100 mg (100 mg Intravenous Given 03/31/23 1014)  folic acid (FOLVITE) tablet 1 mg (  1 mg Oral Given 03/31/23 1014)  multivitamin with minerals tablet 1 tablet (1 tablet Oral Given 03/31/23 1015)  enoxaparin (LOVENOX) injection 30 mg (has no administration in time range)  cefTRIAXone (ROCEPHIN) 2 g in sodium chloride 0.9 % 100 mL IVPB (has no administration in time range)  polyethylene glycol (MIRALAX / GLYCOLAX) packet 17 g (has no administration in time range)  sodium chloride 0.9 % bolus 500 mL (500 mLs Intravenous Bolus 03/31/23 0141)  HYDROmorphone (DILAUDID) injection 1 mg (1 mg Intravenous Given 03/31/23 0142)  ondansetron (ZOFRAN) injection 4 mg (4 mg Intravenous Given 03/31/23 0141)  sodium chloride 0.9 % bolus 1,000 mL (1,000 mLs Intravenous Bolus 03/31/23 0320)  iohexol (OMNIPAQUE) 300 MG/ML solution 100 mL (75 mLs Intravenous Contrast Given 03/31/23 0327)  cefTRIAXone (ROCEPHIN) 1 g in sodium chloride 0.9 % 100 mL IVPB (0 g Intravenous Stopped 03/31/23 0708)    Mobility non-ambulatory     Focused Assessments     R Recommendations: See Admitting Provider Note  Report given to:   Additional Notes:

## 2023-03-31 NOTE — ED Provider Notes (Signed)
[place] EMERGENCY DEPARTMENT AT [facility] Provider Note   CSN: [id number] Arrival date & time: 29-Apr-2023  2354     History  Chief Complaint  Patient presents with   Abdominal Pain    Christina Miller is a 57 y.o. female.  Patient presents to the emergency department stating that she has been experiencing diffuse abdominal pain, nausea and vomiting for a month.  Symptoms worse over the last few days where she feels constipated.  She thinks she has had blood in her urine but no hematemesis, rectal bleeding or melena.       Home Medications Prior to Admission medications   Medication Sig Start Date End Date Taking? Authorizing Provider  albuterol (VENTOLIN HFA) 108 (90 Base) MCG/ACT inhaler Inhale 2 puffs into the lungs every 6 (six) hours as needed for wheezing or shortness of breath. Patient not taking: Reported on 03/31/2023 08/22/19   Marcine Matar, MD  amLODipine (NORVASC) 10 MG tablet Take 1 tablet (10 mg total) by mouth daily. Patient not taking: Reported on 03/31/2023 04/02/20   Marcine Matar, MD  famotidine (PEPCID) 20 MG tablet Take 1 tablet (20 mg total) by mouth 2 (two) times daily as needed for heartburn or indigestion. Patient not taking: Reported on 03/31/2023 03/04/20   Charlynne Pander, MD  hydrochlorothiazide (HYDRODIURIL) 12.5 MG tablet Take 1 tablet (12.5 mg total) by mouth daily. Patient not taking: Reported on 03/31/2023 09/15/20   Marcine Matar, MD  nicotine (NICODERM CQ - DOSED IN MG/24 HOURS) 21 mg/24hr patch Place 1 patch (21 mg total) onto the skin daily. Patient not taking: Reported on 03/31/2023 04/02/20   Marcine Matar, MD  nicotine polacrilex (NICORETTE) 2 MG gum Take 1 each (2 mg total) by mouth as needed for smoking cessation. Max 30 pieces/day Patient not taking: Reported on 03/31/2023 05/14/20   Marcine Matar, MD      Allergies    Hydrocodone and Bactrim [sulfamethoxazole-trimethoprim]    Review of Systems    Review of Systems  Physical Exam Updated Vital Signs BP (!) 145/96   Pulse (!) 113   Temp (!) 97.5 F (36.4 C) (Oral)   Resp (!) 21   Ht 5\' 1"  (1.549 m)   Wt 31 kg   LMP 06/20/2012   SpO2 100%   BMI 12.91 kg/m  Physical Exam Vitals and nursing note reviewed.  Constitutional:      Appearance: She is underweight. She is ill-appearing.  HENT:     Head: Normocephalic and atraumatic.     Mouth/Throat:     Mouth: Mucous membranes are moist.  Eyes:     General: Vision grossly intact. Gaze aligned appropriately. Scleral icterus present.     Extraocular Movements: Extraocular movements intact.     Conjunctiva/sclera: Conjunctivae normal.  Cardiovascular:     Rate and Rhythm: Regular rhythm. Tachycardia present.     Pulses: Normal pulses.     Heart sounds: Normal heart sounds, S1 normal and S2 normal. No murmur heard.    No friction rub. No gallop.  Pulmonary:     Effort: Pulmonary effort is normal. No respiratory distress.     Breath sounds: Normal breath sounds.  Abdominal:     General: Bowel sounds are decreased. There is distension.     Palpations: Abdomen is soft.     Tenderness: There is generalized abdominal tenderness. There is no guarding or rebound.     Hernia: No hernia is present.  Musculoskeletal:  General: No swelling.     Cervical back: Full passive range of motion without pain, normal range of motion and neck supple. No spinous process tenderness or muscular tenderness. Normal range of motion.     Right lower leg: No edema.     Left lower leg: No edema.  Skin:    General: Skin is warm and dry.     Capillary Refill: Capillary refill takes less than 2 seconds.     Coloration: Skin is jaundiced.     Findings: No ecchymosis, erythema, rash or wound.  Neurological:     General: No focal deficit present.     Mental Status: She is alert and oriented to person, place, and time.     GCS: GCS eye subscore is 4. GCS verbal subscore is 5. GCS motor subscore is  6.     Cranial Nerves: Cranial nerves 2-12 are intact.     Sensory: Sensation is intact.     Motor: Motor function is intact.     Coordination: Coordination is intact.  Psychiatric:        Attention and Perception: Attention normal.        Mood and Affect: Mood normal.        Speech: Speech normal.        Behavior: Behavior normal.     ED Results / Procedures / Treatments   Labs (all labs ordered are listed, but only abnormal results are displayed) Labs Reviewed  CBC WITH DIFFERENTIAL/PLATELET - Abnormal; Notable for the following components:      Result Value   WBC 21.7 (*)    RBC 2.26 (*)    Hemoglobin 8.5 (*)    HCT 25.5 (*)    MCV 112.8 (*)    MCH 37.6 (*)    RDW 19.9 (*)    Neutro Abs 18.3 (*)    Monocytes Absolute 1.1 (*)    Abs Immature Granulocytes 0.26 (*)    All other components within normal limits  BASIC METABOLIC PANEL - Abnormal; Notable for the following components:   Sodium 118 (*)    Chloride 78 (*)    CO2 18 (*)    BUN 27 (*)    Calcium 7.9 (*)    Anion gap 22 (*)    All other components within normal limits  HEPATIC FUNCTION PANEL - Abnormal; Notable for the following components:   Albumin 2.3 (*)    AST 311 (*)    ALT 109 (*)    Alkaline Phosphatase 335 (*)    Total Bilirubin 12.2 (*)    Bilirubin, Direct 7.2 (*)    Indirect Bilirubin 5.0 (*)    All other components within normal limits  AMMONIA - Abnormal; Notable for the following components:   Ammonia 44 (*)    All other components within normal limits  ETHANOL - Abnormal; Notable for the following components:   Alcohol, Ethyl (B) 158 (*)    All other components within normal limits  PROTIME-INR - Abnormal; Notable for the following components:   Prothrombin Time 16.3 (*)    INR 1.3 (*)    All other components within normal limits  ACETAMINOPHEN LEVEL - Abnormal; Notable for the following components:   Acetaminophen (Tylenol), Serum <10 (*)    All other components within normal limits   LACTIC ACID, PLASMA - Abnormal; Notable for the following components:   Lactic Acid, Venous >9.0 (*)    All other components within normal limits  LACTIC ACID, PLASMA - Abnormal;  Notable for the following components:   Lactic Acid, Venous 8.6 (*)    All other components within normal limits  CULTURE, BLOOD (ROUTINE X 2)  CULTURE, BLOOD (ROUTINE X 2)  LIPASE, BLOOD  URINALYSIS, W/ REFLEX TO CULTURE (INFECTION SUSPECTED)  POC OCCULT BLOOD, ED  TROPONIN I (HIGH SENSITIVITY)    EKG EKG Interpretation Date/Time:  Friday March 31 2023 00:09:43 EDT Ventricular Rate:  115 PR Interval:  158 QRS Duration:  91 QT Interval:  296 QTC Calculation: 408 R Axis:   99  Text Interpretation: Sinus tachycardia Biatrial enlargement Borderline right axis deviation Nonspecific T abnormalities, diffuse leads Confirmed by Gilda Crease [phone]) on 03/31/2023 12:44:06 AM  Radiology CT ABDOMEN PELVIS W CONTRAST  Result Date: 03/31/2023 CLINICAL DATA:  Mid to lower abdominal pain for 1 month, worsening in the last few days. Nausea, vomiting and constipation. EXAM: CT ABDOMEN AND PELVIS WITH CONTRAST TECHNIQUE: Multidetector CT imaging of the abdomen and pelvis was performed using the standard protocol following bolus administration of intravenous contrast. RADIATION DOSE REDUCTION: This exam was performed according to the departmental dose-optimization program which includes automated exposure control, adjustment of the mA and/or kV according to patient size and/or use of iterative reconstruction technique. CONTRAST:  75mL OMNIPAQUE IOHEXOL 300 MG/ML  SOLN COMPARISON:  02/26/2019, 03/05/2019. FINDINGS: Lower chest: There is a small left pleural effusion. Bronchial wall thickening is noted bilaterally with atelectasis or scarring at the lung bases. There is a stable 4 mm nodule in the right middle lobe, unchanged from 2020 and likely benign. Hepatobiliary: Hepatic steatosis is noted with focal fatty  sparing at the gallbladder fossa and left lobe of the liver. No biliary ductal dilatation. The gallbladder is without stones. Pancreas: Unremarkable. No pancreatic ductal dilatation or surrounding inflammatory changes. Spleen: Normal in size without focal abnormality. Adrenals/Urinary Tract: The adrenal glands are within normal limits. The kidneys enhance symmetrically. No renal calculus or hydronephrosis. The bladder is unremarkable. A stable cystic structure is noted inferior to the urinary bladder, possible urethral diverticulum versus nabothian cyst Stomach/Bowel: There is thickening of the walls of the distal esophagus and stomach with mucosal enhancement. No bowel obstruction, free air, or pneumatosis. No free air or pneumatosis is seen. There is inferior subluxation of the rectum below the pubococcygeal line compatible with pelvic floor dysfunction. Increased density is noted in the perirectal space bilaterally, greater on the right than on the left. No abscess is seen. There is evidence of partial colectomy with anastomotic site at the rectosigmoid colon. The appendix is not seen. There is rectal wall thickening involving the ascending colon and cecum. Vascular/Lymphatic: Aortic atherosclerosis. No enlarged abdominal or pelvic lymph nodes. Reproductive: Uterus and bilateral adnexa are unremarkable. Other: There is moderate-to-large ascites.  Anasarca is noted. Musculoskeletal: Degenerative changes are noted in the thoracolumbar spine. No acute osseous abnormality. IMPRESSION: 1. Thickening of the walls of the cecum and ascending colon, possible infectious or inflammatory colitis. 2. Mucosal enhancement with thickening of the walls of the distal esophagus and gastric folds suggesting gastritis/esophagitis. 3. Inferior displacement of the rectum below the pubococcygeal line suggesting pelvic floor dysfunction. There is mild perirectal fat stranding extending into the soft tissues in the perineum, greater on  the right than on the left. No abscess is seen. 4. Moderate-to-large ascites and anasarca. 5. Hepatic steatosis. 6. Aortic atherosclerosis. 7. Remaining incidental findings as described above. Electronically Signed   By: Thornell Sartorius M.D.   On: 03/31/2023 04:27    Procedures Procedures  Medications Ordered in ED Medications  cefTRIAXone (ROCEPHIN) 1 g in sodium chloride 0.9 % 100 mL IVPB (has no administration in time range)  metroNIDAZOLE (FLAGYL) IVPB 500 mg (has no administration in time range)  sodium chloride 0.9 % bolus 500 mL (500 mLs Intravenous Bolus 03/31/23 0141)  HYDROmorphone (DILAUDID) injection 1 mg (1 mg Intravenous Given 03/31/23 0142)  ondansetron (ZOFRAN) injection 4 mg (4 mg Intravenous Given 03/31/23 0141)  sodium chloride 0.9 % bolus 1,000 mL (1,000 mLs Intravenous Bolus 03/31/23 0320)  iohexol (OMNIPAQUE) 300 MG/ML solution 100 mL (75 mLs Intravenous Contrast Given 03/31/23 0327)    ED Course/ Medical Decision Making/ A&P                                 Medical Decision Making Amount and/or Complexity of Data Reviewed External Data Reviewed: notes. Labs: ordered. Decision-making details documented in ED Course. Radiology: ordered and independent interpretation performed. Decision-making details documented in ED Course. ECG/medicine tests: ordered and independent interpretation performed. Decision-making details documented in ED Course.  Risk Prescription drug management.   Differential Diagnosis considered includes, but not limited to: Cholelithiasis; cholecystitis; cholangitis; bowel obstruction; esophagitis; gastritis; peptic ulcer disease; pancreatitis; cardiac; metastatic CA; hepatitis; cirrhosis.  Patient presents to the emergency department stating that she has not been feeling well for some time.  Patient with diffuse abdominal pain.  She is ill-appearing.  She appears jaundiced.  Abdominal exam reveals decreased bowel sounds, diffuse distention with  generalized tenderness.  Patient with elevated transaminases, T. bili.  She admits to drinking daily.  Abnormalities felt to be secondary to undiagnosed cirrhosis.  CT scan with possible infectious or inflammatory colitis.  Suspect this could also be edema secondary to liver disease.  There is moderate to large ascites.  Administered Rocephin to cover for SBP.  Add Flagyl to cover for colitis.  Lactic acid very elevated at arrival.  It was greater than 9.  This is felt to be secondary to her liver dysfunction.  She was given IV fluids and repeat is 8.6.   Patient with hyponatremia.  No seizures.  Administered normal saline here in the ED.  Discussed with Dr. Marchelle Gearing, on-call for critical care.  Patient's history, presentation, lab work and CT findings were discussed.  Dr. Marchelle Gearing feels that the patient can be admitted by the hospitalist here, continue IV fluids.  Agrees with current treatment plan.  CRITICAL CARE Performed by: Gilda Crease   Total critical care time: 35 minutes  Critical care time was exclusive of separately billable procedures and treating other patients.  Critical care was necessary to treat or prevent imminent or life-threatening deterioration.  Critical care was time spent personally by me on the following activities: development of treatment plan with patient and/or surrogate as well as nursing, discussions with consultants, evaluation of patient's response to treatment, examination of patient, obtaining history from patient or surrogate, ordering and performing treatments and interventions, ordering and review of laboratory studies, ordering and review of radiographic studies, pulse oximetry and re-evaluation of patient's condition.         Final Clinical Impression(s) / ED Diagnoses Final diagnoses:  Ascites due to alcoholic cirrhosis (HCC)  Hyponatremia  Colitis    Rx / DC Orders ED Discharge Orders     None         Shelbia Scinto,  Canary Brim, MD 03/31/23 [phone]

## 2023-03-31 NOTE — H&P (Signed)
History and Physical  Christina Miller ZOX:[id number] DOB: February 16, 1966 DOA: 04-01-23  PCP: Marcine Matar, MD   Chief Complaint: Abdominal pain  HPI: Christina Miller is a 57 y.o. female with medical history significant for GERD, hypertension, MS alcohol abuse being admitted to the hospital with hyponatremia, liver failure and gastritis/colitis.  Patient is not a very good historian, states that over the last several weeks she has had gradual development of abdominal fullness without tenderness, intermittent fevers, without chills.  Intermittent nausea with vomiting.  States that she drinks a dozen beers a day, but has been trying to cut back.  She last drank just before she came to the hospital, taking a couple shots and a little bit of beer.  Says that she has a nonproductive cough.  Denies any chest pain, denies any hematemesis or blood in her stools.  Denies any prior history of liver disease.  ED Course: On evaluation here in the emergency department, she is afebrile and tachycardic blood pressure is stable but a little on the low side.  She is saturating well on room air.  Lab work as detailed below significant for WBC 22, hemoglobin 8.5, sodium 118, potassium 5.1, normal renal function, AST/ALT 311/109 ammonia 44, total bilirubin 12.2, direct bilirubin 7.2.  CT scan as detailed below with evidence of gastritis, esophagitis, colitis.  She was given empiric IV Rocephin and IV Flagyl, and hospitalist was contacted for admission.  Review of Systems: Please see HPI for pertinent positives and negatives. A complete 10 system review of systems are otherwise negative.  Past Medical History:  Diagnosis Date   Abnormal Pap smear    cryo   Allergy    Anemia    Bone fracture    ankle   Cancer (HCC) 1987   cervical Cancer   Colonic diverticular abscess 02/12/2019   COPD (chronic obstructive pulmonary disease) (HCC)    Emphysema    Emphysema of lung (HCC)    GERD (gastroesophageal  reflux disease)    Headache(784.0)    Hypertension    Infection    urinary tract infection   MS (multiple sclerosis) (HCC)    Neuromuscular disorder (HCC)    hands and feet face and lip and muscle weakness due to MS   Pelvic abscess in female    Sepsis due to Escherichia coli (E. coli) (HCC) 03/09/2018   Past Surgical History:  Procedure Laterality Date   ARM WOUND REPAIR / CLOSURE     BREAST BIOPSY Left 07/03/2012   COLPOSCOPY     DILATION AND CURETTAGE OF UTERUS     IR CATHETER TUBE CHANGE  04/04/2018   IR CATHETER TUBE CHANGE  05/16/2018   IR CATHETER TUBE CHANGE  07/05/2018   IR CATHETER TUBE CHANGE  09/21/2018   IR RADIOLOGIST EVAL & MGMT  03/29/2018   IR RADIOLOGIST EVAL & MGMT  04/12/2018   IR RADIOLOGIST EVAL & MGMT  02/20/2019   JP DRAIN TUBE     PROCTOSCOPY N/A 01/30/2019   Procedure: RIGID PROCTOSCOPY;  Surgeon: Karie Soda, MD;  Location: [facility];  Service: General;  Laterality: N/A;   ROBOT ASSISTED LAPAROSCOPIC PARTIAL COLECTOMY  01/30/2019   for diverticulitis.  LSO as well   SALPINGOOPHORECTOMY Left 01/30/2019   LSO at time of sigmoid colectomy   TUBAL LIGATION      Social History:  reports that she has been smoking cigarettes. She has a 15 pack-year smoking history. She has quit using smokeless tobacco. She reports current  alcohol use. She reports that she does not use drugs.   Allergies  Allergen Reactions   Hydrocodone Nausea And Vomiting   Bactrim [Sulfamethoxazole-Trimethoprim] Rash    Family History  Problem Relation Age of Onset   Diabetes Father    Diabetes Sister    Hypertension Sister    Diabetes Brother    Hypertension Brother    Lung cancer Mother    Colon cancer Neg Hx    Stomach cancer Neg Hx    Pancreatic cancer Neg Hx    Esophageal cancer Neg Hx    Rectal cancer Neg Hx      Prior to Admission medications   Medication Sig Start Date End Date Taking? Authorizing Provider  albuterol (VENTOLIN HFA) 108 (90 Base) MCG/ACT inhaler Inhale 2  puffs into the lungs every 6 (six) hours as needed for wheezing or shortness of breath. Patient not taking: Reported on 03/31/2023 08/22/19   Marcine Matar, MD  amLODipine (NORVASC) 10 MG tablet Take 1 tablet (10 mg total) by mouth daily. Patient not taking: Reported on 03/31/2023 04/02/20   Marcine Matar, MD  famotidine (PEPCID) 20 MG tablet Take 1 tablet (20 mg total) by mouth 2 (two) times daily as needed for heartburn or indigestion. Patient not taking: Reported on 03/31/2023 03/04/20   Charlynne Pander, MD  hydrochlorothiazide (HYDRODIURIL) 12.5 MG tablet Take 1 tablet (12.5 mg total) by mouth daily. Patient not taking: Reported on 03/31/2023 09/15/20   Marcine Matar, MD  nicotine (NICODERM CQ - DOSED IN MG/24 HOURS) 21 mg/24hr patch Place 1 patch (21 mg total) onto the skin daily. Patient not taking: Reported on 03/31/2023 04/02/20   Marcine Matar, MD  nicotine polacrilex (NICORETTE) 2 MG gum Take 1 each (2 mg total) by mouth as needed for smoking cessation. Max 30 pieces/day Patient not taking: Reported on 03/31/2023 05/14/20   Marcine Matar, MD    Physical Exam: BP 118/87   Pulse (!) 114   Temp 97.6 F (36.4 C) (Oral)   Resp 10   Ht 5\' 1"  (1.549 m)   Wt 31 kg   LMP 06/20/2012   SpO2 99%   BMI 12.91 kg/m   General: Patient is thin, jaundiced, emaciated and looks much older than her stated age.  She is alert, cooperative and oriented x 4.  She is tired appearing and overall looks dry. Eyes: EOMI, clear conjuctivae, icteric sclerea Neck: supple, no masses, trachea mildline  Cardiovascular: Tachycardic and regular, no murmurs or rubs, she has 2+ pitting bilateral lower extremity edema Respiratory: clear to auscultation bilaterally, no wheezes, no crackles  Abdomen: soft, diffusely tender, distended with fluid wave, normal bowel tones heard  Skin: dry, no rashes  Musculoskeletal: no joint effusions, normal range of motion  Psychiatric: appropriate affect, normal  speech  Neurologic: extraocular muscles intact, clear speech, moving all extremities with intact sensorium         Labs on Admission:  Basic Metabolic Panel: Recent Labs  Lab 03/31/23 0019  NA 118*  K 5.1  CL 78*  CO2 18*  GLUCOSE 90  BUN 27*  CREATININE 0.80  CALCIUM 7.9*   Liver Function Tests: Recent Labs  Lab 03/31/23 0019  AST 311*  ALT 109*  ALKPHOS 335*  BILITOT 12.2*  PROT 6.7  ALBUMIN 2.3*   Recent Labs  Lab 03/31/23 0019  LIPASE 31   Recent Labs  Lab 03/31/23 0019  AMMONIA 44*   CBC: Recent Labs  Lab 03/31/23  0019  WBC 21.7*  NEUTROABS 18.3*  HGB 8.5*  HCT 25.5*  MCV 112.8*  PLT 272   Cardiac Enzymes: No results for input(s): "CKTOTAL", "CKMB", "CKMBINDEX", "TROPONINI" in the last 168 hours.  BNP (last 3 results) No results for input(s): "BNP" in the last 8760 hours.  ProBNP (last 3 results) No results for input(s): "PROBNP" in the last 8760 hours.  CBG: No results for input(s): "GLUCAP" in the last 168 hours.  Radiological Exams on Admission: CT ABDOMEN PELVIS W CONTRAST  Result Date: 03/31/2023 CLINICAL DATA:  Mid to lower abdominal pain for 1 month, worsening in the last few days. Nausea, vomiting and constipation. EXAM: CT ABDOMEN AND PELVIS WITH CONTRAST TECHNIQUE: Multidetector CT imaging of the abdomen and pelvis was performed using the standard protocol following bolus administration of intravenous contrast. RADIATION DOSE REDUCTION: This exam was performed according to the departmental dose-optimization program which includes automated exposure control, adjustment of the mA and/or kV according to patient size and/or use of iterative reconstruction technique. CONTRAST:  75mL OMNIPAQUE IOHEXOL 300 MG/ML  SOLN COMPARISON:  02/26/2019, 03/05/2019. FINDINGS: Lower chest: There is a small left pleural effusion. Bronchial wall thickening is noted bilaterally with atelectasis or scarring at the lung bases. There is a stable 4 mm nodule in  the right middle lobe, unchanged from 2020 and likely benign. Hepatobiliary: Hepatic steatosis is noted with focal fatty sparing at the gallbladder fossa and left lobe of the liver. No biliary ductal dilatation. The gallbladder is without stones. Pancreas: Unremarkable. No pancreatic ductal dilatation or surrounding inflammatory changes. Spleen: Normal in size without focal abnormality. Adrenals/Urinary Tract: The adrenal glands are within normal limits. The kidneys enhance symmetrically. No renal calculus or hydronephrosis. The bladder is unremarkable. A stable cystic structure is noted inferior to the urinary bladder, possible urethral diverticulum versus nabothian cyst Stomach/Bowel: There is thickening of the walls of the distal esophagus and stomach with mucosal enhancement. No bowel obstruction, free air, or pneumatosis. No free air or pneumatosis is seen. There is inferior subluxation of the rectum below the pubococcygeal line compatible with pelvic floor dysfunction. Increased density is noted in the perirectal space bilaterally, greater on the right than on the left. No abscess is seen. There is evidence of partial colectomy with anastomotic site at the rectosigmoid colon. The appendix is not seen. There is rectal wall thickening involving the ascending colon and cecum. Vascular/Lymphatic: Aortic atherosclerosis. No enlarged abdominal or pelvic lymph nodes. Reproductive: Uterus and bilateral adnexa are unremarkable. Other: There is moderate-to-large ascites.  Anasarca is noted. Musculoskeletal: Degenerative changes are noted in the thoracolumbar spine. No acute osseous abnormality. IMPRESSION: 1. Thickening of the walls of the cecum and ascending colon, possible infectious or inflammatory colitis. 2. Mucosal enhancement with thickening of the walls of the distal esophagus and gastric folds suggesting gastritis/esophagitis. 3. Inferior displacement of the rectum below the pubococcygeal line suggesting pelvic  floor dysfunction. There is mild perirectal fat stranding extending into the soft tissues in the perineum, greater on the right than on the left. No abscess is seen. 4. Moderate-to-large ascites and anasarca. 5. Hepatic steatosis. 6. Aortic atherosclerosis. 7. Remaining incidental findings as described above. Electronically Signed   By: Thornell Sartorius M.D.   On: 03/31/2023 04:27    Assessment/Plan Christina Miller is a 57 y.o. female with medical history significant for GERD, hypertension, MS, alcohol abuse being admitted to the hospital with hyponatremia, liver failure and gastritis/colitis.  Hyponatremia-initial sodium 118, patient without obvious mental status changes  or other sequelae.  This is likely related to her alcohol abuse and somewhat chronic. -Inpatient admission to stepdown -Check UA, urine osm, serum osm -Normal saline infusion at 75 cc/h, with fluid restriction -Check stat BMP now, and every 4 hours - if not improving will consult nephrology  Abnormal LFTs and hyperbilirubinemia-with appearance of fatty liver, no ductal dilatation seen on CT. Unclear that she has true liver cirrhosis. -Avoid hepatotoxins -Check acute hepatitis panel -Right upper quadrant ultrasound [facility] gastroenterology consultation requested  Abdominal ascites, and anasarca-likely due to her liver failure.  Abdomen is also tender on palpation, and there certainly is concern for SBP. -Ultrasound-guided diagnostic and therapeutic paracentesis, with appropriate labs ordered -Treat empirically with IV Rocephin 2 g daily  Gastritis/esophagitis/colitis-patient with significant leukocytosis, subjective fevers, however no diarrhea.  These findings may represent acute infection versus inflammatory process. -Treat empirically with IV Rocephin and IV Flagyl for possible bacterial infection  Anemia-without any report of blood loss and guaiac negative stool, likely chronic and related to her alcohol abuse. -Check  anemia panel -Follow with daily labs  Lactic acidosis-likely due to liver disease as above  Alcohol abuse-patient reports drinking 12 pack of beer daily, last drink was 9/12 prior to presenting to the emergency department. -Thiamine, folate, multivitamin -P.o. Ativan per CIWA protocol  Significant leukocytosis-likely due to bowel inflammation as above  DVT prophylaxis: Lovenox     Code Status: Full Code  Consults called: [place] gastro[facility]  Admission status: The appropriate patient status for this patient is INPATIENT. Inpatient status is judged to be reasonable and necessary in order to provide the required intensity of service to ensure the patient's safety. The patient's presenting symptoms, physical exam findings, and initial radiographic and laboratory data in the context of their chronic comorbidities is felt to place them at high risk for further clinical deterioration. Furthermore, it is not anticipated that the patient will be medically stable for discharge from the hospital within 2 midnights of admission.    I certify that at the point of admission it is my clinical judgment that the patient will require inpatient hospital care spanning beyond 2 midnights from the point of admission due to high intensity of service, high risk for further deterioration and high frequency of surveillance required  Time spent: 58 minutes  Temple Sporer Sharlette Dense MD [facility] Pager [phone]  If 7PM-7AM, please contact night-coverage [email or web address] Password [facility]  03/31/2023, 7:32 AM

## 2023-03-31 NOTE — Plan of Care (Signed)
  Problem: Pain Managment: Goal: General experience of comfort will improve Outcome: Progressing   Problem: Safety: Goal: Ability to remain free from injury will improve Outcome: Progressing   Problem: Elimination: Goal: Will not experience complications related to bowel motility Outcome: Not Progressing Goal: Will not experience complications related to urinary retention Outcome: Not Progressing

## 2023-03-31 NOTE — ED Notes (Signed)
Unsuccessful phlebotomy attempt for blood cultures.

## 2023-04-01 DIAGNOSIS — F1010 Alcohol abuse, uncomplicated: Secondary | ICD-10-CM | POA: Diagnosis present

## 2023-04-01 DIAGNOSIS — K7689 Other specified diseases of liver: Secondary | ICD-10-CM | POA: Diagnosis not present

## 2023-04-01 DIAGNOSIS — R933 Abnormal findings on diagnostic imaging of other parts of digestive tract: Secondary | ICD-10-CM

## 2023-04-01 DIAGNOSIS — R1084 Generalized abdominal pain: Secondary | ICD-10-CM

## 2023-04-01 DIAGNOSIS — D72828 Other elevated white blood cell count: Secondary | ICD-10-CM | POA: Diagnosis not present

## 2023-04-01 DIAGNOSIS — D539 Nutritional anemia, unspecified: Secondary | ICD-10-CM | POA: Diagnosis present

## 2023-04-01 DIAGNOSIS — I959 Hypotension, unspecified: Secondary | ICD-10-CM

## 2023-04-01 DIAGNOSIS — K7200 Acute and subacute hepatic failure without coma: Secondary | ICD-10-CM | POA: Diagnosis not present

## 2023-04-01 DIAGNOSIS — K7011 Alcoholic hepatitis with ascites: Secondary | ICD-10-CM

## 2023-04-01 DIAGNOSIS — E871 Hypo-osmolality and hyponatremia: Secondary | ICD-10-CM | POA: Diagnosis present

## 2023-04-01 DIAGNOSIS — N3000 Acute cystitis without hematuria: Secondary | ICD-10-CM | POA: Diagnosis present

## 2023-04-01 DIAGNOSIS — L8990 Pressure ulcer of unspecified site, unspecified stage: Secondary | ICD-10-CM | POA: Diagnosis present

## 2023-04-01 LAB — CBC WITH DIFFERENTIAL/PLATELET
Abs Immature Granulocytes: 0.12 10*3/uL — ABNORMAL HIGH (ref 0.00–0.07)
Basophils Absolute: 0 10*3/uL (ref 0.0–0.1)
Basophils Relative: 0 %
Eosinophils Absolute: 0.1 10*3/uL (ref 0.0–0.5)
Eosinophils Relative: 0 %
HCT: 16.5 % — ABNORMAL LOW (ref 36.0–46.0)
Hemoglobin: 5.2 g/dL — CL (ref 12.0–15.0)
Immature Granulocytes: 1 %
Lymphocytes Relative: 12 %
Lymphs Abs: 1.6 10*3/uL (ref 0.7–4.0)
MCH: 37.4 pg — ABNORMAL HIGH (ref 26.0–34.0)
MCHC: 31.5 g/dL (ref 30.0–36.0)
MCV: 118.7 fL — ABNORMAL HIGH (ref 80.0–100.0)
Monocytes Absolute: 0.8 10*3/uL (ref 0.1–1.0)
Monocytes Relative: 6 %
Neutro Abs: 10.7 10*3/uL — ABNORMAL HIGH (ref 1.7–7.7)
Neutrophils Relative %: 81 %
Platelets: 139 10*3/uL — ABNORMAL LOW (ref 150–400)
RBC: 1.39 MIL/uL — ABNORMAL LOW (ref 3.87–5.11)
RDW: 19.6 % — ABNORMAL HIGH (ref 11.5–15.5)
WBC: 13.2 10*3/uL — ABNORMAL HIGH (ref 4.0–10.5)
nRBC: 0.7 % — ABNORMAL HIGH (ref 0.0–0.2)

## 2023-04-01 LAB — BASIC METABOLIC PANEL
Anion gap: 19 — ABNORMAL HIGH (ref 5–15)
Anion gap: 14 (ref 5–15)
Anion gap: 12 (ref 5–15)
BUN: 28 mg/dL — ABNORMAL HIGH (ref 6–20)
BUN: 28 mg/dL — ABNORMAL HIGH (ref 6–20)
BUN: 27 mg/dL — ABNORMAL HIGH (ref 6–20)
CO2: 13 mmol/L — ABNORMAL LOW (ref 22–32)
CO2: 19 mmol/L — ABNORMAL LOW (ref 22–32)
CO2: 19 mmol/L — ABNORMAL LOW (ref 22–32)
Calcium: 7.2 mg/dL — ABNORMAL LOW (ref 8.9–10.3)
Calcium: 7.4 mg/dL — ABNORMAL LOW (ref 8.9–10.3)
Calcium: 7.2 mg/dL — ABNORMAL LOW (ref 8.9–10.3)
Chloride: 88 mmol/L — ABNORMAL LOW (ref 98–111)
Chloride: 88 mmol/L — ABNORMAL LOW (ref 98–111)
Chloride: 92 mmol/L — ABNORMAL LOW (ref 98–111)
Creatinine, Ser: 0.76 mg/dL (ref 0.44–1.00)
Creatinine, Ser: 0.78 mg/dL (ref 0.44–1.00)
Creatinine, Ser: 0.73 mg/dL (ref 0.44–1.00)
GFR, Estimated: >60 mL/min (ref >60)
GFR, Estimated: >60 mL/min (ref >60)
GFR, Estimated: >60 mL/min (ref >60)
Glucose, Bld: 149 mg/dL — ABNORMAL HIGH (ref 70–99)
Glucose, Bld: 117 mg/dL — ABNORMAL HIGH (ref 70–99)
Glucose, Bld: 117 mg/dL — ABNORMAL HIGH (ref 70–99)
Potassium: 4.5 mmol/L (ref 3.5–5.1)
Potassium: 4.4 mmol/L (ref 3.5–5.1)
Potassium: 4 mmol/L (ref 3.5–5.1)
Sodium: 120 mmol/L — ABNORMAL LOW (ref 135–145)
Sodium: 121 mmol/L — ABNORMAL LOW (ref 135–145)
Sodium: 123 mmol/L — ABNORMAL LOW (ref 135–145)

## 2023-04-01 LAB — HEPATIC FUNCTION PANEL
ALT: 144 U/L — ABNORMAL HIGH (ref 0–44)
AST: 731 U/L — ABNORMAL HIGH (ref 15–41)
Albumin: 2.9 g/dL — ABNORMAL LOW (ref 3.5–5.0)
Alkaline Phosphatase: 170 U/L — ABNORMAL HIGH (ref 38–126)
Bilirubin, Direct: 7.6 mg/dL — ABNORMAL HIGH (ref 0.0–0.2)
Indirect Bilirubin: 5.4 mg/dL — ABNORMAL HIGH (ref 0.3–0.9)
Total Bilirubin: 13 mg/dL — ABNORMAL HIGH (ref 0.3–1.2)
Total Protein: 5.2 g/dL — ABNORMAL LOW (ref 6.5–8.1)

## 2023-04-01 LAB — BLOOD GAS, VENOUS
Acid-base deficit: 10.6 mmol/L — ABNORMAL HIGH (ref 0.0–2.0)
Bicarbonate: 15.2 mmol/L — ABNORMAL LOW (ref 20.0–28.0)
O2 Saturation: 60 %
Patient temperature: 36.1
pCO2, Ven: 32 mmHg — ABNORMAL LOW (ref 44–60)
pH, Ven: 7.28 (ref 7.25–7.43)
pO2, Ven: 39 mmHg (ref 32–45)

## 2023-04-01 LAB — HEPATITIS A ANTIBODY, TOTAL: hep A Total Ab: REACTIVE — AB

## 2023-04-01 LAB — AMMONIA: Ammonia: 74 umol/L — ABNORMAL HIGH (ref 9–35)

## 2023-04-01 LAB — PREPARE RBC (CROSSMATCH)

## 2023-04-01 LAB — HEPATITIS B SURFACE ANTIBODY,QUALITATIVE: Hep B S Ab: NONREACTIVE

## 2023-04-01 LAB — LACTIC ACID, PLASMA: Lactic Acid, Venous: 2.5 mmol/L (ref 0.5–1.9)

## 2023-04-01 LAB — HEMOGLOBIN AND HEMATOCRIT, BLOOD
HCT: 31 % — ABNORMAL LOW (ref 36.0–46.0)
Hemoglobin: 10.5 g/dL — ABNORMAL LOW (ref 12.0–15.0)

## 2023-04-01 LAB — GLUCOSE, CAPILLARY: Glucose-Capillary: 136 mg/dL — ABNORMAL HIGH (ref 70–99)

## 2023-04-01 LAB — PROTIME-INR
INR: 2.7 — ABNORMAL HIGH (ref 0.8–1.2)
Prothrombin Time: 28.5 s — ABNORMAL HIGH (ref 11.4–15.2)

## 2023-04-01 LAB — HEPATITIS B CORE ANTIBODY, TOTAL: Hep B Core Total Ab: NONREACTIVE

## 2023-04-01 LAB — SEDIMENTATION RATE: Sed Rate: 3 mm/h (ref 0–22)

## 2023-04-01 LAB — C-REACTIVE PROTEIN: CRP: 3.1 mg/dL — ABNORMAL HIGH (ref <1.0)

## 2023-04-01 MED ORDER — MIDODRINE HCL 5 MG PO TABS
5.0000 mg | ORAL_TABLET | Freq: Once | ORAL | Status: AC
  Administered 2023-04-01: 5 mg via ORAL
  Filled 2023-04-01: qty 1

## 2023-04-01 MED ORDER — SODIUM CHLORIDE 0.9 % IV BOLUS
500.0000 mL | Freq: Once | INTRAVENOUS | Status: AC
  Administered 2023-04-01: 500 mL via INTRAVENOUS

## 2023-04-01 MED ORDER — METHYLPREDNISOLONE SODIUM SUCC 40 MG IJ SOLR
40.0000 mg | Freq: Every day | INTRAMUSCULAR | Status: DC
  Administered 2023-04-01 – 2023-04-05 (×5): 40 mg via INTRAVENOUS
  Filled 2023-04-01 (×5): qty 1

## 2023-04-01 MED ORDER — LACTULOSE 10 GM/15ML PO SOLN
20.0000 g | Freq: Three times a day (TID) | ORAL | Status: DC
  Administered 2023-04-01 – 2023-04-06 (×8): 20 g via ORAL
  Filled 2023-04-01 (×9): qty 30

## 2023-04-01 MED ORDER — SODIUM CHLORIDE 0.9 % IV BOLUS
1000.0000 mL | Freq: Once | INTRAVENOUS | Status: AC
  Administered 2023-04-01: 1000 mL via INTRAVENOUS

## 2023-04-01 MED ORDER — VITAMIN K1 10 MG/ML IJ SOLN
10.0000 mg | Freq: Once | INTRAVENOUS | Status: DC
  Filled 2023-04-01: qty 1

## 2023-04-01 MED ORDER — MIDODRINE HCL 5 MG PO TABS
10.0000 mg | ORAL_TABLET | Freq: Once | ORAL | Status: AC
  Administered 2023-04-01: 10 mg via ORAL
  Filled 2023-04-01: qty 2

## 2023-04-01 MED ORDER — SODIUM CHLORIDE 0.9% IV SOLUTION: Freq: Once | INTRAVENOUS | Status: AC

## 2023-04-01 MED ORDER — ENSURE ENLIVE PO LIQD
237.0000 mL | Freq: Two times a day (BID) | ORAL | Status: DC
  Administered 2023-04-01 (×2): 237 mL via ORAL

## 2023-04-01 MED ORDER — VANCOMYCIN HCL 750 MG/150ML IV SOLN
750.0000 mg | Freq: Once | INTRAVENOUS | Status: AC
  Administered 2023-04-01: 750 mg via INTRAVENOUS
  Filled 2023-04-01: qty 150

## 2023-04-01 MED ORDER — SODIUM CHLORIDE 0.9 % IV SOLN
2.0000 g | Freq: Two times a day (BID) | INTRAVENOUS | Status: DC
  Administered 2023-04-01: 2 g via INTRAVENOUS
  Filled 2023-04-01: qty 12.5

## 2023-04-01 MED ORDER — SODIUM CHLORIDE 0.9 % IV SOLN
2.0000 g | Freq: Every day | INTRAVENOUS | Status: AC
  Administered 2023-04-01 – 2023-04-05 (×5): 2 g via INTRAVENOUS
  Filled 2023-04-01 (×5): qty 20

## 2023-04-01 MED ORDER — ALBUMIN HUMAN 25 % IV SOLN
50.0000 g | Freq: Once | INTRAVENOUS | Status: AC
  Administered 2023-04-01: 50 g via INTRAVENOUS
  Filled 2023-04-01: qty 200

## 2023-04-01 MED ORDER — VANCOMYCIN HCL 750 MG/150ML IV SOLN: 750.0000 mg | INTRAVENOUS | Status: DC

## 2023-04-01 MED ORDER — VITAMIN K1 10 MG/ML IJ SOLN
10.0000 mg | Freq: Every day | INTRAVENOUS | Status: AC
  Administered 2023-04-01 – 2023-04-03 (×3): 10 mg via INTRAVENOUS
  Filled 2023-04-01 (×3): qty 1

## 2023-04-01 NOTE — Assessment & Plan Note (Signed)
-   CT notes thickened walls of cecum and ascending colon along with mucosal enhancement and thickening of walls of distal esophagus and gastric folds -Findings may be in setting of inflammatory context -GI following as well.  She may at some point need further endoscopy evaluation when more stable

## 2023-04-01 NOTE — Assessment & Plan Note (Addendum)
-   Presumed bone marrow suppression from excessive alcohol abuse - Folate also low, 2.8 - B12 adequate, 868 -Continue folic acid and multivitamin  -Hgb down to 5.2 g/dL this morning.  Possibly hemodilution as received fluid resuscitation since admission due to hypotension.  FOBT negative -Transfusing 2 units PRBC -Trend H/H

## 2023-04-01 NOTE — Progress Notes (Addendum)
Gastroenterology Inpatient Follow-up Note   PATIENT IDENTIFICATION  Christina Miller is a 57 y.o. female Hospital Day: 3  SUBJECTIVE  The patient's chart has been reviewed. The patient's labs have been reviewed. No evidence of SBP based on the tap.  SAAG difficult to calculate due to low albumin and albumin in the ascites fluid being too low.  Total protein low as well so makes heart failure seem less likely. INR has elevated significantly from yesterday into today and patient still has an elevated lactic acidemia that is not clearing. CBC still pending this morning. Discriminant function has increased substantially up to 84.3. Today the patient is feeling okay.  Her sister is with her at her bedside.  She wants to eat if possible. The patient has clinical evidence of progressed liver disease in regards to LE edema, ascites formation, continued abdominal pain, and deepening jaundice. She denies any fevers or chills. She feels her abdomen is improved after her ascites tap yesterday.   OBJECTIVE  Scheduled Inpatient Medications:   Chlorhexidine Gluconate Cloth  6 each Topical Daily   folic acid  1 mg Oral Daily   methylPREDNISolone (SOLU-MEDROL) injection  40 mg Intravenous Daily   multivitamin with minerals  1 tablet Oral Daily   pantoprazole (PROTONIX) IV  40 mg Intravenous Q24H   thiamine  100 mg Oral Daily   Or   thiamine  100 mg Intravenous Daily   Continuous Inpatient Infusions:   cefTRIAXone (ROCEPHIN)  IV 2 g (04/01/23 1402)   phytonadione (VITAMIN K) 10 mg in dextrose 5 % 50 mL IVPB Stopped (04/01/23 0932)   PRN Inpatient Medications: albuterol, ibuprofen, LORazepam, ondansetron **OR** ondansetron (ZOFRAN) IV, mouth rinse, oxyCODONE, polyethylene glycol, traZODone   Physical Examination  Temp:  [97.4 F (36.3 C)-97.9 F (36.6 C)] 97.6 F (36.4 C) (09/14 1351) Pulse Rate:  [87-121] 91 (09/14 1351) Resp:  [7-17] 11 (09/14 1351) BP: (76-152)/(40-84) 137/68  (09/14 1351) SpO2:  [91 %-100 %] 94 % (09/14 1351) Temp (24hrs), Avg:97.6 F (36.4 C), Min:97.4 F (36.3 C), Max:97.9 F (36.6 C)  Weight: 31 kg GEN: NAD, appears older than stated age, appears chronically ill but is nontoxic, sister with her PSYCH: Cooperative, without pressured speech EYE: Icteric sclerae ENT: MMM CV: Tachycardic in the low 100s RESP: No audible wheezing GI: NABS, soft, protuberant abdomen, mild TTP generalized throughout (though improved from yesterday's previous ascites tap evaluation), volitional guarding present, no rebound MSK/EXT: Trace bilateral pedal edema present SKIN: Jaundiced, spider angiomata noted on upper thorax NEURO:  Alert & Oriented x 3, no focal deficits, no evidence of asterixis   Review of Data   Laboratory Studies   Recent Labs  Lab 04/01/23 0728  NA 123*  K 4.0  CL 92*  CO2 19*  BUN 27*  CREATININE 0.73  GLUCOSE 117*  CALCIUM 7.2*   Recent Labs  Lab 04/01/23 0614  AST 731*  ALT 144*  ALKPHOS 170*    Recent Labs  Lab 03/31/23 0019 04/01/23 0614  WBC 21.7* 13.2*  HGB 8.5* 5.2*  HCT 25.5* 16.5*  PLT 272 139*   Recent Labs  Lab 03/31/23 0019 04/01/23 0614  INR 1.3* 2.7*   MELD 3.0: 32 at 04/01/2023  7:28 AM MELD-Na: 32 at 04/01/2023  7:28 AM Calculated from: Serum Creatinine: 0.73 mg/dL (Using min of 1 mg/dL) at 1/61/0960  4:54 AM Serum Sodium: 123 mmol/L (Using min of 125 mmol/L) at 04/01/2023  7:28 AM Total Bilirubin: 13.0 mg/dL at 0/98/1191  6:14 AM Serum Albumin: 2.9 g/dL at 1/91/4782  9:56 AM INR(ratio): 2.7 at 04/01/2023  6:14 AM Age at listing (hypothetical): 57 years Sex: Female at 04/01/2023  7:28 AM  Imaging Studies  US Paracentesis  Result Date: 03/31/2023 INDICATION: Patient with history of alcohol use disorder, elevated liver function tests, abdominal pain, ascites, fatty liver by imaging, ascites. Request received for diagnostic and therapeutic paracentesis up to 2 liters. EXAM: ULTRASOUND GUIDED  DIAGNOSTIC AND THERAPEUTIC PARACENTESIS MEDICATIONS: 8 ml 1% lidocaine COMPLICATIONS: None immediate. PROCEDURE: Informed written consent was obtained from the patient after a discussion of the risks, benefits and alternatives to treatment. A timeout was performed prior to the initiation of the procedure. Initial ultrasound scanning demonstrates a small amount of ascites within the left lower abdominal quadrant. The left lower abdomen was prepped and draped in the usual sterile fashion. 1% lidocaine was used for local anesthesia. Following this, a 19 gauge, 7-cm, Yueh catheter was introduced. An ultrasound image was saved for documentation purposes. The paracentesis was performed. The catheter was removed and a dressing was applied. The patient tolerated the procedure well without immediate post procedural complication. FINDINGS: A total of approximately 1 liter of yellow fluid was removed. Samples were sent to the laboratory as requested by the clinical team. IMPRESSION: Successful ultrasound-guided paracentesis yielding 1 liter of peritoneal fluid. PLAN: If the patient eventually requires >/=2 paracenteses in a 30 day period, candidacy for formal evaluation by the [facility] will be assessed. Performed by: Artemio Aly Electronically Signed   By: Marliss Coots M.D.   On: 03/31/2023 15:41   DG Chest 2 View  Result Date: 03/31/2023 CLINICAL DATA:  Elevated white blood cell count EXAM: CHEST - 2 VIEW COMPARISON:  X-ray 12/22/2020 FINDINGS: Normal cardiopericardial silhouette. No pneumothorax or consolidation or edema. On lateral view there are small dependent pleural effusions posteriorly. Osteopenia with degenerative changes along the spine and shoulders. Air-fluid level along the stomach beneath the left hemidiaphragm. Overlapping cardiac leads. IMPRESSION: Small bilateral pleural effusions. Electronically Signed   By: Karen Kays M.D.   On: 03/31/2023  13:10   US Abdomen Limited RUQ (LIVER/GB)  Result Date: 03/31/2023 CLINICAL DATA:  Elevated liver function tests. EXAM: ULTRASOUND ABDOMEN LIMITED RIGHT UPPER QUADRANT COMPARISON:  CT abdomen and pelvis 03/31/2023 FINDINGS: The examination was technically limited due to increased respiratory rate and limited patient mobility. Gallbladder: Gallbladder sludge. No shadowing gallstones. Mild gallbladder wall thickening measuring 4 mm. Pericholecystic fluid in the setting of ascites. No sonographic Murphy sign noted by sonographer. Common bile duct: Diameter: 4 mm Liver: Diffusely echogenic and coarsened liver parenchyma without a mass identified. Mildly nodular liver contour. Portal vein is patent on color Doppler imaging with bidirectional flow. Other: Ascites. IMPRESSION: 1. Hepatic steatosis with mildly nodular liver contour which could indicate cirrhosis with bidirectional portal vein flow. 2. Gallbladder sludge and mild gallbladder wall thickening which may be related to liver disease. No gallstones or sonographic Murphy sign to strongly suggest acute cholecystitis. 3. Ascites. Electronically Signed   By: Sebastian Ache M.D.   On: 03/31/2023 08:35   CT ABDOMEN PELVIS W CONTRAST  Result Date: 03/31/2023 CLINICAL DATA:  Mid to lower abdominal pain for 1 month, worsening in the last few days. Nausea, vomiting and constipation. EXAM: CT ABDOMEN AND PELVIS WITH CONTRAST TECHNIQUE: Multidetector CT imaging of the abdomen and pelvis was performed using the standard protocol following bolus administration of intravenous contrast. RADIATION DOSE REDUCTION: This exam was performed according  to the departmental dose-optimization program which includes automated exposure control, adjustment of the mA and/or kV according to patient size and/or use of iterative reconstruction technique. CONTRAST:  75mL OMNIPAQUE IOHEXOL 300 MG/ML  SOLN COMPARISON:  02/26/2019, 03/05/2019. FINDINGS: Lower chest: There is a small left  pleural effusion. Bronchial wall thickening is noted bilaterally with atelectasis or scarring at the lung bases. There is a stable 4 mm nodule in the right middle lobe, unchanged from 2020 and likely benign. Hepatobiliary: Hepatic steatosis is noted with focal fatty sparing at the gallbladder fossa and left lobe of the liver. No biliary ductal dilatation. The gallbladder is without stones. Pancreas: Unremarkable. No pancreatic ductal dilatation or surrounding inflammatory changes. Spleen: Normal in size without focal abnormality. Adrenals/Urinary Tract: The adrenal glands are within normal limits. The kidneys enhance symmetrically. No renal calculus or hydronephrosis. The bladder is unremarkable. A stable cystic structure is noted inferior to the urinary bladder, possible urethral diverticulum versus nabothian cyst Stomach/Bowel: There is thickening of the walls of the distal esophagus and stomach with mucosal enhancement. No bowel obstruction, free air, or pneumatosis. No free air or pneumatosis is seen. There is inferior subluxation of the rectum below the pubococcygeal line compatible with pelvic floor dysfunction. Increased density is noted in the perirectal space bilaterally, greater on the right than on the left. No abscess is seen. There is evidence of partial colectomy with anastomotic site at the rectosigmoid colon. The appendix is not seen. There is rectal wall thickening involving the ascending colon and cecum. Vascular/Lymphatic: Aortic atherosclerosis. No enlarged abdominal or pelvic lymph nodes. Reproductive: Uterus and bilateral adnexa are unremarkable. Other: There is moderate-to-large ascites.  Anasarca is noted. Musculoskeletal: Degenerative changes are noted in the thoracolumbar spine. No acute osseous abnormality. IMPRESSION: 1. Thickening of the walls of the cecum and ascending colon, possible infectious or inflammatory colitis. 2. Mucosal enhancement with thickening of the walls of the distal  esophagus and gastric folds suggesting gastritis/esophagitis. 3. Inferior displacement of the rectum below the pubococcygeal line suggesting pelvic floor dysfunction. There is mild perirectal fat stranding extending into the soft tissues in the perineum, greater on the right than on the left. No abscess is seen. 4. Moderate-to-large ascites and anasarca. 5. Hepatic steatosis. 6. Aortic atherosclerosis. 7. Remaining incidental findings as described above. Electronically Signed   By: Thornell Sartorius M.D.   On: 03/31/2023 04:27    GI Procedures and Studies  No new relevant studies to review   ASSESSMENT  Ms. Amodei is a 57 y.o. female with a pmh significant for multiple sclerosis, COPD, hypertension, alcohol use disorder, GERD, diverticulosis and prior diverticulitis (status post previous resection), colon polyps.  Patient admitted with abdominal pain and progressive LFT abnormalities concerning for acute on chronic liver disease with alcoholic hepatitis (other etiologies being evaluated as well) and negative for SBP on tap.  The patient is hemodynamically stable at this time.  Not clear why her synthetic function with her significant elevation in INR has occurred.  With that, concerning is the fact that her Madrey's discriminant function has elevated significantly from yesterday into today.  We are going to give her IV vitamin K.  She will need blood cultures but as everything so far has come back negative for infection, I think it is reasonable to give her an opportunity with prednisolone therapy versus IV Solu-Medrol therapy for couple days to ensure it is getting into her system to see if we can try to reverse what looks to be alcoholic hepatitis  occurring currently.  Will have to be mindful with her significant leukocytosis and her lactic acidemia that is not clearing at this time.  As she has not had diarrhea I am not as concerned about the potential inflammation or query colitis that was noted on the CT  scan for now.  Certainly if she develops diarrhea then we need to culture that and make sure she does not have C. difficile or another infectious etiology but that has not been her presentation initially.  We will hold on diuretic therapy for now and see how she is doing in the next few days.  I have allowed the patient to advance her diet and she is going to try to do that today and also have protein shakes administered.  She will eventually need a dietary consultation for low-sodium diet.   PLAN/RECOMMENDATIONS  Volume -Holding on diuretics for now -Will consider diuretic therapy pending patient's clinical course -Obtain daily standing weight (order placed) -1500 mg Na diet (consider dietitian consultation when able) Infection -No evidence of SBP and with her kidney function not required for SBP prophylaxis currently Bleeding -Will need EGD at some point during this hospitalization Encephalopathy -Currently none Screening -CT from this admission shows no evidence of HCC -I will add an AFP level Transplant -Significant alcohol use disorder for years never told to stop per her report if she progresses or worsens may need to have a formal discussion with transplant center though suspect she will not be a candidate Vaccination -Checking HAV and HBV immunity  Other -Obtain daily MELD Labs -Please give 10 mg IV Vitamin K x3 days -Promote intake of 1.5 g/kg/day of Protein (Ensures/Boosts at each meal) -Obtain blood cultures -Initiate prednisolone 40 mg daily versus IV Solu-Medrol and monitor closely -Repeat lactic acid at 1 PM -ESR/CRP added on -Advance diet as tolerated   Please page/call with questions or concerns.   Christina Parish, MD [facility] Office # [phone]    LOS: 1 day  Lemar Lofty  04/01/2023, 2:11 PM

## 2023-04-01 NOTE — Progress Notes (Signed)
Pharmacy Antibiotic Note  Christina Miller is a 57 y.o. female admitted on 04/05/2023 with liver failure, cirrhosis.  She was started on Rocephin + Flagyl, however due to patient being hypotensive, hypothermic antibiotics are being broadened for potential hospital acquired pathogens.   Pharmacy has been consulted for Cefepime + Vancomycin dosing.  Plan: Cefepime 2gm IV q12h Vancomycin 750mg  IV q48h to target AUC 400-550.   Estimated AUC on this regimen = 473. Monitor renal function and cx data    Height: 5\' 1"  (154.9 cm) Weight: 31 kg (68 lb 5.5 oz) IBW/kg (Calculated) : 47.8  Temp (24hrs), Avg:97.5 F (36.4 C), Min:97.4 F (36.3 C), Max:97.9 F (36.6 C)  Recent Labs  Lab 03/31/23 0019 03/31/23 0311 03/31/23 0800 03/31/23 1619 03/31/23 2004 04/01/23 0046  WBC 21.7*  --   --   --   --   --   CREATININE 0.80  --  0.65 0.55 0.70 0.76  LATICACIDVEN >9.0* 8.6*  --   --  >9.0*  --     Estimated Creatinine Clearance: 37.5 mL/min (by C-G formula based on SCr of 0.76 mg/dL).    Allergies  Allergen Reactions   Hydrocodone Nausea And Vomiting   Bactrim [Sulfamethoxazole-Trimethoprim] Rash    Antimicrobials this admission: 9/13 Rocephin >>9/14 9/13 Flagyl >> 9/14 Cefepime >>  9/14 Vancomycin >>   Dose adjustments this admission:  Microbiology results: 9/13 BCx: NGTD 9/13 UCx:   9/13 Peritoneal Fluid: NGTD 9/13 MRSA PCR: negative  Thank you for allowing pharmacy to be a part of this patient's care.  Junita Push PharmD 04/01/2023 2:53 AM

## 2023-04-01 NOTE — Assessment & Plan Note (Signed)
-   See abdominal pain as well - UA suggestive of infection.  Urine culture growing E. coli.  Reviewed sens - Continue Rocephin

## 2023-04-01 NOTE — Assessment & Plan Note (Signed)
-   Na 118 on admission; asymptomatic; some chronic component expected in setting of alcohol use and presumed cirrhosis - suspected combo of alcohol abuse and poor intake - s/p IVF on admission and has improved and remaining stable  - continue trending -Appetite and mentation slowly improving

## 2023-04-01 NOTE — Assessment & Plan Note (Signed)
-   See abnormal CT

## 2023-04-01 NOTE — Assessment & Plan Note (Signed)
-   PT uptrending 16.3 >> 28.5 today and INR as well 1.3 >> 2.7 - MELD-Na = 32 points as of 9/14 (~65% 90 day mortality) - giving 10 mg IV Vit K x 1 - trend INR -currently is alert and able to carry on conversation; not appreciating much confusion yet - NH3 mildly elevated 44 on admission

## 2023-04-01 NOTE — Progress Notes (Signed)
Patient Name: Christina Miller           DOB: 04-19-1966  MRN: [id number]      Admission Date: 04/12/23  Attending Provider: Maryln Gottron, MD  Primary Diagnosis: Subacute liver failure   Level of care: Stepdown    CROSS COVER NOTE   Date of Service   04/01/2023   Christina Miller, 57 y.o. female, was admitted on 2023-04-12 for Subacute liver failure.    HPI/Events of Note   Hypotension, 76/52 (59)  Asymptomatic.  Afebrile with a WBC 22.  All other vital signs stable.  Remains on room air.   Given abdominal presentation, leukocytosis, soft BP on admission, patient was started on Rocephin and Flagyl for possible SBP.  Noted to have lactic acidosis due to liver disease vs infection? Paracentesis was completed earlier today, 1 L yellow fluid removed from left lower quadrant. Will add on fluid bolus, albumin.  Previous serum albumin was 2.3.  At bedside, patient is A/O x 4.  No acute distress noted. Breath sounds clear, diminished at bases.  Respiratory effort normal.  Denies SOB Abdomen appears benign, denies pain or discomfort at this time.  Bowel sounds present.   Addendum 0130-  Some BP improvement after fluid bolus, BP 84/65 (69).  Added on midodrine, 500 cc bolus, morning labs, broaden antibiotics.   0545-  SBP 80s with MAP 61.  Additional fluid and midodrine added this morning. Critical hemoglobin 5.4 was called in to nursing staff, previous hemoglobin 8.5.  STAT redraw.  Type and screen also ordered.   0715-BP improvement after interventions. Hgb 5.2, could be possibly hemodiluted. Ordered 1 unit prbc transfusion.    Interventions/ Plan   Fluid bolus, 2L total IV albumin Midodrine, 15 mg total Broaden antibiotics Day team to follow-up on CBC-hemoglobin level.        Anthoney Harada, DNP, ACNPC- AG [facility]

## 2023-04-01 NOTE — Progress Notes (Signed)
   04/01/23 1721  TOC Brief Assessment  Insurance and Status Reviewed  Patient has primary care physician Yes  Home environment has been reviewed From home  Prior level of function: Independent  Prior/Current Home Services No current home services  Social Determinants of Health Reivew SDOH reviewed no interventions necessary  Readmission risk has been reviewed Yes   CSW visited pt at bedside, son and another visitor present.  Pt appeared older than stated age. Was less responsive.  Shared with pt and family that resources were added to her AVS that include education and information.  She was able to nod in agreement that she would review. TOC will engage should additional needs arise.

## 2023-04-01 NOTE — Plan of Care (Signed)
Discussed with patient plan of care for the evening, pain management and medications for the night time with some teach back displayed.  What is important is eating more protein and rest tonight.    Problem: Education: Goal: Knowledge of General Education information will improve Description: Including pain rating scale, medication(s)/side effects and non-pharmacologic comfort measures 04/01/2023 2006 by Miki Kins, RN Outcome: Not Progressing   Problem: Health Behavior/Discharge Planning: Goal: Ability to manage health-related needs will improve Outcome: Not Progressing

## 2023-04-01 NOTE — Assessment & Plan Note (Signed)
-   lactic elevated in setting of liver failure more than due to sepsis - accept MAP>60 in setting of liver disease  - s/p IVF; will get further volume expansion with PRBC

## 2023-04-01 NOTE — Assessment & Plan Note (Signed)
-   MDF 32 on admission and now up to 88 pts on calculation today - starting steroids; discussed with GI - high mortality overall in her clinical context; I've also told patient this explicitly

## 2023-04-01 NOTE — Hospital Course (Addendum)
Ms. Gerl is a 57 yo female with PMH alcohol abuse, suspected cirrhosis, hepatic steatosis, HTN, GERD, COPD who presented with abdominal fullness, intermittent fevers but no chills. She was having some N/V too. She reported a 12 pack beer daily on admission but also reports liquor use with further questioning too.  She was initially started on antibiotics due to concern for infection as well. Lab workup was consistent with alcoholic hepatitis after admission and she was initiated on steroids.  GI was also consulted on admission. Paracentesis was performed on admission removing 1 L fluid which was negative for SBP on cell count.  GI is following and has her on IV steroids and has not changed her to p.o. prednisone. Lille to be checked tomorrow.  Today she had some bloody stool in her rectum.  GI recommending repeating CBC and if continues to have some considering a CTA and have now stopped her lactulose.  **Patient decompensated today and had profuse brisk Lower GI Bleeding. GI at bedside and patient undergoing Emergent Blood Transfusions and Transferred to ICU and PCCM consulted. PCCM placed Central Access and given that the patient is hypotensive she is undergoing Massive Transfusion Protocol and on Pressors. IR consulted for Mesenteric Angiogram and Embolization and now the patient is status post superior mesenteric angiogram, superior rectal arteriogram, Gelfoam embolization of the distal superior rectal artery near her colonic anastomosis at the site of her hyperemia with no active extravasation and bilateral pelvic angiogram.  Assessment and Plan:  Hemorrhagic Shock in the setting of lower GI bleeding -Patient decompensated and profusely bleeding from the Lower GI tract -Yesterday she was having some intermittent painless lower GI bleeding which was very scant but now she significantly bled and became hypotensive with unclear etiology -Had a rapid response called and GI was at bedside when I came  in and she is status post vitamin K and placed on a Protonix drip -Given a liter bolus and placed on IV PPI -She had a stat CTA was ordered however given her hemodynamic instability she was transferred to the ICU -She is status post massive transfusion protocol and now placed on pressors and interventional radiology is following. -Critical care to assume primary given her decompensation  Subacute Liver Failure --MELD-Na = 32 points as of 9/14 (~65% 90 day mortality) -Continue Vit K 10 mg daily x 3 days per GI rec's and now getting it again  -Continue trending PT/INR and is now 16.9/1.4 -Very sensitive to opoids and benzos causing decreased mentation/lethargy etc, need to be judicious with use; starting to develop some signs of etoh w/d on 9/16 -Ammonia was elevated but now lactulose is being discontinued given below   Alcoholic Hepatitis with Ascites Hyperbilirubinemia - MDF 32 on admission and now up to 88 pts on calculation 9/14 -Continue solu-medrol; waxing/waning PO intake at times; mentation finally more consistently improved; agree with changing to oral steroid 9/18 per GI -LFT Trend and Bilirubin Trend: Recent Labs  Lab 04/01/23 0614 04/02/23 0256 04/03/23 0259 04/04/23 0425 04/05/23 0430 04/06/23 0510 03/31/2023 0434  AST 731* 560* 370* 187* 114* 89* 75*  ALT 144* 156* 144* 117* 96* 85* 74*   Recent Labs  Lab 04/01/23 0614 04/02/23 0256 04/03/23 0259 04/04/23 0425 04/05/23 0430 04/06/23 0510 04/06/2023 0434  BILITOT 13.0* 14.5* 11.8* 7.4* 5.9* 5.9* 5.2*  -High mortality overall in her clinical context; have discussed prognosis with patient and family -Lile score done today  -Today is Day 7 of Steroids for Alcoholic Hepatitis and now  changed to prednisolone 40 mg daily   Acute Respiratory Failure with Hypoxia (HCC) -Increased oral secretions noted 9/15 and worsening mentation requiring oral suctioning; at risk for aspiration. Also s/p IVF on admission and PRBC in  setting of hypoalbuminemia so 3rd spacing a risk as well -CXR repeated 9/15 shows worsening L effusion as expected; no signs of aspiration at this time yet SpO2: 99 % O2 Flow Rate (L/min): 15 L/min FiO2 (%): 100 % -Holding off on Lasix for now given concern for intravascular depletion at this time and may worsen renal function and she is not an HD candidate  -If develops fever will add flagyl on with Rocephin for aspiration coverage; trending WBC further still (remains elevated but improved from admission)   Alcohol Abuse -continue CIWA protocol; caution with ativan unless unless absolutely needed -last drink 9/13 -drinks reportedly 12 pack beer daily and liquor use possibly up to 1/2 pint daily -ETOH level 158 on admission    Acute Hepatic Encephalopathy (HCC) -See liver failure also -Some of mentation worsening may have been due to ativan use during some w/d symptoms; overall mentation has cleared; no further concern for asterixis also -NH3 has downtrended -Lactulose is being stopped as below   Hyponatremia -Na 118 on admission; asymptomatic; some chronic component expected in setting of alcohol use and presumed cirrhosis -Suspected combo of alcohol abuse and poor intake -s/p IVF on admission and has improved and remaining stable  -Na+ Trend: Recent Labs  Lab 04/02/23 0256 04/03/23 0259 04/04/23 0425 04/05/23 0430 04/06/23 0510 2023/05/06 0434 2023/05/06 1220  NA 125* 127* 127* 128* 129* 128* 132*  -Continue Trending -Appetite and mentation slowly improving   Acute Cystitis -See abdominal pain as well -UA suggestive of infection.  Urine culture growing E. coli.  Reviewed sens -She is completed IV ceftriaxone for 5 days with her last day being 04/05/2023   Abdominal Pain, Generalized -Suspect from small body habitus and distension from ascites -S/p paracentesis on 03/31/2023 removing 1 L fluid -Fluid studies negative for SBP -initial WBC 21.7 and downtrend and now trend  shows: Recent Labs  Lab 04/02/23 0256 04/03/23 0259 04/04/23 0425 04/05/23 0430 04/06/23 0510 04/06/23 1735 May 06, 2023 0434  WBC 13.3* 12.3* 12.7* 13.3* 14.6* 12.6* 14.0*  -Blood cultures in process -UA possibly consistent with UTI -De-escalate abx back down to Rocephin and will cover urine for now; no other obvious source infection at this time; urine culture growing E. coli, sensitivities reviewed  Metabolic Acidosis -Improved as she has a CO2 of 24, anion gap of 7, chloride level of 98.   Macrocytic Anemia with ABLA and Hemorrhagic Shock; As above -Presumed bone marrow suppression from excessive alcohol abuse -Folate also low, 2.8 -B12 adequate, 868 -Continue folic acid and multivitamin -Hgb/Hct Trend: Recent Labs  Lab 04/03/23 0259 04/04/23 0425 04/05/23 0430 04/06/23 0510 04/06/23 1735 05-06-23 0434 05-06-2023 1220  HGB 12.6 11.3* 11.3* 12.2 11.7* 10.7* 8.3*  HCT 37.0 33.8* 34.6* 36.7 36.9 33.2* 26.6*  MCV 101.4* 103.0* 103.6* 101.9* 106.0* 106.4*  --   -Hgb down from 8.5 g/dL on admission to 5.2 g/dL on 0/98.  Possibly hemodilution as received fluid resuscitation since admission due to hypotension.  FOBT negative -Transfused 2 units PRBC on 9/14 and now Hgb 11.6 g/dL yesterday (question the validity of the 5.2 g/dL on 1/19, may have been lab error in retrospect) -Had rusty colored watery output from her rectum on 04/06/23 and GI performed a rectal exam which revealed rusty colored output.  GI  recommending repeat CBC and rectal output within an hour if she has a large amount regular recommend considering CTA but there are stopping her lactulose -See Above**   Abnormal CT scan, colon - CT notes thickened walls of cecum and ascending colon along with mucosal enhancement and thickening of walls of distal esophagus and gastric folds -Findings may be in setting of inflammatory context -GI following as well.  She may at some point need further endoscopy evaluation when more  stable    Hypotension -Lactic elevated in setting of liver failure more than due to sepsis but now she has Shock as above -Accept MAP>60 in setting of liver disease  -S/p IVF and get further volume expansion with PRBC  Thrombocytopenia -Platelet Count Trend: Recent Labs  Lab 04/02/23 0256 04/03/23 0259 04/04/23 0425 04/05/23 0430 04/06/23 0510 04/06/23 1735 03/19/2023 0434  PLT 128* 100* 68* 51* 53* 60* 55*  -Continue to Monitor and Trend Platelet Count Trend and repeat CBC in the AM    Esophageal Thickening -See abnormal CT   Multiple sclerosis (HCC) -Not on treatment   Hypoalbuminemia -Patient's Albumin Trend: Recent Labs  Lab 04/01/23 0614 04/02/23 0256 04/03/23 0259 04/04/23 0425 04/05/23 0430 04/06/23 0510 04/13/2023 0434  ALBUMIN 2.9* 2.7* 2.5* 2.1* 2.0* 2.2* 1.9*  -Continue to Monitor and Trend and repeat CMP in the AM

## 2023-04-01 NOTE — Progress Notes (Signed)
   04/01/23 1545  Spiritual Encounters  Type of Visit Follow up  Care provided to: Pt and family  Conversation partners present during encounter Nurse  Reason for visit Routine spiritual support  OnCall Visit Yes   Chaplain stopped to visit with patient during rounding. The patient Christina Miller was alert and feeling a little better today. Family was with her and enjoying the time together.   Valerie Roys [facility]  [phone]

## 2023-04-01 NOTE — Assessment & Plan Note (Signed)
-   suspect from small body habitus and distension from ascites - s/p paracentesis on 03/31/2023 removing 1 L fluid - Fluid studies negative for SBP - initial WBC 21.7 and downtrended - blood cultures in process - UA possibly consistent with UTI - de-escalate abx back down to Rocephin and will cover urine for now; no other obvious source infection at this time; urine culture growing E. coli, sensitivities reviewed

## 2023-04-01 NOTE — Progress Notes (Addendum)
Progress Note    Christina Miller   BMW:[id number]  DOB: May 29, 1966  DOA: 04/13/2023     1 PCP: Christina Matar, MD  Initial CC: Abdominal distention  Hospital Course: [name] is a 57 yo female with PMH alcohol abuse, suspected cirrhosis, hepatic steatosis, HTN, GERD, COPD who presented with abdominal fullness, intermittent fevers but no chills. She was having some N/V too. She reported a 12 pack beer daily on admission but also reports liquor use with further questioning too.  She was initially started on antibiotics due to concern for infection as well. Lab workup was consistent with alcoholic hepatitis after admission and she was initiated on steroids.  GI was also consulted on admission. Paracentesis was performed on admission removing 1 L fluid which was negative for SBP on cell count.  Interval History:  Seen this morning in the ICU resting comfortably.  Very soft-spoken but is alert and able to answer questions easily.  Last drink was Friday. She denies wanting to drink to the point of killing herself.  Discussed with her the high mortality in setting of her clinical presentation. She wishes to detox from alcohol and at this time stating she did not wish to resume alcohol use.  Assessment and Plan: * Subacute liver failure - PT uptrending 16.3 >> 28.5 today and INR as well 1.3 >> 2.7 - MELD-Na = 32 points as of 9/14 (~65% 90 day mortality) - giving 10 mg IV Vit K x 1 - trend INR -currently is alert and able to carry on conversation; not appreciating much confusion yet - NH3 mildly elevated 44 on admission   Alcoholic hepatitis with ascites - MDF 32 on admission and now up to 88 pts on calculation today - starting steroids; discussed with GI - high mortality overall in her clinical context; I've also told patient this explicitly   Alcohol abuse - continue CIWA protocol - last drink 9/13 - drinks reportedly 12 pack beer daily and liquor use possibly up to 1/2  pint daily - etoh level 158 on admission   Hyponatremia - Na 118 on admission; asymptomatic - suspected combo of alcohol abuse and poor intake - s/p IVF and improved to 123 this morning - continue trending; diet advanced to low sodium  Acute cystitis - See abdominal pain as well - UA suggestive of infection.  Urine culture growing E. coli.  Follow-up sensitivities - Continue Rocephin  Abdominal pain, generalized - suspect from small body habitus and distension from ascites - s/p paracentesis on 03/31/2023 removing 1 L fluid - Fluid studies negative for SBP - CXR clear - initial WBC 21.7 and down to 13.2 today - blood cultures in process - UA possibly consistent with UTI - de-escalate abx back down to Rocephin and will cover urine for now; no other obvious source infection at this time   Macrocytic anemia - Presumed bone marrow suppression from excessive alcohol abuse - Folate also low, 2.8 - B12 adequate, 868 -Continue folic acid and multivitamin  -Hgb down to 5.2 g/dL this morning.  Possibly hemodilution as received fluid resuscitation since admission due to hypotension.  FOBT negative -Transfusing 2 units PRBC -Trend H/H  Hypotension - lactic elevated in setting of liver failure more than due to sepsis - accept MAP>60 in setting of liver disease  - s/p IVF; will get further volume expansion with PRBC  Abnormal CT scan, colon - CT notes thickened walls of cecum and ascending colon along with mucosal enhancement and thickening of  walls of distal esophagus and gastric folds -Findings may be in setting of inflammatory context -GI following as well.  She may at some point need further endoscopy evaluation when more stable  Esophageal thickening - See abnormal CT   Old records reviewed in assessment of this patient  Antimicrobials: Vancomycin 04/01/2023 x 1 Cefepime 04/01/2023 x 1 Flagyl 03/31/2023 >> 9/14 Rocephin 03/31/2023 >> current  DVT prophylaxis:  SCDs Start:  03/31/23 1610   Code Status:   Code Status: Full Code  Mobility Assessment (Last 72 Hours)     Mobility Assessment   No documentation.           Barriers to discharge: none Disposition Plan:  Pending clinical course Status is: Inpt  Objective: Blood pressure (!) 152/84, pulse 100, temperature 97.6 F (36.4 C), temperature source Oral, resp. rate 12, height 5\' 1"  (1.549 m), weight 31 kg, last menstrual period 06/20/2012, SpO2 95%.  Examination:  Physical Exam Constitutional:      General: She is not in acute distress.    Comments: Very thin and chronically ill-appearing adult woman who appears older than stated age  HENT:     Head: Normocephalic and atraumatic.     Mouth/Throat:     Mouth: Mucous membranes are dry.  Eyes:     General: Scleral icterus present.     Extraocular Movements: Extraocular movements intact.  Cardiovascular:     Rate and Rhythm: Normal rate and regular rhythm.  Pulmonary:     Effort: Pulmonary effort is normal. No respiratory distress.     Breath sounds: Normal breath sounds. No wheezing.  Abdominal:     General: Bowel sounds are normal. There is distension.     Palpations: Abdomen is soft.     Tenderness: There is no abdominal tenderness.  Musculoskeletal:        General: No swelling. Normal range of motion.     Cervical back: Normal range of motion and neck supple.  Skin:    Coloration: Skin is jaundiced.     Comments: Scattered bruising throughout  Neurological:     Comments: Generalized weakness with possible asterixis but difficult to interpret with significant upper extremity weakness      Consultants:  GI  Procedures:    Data Reviewed: Results for orders placed or performed during the hospital encounter of 04/11/2023 (from the past 24 hour(s))  MRSA Next Gen by PCR, Nasal     Status: None   Collection Time: 03/31/23  2:15 PM   Specimen: Nasal Mucosa; Nasal Swab  Result Value Ref Range   MRSA by PCR Next Gen NOT DETECTED  NOT DETECTED  Lactate dehydrogenase (pleural or peritoneal fluid)     Status: Abnormal   Collection Time: 03/31/23  3:03 PM  Result Value Ref Range   LD, Fluid 43 (H) 3 - 23 U/L   Fluid Type-FLDH CYTO PERI   Body fluid cell count with differential     Status: Abnormal   Collection Time: 03/31/23  3:03 PM  Result Value Ref Range   Fluid Type-FCT CYTO PERI    Color, Fluid YELLOW YELLOW   Appearance, Fluid CLEAR (A) CLEAR   Total Nucleated Cell Count, Fluid 30 0 - 1,000 cu mm   Neutrophil Count, Fluid 22 0 - 25 %   Lymphs, Fluid 16 %   Monocyte-Macrophage-Serous Fluid 62 50 - 90 %   Eos, Fluid 0 %  Albumin, pleural or peritoneal fluid      Status: None   Collection  Time: 03/31/23  3:03 PM  Result Value Ref Range   Albumin, Fluid <1.5 g/dL   Fluid Type-FALB CYTO PERI   Protein, pleural or peritoneal fluid     Status: None   Collection Time: 03/31/23  3:03 PM  Result Value Ref Range   Total protein, fluid <3.0 g/dL   Fluid Type-FTP CYTO PERI   Aerobic/Anaerobic Culture w Gram Stain (surgical/deep wound)     Status: None (Preliminary result)   Collection Time: 03/31/23  3:03 PM   Specimen: PATH Cytology Peritoneal fluid  Result Value Ref Range   Specimen Description      PERITONEAL Performed at [facility], [address] [place], [place] [address]    Special Requests      NONE Performed at [facility], [address] [place], [place] [address]    Gram Stain NO WBC SEEN NO ORGANISMS SEEN     Culture      NO GROWTH < 24 HOURS Performed at [facility], [address] [place], [place] [address]    Report Status PENDING   Basic metabolic panel     Status: Abnormal   Collection Time: 03/31/23  4:19 PM  Result Value Ref Range   Sodium 122 (L) 135 - 145 mmol/L   Potassium 4.7 3.5 - 5.1 mmol/L   Chloride 89 (L) 98 - 111 mmol/L   CO2 11 (L) 22 - 32 mmol/L   Glucose, Bld 35 (LL) 70 - 99 mg/dL   BUN 26 (H) 6 - 20 mg/dL    Creatinine, Ser 5.28 0.44 - 1.00 mg/dL   Calcium 7.1 (L) 8.9 - 10.3 mg/dL   GFR, Estimated >41 >32 mL/min   Anion gap 22 (H) 5 - 15  Glucose, capillary     Status: Abnormal   Collection Time: 03/31/23  5:16 PM  Result Value Ref Range   Glucose-Capillary 22 (LL) 70 - 99 mg/dL  Glucose, capillary     Status: Abnormal   Collection Time: 03/31/23  5:31 PM  Result Value Ref Range   Glucose-Capillary 167 (H) 70 - 99 mg/dL  Basic metabolic panel     Status: Abnormal   Collection Time: 03/31/23  8:04 PM  Result Value Ref Range   Sodium 120 (L) 135 - 145 mmol/L   Potassium 4.7 3.5 - 5.1 mmol/L   Chloride 87 (L) 98 - 111 mmol/L   CO2 13 (L) 22 - 32 mmol/L   Glucose, Bld 122 (H) 70 - 99 mg/dL   BUN 25 (H) 6 - 20 mg/dL   Creatinine, Ser 4.40 0.44 - 1.00 mg/dL   Calcium 7.2 (L) 8.9 - 10.3 mg/dL   GFR, Estimated >10 >27 mL/min   Anion gap 20 (H) 5 - 15  Lactic acid, plasma     Status: Abnormal   Collection Time: 03/31/23  8:04 PM  Result Value Ref Range   Lactic Acid, Venous >9.0 (HH) 0.5 - 1.9 mmol/L  Basic metabolic panel     Status: Abnormal   Collection Time: 04/01/23 12:46 AM  Result Value Ref Range   Sodium 120 (L) 135 - 145 mmol/L   Potassium 4.5 3.5 - 5.1 mmol/L   Chloride 88 (L) 98 - 111 mmol/L   CO2 13 (L) 22 - 32 mmol/L   Glucose, Bld 149 (H) 70 - 99 mg/dL   BUN 28 (H) 6 - 20 mg/dL   Creatinine, Ser 2.53 0.44 - 1.00 mg/dL   Calcium 7.2 (L)  8.9 - 10.3 mg/dL   GFR, Estimated >16 >10 mL/min   Anion gap 19 (H) 5 - 15  Blood gas, venous     Status: Abnormal   Collection Time: 04/01/23 12:46 AM  Result Value Ref Range   pH, Ven 7.28 7.25 - 7.43   pCO2, Ven 32 (L) 44 - 60 mmHg   pO2, Ven 39 32 - 45 mmHg   Bicarbonate 15.2 (L) 20.0 - 28.0 mmol/L   Acid-base deficit 10.6 (H) 0.0 - 2.0 mmol/L   O2 Saturation 60 %   Patient temperature 36.1   Type and screen [facility]     Status: None (Preliminary result)   Collection Time: 04/01/23  6:08 AM  Result Value  Ref Range   ABO/RH(D) A POS    Antibody Screen NEG    Sample Expiration 04/04/2023,2359    Unit Number [id number]    Blood Component Type RED CELLS,LR    Unit division 00    Status of Unit ISSUED    Transfusion Status OK TO TRANSFUSE    Crossmatch Result      Compatible Performed at [facility], [address], [place], [place] [address]    Unit Number [id number]    Blood Component Type RED CELLS,LR    Unit division 00    Status of Unit ISSUED    Transfusion Status OK TO TRANSFUSE    Crossmatch Result Compatible   Basic metabolic panel     Status: Abnormal   Collection Time: 04/01/23  6:14 AM  Result Value Ref Range   Sodium 121 (L) 135 - 145 mmol/L   Potassium 4.4 3.5 - 5.1 mmol/L   Chloride 88 (L) 98 - 111 mmol/L   CO2 19 (L) 22 - 32 mmol/L   Glucose, Bld 117 (H) 70 - 99 mg/dL   BUN 28 (H) 6 - 20 mg/dL   Creatinine, Ser 9.62 0.44 - 1.00 mg/dL   Calcium 7.4 (L) 8.9 - 10.3 mg/dL   GFR, Estimated >95 >28 mL/min   Anion gap 14 5 - 15  Protime-INR     Status: Abnormal   Collection Time: 04/01/23  6:14 AM  Result Value Ref Range   Prothrombin Time 28.5 (H) 11.4 - 15.2 seconds   INR 2.7 (H) 0.8 - 1.2  CBC with Differential/Platelet     Status: Abnormal   Collection Time: 04/01/23  6:14 AM  Result Value Ref Range   WBC 13.2 (H) 4.0 - 10.5 K/uL   RBC 1.39 (L) 3.87 - 5.11 MIL/uL   Hemoglobin 5.2 (LL) 12.0 - 15.0 g/dL   HCT 41.3 (L) 24.4 - 01.0 %   MCV 118.7 (H) 80.0 - 100.0 fL   MCH 37.4 (H) 26.0 - 34.0 pg   MCHC 31.5 30.0 - 36.0 g/dL   RDW 27.2 (H) 53.6 - 64.4 %   Platelets 139 (L) 150 - 400 K/uL   nRBC 0.7 (H) 0.0 - 0.2 %   Neutrophils Relative % 81 %   Neutro Abs 10.7 (H) 1.7 - 7.7 K/uL   Lymphocytes Relative 12 %   Lymphs Abs 1.6 0.7 - 4.0 K/uL   Monocytes Relative 6 %   Monocytes Absolute 0.8 0.1 - 1.0 K/uL   Eosinophils Relative 0 %   Eosinophils Absolute 0.1 0.0 - 0.5 K/uL   Basophils Relative 0 %   Basophils Absolute 0.0 0.0 - 0.1  K/uL   Immature Granulocytes 1 %   Abs Immature Granulocytes 0.12 (H)  0.00 - 0.07 K/uL   Polychromasia PRESENT    Target Cells PRESENT   Hepatic function panel     Status: Abnormal   Collection Time: 04/01/23  6:14 AM  Result Value Ref Range   Total Protein 5.2 (L) 6.5 - 8.1 g/dL   Albumin 2.9 (L) 3.5 - 5.0 g/dL   AST 784 (H) 15 - 41 U/L   ALT 144 (H) 0 - 44 U/L   Alkaline Phosphatase 170 (H) 38 - 126 U/L   Total Bilirubin 13.0 (H) 0.3 - 1.2 mg/dL   Bilirubin, Direct 7.6 (H) 0.0 - 0.2 mg/dL   Indirect Bilirubin 5.4 (H) 0.3 - 0.9 mg/dL  Basic metabolic panel     Status: Abnormal   Collection Time: 04/01/23  7:28 AM  Result Value Ref Range   Sodium 123 (L) 135 - 145 mmol/L   Potassium 4.0 3.5 - 5.1 mmol/L   Chloride 92 (L) 98 - 111 mmol/L   CO2 19 (L) 22 - 32 mmol/L   Glucose, Bld 117 (H) 70 - 99 mg/dL   BUN 27 (H) 6 - 20 mg/dL   Creatinine, Ser 6.96 0.44 - 1.00 mg/dL   Calcium 7.2 (L) 8.9 - 10.3 mg/dL   GFR, Estimated >29 >52 mL/min   Anion gap 12 5 - 15  Prepare RBC (crossmatch)     Status: None   Collection Time: 04/01/23  8:29 AM  Result Value Ref Range   Order Confirmation      ORDER PROCESSED BY BLOOD BANK Performed at [facility], [address], [place], [place] [address]   Sedimentation rate     Status: None   Collection Time: 04/01/23  9:51 AM  Result Value Ref Range   Sed Rate 3 0 - 22 mm/hr    I have reviewed pertinent nursing notes, vitals, labs, and images as necessary. I have ordered labwork to follow up on as indicated.  I have reviewed the last notes from staff over past 24 hours. I have discussed patient's care plan and test results with nursing staff, CM/SW, and other staff as appropriate.   Critical care time to evaluate and treat this patient was 45 minutes.  Independent of separate billable services  This patient is critically ill with the following life-threatening issues requiring my presence at the bedside: Hemodynamic  instability requiring titration of medications Oxygenation/ventilation instability requiring frequent modifications of support Cardiac rhythm disturbances requiring evaluation and/or interventions Fluctuations in neurologic function requiring evaluation and/or interventions and/or fluid/volume titration    LOS: 1 day   Lewie Chamber, MD [facility] 04/01/2023, 1:30 PM

## 2023-04-01 NOTE — Assessment & Plan Note (Signed)
-   continue CIWA protocol; caution with ativan unless unless absolutely needed - last drink 9/13 - drinks reportedly 12 pack beer daily and liquor use possibly up to 1/2 pint daily - etoh level 158 on admission

## 2023-04-02 ENCOUNTER — Inpatient Hospital Stay (HOSPITAL_COMMUNITY): Payer: Medicaid Other

## 2023-04-02 DIAGNOSIS — K7689 Other specified diseases of liver: Secondary | ICD-10-CM | POA: Diagnosis not present

## 2023-04-02 DIAGNOSIS — K7200 Acute and subacute hepatic failure without coma: Secondary | ICD-10-CM | POA: Diagnosis not present

## 2023-04-02 DIAGNOSIS — F1010 Alcohol abuse, uncomplicated: Secondary | ICD-10-CM | POA: Diagnosis not present

## 2023-04-02 DIAGNOSIS — E871 Hypo-osmolality and hyponatremia: Secondary | ICD-10-CM

## 2023-04-02 DIAGNOSIS — J9601 Acute respiratory failure with hypoxia: Secondary | ICD-10-CM | POA: Diagnosis not present

## 2023-04-02 DIAGNOSIS — R400 Somnolence: Secondary | ICD-10-CM

## 2023-04-02 DIAGNOSIS — K7682 Hepatic encephalopathy: Secondary | ICD-10-CM | POA: Diagnosis present

## 2023-04-02 DIAGNOSIS — K7011 Alcoholic hepatitis with ascites: Secondary | ICD-10-CM | POA: Diagnosis not present

## 2023-04-02 DIAGNOSIS — D72828 Other elevated white blood cell count: Secondary | ICD-10-CM | POA: Diagnosis not present

## 2023-04-02 LAB — COMPREHENSIVE METABOLIC PANEL
ALT: 156 U/L — ABNORMAL HIGH (ref 0–44)
AST: 560 U/L — ABNORMAL HIGH (ref 15–41)
Albumin: 2.7 g/dL — ABNORMAL LOW (ref 3.5–5.0)
Alkaline Phosphatase: 201 U/L — ABNORMAL HIGH (ref 38–126)
Anion gap: 11 (ref 5–15)
BUN: 24 mg/dL — ABNORMAL HIGH (ref 6–20)
CO2: 20 mmol/L — ABNORMAL LOW (ref 22–32)
Calcium: 7.7 mg/dL — ABNORMAL LOW (ref 8.9–10.3)
Chloride: 94 mmol/L — ABNORMAL LOW (ref 98–111)
Creatinine, Ser: 0.46 mg/dL (ref 0.44–1.00)
GFR, Estimated: >60 mL/min (ref >60)
Glucose, Bld: 112 mg/dL — ABNORMAL HIGH (ref 70–99)
Potassium: 4.1 mmol/L (ref 3.5–5.1)
Sodium: 125 mmol/L — ABNORMAL LOW (ref 135–145)
Total Bilirubin: 14.5 mg/dL — ABNORMAL HIGH (ref 0.3–1.2)
Total Protein: 5.6 g/dL — ABNORMAL LOW (ref 6.5–8.1)

## 2023-04-02 LAB — GLUCOSE, CAPILLARY
Glucose-Capillary: 96 mg/dL (ref 70–99)
Glucose-Capillary: 77 mg/dL (ref 70–99)
Glucose-Capillary: 101 mg/dL — ABNORMAL HIGH (ref 70–99)
Glucose-Capillary: 147 mg/dL — ABNORMAL HIGH (ref 70–99)

## 2023-04-02 LAB — CBC WITH DIFFERENTIAL/PLATELET
Abs Immature Granulocytes: 0.21 10*3/uL — ABNORMAL HIGH (ref 0.00–0.07)
Basophils Absolute: 0.1 10*3/uL (ref 0.0–0.1)
Basophils Relative: 0 %
Eosinophils Absolute: 0 10*3/uL (ref 0.0–0.5)
Eosinophils Relative: 0 %
HCT: 34.1 % — ABNORMAL LOW (ref 36.0–46.0)
Hemoglobin: 11.6 g/dL — ABNORMAL LOW (ref 12.0–15.0)
Immature Granulocytes: 2 %
Lymphocytes Relative: 7 %
Lymphs Abs: 0.9 10*3/uL (ref 0.7–4.0)
MCH: 33.6 pg (ref 26.0–34.0)
MCHC: 34 g/dL (ref 30.0–36.0)
MCV: 98.8 fL (ref 80.0–100.0)
Monocytes Absolute: 0.7 10*3/uL (ref 0.1–1.0)
Monocytes Relative: 5 %
Neutro Abs: 11.5 10*3/uL — ABNORMAL HIGH (ref 1.7–7.7)
Neutrophils Relative %: 86 %
Platelets: 128 10*3/uL — ABNORMAL LOW (ref 150–400)
RBC: 3.45 MIL/uL — ABNORMAL LOW (ref 3.87–5.11)
RDW: 24 % — ABNORMAL HIGH (ref 11.5–15.5)
WBC: 13.3 10*3/uL — ABNORMAL HIGH (ref 4.0–10.5)
nRBC: 1 % — ABNORMAL HIGH (ref 0.0–0.2)

## 2023-04-02 LAB — URINE CULTURE: Culture: >=100000 — AB

## 2023-04-02 LAB — LACTIC ACID, PLASMA: Lactic Acid, Venous: 1.9 mmol/L (ref 0.5–1.9)

## 2023-04-02 LAB — MAGNESIUM: Magnesium: 1.7 mg/dL (ref 1.7–2.4)

## 2023-04-02 LAB — PROTIME-INR
INR: 2.3 — ABNORMAL HIGH (ref 0.8–1.2)
Prothrombin Time: 25.3 s — ABNORMAL HIGH (ref 11.4–15.2)

## 2023-04-02 MED ORDER — LACTULOSE ENEMA
300.0000 mL | Freq: Two times a day (BID) | ORAL | Status: DC
  Administered 2023-04-02 (×2): 300 mL via RECTAL
  Filled 2023-04-02: qty 300

## 2023-04-02 MED ORDER — ENSURE ENLIVE PO LIQD
237.0000 mL | Freq: Three times a day (TID) | ORAL | Status: DC
  Administered 2023-04-02 – 2023-04-07 (×12): 237 mL via ORAL

## 2023-04-02 MED ORDER — NICOTINE 7 MG/24HR TD PT24
7.0000 mg | MEDICATED_PATCH | Freq: Every day | TRANSDERMAL | Status: DC
  Administered 2023-04-02 – 2023-04-03 (×2): 7 mg via TRANSDERMAL
  Filled 2023-04-02 (×2): qty 1

## 2023-04-02 MED ORDER — NICOTINE 7 MG/24HR TD PT24
7.0000 mg | MEDICATED_PATCH | Freq: Every day | TRANSDERMAL | Status: AC
  Administered 2023-04-02: 7 mg via TRANSDERMAL
  Filled 2023-04-02: qty 1

## 2023-04-02 MED ORDER — CALCIUM GLUCONATE-NACL 2-0.675 GM/100ML-% IV SOLN
2.0000 g | Freq: Once | INTRAVENOUS | Status: AC
  Administered 2023-04-02: 2000 mg via INTRAVENOUS
  Filled 2023-04-02: qty 100

## 2023-04-02 MED ORDER — MAGNESIUM SULFATE 2 GM/50ML IV SOLN
2.0000 g | Freq: Once | INTRAVENOUS | Status: AC
  Administered 2023-04-02: 2 g via INTRAVENOUS
  Filled 2023-04-02: qty 50

## 2023-04-02 MED ORDER — LACTULOSE ENEMA
300.0000 mL | Freq: Once | ORAL | Status: DC
  Filled 2023-04-02: qty 300

## 2023-04-02 NOTE — Assessment & Plan Note (Addendum)
-   increased oral secretions noted 9/15 and worsening mentation requiring oral suctioning; at risk for aspiration. Also s/p IVF on admission and PRBC in setting of hypoalbuminemia so 3rd spacing a risk as well - CXR repeated 9/15 shows worsening L effusion as expected; no signs of aspiration at this time yet - holding off on Lasix for now given concern for intravascular depletion at this time and may worsen renal function and she is not an HD candidate  - if develops fever will add flagyl on with Rocephin for aspiration coverage; trending WBC further still (remains elevated but improved from admission)

## 2023-04-02 NOTE — Progress Notes (Signed)
Progress Note    Christina Miller   DGL:[id number]  DOB: Apr 26, 1966  DOA: Apr 26, 2023     2 PCP: Christina Matar, MD  Initial CC: Abdominal distention  Hospital Course: Christina Miller is a 57 yo female with PMH alcohol abuse, suspected cirrhosis, hepatic steatosis, HTN, GERD, COPD who presented with abdominal fullness, intermittent fevers but no chills. She was having some N/V too. She reported a 12 pack beer daily on admission but also reports liquor use with further questioning too.  She was initially started on antibiotics due to concern for infection as well. Lab workup was consistent with alcoholic hepatitis after admission and she was initiated on steroids.  GI was also consulted on admission. Paracentesis was performed on admission removing 1 L fluid which was negative for SBP on cell count.  Interval History:  Started becoming more lethargic yesterday and this morning she is much more difficult to interact with and has some slight confusion.  Only had 1 dose of lactulose but ammonia level was trending upwards yesterday.  She will get lactulose enema today. Respiratory status also worsening this morning likely in setting of volume overload with third spacing and possibly some aspiration given decreased mentation. Updated significant other in the room this morning regarding her guarded prognosis and also called sister to inform of the same.  Assessment and Plan: * Subacute liver failure - PT uptrended on 9/14. 16.3 >> 28.5 today and INR as well 1.3 >> 2.7 - MELD-Na = 32 points as of 9/14 (~65% 90 day mortality) - continue Vit K 10 mg daily x 3 days per GI rec's - continue trending PT/INR - now developing more lethargy and some confusion; NH3 uptrending 44>>74 - started lactulose on 9/14; will give enema x 1 today and evaluate response  Alcoholic hepatitis with ascites - MDF 32 on admission and now up to 88 pts on calculation 9/14 - continue solu-medrol; becoming more  encephalopathic/lethargic today and unsafe for much PO now - high mortality overall in her clinical context; have discussed prognosis with patient and family  Acute respiratory failure with hypoxia (HCC) - increased oral secretions noted 9/15 and worsening mentation requiring oral suctioning; at risk for aspiration. Also s/p IVF on admission and PRBC in setting of hypoalbuminemia so 3rd spacing a risk as well - CXR repeated 9/15 shows worsening L effusion as expected; no signs of aspiration at this time yet - holding off on Lasix for now given concern for intravascular depletion at this time and may worsen renal function and she is not an HD candidate  - if develops fever will add flagyl on with Rocephin for aspiration coverage; trending WBC further still (remains elevated but improved from admission)  Alcohol abuse - continue CIWA protocol - last drink 9/13 - drinks reportedly 12 pack beer daily and liquor use possibly up to 1/2 pint daily - etoh level 158 on admission   Acute hepatic encephalopathy (HCC) - See liver failure - Continue lactulose - Trend ammonia  Hyponatremia - Na 118 on admission; asymptomatic - suspected combo of alcohol abuse and poor intake - s/p IVF on admission and improved to 125 this morning but now having worsening L effusion as expected from volume and 3rd spacing - continue trending - now not eating due to poor mentation so will need to watch again  Acute cystitis - See abdominal pain as well - UA suggestive of infection.  Urine culture growing E. coli.  Reviewed sens - Continue Rocephin  Abdominal  pain, generalized - suspect from small body habitus and distension from ascites - s/p paracentesis on 03/31/2023 removing 1 L fluid - Fluid studies negative for SBP - initial WBC 21.7 and downtrended - blood cultures in process - UA possibly consistent with UTI - de-escalate abx back down to Rocephin and will cover urine for now; no other obvious source  infection at this time; urine culture growing E. coli, sensitivities reviewed  Macrocytic anemia - Presumed bone marrow suppression from excessive alcohol abuse - Folate also low, 2.8 - B12 adequate, 868 -Continue folic acid and multivitamin  -Hgb down from 8.5 g/dL on admission to 5.2 g/dL on 1/02.  Possibly hemodilution as received fluid resuscitation since admission due to hypotension.  FOBT negative -Transfused 2 units PRBC on 9/14 and now Hgb 11.6 g/dL (question the validity of the 5.2 g/dL on 7/25, may have been lab error in retrospect)  Hypotension - lactic elevated in setting of liver failure more than due to sepsis - accept MAP>60 in setting of liver disease  - s/p IVF; will get further volume expansion with PRBC  Abnormal CT scan, colon - CT notes thickened walls of cecum and ascending colon along with mucosal enhancement and thickening of walls of distal esophagus and gastric folds -Findings may be in setting of inflammatory context -GI following as well.  She may at some point need further endoscopy evaluation when more stable  Esophageal thickening - See abnormal CT   Old records reviewed in assessment of this patient  Antimicrobials: Vancomycin 04/01/2023 x 1 Cefepime 04/01/2023 x 1 Flagyl 03/31/2023 >> 9/14 Rocephin 03/31/2023 >> current  DVT prophylaxis:  SCDs Start: 03/31/23 3664   Code Status:   Code Status: Full Code  Mobility Assessment (Last 72 Hours)     Mobility Assessment   No documentation.           Barriers to discharge: none Disposition Plan:  Pending clinical course Status is: Inpt  Objective: Blood pressure (!) 108/59, pulse 95, temperature 98.1 F (36.7 C), temperature source Oral, resp. rate 11, height 5\' 1"  (1.549 m), weight (S) 49.3 kg, last menstrual period 06/20/2012, SpO2 94%.  Examination:  Physical Exam Constitutional:      General: She is not in acute distress.    Comments: Very thin and chronically ill-appearing adult  woman; more lethargic this morning with some confusion  HENT:     Head: Normocephalic and atraumatic.     Mouth/Throat:     Comments: Copious oral secretions with inability to clear by herself Eyes:     General: Scleral icterus present.     Extraocular Movements: Extraocular movements intact.  Cardiovascular:     Rate and Rhythm: Normal rate and regular rhythm.  Pulmonary:     Effort: Pulmonary effort is normal. No respiratory distress.     Breath sounds: Normal breath sounds. No wheezing.  Abdominal:     General: Bowel sounds are normal. There is distension.     Palpations: Abdomen is soft.     Tenderness: There is no abdominal tenderness.  Musculoskeletal:        General: No swelling. Normal range of motion.     Cervical back: Normal range of motion and neck supple.  Skin:    Coloration: Skin is jaundiced.     Comments: Scattered bruising throughout  Neurological:     Comments: Generalized weakness with possible asterixis but difficult to interpret with significant upper extremity weakness      Consultants:  GI  Procedures:  Data Reviewed: Results for orders placed or performed during the hospital encounter of 2023/04/13 (from the past 24 hour(s))  Lactic acid, plasma     Status: Abnormal   Collection Time: 04/01/23  1:35 PM  Result Value Ref Range   Lactic Acid, Venous 2.5 (HH) 0.5 - 1.9 mmol/L  Prepare RBC (crossmatch)     Status: None   Collection Time: 04/01/23  1:35 PM  Result Value Ref Range   Order Confirmation      ORDER PROCESSED BY BLOOD BANK Performed at [facility], [address], [place], [place] [address]   Hemoglobin and hematocrit, blood     Status: Abnormal   Collection Time: 04/01/23  6:06 PM  Result Value Ref Range   Hemoglobin 10.5 (L) 12.0 - 15.0 g/dL   HCT 60.4 (L) 54.0 - 98.1 %  Ammonia     Status: Abnormal   Collection Time: 04/01/23  6:06 PM  Result Value Ref Range   Ammonia 74 (H) 9 - 35 umol/L  Glucose,  capillary     Status: Abnormal   Collection Time: 04/01/23  9:32 PM  Result Value Ref Range   Glucose-Capillary 136 (H) 70 - 99 mg/dL   Comment 1 Notify RN    Comment 2 Document in Chart   Comprehensive metabolic panel     Status: Abnormal   Collection Time: 04/02/23  2:56 AM  Result Value Ref Range   Sodium 125 (L) 135 - 145 mmol/L   Potassium 4.1 3.5 - 5.1 mmol/L   Chloride 94 (L) 98 - 111 mmol/L   CO2 20 (L) 22 - 32 mmol/L   Glucose, Bld 112 (H) 70 - 99 mg/dL   BUN 24 (H) 6 - 20 mg/dL   Creatinine, Ser 1.91 0.44 - 1.00 mg/dL   Calcium 7.7 (L) 8.9 - 10.3 mg/dL   Total Protein 5.6 (L) 6.5 - 8.1 g/dL   Albumin 2.7 (L) 3.5 - 5.0 g/dL   AST 478 (H) 15 - 41 U/L   ALT 156 (H) 0 - 44 U/L   Alkaline Phosphatase 201 (H) 38 - 126 U/L   Total Bilirubin 14.5 (H) 0.3 - 1.2 mg/dL   GFR, Estimated >29 >56 mL/min   Anion gap 11 5 - 15  Protime-INR     Status: Abnormal   Collection Time: 04/02/23  2:56 AM  Result Value Ref Range   Prothrombin Time 25.3 (H) 11.4 - 15.2 seconds   INR 2.3 (H) 0.8 - 1.2  Lactic acid, plasma     Status: None   Collection Time: 04/02/23  2:56 AM  Result Value Ref Range   Lactic Acid, Venous 1.9 0.5 - 1.9 mmol/L  CBC with Differential/Platelet     Status: Abnormal   Collection Time: 04/02/23  2:56 AM  Result Value Ref Range   WBC 13.3 (H) 4.0 - 10.5 K/uL   RBC 3.45 (L) 3.87 - 5.11 MIL/uL   Hemoglobin 11.6 (L) 12.0 - 15.0 g/dL   HCT 21.3 (L) 08.6 - 57.8 %   MCV 98.8 80.0 - 100.0 fL   MCH 33.6 26.0 - 34.0 pg   MCHC 34.0 30.0 - 36.0 g/dL   RDW 46.9 (H) 62.9 - 52.8 %   Platelets 128 (L) 150 - 400 K/uL   nRBC 1.0 (H) 0.0 - 0.2 %   Neutrophils Relative % 86 %   Neutro Abs 11.5 (H) 1.7 - 7.7 K/uL   Lymphocytes Relative 7 %   Lymphs Abs 0.9  0.7 - 4.0 K/uL   Monocytes Relative 5 %   Monocytes Absolute 0.7 0.1 - 1.0 K/uL   Eosinophils Relative 0 %   Eosinophils Absolute 0.0 0.0 - 0.5 K/uL   Basophils Relative 0 %   Basophils Absolute 0.1 0.0 - 0.1 K/uL    Immature Granulocytes 2 %   Abs Immature Granulocytes 0.21 (H) 0.00 - 0.07 K/uL   Burr Cells PRESENT    Polychromasia PRESENT    Target Cells PRESENT   Magnesium     Status: None   Collection Time: 04/02/23  2:56 AM  Result Value Ref Range   Magnesium 1.7 1.7 - 2.4 mg/dL  Glucose, capillary     Status: None   Collection Time: 04/02/23  8:12 AM  Result Value Ref Range   Glucose-Capillary 96 70 - 99 mg/dL   Comment 1 Notify RN    Comment 2 Document in Chart   Glucose, capillary     Status: None   Collection Time: 04/02/23 11:56 AM  Result Value Ref Range   Glucose-Capillary 77 70 - 99 mg/dL   Comment 1 Notify RN    Comment 2 Document in Chart     I have reviewed pertinent nursing notes, vitals, labs, and images as necessary. I have ordered labwork to follow up on as indicated.  I have reviewed the last notes from staff over past 24 hours. I have discussed patient's care plan and test results with nursing staff, CM/SW, and other staff as appropriate.   Critical care time to evaluate and treat this patient was 45 minutes.  Independent of separate billable services  This patient is critically ill with the following life-threatening issues requiring my presence at the bedside: Hemodynamic instability requiring titration of medications Oxygenation/ventilation instability requiring frequent modifications of support Cardiac rhythm disturbances requiring evaluation and/or interventions Fluctuations in neurologic function requiring evaluation and/or interventions and/or fluid/volume titration    LOS: 2 days   Lewie Chamber, MD [facility] 04/02/2023, 12:00 PM

## 2023-04-02 NOTE — Assessment & Plan Note (Signed)
-   See liver failure - Continue lactulose - Trend ammonia

## 2023-04-02 NOTE — Progress Notes (Signed)
Gastroenterology Inpatient Follow-up Note   PATIENT IDENTIFICATION  Christina Miller is a 57 y.o. female Hospital Day: 4  SUBJECTIVE  The patient's chart has been reviewed.  No documentation of any fevers. The patient's labs have been reviewed.  INR slightly decreased from yesterday with bilirubin still around the same.  Patient had an ammonia level checked yesterday. Patient received a dose of Ativan last night but as of this morning is still very tired and certainly not anywhere where she was yesterday in regards to her mental status. She does state that she is not having severe abdominal pain unless we press on her abdomen. She dozes off quickly at time of my evaluation.   OBJECTIVE  Scheduled Inpatient Medications:   Chlorhexidine Gluconate Cloth  6 each Topical Daily   feeding supplement  237 mL Oral TID BM   folic acid  1 mg Oral Daily   lactulose  20 g Oral TID   lactulose  300 mL Rectal Once   methylPREDNISolone (SOLU-MEDROL) injection  40 mg Intravenous Daily   multivitamin with minerals  1 tablet Oral Daily   nicotine  7 mg Transdermal Daily   pantoprazole (PROTONIX) IV  40 mg Intravenous Q24H   thiamine  100 mg Oral Daily   Or   thiamine  100 mg Intravenous Daily   Continuous Inpatient Infusions:   cefTRIAXone (ROCEPHIN)  IV 2 g (04/02/23 1039)   phytonadione (VITAMIN K) 10 mg in dextrose 5 % 50 mL IVPB Stopped (04/01/23 0932)   PRN Inpatient Medications: albuterol, ibuprofen, LORazepam, ondansetron **OR** ondansetron (ZOFRAN) IV, mouth rinse, oxyCODONE, polyethylene glycol, traZODone   Physical Examination  Temp:  [97.4 F (36.3 C)-99 F (37.2 C)] 98.1 F (36.7 C) (09/15 0820) Pulse Rate:  [81-125] 95 (09/15 1000) Resp:  [7-17] 11 (09/15 1000) BP: (101-154)/(54-89) 108/59 (09/15 1000) SpO2:  [88 %-97 %] 94 % (09/15 1000) Weight:  [49.3 kg] 49.3 kg (09/15 0500) Temp (24hrs), Avg:97.9 F (36.6 C), Min:97.4 F (36.3 C), Max:99 F (37.2 C)  Weight:  (S) 49.3 kg (Need to rezero bed after person in the chair or up to [facility] d/t seizure pads being added) GEN: NAD, resting in bed but very tired, looks chronically ill but is nontoxic PSYCH: Minimally cooperative as she is mostly falling back to sleep EYE: Icteric sclerae ENT: MMM CV: Tachycardic in the low 100s RESP: No audible wheezing GI: NABS, soft, protuberant abdomen, mild TTP generalized throughout (unchanged from yesterday), volitional guarding present, no rebound MSK/EXT: Trace bilateral pedal edema present SKIN: Jaundiced, spider angiomata noted on upper thorax NEURO:  Alert & Oriented x 3 (person/place/situation), somnolent at times, I did not elicit manual or pedal asterixis   Review of Data   Laboratory Studies   Recent Labs  Lab 04/02/23 0256  NA 125*  K 4.1  CL 94*  CO2 20*  BUN 24*  CREATININE 0.46  GLUCOSE 112*  CALCIUM 7.7*  MG 1.7   Recent Labs  Lab 04/02/23 0256  AST 560*  ALT 156*  ALKPHOS 201*    Recent Labs  Lab 03/31/23 0019 04/01/23 0614 04/01/23 1806 04/02/23 0256  WBC 21.7* 13.2*  --  13.3*  HGB 8.5* 5.2*   < > 11.6*  HCT 25.5* 16.5*   < > 34.1*  PLT 272 139*  --  128*   < > = values in this interval not displayed.   Recent Labs  Lab 03/31/23 0019 04/01/23 0614 04/02/23 0256  INR 1.3* 2.7*  2.3*   MELD 3.0: 31 at 04/02/2023  2:56 AM MELD-Na: 32 at 04/02/2023  2:56 AM Calculated from: Serum Creatinine: 0.46 mg/dL (Using min of 1 mg/dL) at 7/82/9562  1:30 AM Serum Sodium: 125 mmol/L at 04/02/2023  2:56 AM Total Bilirubin: 14.5 mg/dL at 8/65/7846  9:62 AM Serum Albumin: 2.7 g/dL at 9/52/8413  2:44 AM INR(ratio): 2.3 at 04/02/2023  2:56 AM Age at listing (hypothetical): 57 years Sex: Female at 04/02/2023  2:56 AM  Imaging Studies  US Paracentesis  Result Date: 03/31/2023 INDICATION: Patient with history of alcohol use disorder, elevated liver function tests, abdominal pain, ascites, fatty liver by imaging, ascites. Request received for  diagnostic and therapeutic paracentesis up to 2 liters. EXAM: ULTRASOUND GUIDED DIAGNOSTIC AND THERAPEUTIC PARACENTESIS MEDICATIONS: 8 ml 1% lidocaine COMPLICATIONS: None immediate. PROCEDURE: Informed written consent was obtained from the patient after a discussion of the risks, benefits and alternatives to treatment. A timeout was performed prior to the initiation of the procedure. Initial ultrasound scanning demonstrates a small amount of ascites within the left lower abdominal quadrant. The left lower abdomen was prepped and draped in the usual sterile fashion. 1% lidocaine was used for local anesthesia. Following this, a 19 gauge, 7-cm, Yueh catheter was introduced. An ultrasound image was saved for documentation purposes. The paracentesis was performed. The catheter was removed and a dressing was applied. The patient tolerated the procedure well without immediate post procedural complication. FINDINGS: A total of approximately 1 liter of yellow fluid was removed. Samples were sent to the laboratory as requested by the clinical team. IMPRESSION: Successful ultrasound-guided paracentesis yielding 1 liter of peritoneal fluid. PLAN: If the patient eventually requires >/=2 paracenteses in a 30 day period, candidacy for formal evaluation by the [facility] will be assessed. Performed by: Artemio Aly Electronically Signed   By: Marliss Coots M.D.   On: 03/31/2023 15:41   DG Chest 2 View  Result Date: 03/31/2023 CLINICAL DATA:  Elevated white blood cell count EXAM: CHEST - 2 VIEW COMPARISON:  X-ray 12/22/2020 FINDINGS: Normal cardiopericardial silhouette. No pneumothorax or consolidation or edema. On lateral view there are small dependent pleural effusions posteriorly. Osteopenia with degenerative changes along the spine and shoulders. Air-fluid level along the stomach beneath the left hemidiaphragm. Overlapping cardiac leads. IMPRESSION: Small bilateral  pleural effusions. Electronically Signed   By: Karen Kays M.D.   On: 03/31/2023 13:10    GI Procedures and Studies  No new relevant studies to review   ASSESSMENT  Christina Miller is a 57 y.o. female with a pmh significant for multiple sclerosis, COPD, hypertension, alcohol use disorder, GERD, diverticulosis and prior diverticulitis (status post previous resection), colon polyps.  Patient admitted with abdominal pain and progressive LFT abnormalities concerning for acute on chronic liver disease with alcoholic hepatitis (other etiologies being evaluated as well) and negative for SBP on tap.  The patient is hemodynamically stable.  Clinically however, it is not clear to me if what we are dealing with this morning upon her evaluation is a result of poor clearance of the Ativan that she received yesterday night for concern of alcohol withdrawal versus if this is the beginnings of portosystemic encephalopathy.  I agree with our medicine service with initiation of oral lactulose and if by this afternoon/evening she is not had the ability to tolerate oral intake of lactulose she needs to begin lactulose enemas and potentially have an NG tube placed for oral lactulose to be given.  She should be  maintained on steroid therapy for alcoholic hepatitis currently.  She will need to continue to be monitored for any signs or symptoms of progressive infection but her leukocytosis has been downtrending and her lactate is beginning to clear.  Her clinical status is guarded/tenuous at this time.  We will continue to follow.   PLAN/RECOMMENDATIONS  Volume -Continue to hold on diuretics today -Obtain daily standing weight (order placed) -not possible today based on her mental status -1500 mg Na diet (consider dietitian consultation when able) Infection -No evidence of SBP and with her kidney function not required for SBP prophylaxis currently Bleeding -Will need EGD at some point during this hospitalization to  evaluate for varices and follow-up esophageal thickening and proximal stomach thickening Encephalopathy -As documented above are not clear if this is PSC development or from Ativan but we need to be mindful and be aggressive as if we could since she does have an elevated ammonia level -Agree with lactulose 2-3 times daily oral for now -If by 4 PM she has not had the ability to tolerate lactulose or is having at least 1-2 bowel movements she will need to begin rectal lactulose twice daily (order in place) Screening -CT from this admission shows no evidence of HCC -AFP level pending Transplant -Significant alcohol use disorder for years never told to stop per her report if she progresses or worsens may need to have a formal discussion with transplant center though suspect she will not be a candidate Vaccination -HAV immune, will need HBV vaccination series in future Other -Obtain daily MELD Labs -Please give 10 mg IV Vitamin K x2 days -Promote intake of 1.5 g/kg/day of Protein (Ensures/Boosts at each meal)-when she is more awake -If she is not improving with her mental status and NG tube placement for lactulose as well as oral nutrition is reasonable -Continue IV Solu-Medrol right now because of concern about how she can take oral prednisone  -Lille score to be calculated on 9/21 -Clinical status is guarded   The inpatient [place] [facility] service will be taken over by Dr. Russella Dar tomorrow.  In the interim, please page/call with questions or concerns.   Corliss Parish, MD [facility] Office # [phone]    LOS: 2 days  Lemar Lofty  04/02/2023, 11:10 AM

## 2023-04-02 NOTE — Progress Notes (Signed)
Patient noted to have a cough and unable to spit out secretions. Frequent oral suction  required.  At this time Oxygen level of 85% on nasal cannula @ 2 liters. Oxygen  increased to 4-6  liters with minimal improvement. Patient denies shortness of breath. Patient placed High flow nasal canula  @ 12 liters Dr. Frederick Peers notified and new orders received.

## 2023-04-02 NOTE — Progress Notes (Signed)
Initial Nutrition Assessment  DOCUMENTATION CODES:   Not applicable  INTERVENTION:   When pt more alert encourage PO intake Change to room service with assist   Ensure Enlive po TID, each supplement provides 350 kcal and 20 grams of protein.  Magic cup BID with meals, each supplement provides 290 kcal and 9 grams of protein   NUTRITION DIAGNOSIS:   Increased nutrient needs related to  (liver failure) as evidenced by estimated needs.  GOAL:   Patient will meet greater than or equal to 90% of their needs  MONITOR:   PO intake, Supplement acceptance  REASON FOR ASSESSMENT:   Consult Assessment of nutrition requirement/status  ASSESSMENT:   Pt with PMH of alcohol abuse, suspected cirrhosis, hepatic steatosis, HTN, GERD, COPD, smoker, reports 12 pack beer and liquor daily admitted with subacute liver failure, and alcoholic hepatitis with ascites.   Pt unable to take meds safely this am, lethargic.  GI following, will need EGD   9/13 - s/p paracentesis with 1 L fluid removed   Medications reviewed and include: folic acid, lactulose, solum-medrol, MVI with minerals, protonix, thiamine   Labs reviewed:  Na 125 AST/ALT 560/156 Ammonia 74 CRP 3.1    NUTRITION - FOCUSED PHYSICAL EXAM:  Deferred until follow up   Diet Order:   Diet Order             Diet Heart Room service appropriate? Yes; Fluid consistency: Thin  Diet effective now                   EDUCATION NEEDS:   Not appropriate for education at this time  Skin:  Skin Assessment: Skin Integrity Issues: Skin Integrity Issues:: Stage I Stage I: L buttocks  Last BM:  9/15 smear  Height:   Ht Readings from Last 1 Encounters:  03/31/23 5\' 1"  (1.549 m)    Weight:   Wt Readings from Last 1 Encounters:  04/02/23 (S) 49.3 kg    BMI:  Body mass index is 20.54 kg/m.  Estimated Nutritional Needs:   Kcal:  1600-1800  Protein:  75-90 grams  Fluid:  >1.6 L/day  Cammy Copa., RD, LDN,  CNSC See AMiON for contact information

## 2023-04-02 NOTE — Plan of Care (Signed)
Dicussed with patient in front of family plan of care for the evening , pain management and eating/drinking with some teach back displayed by family.  What is important is to have drink slow and watch for signs of aspiration.  Problem: Education: Goal: Knowledge of General Education information will improve Description: Including pain rating scale, medication(s)/side effects and non-pharmacologic comfort measures Outcome: Progressing   Problem: Health Behavior/Discharge Planning: Goal: Ability to manage health-related needs will improve Outcome: Not Progressing

## 2023-04-03 DIAGNOSIS — K760 Fatty (change of) liver, not elsewhere classified: Secondary | ICD-10-CM

## 2023-04-03 DIAGNOSIS — F1010 Alcohol abuse, uncomplicated: Secondary | ICD-10-CM | POA: Diagnosis not present

## 2023-04-03 DIAGNOSIS — D539 Nutritional anemia, unspecified: Secondary | ICD-10-CM | POA: Diagnosis not present

## 2023-04-03 DIAGNOSIS — K7011 Alcoholic hepatitis with ascites: Secondary | ICD-10-CM | POA: Diagnosis not present

## 2023-04-03 DIAGNOSIS — K7200 Acute and subacute hepatic failure without coma: Secondary | ICD-10-CM | POA: Diagnosis not present

## 2023-04-03 DIAGNOSIS — J9601 Acute respiratory failure with hypoxia: Secondary | ICD-10-CM | POA: Diagnosis not present

## 2023-04-03 LAB — CBC WITH DIFFERENTIAL/PLATELET
Abs Immature Granulocytes: 0.18 10*3/uL — ABNORMAL HIGH (ref 0.00–0.07)
Basophils Absolute: 0.1 10*3/uL (ref 0.0–0.1)
Basophils Relative: 1 %
Eosinophils Absolute: 0.1 10*3/uL (ref 0.0–0.5)
Eosinophils Relative: 1 %
HCT: 37 % (ref 36.0–46.0)
Hemoglobin: 12.6 g/dL (ref 12.0–15.0)
Immature Granulocytes: 2 %
Lymphocytes Relative: 7 %
Lymphs Abs: 0.9 10*3/uL (ref 0.7–4.0)
MCH: 34.5 pg — ABNORMAL HIGH (ref 26.0–34.0)
MCHC: 34.1 g/dL (ref 30.0–36.0)
MCV: 101.4 fL — ABNORMAL HIGH (ref 80.0–100.0)
Monocytes Absolute: 0.8 10*3/uL (ref 0.1–1.0)
Monocytes Relative: 7 %
Neutro Abs: 10.2 10*3/uL — ABNORMAL HIGH (ref 1.7–7.7)
Neutrophils Relative %: 82 %
Platelets: 100 10*3/uL — ABNORMAL LOW (ref 150–400)
RBC: 3.65 MIL/uL — ABNORMAL LOW (ref 3.87–5.11)
RDW: 24.4 % — ABNORMAL HIGH (ref 11.5–15.5)
WBC: 12.3 10*3/uL — ABNORMAL HIGH (ref 4.0–10.5)
nRBC: 0.3 % — ABNORMAL HIGH (ref 0.0–0.2)

## 2023-04-03 LAB — GLUCOSE, CAPILLARY
Glucose-Capillary: 125 mg/dL — ABNORMAL HIGH (ref 70–99)
Glucose-Capillary: 157 mg/dL — ABNORMAL HIGH (ref 70–99)
Glucose-Capillary: 122 mg/dL — ABNORMAL HIGH (ref 70–99)
Glucose-Capillary: 187 mg/dL — ABNORMAL HIGH (ref 70–99)

## 2023-04-03 LAB — TYPE AND SCREEN
ABO/RH(D): A POS
Antibody Screen: NEGATIVE
Unit division: 0
Unit division: 0

## 2023-04-03 LAB — COMPREHENSIVE METABOLIC PANEL
ALT: 144 U/L — ABNORMAL HIGH (ref 0–44)
AST: 370 U/L — ABNORMAL HIGH (ref 15–41)
Albumin: 2.5 g/dL — ABNORMAL LOW (ref 3.5–5.0)
Alkaline Phosphatase: 203 U/L — ABNORMAL HIGH (ref 38–126)
Anion gap: 10 (ref 5–15)
BUN: 25 mg/dL — ABNORMAL HIGH (ref 6–20)
CO2: 21 mmol/L — ABNORMAL LOW (ref 22–32)
Calcium: 8.2 mg/dL — ABNORMAL LOW (ref 8.9–10.3)
Chloride: 96 mmol/L — ABNORMAL LOW (ref 98–111)
Creatinine, Ser: <0.3 mg/dL — ABNORMAL LOW (ref 0.44–1.00)
Glucose, Bld: 117 mg/dL — ABNORMAL HIGH (ref 70–99)
Potassium: 4.1 mmol/L (ref 3.5–5.1)
Sodium: 127 mmol/L — ABNORMAL LOW (ref 135–145)
Total Bilirubin: 11.8 mg/dL — ABNORMAL HIGH (ref 0.3–1.2)
Total Protein: 5.6 g/dL — ABNORMAL LOW (ref 6.5–8.1)

## 2023-04-03 LAB — BPAM RBC
Blood Product Expiration Date: 202410072359
Blood Product Expiration Date: 202410102359
ISSUE DATE / TIME: 202409141004
ISSUE DATE / TIME: 202409141328
Unit Type and Rh: 6200
Unit Type and Rh: 6200

## 2023-04-03 LAB — TSH: TSH: 2.367 u[IU]/mL (ref 0.350–4.500)

## 2023-04-03 LAB — AMMONIA: Ammonia: 52 umol/L — ABNORMAL HIGH (ref 9–35)

## 2023-04-03 LAB — AFP TUMOR MARKER: AFP, Serum, Tumor Marker: 2.4 ng/mL (ref 0.0–9.2)

## 2023-04-03 LAB — MAGNESIUM: Magnesium: 2 mg/dL (ref 1.7–2.4)

## 2023-04-03 LAB — PROTIME-INR
INR: 1.9 — ABNORMAL HIGH (ref 0.8–1.2)
Prothrombin Time: 22.4 s — ABNORMAL HIGH (ref 11.4–15.2)

## 2023-04-03 MED ORDER — NICOTINE 14 MG/24HR TD PT24
14.0000 mg | MEDICATED_PATCH | Freq: Every day | TRANSDERMAL | Status: DC
  Administered 2023-04-04 – 2023-04-07 (×4): 14 mg via TRANSDERMAL
  Filled 2023-04-03 (×3): qty 1

## 2023-04-03 MED ORDER — LORAZEPAM 1 MG PO TABS: 1.0000 mg | ORAL_TABLET | ORAL | Status: AC | PRN

## 2023-04-03 NOTE — Progress Notes (Addendum)
Progress Note  Primary GI: Dr. Lavon Paganini  LOS: 3 days   Chief Complaint: elevated LFT's   Subjective   Patient lying in bed, her boyfriend is feeding her breakfast. Pt A&Ox3. During our conversation she does note " there are 5 children in the bed with me and I want my own room". Mild discomfort to abdomen with palpation. RN reports last BM this morning with dose of Lactulose. Pt denies chest pain or sob. Does complain of cough. On 7L of oxygen. Ate 50% of her breakfast tolerated well. Does state she has some pyrosis. No nausea or vomiting.    Objective   Vital signs in last 24 hours: Temp:  [97.3 F (36.3 C)-98.2 F (36.8 C)] 97.7 F (36.5 C) (09/16 0800) Pulse Rate:  [89-112] 96 (09/16 0800) Resp:  [11-18] 12 (09/16 0800) BP: (87-143)/(59-91) 113/64 (09/16 0800) SpO2:  [90 %-100 %] 100 % (09/16 0800) Weight:  [50.9 kg] 50.9 kg (09/16 0500) Last BM Date : 04/03/23 Last BM recorded by nurses in past 5 days Stool Type: Type 4 (Like a smooth, soft sausage or snake); Type 5 (Soft blobs with clear-cut edges) (04/03/2023  6:44 AM)  General:   female in no acute distress  Heart:  tachycardic heart rate; no murmurs Pulm: Wheezing noted bilaterally, nonproductive cough, on 7L N/C Abdomen: soft, distended, hypoactive bowel sounds in all quadrants. Nontender without guarding. No organomegaly appreciated, positive fluid wave test Extremities:  +2 bilateral lower extremity edema Neurologic:  Alert and  oriented x4;  some hallucinations noted. Negative asterixis. Psych:  Cooperative.   Intake/Output from previous day: 09/15 0701 - 09/16 0700 In: 925 [P.O.:774; IV Piggyback:151] Out: 425 [Urine:425] Intake/Output this shift: No intake/output data recorded.  Studies/Results: DG CHEST PORT 1 VIEW  Result Date: 04/02/2023 CLINICAL DATA:  Hypoxia. EXAM: PORTABLE CHEST 1 VIEW COMPARISON:  03/31/2023. FINDINGS: Cardiac silhouette normal in size.  No mediastinal or hilar masses. Moderate  left pleural effusion obscures the hemidiaphragm. Probable small right pleural effusion. Lungs are hyperexpanded with prominent interstitial markings, but otherwise clear. No pneumothorax. Skeletal structures are demineralized, grossly intact. IMPRESSION: 1. Moderate left pleural effusion, increased in size from the prior exam. Small right pleural effusion. 2. No evidence of pneumonia and no convincing pulmonary edema. Electronically Signed   By: Amie Portland M.D.   On: 04/02/2023 11:33    Lab Results: Recent Labs    04/01/23 0614 04/01/23 1806 04/02/23 0256 04/03/23 0259  WBC 13.2*  --  13.3* 12.3*  HGB 5.2* 10.5* 11.6* 12.6  HCT 16.5* 31.0* 34.1* 37.0  PLT 139*  --  128* 100*   BMET Recent Labs    04/01/23 0728 04/02/23 0256 04/03/23 0259  NA 123* 125* 127*  K 4.0 4.1 4.1  CL 92* 94* 96*  CO2 19* 20* 21*  GLUCOSE 117* 112* 117*  BUN 27* 24* 25*  CREATININE 0.73 0.46 <0.30*  CALCIUM 7.2* 7.7* 8.2*   LFT Recent Labs    04/01/23 0614 04/02/23 0256 04/03/23 0259  PROT 5.2*   < > 5.6*  ALBUMIN 2.9*   < > 2.5*  AST 731*   < > 370*  ALT 144*   < > 144*  ALKPHOS 170*   < > 203*  BILITOT 13.0*   < > 11.8*  BILIDIR 7.6*  --   --   IBILI 5.4*  --   --    < > = values in this interval not displayed.   PT/INR Recent Labs  04/02/23 0256 04/03/23 0259  LABPROT 25.3* 22.4*  INR 2.3* 1.9*     Scheduled Meds:  Chlorhexidine Gluconate Cloth  6 each Topical Daily   feeding supplement  237 mL Oral TID BM   folic acid  1 mg Oral Daily   lactulose  20 g Oral TID   lactulose  300 mL Rectal BID   methylPREDNISolone (SOLU-MEDROL) injection  40 mg Intravenous Daily   multivitamin with minerals  1 tablet Oral Daily   nicotine  7 mg Transdermal Daily   pantoprazole (PROTONIX) IV  40 mg Intravenous Q24H   thiamine  100 mg Oral Daily   Or   thiamine  100 mg Intravenous Daily   Continuous Infusions:  cefTRIAXone (ROCEPHIN)  IV Stopped (04/02/23 1109)   phytonadione  (VITAMIN K) 10 mg in dextrose 5 % 50 mL IVPB Stopped (04/02/23 1232)     Patient Narrative  57 y.o. year old female with GERD, hypertension, alcohol use disorder, MS (2003),COPD, diverticulosis complicated with perforated sigmoid diverticulitis with complex abscess (2019). Presents presents to the emergency department stating that she has been experiencing diffuse abdominal pain, nausea and vomiting for a month. GI consulted for elevated LFT's.    Impression/Plan:   Elevated LFT's. 57 year old female with significant alcohol use disorder who presented with abd pain and ascites and anasarca and findings of deep jaundice. She has entered this hospital with alcoholic liver disease.  She does have some telangiectasias although her imaging does not show any splenomegaly and she had normal platelet count on admission that has not trended downward to 100 from 272. Treatment for alcoholic hepatitis started on Saturday 9/14 when her discriminant function was recalculated when her INR more than doubled from Friday into Saturday. Sunday 9/15 however she began withdrawing, CIWA protocol initiated. Patient oriented but having hallucinations, most likely not  portosystemic encephalopathy but withdrawal symptoms. Abd U/S shows Portal vein is patent on color Doppler imaging with bidirectional flow. Hepatic steatosis with mildly nodular liver contour which could indicate cirrhosis w/ bidirectional portal vein flow. And ascites. CT Abd/pelvis shows hepatic steatosis.No biliary ductal dilatation. The gallbladder is without stones. Moderate to large ascites. Spleen normal in size.  Paracentesis  (9/13) where 1 L removed and negative for SBP on tap.   Day 3 of steroids, will calculate Lille on day 7 (Sept 20th)  to evaluate if her alcohol hepatitis is responding to therapy. MDF =60   (bili 11.8, PT 22.4, Control time 12).   MELD 3.0: 29 at 04/03/2023,  MELD-Na:29 at 04/03/2023 Albumin 2.3 > 2.9>2.7>2.5         Ammonia  44>74> 52             AFP 2.4 Trending downward :AST 311>370, ALT 109>144, alk Phosp 335>170>201>203  Total Bili 14.5>11.8 PT 16.3>28.5>25.3>22.4   INR 1.3>2.7>2.3>1.9 -Daily INR/ PT -On CIWA protocol -Continue steroid therapy-will calculate Lille on day 7 (Sept 20th)  -Tolerated regular diet, lactulose switched to oral, titrate to 2-3 bowel movements/daily -Continue to monitor neuro status closely.    Abdominal pain- lower abdominal pain for 1 month. Pt reports history of constipation with intermittent loose stools. Last BM this morning. Has not required pain medication since 9/13. CT shows thickening of the walls of the cecum and ascending colon,possible infectious or inflammatory colitis. -pain management per hospitalist. -Lactulose TID daily scheduled, titrate 2-3 bowel movements/day   Leukocytosis. WBC 21.7>13.2>13.3>12.3. Lactic acid >9>8.6>9.0>2.5>1.9. Afebrile. Blood cultures negative. Urine cultures positive Ecoli. On rocephin IV. Paracentesis  negative SBP. Small bilateral pleural effusions on cxray 9/13. -pending cytology   Macrocytosis preceding anemia. Hgb 8.5>10.5>11.6>12.6. MCV 101.4 (macrocytosis). Negative fecal occult blood. PLT 139>128>100. No known history of anemia or blood transfusions. Pt reported 1 episode of black tarry stool 3 weeks ago. No overt bleeding at this time. B12 868. Folate 2.8. Iron panel 90. - replete per protocol   Hyponatremia [phone]. Creat 0.46<0.30. BUN 27>24>25 -continue to monitor   GERD. Patient reports history of GERD and takes OTC Pepcid prn. No EGD. CT abd/pelvis shows Mucosal enhancement with thickening of the walls of the distal esophagus and gastric folds suggesting gastritis/esophagitis. Patient reports GERD symptoms.  -Continue Pantoprazole 40mg  IV -Consider endoscopic evaluation during this hospitalization, optimally would like patient to be more stable neurologically before proceeding.   COPD - on home albuterol, on N/C 7L in  hospital. Not on 02 at baseline.   Hx of tubular adenoma colonic polyps- overdue for colonoscopy by 1 year. Schedule outpt.   Additional co-morbidities include : hypertension - on amlodipine, MS - not on treatment      Christina Miller  04/03/2023, 8:52 AM   Attending Physician Note   I have taken an interval history, reviewed the chart and examined the patient. I performed a substantive portion of this encounter, including complete performance of at least one of the key components, in conjunction with the APP. I agree with the APP's note, impression and recommendations with my edits. My additional impressions and recommendations are as follows.   Alcoholic hepatitis, hepatic steatosis, ascites, possible underlying cirrhosis, alcohol withdrawal. MELD 3.0=29. DF=60. INR improved to 1.9 today. LFTs and t bili improved today. Acute hepatitis panel negative. Solumedrol 40 mg IV qd. CIWA protocol. Lactulose started. Asterixis was not noted today.   Macrocytic anemia. Alcohol related and folate deficient. Replace folate.   GERD, suspected gastritis. Continue pantoprazole 40 mg IV qd. Consider EGD when stabilized.   Hyponatremia   COPD  MS  History of diverticulitis, S/P sigmoidectomy in 2020.   Personal history of adenomatous colon polyps. Elective outpatient colonoscopy with Dr. Lavon Paganini.   Claudette Head, MD [facility] See Loretha Stapler, [facility], for our on call provider

## 2023-04-03 NOTE — Progress Notes (Signed)
Progress Note    Jailine Baumgardner   ZOX:[id number]  DOB: Aug 01, 1965  DOA: Apr 09, 2023     3 PCP: Marcine Matar, MD  Initial CC: Abdominal distention  Hospital Course: Ms. Peyer is a 57 yo female with PMH alcohol abuse, suspected cirrhosis, hepatic steatosis, HTN, GERD, COPD who presented with abdominal fullness, intermittent fevers but no chills. She was having some N/V too. She reported a 12 pack beer daily on admission but also reports liquor use with further questioning too.  She was initially started on antibiotics due to concern for infection as well. Lab workup was consistent with alcoholic hepatitis after admission and she was initiated on steroids.  GI was also consulted on admission. Paracentesis was performed on admission removing 1 L fluid which was negative for SBP on cell count.  Interval History:  No events overnight.  Eating breakfast when seen this morning with significant other bedside.  Mentation has improved since yesterday.  She is sensitive to opioids and benzos. Wanting to go outside to smoke a cigarette. Otherwise, seems to be stabilizing.   Assessment and Plan: * Subacute liver failure - PT uptrended on 9/14. 16.3 >> 28.5 and INR as well 1.3 >> 2.7 - MELD-Na = 32 points as of 9/14 (~65% 90 day mortality) - continue Vit K 10 mg daily x 3 days per GI rec's - continue trending PT/INR - very sensitive to opoids and benzos causing decreased mentation/lethargy etc, need to be judicious with use; starting to develop some signs of etoh w/d on 9/16 - NH3 elevated; continue lactulose; okay for PO for now; if can't drink will go back to enema  Alcoholic hepatitis with ascites - MDF 32 on admission and now up to 88 pts on calculation 9/14 - continue solu-medrol; waxing/waning PO intake at times; safer for IV for now - high mortality overall in her clinical context; have discussed prognosis with patient and family - plan for Lillie score at day 7 mark  Acute  respiratory failure with hypoxia (HCC) - increased oral secretions noted 9/15 and worsening mentation requiring oral suctioning; at risk for aspiration. Also s/p IVF on admission and PRBC in setting of hypoalbuminemia so 3rd spacing a risk as well - CXR repeated 9/15 shows worsening L effusion as expected; no signs of aspiration at this time yet - holding off on Lasix for now given concern for intravascular depletion at this time and may worsen renal function and she is not an HD candidate  - if develops fever will add flagyl on with Rocephin for aspiration coverage; trending WBC further still (remains elevated but improved from admission)  Alcohol abuse - continue CIWA protocol; caution with ativan unless unless absolutely needed - last drink 9/13 - drinks reportedly 12 pack beer daily and liquor use possibly up to 1/2 pint daily - etoh level 158 on admission   Acute hepatic encephalopathy (HCC) - See liver failure - Continue lactulose - Trend ammonia  Hyponatremia - Na 118 on admission; asymptomatic; some chronic component expected in setting of alcohol use and presumed cirrhosis - suspected combo of alcohol abuse and poor intake - s/p IVF on admission and has improved and remaining stable  - continue trending - now not eating due to poor mentation so will need to trend  Acute cystitis - See abdominal pain as well - UA suggestive of infection.  Urine culture growing E. coli.  Reviewed sens - Continue Rocephin  Abdominal pain, generalized - suspect from small body habitus and  distension from ascites - s/p paracentesis on 03/31/2023 removing 1 L fluid - Fluid studies negative for SBP - initial WBC 21.7 and downtrended - blood cultures in process - UA possibly consistent with UTI - de-escalate abx back down to Rocephin and will cover urine for now; no other obvious source infection at this time; urine culture growing E. coli, sensitivities reviewed  Macrocytic anemia - Presumed  bone marrow suppression from excessive alcohol abuse - Folate also low, 2.8 - B12 adequate, 868 -Continue folic acid and multivitamin  -Hgb down from 8.5 g/dL on admission to 5.2 g/dL on 1/61.  Possibly hemodilution as received fluid resuscitation since admission due to hypotension.  FOBT negative -Transfused 2 units PRBC on 9/14 and now Hgb 11.6 g/dL (question the validity of the 5.2 g/dL on 0/96, may have been lab error in retrospect)  Abnormal CT scan, colon - CT notes thickened walls of cecum and ascending colon along with mucosal enhancement and thickening of walls of distal esophagus and gastric folds -Findings may be in setting of inflammatory context -GI following as well.  She may at some point need further endoscopy evaluation when more stable  Hypotension-resolved as of 04/03/2023 - lactic elevated in setting of liver failure more than due to sepsis - accept MAP>60 in setting of liver disease  - s/p IVF; will get further volume expansion with PRBC  Esophageal thickening - See abnormal CT  Multiple sclerosis (HCC) - not on treatment   Old records reviewed in assessment of this patient  Antimicrobials: Vancomycin 04/01/2023 x 1 Cefepime 04/01/2023 x 1 Flagyl 03/31/2023 >> 9/14 Rocephin 03/31/2023 >> current  DVT prophylaxis:  SCDs Start: 03/31/23 0729   Code Status:   Code Status: Full Code  Mobility Assessment (Last 72 Hours)     Mobility Assessment   No documentation.           Barriers to discharge: none Disposition Plan:  Pending clinical course Status is: Inpt  Objective: Blood pressure 123/74, pulse 95, temperature 97.8 F (36.6 C), temperature source Oral, resp. rate 17, height 5\' 1"  (1.549 m), weight 50.9 kg, last menstrual period 06/20/2012, SpO2 91%.  Examination:  Physical Exam Constitutional:      General: She is not in acute distress.    Comments: Very thin and chronically ill-appearing adult woman; improved mentation  HENT:     Head:  Normocephalic and atraumatic.     Mouth/Throat:     Comments: Copious oral secretions with inability to clear by herself Eyes:     General: Scleral icterus present.     Extraocular Movements: Extraocular movements intact.  Cardiovascular:     Rate and Rhythm: Normal rate and regular rhythm.  Pulmonary:     Effort: Pulmonary effort is normal. No respiratory distress.     Breath sounds: Normal breath sounds. No wheezing.  Abdominal:     General: Bowel sounds are normal. There is distension.     Palpations: Abdomen is soft.     Tenderness: There is no abdominal tenderness.  Musculoskeletal:        General: No swelling. Normal range of motion.     Cervical back: Normal range of motion and neck supple.  Skin:    Coloration: Skin is jaundiced.     Comments: Scattered bruising throughout  Neurological:     Comments: Generalized weakness      Consultants:  GI  Procedures:    Data Reviewed: Results for orders placed or performed during the hospital encounter of Apr 27, 2023 (  from the past 24 hour(s))  Glucose, capillary     Status: Abnormal   Collection Time: 04/02/23  5:02 PM  Result Value Ref Range   Glucose-Capillary 101 (H) 70 - 99 mg/dL   Comment 1 Notify RN    Comment 2 Document in Chart   Glucose, capillary     Status: Abnormal   Collection Time: 04/02/23  9:41 PM  Result Value Ref Range   Glucose-Capillary 147 (H) 70 - 99 mg/dL   Comment 1 Notify RN    Comment 2 Document in Chart   TSH     Status: None   Collection Time: 04/03/23  2:59 AM  Result Value Ref Range   TSH 2.367 0.350 - 4.500 uIU/mL  CBC with Differential/Platelet     Status: Abnormal   Collection Time: 04/03/23  2:59 AM  Result Value Ref Range   WBC 12.3 (H) 4.0 - 10.5 K/uL   RBC 3.65 (L) 3.87 - 5.11 MIL/uL   Hemoglobin 12.6 12.0 - 15.0 g/dL   HCT 32.3 55.7 - 32.2 %   MCV 101.4 (H) 80.0 - 100.0 fL   MCH 34.5 (H) 26.0 - 34.0 pg   MCHC 34.1 30.0 - 36.0 g/dL   RDW 02.5 (H) 42.7 - 06.2 %   Platelets  100 (L) 150 - 400 K/uL   nRBC 0.3 (H) 0.0 - 0.2 %   Neutrophils Relative % 82 %   Neutro Abs 10.2 (H) 1.7 - 7.7 K/uL   Lymphocytes Relative 7 %   Lymphs Abs 0.9 0.7 - 4.0 K/uL   Monocytes Relative 7 %   Monocytes Absolute 0.8 0.1 - 1.0 K/uL   Eosinophils Relative 1 %   Eosinophils Absolute 0.1 0.0 - 0.5 K/uL   Basophils Relative 1 %   Basophils Absolute 0.1 0.0 - 0.1 K/uL   Immature Granulocytes 2 %   Abs Immature Granulocytes 0.18 (H) 0.00 - 0.07 K/uL   Burr Cells PRESENT    Polychromasia PRESENT   Magnesium     Status: None   Collection Time: 04/03/23  2:59 AM  Result Value Ref Range   Magnesium 2.0 1.7 - 2.4 mg/dL  Ammonia     Status: Abnormal   Collection Time: 04/03/23  2:59 AM  Result Value Ref Range   Ammonia 52 (H) 9 - 35 umol/L  Comprehensive metabolic panel     Status: Abnormal   Collection Time: 04/03/23  2:59 AM  Result Value Ref Range   Sodium 127 (L) 135 - 145 mmol/L   Potassium 4.1 3.5 - 5.1 mmol/L   Chloride 96 (L) 98 - 111 mmol/L   CO2 21 (L) 22 - 32 mmol/L   Glucose, Bld 117 (H) 70 - 99 mg/dL   BUN 25 (H) 6 - 20 mg/dL   Creatinine, Ser <3.76 (L) 0.44 - 1.00 mg/dL   Calcium 8.2 (L) 8.9 - 10.3 mg/dL   Total Protein 5.6 (L) 6.5 - 8.1 g/dL   Albumin 2.5 (L) 3.5 - 5.0 g/dL   AST 283 (H) 15 - 41 U/L   ALT 144 (H) 0 - 44 U/L   Alkaline Phosphatase 203 (H) 38 - 126 U/L   Total Bilirubin 11.8 (H) 0.3 - 1.2 mg/dL   GFR, Estimated NOT CALCULATED >60 mL/min   Anion gap 10 5 - 15  Protime-INR     Status: Abnormal   Collection Time: 04/03/23  2:59 AM  Result Value Ref Range   Prothrombin Time 22.4 (H) 11.4 - 15.2  seconds   INR 1.9 (H) 0.8 - 1.2  Glucose, capillary     Status: Abnormal   Collection Time: 04/03/23  7:35 AM  Result Value Ref Range   Glucose-Capillary 125 (H) 70 - 99 mg/dL   Comment 1 Notify RN    Comment 2 Document in Chart   Glucose, capillary     Status: Abnormal   Collection Time: 04/03/23 12:10 PM  Result Value Ref Range    Glucose-Capillary 157 (H) 70 - 99 mg/dL   Comment 1 Notify RN    Comment 2 Document in Chart     I have reviewed pertinent nursing notes, vitals, labs, and images as necessary. I have ordered labwork to follow up on as indicated.  I have reviewed the last notes from staff over past 24 hours. I have discussed patient's care plan and test results with nursing staff, CM/SW, and other staff as appropriate.   Critical care time to evaluate and treat this patient was 45 minutes.  Independent of separate billable services  This patient is critically ill with the following life-threatening issues requiring my presence at the bedside: Hemodynamic instability requiring titration of medications Oxygenation/ventilation instability requiring frequent modifications of support Cardiac rhythm disturbances requiring evaluation and/or interventions Fluctuations in neurologic function requiring evaluation and/or interventions and/or fluid/volume titration    LOS: 3 days   Lewie Chamber, MD [facility] 04/03/2023, 3:59 PM

## 2023-04-03 NOTE — Progress Notes (Signed)
Pt transferred to 4W. Report called. Spouse called & updated on transfer.

## 2023-04-03 NOTE — Assessment & Plan Note (Signed)
-  not on treatment  ?

## 2023-04-04 DIAGNOSIS — K7011 Alcoholic hepatitis with ascites: Secondary | ICD-10-CM | POA: Diagnosis not present

## 2023-04-04 DIAGNOSIS — J9601 Acute respiratory failure with hypoxia: Secondary | ICD-10-CM | POA: Diagnosis not present

## 2023-04-04 DIAGNOSIS — F1010 Alcohol abuse, uncomplicated: Secondary | ICD-10-CM | POA: Diagnosis not present

## 2023-04-04 DIAGNOSIS — D539 Nutritional anemia, unspecified: Secondary | ICD-10-CM | POA: Diagnosis not present

## 2023-04-04 DIAGNOSIS — K7200 Acute and subacute hepatic failure without coma: Secondary | ICD-10-CM | POA: Diagnosis not present

## 2023-04-04 DIAGNOSIS — K760 Fatty (change of) liver, not elsewhere classified: Secondary | ICD-10-CM | POA: Diagnosis not present

## 2023-04-04 LAB — COMPREHENSIVE METABOLIC PANEL
ALT: 117 U/L — ABNORMAL HIGH (ref 0–44)
AST: 187 U/L — ABNORMAL HIGH (ref 15–41)
Albumin: 2.1 g/dL — ABNORMAL LOW (ref 3.5–5.0)
Alkaline Phosphatase: 203 U/L — ABNORMAL HIGH (ref 38–126)
Anion gap: 6 (ref 5–15)
BUN: 24 mg/dL — ABNORMAL HIGH (ref 6–20)
CO2: 23 mmol/L (ref 22–32)
Calcium: 7.8 mg/dL — ABNORMAL LOW (ref 8.9–10.3)
Chloride: 98 mmol/L (ref 98–111)
Creatinine, Ser: 0.34 mg/dL — ABNORMAL LOW (ref 0.44–1.00)
GFR, Estimated: >60 mL/min (ref >60)
Glucose, Bld: 125 mg/dL — ABNORMAL HIGH (ref 70–99)
Potassium: 3.6 mmol/L (ref 3.5–5.1)
Sodium: 127 mmol/L — ABNORMAL LOW (ref 135–145)
Total Bilirubin: 7.4 mg/dL — ABNORMAL HIGH (ref 0.3–1.2)
Total Protein: 5 g/dL — ABNORMAL LOW (ref 6.5–8.1)

## 2023-04-04 LAB — PROTIME-INR
INR: 1.6 — ABNORMAL HIGH (ref 0.8–1.2)
Prothrombin Time: 19.1 s — ABNORMAL HIGH (ref 11.4–15.2)

## 2023-04-04 LAB — CBC WITH DIFFERENTIAL/PLATELET
Abs Immature Granulocytes: 0.19 10*3/uL — ABNORMAL HIGH (ref 0.00–0.07)
Basophils Absolute: 0 10*3/uL (ref 0.0–0.1)
Basophils Relative: 0 %
Eosinophils Absolute: 0.1 10*3/uL (ref 0.0–0.5)
Eosinophils Relative: 1 %
HCT: 33.8 % — ABNORMAL LOW (ref 36.0–46.0)
Hemoglobin: 11.3 g/dL — ABNORMAL LOW (ref 12.0–15.0)
Immature Granulocytes: 2 %
Lymphocytes Relative: 7 %
Lymphs Abs: 0.9 10*3/uL (ref 0.7–4.0)
MCH: 34.5 pg — ABNORMAL HIGH (ref 26.0–34.0)
MCHC: 33.4 g/dL (ref 30.0–36.0)
MCV: 103 fL — ABNORMAL HIGH (ref 80.0–100.0)
Monocytes Absolute: 1.3 10*3/uL — ABNORMAL HIGH (ref 0.1–1.0)
Monocytes Relative: 11 %
Neutro Abs: 10.1 10*3/uL — ABNORMAL HIGH (ref 1.7–7.7)
Neutrophils Relative %: 79 %
Platelets: 68 10*3/uL — ABNORMAL LOW (ref 150–400)
RBC: 3.28 MIL/uL — ABNORMAL LOW (ref 3.87–5.11)
RDW: 24 % — ABNORMAL HIGH (ref 11.5–15.5)
WBC: 12.7 10*3/uL — ABNORMAL HIGH (ref 4.0–10.5)
nRBC: 0.2 % (ref 0.0–0.2)

## 2023-04-04 LAB — CYTOLOGY - NON PAP

## 2023-04-04 LAB — GLUCOSE, CAPILLARY
Glucose-Capillary: 104 mg/dL — ABNORMAL HIGH (ref 70–99)
Glucose-Capillary: 131 mg/dL — ABNORMAL HIGH (ref 70–99)
Glucose-Capillary: 152 mg/dL — ABNORMAL HIGH (ref 70–99)
Glucose-Capillary: 185 mg/dL — ABNORMAL HIGH (ref 70–99)

## 2023-04-04 LAB — MAGNESIUM: Magnesium: 2.1 mg/dL (ref 1.7–2.4)

## 2023-04-04 MED ORDER — SACCHAROMYCES BOULARDII 250 MG PO CAPS
250.0000 mg | ORAL_CAPSULE | Freq: Two times a day (BID) | ORAL | Status: AC
  Administered 2023-04-04 – 2023-04-05 (×2): 250 mg via ORAL
  Filled 2023-04-04 (×2): qty 1

## 2023-04-04 MED ORDER — PANTOPRAZOLE SODIUM 40 MG IV SOLR
40.0000 mg | Freq: Two times a day (BID) | INTRAVENOUS | Status: DC
  Administered 2023-04-04 – 2023-04-07 (×6): 40 mg via INTRAVENOUS
  Filled 2023-04-04 (×7): qty 10

## 2023-04-04 NOTE — Progress Notes (Addendum)
Progress Note  Primary GI: Dr. Lavon Paganini  LOS: 4 days   Chief Complaint: elevated LFT's   Subjective   Patient sitting in bed eating breakfast, boyfriend at bedside. She is feeding herself today. No hallucinations today, she only received one dose of Ativan during night shift. Asterixis was not noted today. No nausea or vomiting. Does complain of pyrosis. No fever or chills. Foley catheter in place with yellow urine in drainage bag. RN reports bowel movements throughout shift. On 6L via N/C. Ate 75% of her breakfast and already asking what she can have for lunch. No abdominal pain noted today.   Objective   Vital signs in last 24 hours: Temp:  [97.7 F (36.5 C)-98.7 F (37.1 C)] 97.7 F (36.5 C) (09/17 0519) Pulse Rate:  [94-117] 99 (09/17 0519) Resp:  [11-20] 16 (09/17 0519) BP: (104-124)/(61-92) 115/81 (09/17 0519) SpO2:  [91 %-100 %] 100 % (09/17 0519) Weight:  [48.5 kg] 48.5 kg (09/17 0500) Last BM Date : 04/04/23 Last BM recorded by nurses in past 5 days Stool Type: Type 7 (Liquid consistency with no solid pieces) (04/04/2023  7:30 AM)  General:   female in no acute distress  Heart:  tachycardic heart rate, no murmurs Pulm: diminished lung sounds, wheezing noted upper lobes Abdomen: soft, nondistended, normal bowel sounds in all quadrants. Nontender without guarding. No organomegaly appreciated, positive fluid wave test GU: urinary catheter in place Extremities:  +1 BLE edema Neurologic:  Alert and  oriented x4;  No focal deficits. Asterixis was not noted today.  Psych:  Cooperative. Normal mood and affect.  Intake/Output from previous day: 09/16 0701 - 09/17 0700 In: 487 [P.O.:487] Out: 1500 [Urine:1500] Intake/Output this shift: No intake/output data recorded.  Studies/Results: DG CHEST PORT 1 VIEW  Result Date: 04/02/2023 CLINICAL DATA:  Hypoxia. EXAM: PORTABLE CHEST 1 VIEW COMPARISON:  03/31/2023. FINDINGS: Cardiac silhouette normal in size.  No mediastinal or  hilar masses. Moderate left pleural effusion obscures the hemidiaphragm. Probable small right pleural effusion. Lungs are hyperexpanded with prominent interstitial markings, but otherwise clear. No pneumothorax. Skeletal structures are demineralized, grossly intact. IMPRESSION: 1. Moderate left pleural effusion, increased in size from the prior exam. Small right pleural effusion. 2. No evidence of pneumonia and no convincing pulmonary edema. Electronically Signed   By: Amie Portland M.D.   On: 04/02/2023 11:33    Lab Results: Recent Labs    04/02/23 0256 04/03/23 0259 04/04/23 0425  WBC 13.3* 12.3* 12.7*  HGB 11.6* 12.6 11.3*  HCT 34.1* 37.0 33.8*  PLT 128* 100* 68*   BMET Recent Labs    04/02/23 0256 04/03/23 0259 04/04/23 0425  NA 125* 127* 127*  K 4.1 4.1 3.6  CL 94* 96* 98  CO2 20* 21* 23  GLUCOSE 112* 117* 125*  BUN 24* 25* 24*  CREATININE 0.46 <0.30* 0.34*  CALCIUM 7.7* 8.2* 7.8*   LFT Recent Labs    04/04/23 0425  PROT 5.0*  ALBUMIN 2.1*  AST 187*  ALT 117*  ALKPHOS 203*  BILITOT 7.4*   PT/INR Recent Labs    04/03/23 0259 04/04/23 0425  LABPROT 22.4* 19.1*  INR 1.9* 1.6*     Scheduled Meds:  Chlorhexidine Gluconate Cloth  6 each Topical Daily   feeding supplement  237 mL Oral TID BM   folic acid  1 mg Oral Daily   lactulose  20 g Oral TID   methylPREDNISolone (SOLU-MEDROL) injection  40 mg Intravenous Daily   multivitamin with minerals  1 tablet Oral Daily   nicotine  14 mg Transdermal Daily   pantoprazole (PROTONIX) IV  40 mg Intravenous Q24H   thiamine  100 mg Oral Daily   Or   thiamine  100 mg Intravenous Daily   Continuous Infusions:  cefTRIAXone (ROCEPHIN)  IV Stopped (04/03/23 1103)     Patient Narrative:  57 y.o. year old female with GERD, hypertension, alcohol use disorder, MS (2003),COPD, diverticulosis complicated with perforated sigmoid diverticulitis with complex abscess (2019). Presents presents to the emergency department  stating that she has been experiencing diffuse abdominal pain, nausea and vomiting for a month. GI consulted for elevated LFT's.    Impression/Plan:   Elevated LFT's. 57 year old female with significant alcohol use disorder who presented with abd pain and ascites and anasarca and findings of deep jaundice. She has entered this hospital with alcoholic liver disease.  She does have some telangiectasias although her imaging does not show any splenomegaly and she had normal PLT count on admission that has now trended downward to 68 from 272. Treatment for alcoholic hepatitis started on Saturday 9/14 when her discriminant function was recalculated when her INR more than doubled from Friday into Saturday. Sunday 9/15 however she began withdrawing, CIWA protocol initiated. Much improved today. Patient oriented and alert. No signs of hepatic encephalopathy.  Abd U/S shows Portal vein is patent on color Doppler imaging with bidirectional flow. Hepatic steatosis with mildly nodular liver contour which could indicate cirrhosis w/ bidirectional portal vein flow. And ascites. CT Abd/pelvis shows hepatic steatosis.No biliary ductal dilatation. The gallbladder is without stones. Moderate to large ascites. Spleen normal in size.  Paracentesis  (9/13) where 1 L removed and negative for SBP on tap.    Day 4 of steroids, will calculate Lille on day 7 (Sept 20th)  to evaluate if her alcohol hepatitis is responding to therapy. MDF =44  (bili 7.4, PT 19.1, Control time 12).   MELD 3.0: 27 at 04/04/2023 MELD-Na: 26 at 04/04/2023  Albumin 2.3 > 2.9>2.7>2.5         Ammonia 44>74> 52             AFP 2.4 Trending downward :AST 311>370>187, ALT 109>144>117, alk Phosp 335>170>201>203>203     Total Bili 14.5>11.8>7.4 PT 16.3>28.5>25.3>22.4 >19.1  INR 1.3>2.7>2.3>1.9>1.6 -Daily INR/ PT -On CIWA protocol -Continue steroid therapy-will calculate Lille on day 7 (Sept 20th)  -Tolerated regular diet, lactulose oral, titrate to 2-3  bowel movements/daily -Continue to monitor neuro status closely.    Abdominal pain- lower abdominal pain for 1 month. Pt reports history of constipation with intermittent loose stools. Last BM this morning. Has not required pain medication since 9/13. CT shows thickening of the walls of the cecum and ascending colon,possible infectious or inflammatory colitis. -pain management per hospitalist. -Lactulose TID daily scheduled, titrate 2-3 bowel movements/day   Leukocytosis. WBC 21.7>13.2>13.3>12.3>12.7. Lactic acid >9>8.6>9.0>2.5>1.9. Afebrile. Blood cultures negative. Urine cultures positive Ecoli. On rocephin IV. Paracentesis negative SBP. Small bilateral pleural effusions on cxray 9/13. -pending cytology   Macrocytosis preceding anemia. Hgb 8.5>10.5>11.6>12.6>11.3. MCV 101.4 (macrocytosis). Negative fecal occult blood. PLT 139>128>100>68. No known history of anemia or blood transfusions. Pt reported 1 episode of black tarry stool 3 weeks ago. No overt bleeding at this time. B12 868. Folate 2.8. Iron panel 90. - replete per protocol   Hyponatremia 123>125>127>127. Creat 0.46<0.30>0.34. BUN 27>24>25>24 -continue to monitor   GERD. Patient reports history of GERD and takes OTC Pepcid prn. No EGD. CT abd/pelvis shows Mucosal enhancement  with thickening of the walls of the distal esophagus and gastric folds suggesting gastritis/esophagitis. Patient reports GERD symptoms.  -Increase Pantoprazole 40mg  IV from once daily to twice daily -Consider endoscopic evaluation during this hospitalization when stabilized  COPD - on home albuterol, on N/C 6L in hospital. Not on 02 at baseline.   Hx of tubular adenoma colonic polyps- overdue for colonoscopy by 1 year. Elective outpatient colonoscopy with Dr. Lavon Paganini.   History of diverticulitis, S/P sigmoidectomy in 2020.     Additional co-morbidities include : hypertension - on amlodipine, MS - not on treatment       Deanna J May  04/04/2023, 8:36  AM    Attending Physician Note   I have taken an interval history, reviewed the chart and examined the patient. I performed a substantive portion of this encounter, including complete performance of at least one of the key components, in conjunction with the APP. I agree with the APP's note, impression and recommendations with my edits. My additional impressions and recommendations are as follows.    Alcoholic hepatitis with ascites, hepatic steatosis, possible underlying cirrhosis, alcohol withdrawal. LFTs, t bili and INR improved. Mental status improved. MELD 3.0=27, improved. Day 4 of Solumedrol 40 mg IV qd for alcoholic hepatitis. Plan to change to prednisolone PO 40 mg qd tomorrow if she continues to take PO adequately. Lillie score at day 7. Continue Rocephin and CIWA protocol. Continue lactulose for now however asterixis was not noted again today so it is not clear that she has HE. Mental status changes could be withdrawal related.   Macrocytic anemia. Alcohol related and folate deficient. Replace folate.    GERD likely with esophagitis and probable gastritis leading to distal esophageal wall and gastric wall thickening on CT. Continue pantoprazole 40 mg IV q12h and change to PO tomorrow if she continues to take PO adequately. Consider EGD when stabilized.   Cecum, ascending colon wall thickening on CT - likely related to ascites  Malnutrition   Acute respiratory failure with hypoxemia and COPD   Hyponatremia, improved    MS   History of diverticulitis, S/P sigmoidectomy in 2020.    Personal history of adenomatous colon polyps. Elective outpatient colonoscopy with Dr. Lavon Paganini.   Claudette Head, MD [facility] See Loretha Stapler, [facility], for our on call provider

## 2023-04-04 NOTE — Plan of Care (Signed)
  Problem: Safety: Goal: Ability to remain free from injury will improve Outcome: Progressing   Problem: Skin Integrity: Goal: Risk for impaired skin integrity will decrease Outcome: Progressing   Problem: Pain Managment: Goal: General experience of comfort will improve Outcome: Progressing   Problem: Coping: Goal: Level of anxiety will decrease Outcome: Progressing   Problem: Nutrition: Goal: Adequate nutrition will be maintained Outcome: Progressing   Problem: Clinical Measurements: Goal: Ability to maintain clinical measurements within normal limits will improve Outcome: Progressing

## 2023-04-04 NOTE — TOC Progression Note (Signed)
Transition of Care [facility]) - Progression Note    Patient Details  Name: Christina Miller MRN: [id number] Date of Birth: [birth year]  Transition of Care [facility]) CM/SW Contact  Darleene Cleaver, [place] Phone Number: 04/04/2023, 6:21 PM  Clinical Narrative:     Substance abuser resources were added to patient's AVS.    Barriers to Discharge: Continued Medical Work up  Expected Discharge Plan and Services                                               Social Determinants of Health (SDOH) Interventions SDOH Screenings   Food Insecurity: No Food Insecurity (04/01/2023)  Housing: Low Risk  (04/01/2023)  Transportation Needs: No Transportation Needs (04/01/2023)  Utilities: Not At Risk (04/01/2023)  Depression (PHQ2-9): Low Risk  (04/02/2020)  Tobacco Use: High Risk (03/31/2023)    Readmission Risk Interventions     No data to display

## 2023-04-04 NOTE — Progress Notes (Signed)
Progress Note    Christina Miller   [id number]  DOB: 1965/08/22  DOA: 03/24/2023     4 PCP: Marcine Matar, MD  Initial CC: Abdominal distention  Hospital Course: Ms. Caraker is a 57 yo female with PMH alcohol abuse, suspected cirrhosis, hepatic steatosis, HTN, GERD, COPD who presented with abdominal fullness, intermittent fevers but no chills. She was having some N/V too. She reported a 12 pack beer daily on admission but also reports liquor use with further questioning too.  She was initially started on antibiotics due to concern for infection as well. Lab workup was consistent with alcoholic hepatitis after admission and she was initiated on steroids.  GI was also consulted on admission. Paracentesis was performed on admission removing 1 L fluid which was negative for SBP on cell count.  Interval History:  No events overnight.  Mentation still remaining improved and clear.  She has been attempting to take in nutrition as able.  Significant other bedside this morning.  Assessment and Plan: * Subacute liver failure - PT uptrended on 9/14. 16.3 >> 28.5 and INR as well 1.3 >> 2.7 - MELD-Na = 32 points as of 9/14 (~65% 90 day mortality) - continue Vit K 10 mg daily x 3 days per GI rec's - continue trending PT/INR - very sensitive to opoids and benzos causing decreased mentation/lethargy etc, need to be judicious with use; starting to develop some signs of etoh w/d on 9/16 - NH3 elevated; continue lactulose; okay for PO for now; if can't drink will go back to enema  Alcoholic hepatitis with ascites - MDF 32 on admission and now up to 88 pts on calculation 9/14 - continue solu-medrol; waxing/waning PO intake at times; mentation finally more consistently improved; agree with changing to oral steroid 9/18 per GI - high mortality overall in her clinical context; have discussed prognosis with patient and family - plan for Lillie score at day 7  Acute respiratory failure with  hypoxia (HCC) - increased oral secretions noted 9/15 and worsening mentation requiring oral suctioning; at risk for aspiration. Also s/p IVF on admission and PRBC in setting of hypoalbuminemia so 3rd spacing a risk as well - CXR repeated 9/15 shows worsening L effusion as expected; no signs of aspiration at this time yet - holding off on Lasix for now given concern for intravascular depletion at this time and may worsen renal function and she is not an HD candidate  - if develops fever will add flagyl on with Rocephin for aspiration coverage; trending WBC further still (remains elevated but improved from admission)  Alcohol abuse - continue CIWA protocol; caution with ativan unless unless absolutely needed - last drink 9/13 - drinks reportedly 12 pack beer daily and liquor use possibly up to 1/2 pint daily - etoh level 158 on admission   Acute hepatic encephalopathy (HCC) - See liver failure also - some of mentation worsening may have been due to ativan use during some w/d symptoms; overall mentation has cleared; no further concern for asterixis also - NH3 has downtrended - remains on lactulose but could do trial off if not felt to remain needed per GI  Hyponatremia - Na 118 on admission; asymptomatic; some chronic component expected in setting of alcohol use and presumed cirrhosis - suspected combo of alcohol abuse and poor intake - s/p IVF on admission and has improved and remaining stable  - continue trending -Appetite and mentation slowly improving  Acute cystitis - See abdominal pain as well -  Progress Note    Christina Miller   [id number]  DOB: 1965/08/22  DOA: 03/24/2023     4 PCP: Marcine Matar, MD  Initial CC: Abdominal distention  Hospital Course: Ms. Caraker is a 57 yo female with PMH alcohol abuse, suspected cirrhosis, hepatic steatosis, HTN, GERD, COPD who presented with abdominal fullness, intermittent fevers but no chills. She was having some N/V too. She reported a 12 pack beer daily on admission but also reports liquor use with further questioning too.  She was initially started on antibiotics due to concern for infection as well. Lab workup was consistent with alcoholic hepatitis after admission and she was initiated on steroids.  GI was also consulted on admission. Paracentesis was performed on admission removing 1 L fluid which was negative for SBP on cell count.  Interval History:  No events overnight.  Mentation still remaining improved and clear.  She has been attempting to take in nutrition as able.  Significant other bedside this morning.  Assessment and Plan: * Subacute liver failure - PT uptrended on 9/14. 16.3 >> 28.5 and INR as well 1.3 >> 2.7 - MELD-Na = 32 points as of 9/14 (~65% 90 day mortality) - continue Vit K 10 mg daily x 3 days per GI rec's - continue trending PT/INR - very sensitive to opoids and benzos causing decreased mentation/lethargy etc, need to be judicious with use; starting to develop some signs of etoh w/d on 9/16 - NH3 elevated; continue lactulose; okay for PO for now; if can't drink will go back to enema  Alcoholic hepatitis with ascites - MDF 32 on admission and now up to 88 pts on calculation 9/14 - continue solu-medrol; waxing/waning PO intake at times; mentation finally more consistently improved; agree with changing to oral steroid 9/18 per GI - high mortality overall in her clinical context; have discussed prognosis with patient and family - plan for Lillie score at day 7  Acute respiratory failure with  hypoxia (HCC) - increased oral secretions noted 9/15 and worsening mentation requiring oral suctioning; at risk for aspiration. Also s/p IVF on admission and PRBC in setting of hypoalbuminemia so 3rd spacing a risk as well - CXR repeated 9/15 shows worsening L effusion as expected; no signs of aspiration at this time yet - holding off on Lasix for now given concern for intravascular depletion at this time and may worsen renal function and she is not an HD candidate  - if develops fever will add flagyl on with Rocephin for aspiration coverage; trending WBC further still (remains elevated but improved from admission)  Alcohol abuse - continue CIWA protocol; caution with ativan unless unless absolutely needed - last drink 9/13 - drinks reportedly 12 pack beer daily and liquor use possibly up to 1/2 pint daily - etoh level 158 on admission   Acute hepatic encephalopathy (HCC) - See liver failure also - some of mentation worsening may have been due to ativan use during some w/d symptoms; overall mentation has cleared; no further concern for asterixis also - NH3 has downtrended - remains on lactulose but could do trial off if not felt to remain needed per GI  Hyponatremia - Na 118 on admission; asymptomatic; some chronic component expected in setting of alcohol use and presumed cirrhosis - suspected combo of alcohol abuse and poor intake - s/p IVF on admission and has improved and remaining stable  - continue trending -Appetite and mentation slowly improving  Acute cystitis - See abdominal pain as well -  Progress Note    Christina Miller   [id number]  DOB: 1965/08/22  DOA: 03/24/2023     4 PCP: Marcine Matar, MD  Initial CC: Abdominal distention  Hospital Course: Ms. Caraker is a 57 yo female with PMH alcohol abuse, suspected cirrhosis, hepatic steatosis, HTN, GERD, COPD who presented with abdominal fullness, intermittent fevers but no chills. She was having some N/V too. She reported a 12 pack beer daily on admission but also reports liquor use with further questioning too.  She was initially started on antibiotics due to concern for infection as well. Lab workup was consistent with alcoholic hepatitis after admission and she was initiated on steroids.  GI was also consulted on admission. Paracentesis was performed on admission removing 1 L fluid which was negative for SBP on cell count.  Interval History:  No events overnight.  Mentation still remaining improved and clear.  She has been attempting to take in nutrition as able.  Significant other bedside this morning.  Assessment and Plan: * Subacute liver failure - PT uptrended on 9/14. 16.3 >> 28.5 and INR as well 1.3 >> 2.7 - MELD-Na = 32 points as of 9/14 (~65% 90 day mortality) - continue Vit K 10 mg daily x 3 days per GI rec's - continue trending PT/INR - very sensitive to opoids and benzos causing decreased mentation/lethargy etc, need to be judicious with use; starting to develop some signs of etoh w/d on 9/16 - NH3 elevated; continue lactulose; okay for PO for now; if can't drink will go back to enema  Alcoholic hepatitis with ascites - MDF 32 on admission and now up to 88 pts on calculation 9/14 - continue solu-medrol; waxing/waning PO intake at times; mentation finally more consistently improved; agree with changing to oral steroid 9/18 per GI - high mortality overall in her clinical context; have discussed prognosis with patient and family - plan for Lillie score at day 7  Acute respiratory failure with  hypoxia (HCC) - increased oral secretions noted 9/15 and worsening mentation requiring oral suctioning; at risk for aspiration. Also s/p IVF on admission and PRBC in setting of hypoalbuminemia so 3rd spacing a risk as well - CXR repeated 9/15 shows worsening L effusion as expected; no signs of aspiration at this time yet - holding off on Lasix for now given concern for intravascular depletion at this time and may worsen renal function and she is not an HD candidate  - if develops fever will add flagyl on with Rocephin for aspiration coverage; trending WBC further still (remains elevated but improved from admission)  Alcohol abuse - continue CIWA protocol; caution with ativan unless unless absolutely needed - last drink 9/13 - drinks reportedly 12 pack beer daily and liquor use possibly up to 1/2 pint daily - etoh level 158 on admission   Acute hepatic encephalopathy (HCC) - See liver failure also - some of mentation worsening may have been due to ativan use during some w/d symptoms; overall mentation has cleared; no further concern for asterixis also - NH3 has downtrended - remains on lactulose but could do trial off if not felt to remain needed per GI  Hyponatremia - Na 118 on admission; asymptomatic; some chronic component expected in setting of alcohol use and presumed cirrhosis - suspected combo of alcohol abuse and poor intake - s/p IVF on admission and has improved and remaining stable  - continue trending -Appetite and mentation slowly improving  Acute cystitis - See abdominal pain as well -  Progress Note    Christina Miller   [id number]  DOB: 1965/08/22  DOA: 03/23/2023     4 PCP: Marcine Matar, MD  Initial CC: Abdominal distention  Hospital Course: Ms. Caraker is a 57 yo female with PMH alcohol abuse, suspected cirrhosis, hepatic steatosis, HTN, GERD, COPD who presented with abdominal fullness, intermittent fevers but no chills. She was having some N/V too. She reported a 12 pack beer daily on admission but also reports liquor use with further questioning too.  She was initially started on antibiotics due to concern for infection as well. Lab workup was consistent with alcoholic hepatitis after admission and she was initiated on steroids.  GI was also consulted on admission. Paracentesis was performed on admission removing 1 L fluid which was negative for SBP on cell count.  Interval History:  No events overnight.  Mentation still remaining improved and clear.  She has been attempting to take in nutrition as able.  Significant other bedside this morning.  Assessment and Plan: * Subacute liver failure - PT uptrended on 9/14. 16.3 >> 28.5 and INR as well 1.3 >> 2.7 - MELD-Na = 32 points as of 9/14 (~65% 90 day mortality) - continue Vit K 10 mg daily x 3 days per GI rec's - continue trending PT/INR - very sensitive to opoids and benzos causing decreased mentation/lethargy etc, need to be judicious with use; starting to develop some signs of etoh w/d on 9/16 - NH3 elevated; continue lactulose; okay for PO for now; if can't drink will go back to enema  Alcoholic hepatitis with ascites - MDF 32 on admission and now up to 88 pts on calculation 9/14 - continue solu-medrol; waxing/waning PO intake at times; mentation finally more consistently improved; agree with changing to oral steroid 9/18 per GI - high mortality overall in her clinical context; have discussed prognosis with patient and family - plan for Lillie score at day 7  Acute respiratory failure with  hypoxia (HCC) - increased oral secretions noted 9/15 and worsening mentation requiring oral suctioning; at risk for aspiration. Also s/p IVF on admission and PRBC in setting of hypoalbuminemia so 3rd spacing a risk as well - CXR repeated 9/15 shows worsening L effusion as expected; no signs of aspiration at this time yet - holding off on Lasix for now given concern for intravascular depletion at this time and may worsen renal function and she is not an HD candidate  - if develops fever will add flagyl on with Rocephin for aspiration coverage; trending WBC further still (remains elevated but improved from admission)  Alcohol abuse - continue CIWA protocol; caution with ativan unless unless absolutely needed - last drink 9/13 - drinks reportedly 12 pack beer daily and liquor use possibly up to 1/2 pint daily - etoh level 158 on admission   Acute hepatic encephalopathy (HCC) - See liver failure also - some of mentation worsening may have been due to ativan use during some w/d symptoms; overall mentation has cleared; no further concern for asterixis also - NH3 has downtrended - remains on lactulose but could do trial off if not felt to remain needed per GI  Hyponatremia - Na 118 on admission; asymptomatic; some chronic component expected in setting of alcohol use and presumed cirrhosis - suspected combo of alcohol abuse and poor intake - s/p IVF on admission and has improved and remaining stable  - continue trending -Appetite and mentation slowly improving  Acute cystitis - See abdominal pain as well -

## 2023-04-05 DIAGNOSIS — G35 Multiple sclerosis: Secondary | ICD-10-CM

## 2023-04-05 DIAGNOSIS — J9601 Acute respiratory failure with hypoxia: Secondary | ICD-10-CM | POA: Diagnosis not present

## 2023-04-05 DIAGNOSIS — K7200 Acute and subacute hepatic failure without coma: Secondary | ICD-10-CM | POA: Diagnosis not present

## 2023-04-05 DIAGNOSIS — K7011 Alcoholic hepatitis with ascites: Secondary | ICD-10-CM | POA: Diagnosis not present

## 2023-04-05 DIAGNOSIS — K2289 Other specified disease of esophagus: Secondary | ICD-10-CM

## 2023-04-05 DIAGNOSIS — K760 Fatty (change of) liver, not elsewhere classified: Secondary | ICD-10-CM | POA: Diagnosis not present

## 2023-04-05 DIAGNOSIS — F1010 Alcohol abuse, uncomplicated: Secondary | ICD-10-CM | POA: Diagnosis not present

## 2023-04-05 DIAGNOSIS — D539 Nutritional anemia, unspecified: Secondary | ICD-10-CM | POA: Diagnosis not present

## 2023-04-05 LAB — CULTURE, BLOOD (ROUTINE X 2)
Culture: NO GROWTH
Culture: NO GROWTH
Special Requests: ADEQUATE

## 2023-04-05 LAB — AEROBIC/ANAEROBIC CULTURE W GRAM STAIN (SURGICAL/DEEP WOUND)
Culture: NO GROWTH
Gram Stain: NONE SEEN

## 2023-04-05 LAB — GLUCOSE, CAPILLARY
Glucose-Capillary: 123 mg/dL — ABNORMAL HIGH (ref 70–99)
Glucose-Capillary: 130 mg/dL — ABNORMAL HIGH (ref 70–99)
Glucose-Capillary: 173 mg/dL — ABNORMAL HIGH (ref 70–99)
Glucose-Capillary: 176 mg/dL — ABNORMAL HIGH (ref 70–99)

## 2023-04-05 MED ORDER — PREDNISOLONE 5 MG PO TABS
40.0000 mg | ORAL_TABLET | Freq: Every day | ORAL | Status: DC
  Administered 2023-04-06: 40 mg via ORAL
  Filled 2023-04-05 (×2): qty 8

## 2023-04-05 NOTE — Plan of Care (Signed)

## 2023-04-05 NOTE — Progress Notes (Addendum)
Progress Note  Primary GI: Dr. Lavon Paganini  LOS: 5 days   Chief Complaint:elevated LFTs   Subjective   Patient lying in bed, significant other at bedside. A&Ox3. Reports she did not sleep well due to " kids running up and down the hall last night." Her pyrosis has improved with increasing frequency of PPI to twice daily. No nausea or vomiting. No abdominal pain. Foley cathter removed last night per RN. Flexiseal bag in place with soft brown output in bag. Asterixis was not noted today. On Room air.   Objective   Vital signs in last 24 hours: Temp:  [97.5 F (36.4 C)-98 F (36.7 C)] 97.5 F (36.4 C) (09/18 0512) Pulse Rate:  [98-108] 108 (09/17 2146) Resp:  [16-18] 16 (09/18 0512) BP: (117-135)/(86-96) 135/96 (09/18 0512) SpO2:  [95 %-100 %] 95 % (09/18 0512) Weight:  [49.7 kg] 49.7 kg (09/18 0512) Last BM Date : 04/04/23 Last BM recorded by nurses in past 5 days Stool Type: Type 7 (Liquid consistency with no solid pieces) (04/04/2023  8:00 PM)  General:  frail, chronically ill female in no acute distress  Heart:  Regular rate and rhythm; no murmurs Pulm:  diminished lung sounds, wheezing noted upper lobes , on RA Abdomen: soft, nondistended, normal bowel sounds in all quadrants. Nontender without guarding. No organomegaly appreciated. Positive fluid wave test Extremities:  Trace edema in BLE Neurologic:  Alert and  oriented x4;  No focal deficits. Asterixis was not noted today.  Psych:  Cooperative. Anxious today.  Intake/Output from previous day: 09/17 0701 - 09/18 0700 In: 720 [P.O.:720] Out: 450 [Urine:450] Intake/Output this shift: No intake/output data recorded.  Studies/Results: No results found.  Lab Results: Recent Labs    04/03/23 0259 04/04/23 0425 04/05/23 0430  WBC 12.3* 12.7* 13.3*  HGB 12.6 11.3* 11.3*  HCT 37.0 33.8* 34.6*  PLT 100* 68* 51*   BMET Recent Labs    04/03/23 0259 04/04/23 0425 04/05/23 0430  NA 127* 127* 128*  K 4.1 3.6 3.8   CL 96* 98 99  CO2 21* 23 21*  GLUCOSE 117* 125* 119*  BUN 25* 24* 24*  CREATININE <0.30* 0.34* <0.30*  CALCIUM 8.2* 7.8* 7.7*   LFT Recent Labs    04/05/23 0430  PROT 5.1*  ALBUMIN 2.0*  AST 114*  ALT 96*  ALKPHOS 200*  BILITOT 5.9*   PT/INR Recent Labs    04/04/23 0425 04/05/23 0430  LABPROT 19.1* 18.3*  INR 1.6* 1.5*     Scheduled Meds:  Chlorhexidine Gluconate Cloth  6 each Topical Daily   feeding supplement  237 mL Oral TID BM   folic acid  1 mg Oral Daily   lactulose  20 g Oral TID   methylPREDNISolone (SOLU-MEDROL) injection  40 mg Intravenous Daily   multivitamin with minerals  1 tablet Oral Daily   nicotine  14 mg Transdermal Daily   pantoprazole (PROTONIX) IV  40 mg Intravenous Q12H   saccharomyces boulardii  250 mg Oral BID   thiamine  100 mg Oral Daily   Or   thiamine  100 mg Intravenous Daily   Continuous Infusions:  cefTRIAXone (ROCEPHIN)  IV Stopped (04/04/23 1018)     Patient narrative:  57 y.o. year old female with GERD, hypertension, alcohol use disorder, MS (2003),COPD, diverticulosis complicated with perforated sigmoid diverticulitis with complex abscess (2019). Presents presents to the emergency department stating that she has been experiencing diffuse abdominal pain, nausea and vomiting for a month. GI consulted  for elevated LFT's.     Impression/Plan:   Elevated LFT's. 57 year old female with significant alcohol use disorder who presented with abd pain/ascites/anasarca and findings of deep jaundice. She has entered this hospital with alcoholic liver disease.  She does have some telangiectasias although her imaging does not show any splenomegaly and she had normal PLT count on admission that has now trended downward to 51 from 272. Treatment for alcoholic hepatitis started on Saturday 9/14 when her discriminant function was recalculated when her INR more than doubled from Friday into Saturday. Sunday 9/15 she began withdrawing, CIWA protocol  initiated. Much improved last two days. Appears more anxious today than yesterday. Patient oriented and alert. No signs of hepatic encephalopathy.  Abd U/S shows Portal vein is patent on color Doppler imaging with bidirectional flow. Hepatic steatosis with mildly nodular liver contour which could indicate cirrhosis w/ bidirectional portal vein flow. And ascites. CT Abd/pelvis shows hepatic steatosis.No biliary ductal dilatation. The gallbladder is without stones. Moderate to large ascites. Spleen normal in size.  Paracentesis  (9/13) where 1 L removed and negative for SBP on tap.    Day 5 of steroids, will calculate Lille on day 7 (Sept 20th)  to evaluate if her alcohol hepatitis is responding to therapy. MELD 3.0: 25 at 04/05/2023 MELD-Na: 25 at 04/05/2023  Albumin 2.3 > 2.5 >2.1>2.0        Ammonia 44>74> 52             AFP 2.4 Trending downward :AST (311)>187>114, ALT (109)>117>96,  alk Phosp (335)>203>200     Total Bili 14.5>11.8>7.4>5.9 PT 16.3>19.1>18.3  INR 1.3>1.6>1.5 -Daily INR/ PT -On CIWA protocol -Continue steroid therapy-will calculate Lille on day 7 (Sept 20th)  -Continue regular diet -Continue lactulose po, titrate to 2-3 bowel movements/daily -Continue to monitor neuro status closely.    Abdominal pain- lower abdominal pain for 1 month. Pt reports history of constipation with intermittent loose stools. Last BM this morning. Has not required pain medication since 9/13. CT shows thickening of the walls of the cecum and ascending colon,possible infectious or inflammatory colitis. -pain management per hospitalist. -Lactulose TID daily scheduled, titrate 2-3 bowel movements/day   Leukocytosis. WBC 21.7>13.2>13.3>12.3>12.7>13.3. Lactic acid >9>8.6>9.0>2.5>1.9. Afebrile. Blood cultures negative. Urine cultures positive Ecoli. On rocephin IV. Paracentesis negative SBP. Small bilateral pleural effusions on cxray 9/13.Cytology shows reactive mesothelial cells present   Macrocytosis  preceding anemia. Hgb 8.5>10.5>11.6>12.6>11.3>11.3. MCV 103.6 (macrocytosis). Negative fecal occult blood. PLT 139>128>100>68>51. No known history of anemia or blood transfusions. Pt reported 1 episode of black tarry stool 3 weeks ago. No overt bleeding at this time. 9/13: B12- 868. Folate 2.8. Iron panel 90. - replete per protocol   Hyponatremia 123>125>127>128. Creat <0.30. BUN 24 -continue to monitor   GERD. Patient reports history of GERD and takes OTC Pepcid prn. No EGD. CT abd/pelvis shows Mucosal enhancement with thickening of the walls of the distal esophagus and gastric folds suggesting gastritis/esophagitis. Patient reports GERD symptoms.  -Continue Pantoprazole 40mg  IV twice daily -Consider endoscopic evaluation during this hospitalization when stabilized   COPD - on home albuterol, on RA today.    Hx of tubular adenoma colonic polyps- overdue for colonoscopy by 1 year. Elective outpatient colonoscopy with Dr. Lavon Paganini.    History of diverticulitis, S/P sigmoidectomy in 2020.    Additional co-morbidities include : hypertension - on amlodipine, MS - not on treatment  Christina Miller  04/05/2023, 8:25 AM  Attending Physician Note   I have taken an interval  history, reviewed the chart and examined the patient. I performed a substantive portion of this encounter, including complete performance of at least one of the key components, in conjunction with the APP. I agree with the APP's note, impression and recommendations with my edits. My additional impressions and recommendations are as follows.   Alcoholic hepatitis with ascites, hepatic steatosis, possible underlying cirrhosis, alcohol withdrawal. LFTs, t bili and INR improved. Mental status improved. MELD 3.0=25, improved. Day 5 of Solumedrol 40 mg IV qd for alcoholic hepatitis. Change to prednisolone PO 40 mg qd today. Lillie score at day 7. Continue Rocephin and CIWA protocol. Continue lactulose for now however asterixis was not noted  again today so it is not clear that she has HE. Mental status changes could be withdrawal related.   Macrocytic anemia. Alcohol related and folate deficient. Replace folate.   Thrombocytopenia, worsening, 51k today.    GERD likely with esophagitis and probable gastritis leading to distal esophageal wall and gastric wall thickening on CT. Continue pantoprazole 40 mg IV q12h and change to PO tomorrow if she continues to take PO adequately. Consider EGD when stabilized.    Cecum, ascending colon wall thickening on CT - likely related to ascites   Malnutrition    Acute respiratory failure with hypoxemia and COPD   Hyponatremia, improved    MS   History of diverticulitis, S/P sigmoidectomy in 2020.    Personal history of adenomatous colon polyps. Elective outpatient colonoscopy with Dr. Lavon Paganini.   Claudette Head, MD [facility] See Loretha Stapler, [facility], for our on call provider

## 2023-04-05 NOTE — Progress Notes (Signed)
PROGRESS NOTE    Christina Miller  ZOX:[id number] DOB: 17-Jun-1966 DOA: 04/15/2023 PCP: Marcine Matar, MD   Brief Narrative:  Christina Miller is a 57 yo female with PMH alcohol abuse, suspected cirrhosis, hepatic steatosis, HTN, GERD, COPD who presented with abdominal fullness, intermittent fevers but no chills. She was having some N/V too. She reported a 12 pack beer daily on admission but also reports liquor use with further questioning too.  She was initially started on antibiotics due to concern for infection as well. Lab workup was consistent with alcoholic hepatitis after admission and she was initiated on steroids.  GI was also consulted on admission. Paracentesis was performed on admission removing 1 L fluid which was negative for SBP on cell count.  Assessment and Plan:  Subacute Liver Failure -PT uptrended on 9/14. 16.3 >> 28.5 and INR as well 1.3 >> 2.7 -MELD-Na = 32 points as of 9/14 (~65% 90 day mortality) -Continue Vit K 10 mg daily x 3 days per GI rec's -Continue trending PT/INR and is now 18.3/1.5 -Very sensitive to opoids and benzos causing decreased mentation/lethargy etc, need to be judicious with use; starting to develop some signs of etoh w/d on 9/16 -NH3 elevated; continue lactulose; okay for PO for now; if can't drink will go back to enema   Alcoholic Hepatitis with Ascites Hyperbilirubinemia - MDF 32 on admission and now up to 88 pts on calculation 9/14 -Continue solu-medrol; waxing/waning PO intake at times; mentation finally more consistently improved; agree with changing to oral steroid 9/18 per GI -LFT Trend and Bilirubin Trend: Recent Labs  Lab 03/31/23 0019 04/01/23 0614 04/02/23 0256 04/03/23 0259 04/04/23 0425 04/05/23 0430  AST 311* 731* 560* 370* 187* 114*  ALT 109* 144* 156* 144* 117* 96*   Recent Labs  Lab 03/31/23 0019 04/01/23 0614 04/02/23 0256 04/03/23 0259 04/04/23 0425 04/05/23 0430  BILITOT 12.2* 13.0* 14.5* 11.8* 7.4* 5.9*   -High mortality overall in her clinical context; have discussed prognosis with patient and family -Plan for Lillie score at day 7 (Day 5 of Solumedrol 40 mg for Alcoholic Hepatitis   Acute Respiratory Failure with Hypoxia (HCC) -Increased oral secretions noted 9/15 and worsening mentation requiring oral suctioning; at risk for aspiration. Also s/p IVF on admission and PRBC in setting of hypoalbuminemia so 3rd spacing a risk as well -CXR repeated 9/15 shows worsening L effusion as expected; no signs of aspiration at this time yet -Holding off on Lasix for now given concern for intravascular depletion at this time and may worsen renal function and she is not an HD candidate  -If develops fever will add flagyl on with Rocephin for aspiration coverage; trending WBC further still (remains elevated but improved from admission)   Alcohol Abuse -continue CIWA protocol; caution with ativan unless unless absolutely needed -last drink 9/13 -drinks reportedly 12 pack beer daily and liquor use possibly up to 1/2 pint daily -ETOH level 158 on admission    Acute Hepatic Encephalopathy (HCC) -See liver failure also -Some of mentation worsening may have been due to ativan use during some w/d symptoms; overall mentation has cleared; no further concern for asterixis also -NH3 has downtrended -Remains on lactulose but could do trial off if not felt to remain needed per GI   Hyponatremia -Na 118 on admission; asymptomatic; some chronic component expected in setting of alcohol use and presumed cirrhosis -Suspected combo of alcohol abuse and poor intake -s/p IVF on admission and has improved and remaining stable  -Na+  Trend: Recent Labs  Lab 04/01/23 0046 04/01/23 0614 04/01/23 0728 04/02/23 0256 04/03/23 0259 04/04/23 0425 04/05/23 0430  NA 120* 121* 123* 125* 127* 127* 128*  -Continue Trending -Appetite and mentation slowly improving   Acute Cystitis -See abdominal pain as well -UA suggestive  of infection.  Urine culture growing E. coli.  Reviewed sens -Continue Rocephin x 5 days (to be completed 9/18)   Abdominal Pain, Generalized -Suspect from small body habitus and distension from ascites -S/p paracentesis on 03/31/2023 removing 1 L fluid -Fluid studies negative for SBP -initial WBC 21.7 and downtrend and now trend shows: Recent Labs  Lab 03/31/23 0019 04/01/23 0614 04/02/23 0256 04/03/23 0259 04/04/23 0425 04/05/23 0430  WBC 21.7* 13.2* 13.3* 12.3* 12.7* 13.3*  -Blood cultures in process -UA possibly consistent with UTI -De-escalate abx back down to Rocephin and will cover urine for now; no other obvious source infection at this time; urine culture growing E. coli, sensitivities reviewed  Metabolic Acidosis -Plan patient is a slight metabolic acidosis with a CO2 of 21, anion gap of 8, chloride level of 99 at-continue monitor trend and repeat CBC in a.m.   Macrocytic Anemia -Presumed bone marrow suppression from excessive alcohol abuse -Folate also low, 2.8 -B12 adequate, 868 -Continue folic acid and multivitamin -Hgb/Hct Trend: Recent Labs  Lab 03/31/23 0019 04/01/23 0614 04/01/23 1806 04/02/23 0256 04/03/23 0259 04/04/23 0425 04/05/23 0430  HGB 8.5* 5.2* 10.5* 11.6* 12.6 11.3* 11.3*  HCT 25.5* 16.5* 31.0* 34.1* 37.0 33.8* 34.6*  MCV 112.8* 118.7*  --  98.8 101.4* 103.0* 103.6*  -Hgb down from 8.5 g/dL on admission to 5.2 g/dL on 1/61.  Possibly hemodilution as received fluid resuscitation since admission due to hypotension.  FOBT negative -Transfused 2 units PRBC on 9/14 and now Hgb 11.6 g/dL yesterday (question the validity of the 5.2 g/dL on 0/96, may have been lab error in retrospect) -Continue to monitor trend and repeat CBC in a.m.   Abnormal CT scan, colon - CT notes thickened walls of cecum and ascending colon along with mucosal enhancement and thickening of walls of distal esophagus and gastric folds -Findings may be in setting of inflammatory  context -GI following as well.  She may at some point need further endoscopy evaluation when more stable   Hypotension-resolved as of 04/03/2023 -Lactic elevated in setting of liver failure more than due to sepsis -Accept MAP>60 in setting of liver disease  -S/p IVF; will get further volume expansion with PRBC  Thrombocytopenia -Platelet Count Trend: Recent Labs  Lab 03/31/23 0019 04/01/23 0614 04/02/23 0256 04/03/23 0259 04/04/23 0425 04/05/23 0430  PLT 272 139* 128* 100* 68* 51*  -Continue to Monitor and Trend Platelet Count Trend and repeat CBC in the AM    Esophageal Thickening -See abnormal CT   Multiple sclerosis (HCC) -Not on treatment   Hypoalbuminemia -Patient's Albumin Trend: Recent Labs  Lab 03/31/23 0019 04/01/23 0614 04/02/23 0256 04/03/23 0259 04/04/23 0425 04/05/23 0430  ALBUMIN 2.3* 2.9* 2.7* 2.5* 2.1* 2.0*  -Continue to Monitor and Trend and repeat CMP in the AM   DVT prophylaxis: SCDs Start: 03/31/23 0729    Code Status: Full Code Family Communication: No family present at bedside   Disposition Plan:  Level of care: Progressive Status is: Inpatient Remains inpatient appropriate because: Needs further clinical improvement    Consultants:  Gastroenterology  Procedures:  As delineated as above  Antimicrobials:  Anti-infectives (From admission, onward)    Start     Dose/Rate Route Frequency  Ordered Stop   04/03/23 0400  vancomycin (VANCOREADY) IVPB 750 mg/150 mL  Status:  Discontinued        750 mg 150 mL/hr over 60 Minutes Intravenous Every 48 hours 04/01/23 0307 04/01/23 1238   04/01/23 1330  cefTRIAXone (ROCEPHIN) 2 g in sodium chloride 0.9 % 100 mL IVPB        2 g 200 mL/hr over 30 Minutes Intravenous Daily 04/01/23 1316 04/05/23 1102   04/01/23 0900  cefTRIAXone (ROCEPHIN) 2 g in sodium chloride 0.9 % 100 mL IVPB  Status:  Discontinued        2 g 200 mL/hr over 30 Minutes Intravenous Every 24 hours 03/31/23 0843 04/01/23 0241    04/01/23 0330  vancomycin (VANCOREADY) IVPB 750 mg/150 mL        750 mg 150 mL/hr over 60 Minutes Intravenous  Once 04/01/23 0233 04/01/23 0500   04/01/23 0300  ceFEPIme (MAXIPIME) 2 g in sodium chloride 0.9 % 100 mL IVPB  Status:  Discontinued        2 g 200 mL/hr over 30 Minutes Intravenous Every 12 hours 04/01/23 0233 04/01/23 1238   03/31/23 2200  metroNIDAZOLE (FLAGYL) IVPB 500 mg  Status:  Discontinued        500 mg 100 mL/hr over 60 Minutes Intravenous Every 12 hours 03/31/23 1402 04/01/23 1238   03/31/23 0800  cefTRIAXone (ROCEPHIN) 2 g in sodium chloride 0.9 % 100 mL IVPB  Status:  Discontinued        2 g 200 mL/hr over 30 Minutes Intravenous Every 24 hours 03/31/23 0727 03/31/23 0843   03/31/23 0515  metroNIDAZOLE (FLAGYL) IVPB 500 mg  Status:  Discontinued        500 mg 100 mL/hr over 60 Minutes Intravenous Every 8 hours 03/31/23 0513 03/31/23 1402   03/31/23 0500  cefTRIAXone (ROCEPHIN) 1 g in sodium chloride 0.9 % 100 mL IVPB        1 g 200 mL/hr over 30 Minutes Intravenous  Once 03/31/23 0448 03/31/23 0708       Subjective: Seen and examined at bedside and is doing a little better. No CP. Still having abdominal pain. Denies any other concerns or complaints at this time.   Objective: Vitals:   04/04/23 1401 04/04/23 2146 04/05/23 0512 04/05/23 1158  BP: (!) 117/96 118/86 (!) 135/96 112/86  Pulse: 98 (!) 108  (!) 104  Resp: 18 16 16 18   Temp: 98 F (36.7 C) 97.7 F (36.5 C) (!) 97.5 F (36.4 C) 97.6 F (36.4 C)  TempSrc: Oral Oral Oral Oral  SpO2: 100% 100% 95% 95%  Weight:   49.7 kg   Height:        Intake/Output Summary (Last 24 hours) at 04/05/2023 2007 Last data filed at 04/05/2023 1850 Gross per 24 hour  Intake 840 ml  Output 350 ml  Net 490 ml   Filed Weights   04/03/23 0500 04/04/23 0500 04/05/23 0512  Weight: 50.9 kg 48.5 kg 49.7 kg   Examination: Physical Exam:  Constitutional: Thin Caucasian chronically ill appearing female in  NAD Respiratory: Diminished to auscultation bilaterally, no wheezing, rales, rhonchi or crackles. Normal respiratory effort and patient is not tachypenic. No accessory muscle use. Unlabored breathing  Cardiovascular: RRR, no murmurs / rubs / gallops. S1 and S2 auscultated. No extremity edema. Abdomen: Soft, slightly tender, mildly distended.  Bowel sounds positive.  GU: Deferred. Musculoskeletal: No clubbing / cyanosis of digits/nails. No joint deformity upper and lower extremities.  Skin:  No rashes, lesions, ulcers on a limited skin evaluation but has bruising.  Neurologic: CN 2-12 grossly intact with no focal deficits. Romberg sign cerebellar reflexes not assessed.   Data Reviewed: I have personally reviewed following labs and imaging studies  CBC: Recent Labs  Lab 04/01/23 0614 04/01/23 1806 04/02/23 0256 04/03/23 0259 04/04/23 0425 04/05/23 0430  WBC 13.2*  --  13.3* 12.3* 12.7* 13.3*  NEUTROABS 10.7*  --  11.5* 10.2* 10.1* 10.1*  HGB 5.2* 10.5* 11.6* 12.6 11.3* 11.3*  HCT 16.5* 31.0* 34.1* 37.0 33.8* 34.6*  MCV 118.7*  --  98.8 101.4* 103.0* 103.6*  PLT 139*  --  128* 100* 68* 51*   Basic Metabolic Panel: Recent Labs  Lab 04/01/23 0728 04/02/23 0256 04/03/23 0259 04/04/23 0425 04/05/23 0430  NA 123* 125* 127* 127* 128*  K 4.0 4.1 4.1 3.6 3.8  CL 92* 94* 96* 98 99  CO2 19* 20* 21* 23 21*  GLUCOSE 117* 112* 117* 125* 119*  BUN 27* 24* 25* 24* 24*  CREATININE 0.73 0.46 <0.30* 0.34* <0.30*  CALCIUM 7.2* 7.7* 8.2* 7.8* 7.7*  MG  --  1.7 2.0 2.1 1.9   GFR: CrCl cannot be calculated (This lab value cannot be used to calculate CrCl because it is not a number: <0.30). Liver Function Tests: Recent Labs  Lab 04/01/23 0614 04/02/23 0256 04/03/23 0259 04/04/23 0425 04/05/23 0430  AST 731* 560* 370* 187* 114*  ALT 144* 156* 144* 117* 96*  ALKPHOS 170* 201* 203* 203* 200*  BILITOT 13.0* 14.5* 11.8* 7.4* 5.9*  PROT 5.2* 5.6* 5.6* 5.0* 5.1*  ALBUMIN 2.9* 2.7* 2.5* 2.1*  2.0*   Recent Labs  Lab 03/31/23 0019  LIPASE 31   Recent Labs  Lab 03/31/23 0019 04/01/23 1806 04/03/23 0259  AMMONIA 44* 74* 52*   Coagulation Profile: Recent Labs  Lab 04/01/23 0614 04/02/23 0256 04/03/23 0259 04/04/23 0425 04/05/23 0430  INR 2.7* 2.3* 1.9* 1.6* 1.5*   Cardiac Enzymes: No results for input(s): "CKTOTAL", "CKMB", "CKMBINDEX", "TROPONINI" in the last 168 hours. BNP (last 3 results) No results for input(s): "PROBNP" in the last 8760 hours. HbA1C: No results for input(s): "HGBA1C" in the last 72 hours. CBG: Recent Labs  Lab 04/04/23 1707 04/04/23 2142 04/05/23 0743 04/05/23 1155 04/05/23 1629  GLUCAP 152* 185* 123* 130* 173*   Lipid Profile: No results for input(s): "CHOL", "HDL", "LDLCALC", "TRIG", "CHOLHDL", "LDLDIRECT" in the last 72 hours. Thyroid Function Tests: Recent Labs    04/03/23 0259  TSH 2.367   Anemia Panel: No results for input(s): "VITAMINB12", "FOLATE", "FERRITIN", "TIBC", "IRON", "RETICCTPCT" in the last 72 hours. Sepsis Labs: Recent Labs  Lab 03/31/23 0311 03/31/23 2004 04/01/23 1335 04/02/23 0256  LATICACIDVEN 8.6* >9.0* 2.5* 1.9    Recent Results (from the past 240 hour(s))  Culture, blood (Routine X 2) w Reflex to ID Panel     Status: None   Collection Time: 03/31/23  5:36 AM   Specimen: BLOOD  Result Value Ref Range Status   Specimen Description   Final    BLOOD LEFT ANTECUBITAL Performed at [facility], [address] [place], [place] [address]    Special Requests   Final    BOTTLES DRAWN AEROBIC AND ANAEROBIC Blood Culture results may not be optimal due to an excessive volume of blood received in culture bottles Performed at [facility], [address], [place], [place] [address]    Culture   Final    NO GROWTH  5 DAYS Performed at [facility], [address], [place], [place] [address]    Report Status 04/05/2023 FINAL  Final  Urine Culture     Status:  Abnormal   Collection Time: 03/31/23  6:36 AM   Specimen: Urine, Random  Result Value Ref Range Status   Specimen Description   Final    URINE, RANDOM Performed at [facility], [address] [place], [place] [address]    Special Requests   Final    NONE Reflexed from [id number] Performed at [facility], [address] [place], [place] [address]    Culture >=100,000 COLONIES/mL ESCHERICHIA COLI (A)  Final   Report Status 04/02/2023 FINAL  Final   Organism ID, Bacteria ESCHERICHIA COLI (A)  Final      Susceptibility   Escherichia coli - MIC*    AMPICILLIN >=32 RESISTANT Resistant     CEFAZOLIN <=4 SENSITIVE Sensitive     CEFEPIME <=0.12 SENSITIVE Sensitive     CEFTRIAXONE <=0.25 SENSITIVE Sensitive     CIPROFLOXACIN >=4 RESISTANT Resistant     GENTAMICIN <=1 SENSITIVE Sensitive     IMIPENEM <=0.25 SENSITIVE Sensitive     NITROFURANTOIN <=16 SENSITIVE Sensitive     TRIMETH/SULFA <=20 SENSITIVE Sensitive     AMPICILLIN/SULBACTAM >=32 RESISTANT Resistant     PIP/TAZO <=4 SENSITIVE Sensitive     * >=100,000 COLONIES/mL ESCHERICHIA COLI  Culture, blood (Routine X 2) w Reflex to ID Panel     Status: None   Collection Time: 03/31/23  7:40 AM   Specimen: BLOOD  Result Value Ref Range Status   Specimen Description   Final    BLOOD LEFT ANTECUBITAL Performed at [facility], [address], [place], [place] [address]    Special Requests   Final    BOTTLES DRAWN AEROBIC AND ANAEROBIC Blood Culture adequate volume Performed at [facility], [address], [place], [place] [address]    Culture   Final    NO GROWTH 5 DAYS Performed at [facility], [address] [place], [place] [address]    Report Status 04/05/2023 FINAL  Final  MRSA Next Gen by PCR, Nasal     Status: None   Collection Time: 03/31/23  2:15 PM   Specimen: Nasal Mucosa; Nasal Swab  Result Value Ref Range Status   MRSA by PCR Next  Gen NOT DETECTED NOT DETECTED Final    Comment: (NOTE) The GeneXpert MRSA Assay (FDA approved for NASAL specimens only), is one component of a comprehensive MRSA colonization surveillance program. It is not intended to diagnose MRSA infection nor to guide or monitor treatment for MRSA infections. Test performance is not FDA approved in patients less than 24 years old. Performed at [facility], [address], [place], [place] [address]   Aerobic/Anaerobic Culture w Gram Stain (surgical/deep wound)     Status: None   Collection Time: 03/31/23  3:03 PM   Specimen: PATH Cytology Peritoneal fluid  Result Value Ref Range Status   Specimen Description   Final    PERITONEAL Performed at [facility], [address], [place], [place] [address]    Special Requests   Final    NONE Performed at [facility], [address], [place], [place] [address]    Gram Stain NO WBC SEEN NO ORGANISMS SEEN   Final   Culture   Final    No growth aerobically or  anaerobically. Performed at [facility], [address], [place], [place] [address]    Report Status 04/05/2023 FINAL  Final  Culture, blood (Routine X 2) w Reflex to ID Panel     Status: None (Preliminary result)   Collection Time: 04/01/23  8:29 AM   Specimen: BLOOD LEFT ARM  Result Value Ref Range Status   Specimen Description   Final    BLOOD LEFT ARM Performed at [facility], [address], [place], [place] [address]    Special Requests   Final    BOTTLES DRAWN AEROBIC AND ANAEROBIC Blood Culture adequate volume Performed at [facility], [address], [place], [place] [address]    Culture   Final    NO GROWTH 4 DAYS Performed at [facility], [address] [place], [place] [address]    Report Status PENDING  Incomplete  Culture, blood (Routine X 2) w Reflex to ID Panel     Status: None (Preliminary result)   Collection Time:  04/01/23  8:29 AM   Specimen: BLOOD LEFT FOREARM  Result Value Ref Range Status   Specimen Description   Final    BLOOD LEFT FOREARM Performed at [facility], [address], [place], [place] [address]    Special Requests   Final    BOTTLES DRAWN AEROBIC AND ANAEROBIC Blood Culture adequate volume Performed at [facility], [address], [place], [place] [address]    Culture   Final    NO GROWTH 4 DAYS Performed at [facility], [address] [place], [place] [address]    Report Status PENDING  Incomplete    Radiology Studies: No results found.  Scheduled Meds:  Chlorhexidine Gluconate Cloth  6 each Topical Daily   feeding supplement  237 mL Oral TID BM   folic acid  1 mg Oral Daily   lactulose  20 g Oral TID   multivitamin with minerals  1 tablet Oral Daily   nicotine  14 mg Transdermal Daily   pantoprazole (PROTONIX) IV  40 mg Intravenous Q12H   [START ON 04/06/2023] prednisoLONE  40 mg Oral Daily   thiamine  100 mg Oral Daily   Or   thiamine  100 mg Intravenous Daily    LOS: 5 days   Marguerita Merles, DO [facility] Available via Epic secure chat 7am-7pm After these hours, please refer to coverage provider listed on [email or web address] 04/05/2023, 8:07 PM

## 2023-04-06 DIAGNOSIS — J9601 Acute respiratory failure with hypoxia: Secondary | ICD-10-CM | POA: Diagnosis not present

## 2023-04-06 DIAGNOSIS — K7011 Alcoholic hepatitis with ascites: Secondary | ICD-10-CM | POA: Diagnosis not present

## 2023-04-06 DIAGNOSIS — K7200 Acute and subacute hepatic failure without coma: Secondary | ICD-10-CM | POA: Diagnosis not present

## 2023-04-06 DIAGNOSIS — K760 Fatty (change of) liver, not elsewhere classified: Secondary | ICD-10-CM | POA: Diagnosis not present

## 2023-04-06 DIAGNOSIS — F1010 Alcohol abuse, uncomplicated: Secondary | ICD-10-CM | POA: Diagnosis not present

## 2023-04-06 DIAGNOSIS — D539 Nutritional anemia, unspecified: Secondary | ICD-10-CM | POA: Diagnosis not present

## 2023-04-06 LAB — CBC WITH DIFFERENTIAL/PLATELET
Abs Immature Granulocytes: 0.15 10*3/uL — ABNORMAL HIGH (ref 0.00–0.07)
Basophils Absolute: 0.1 10*3/uL (ref 0.0–0.1)
Basophils Relative: 1 %
Eosinophils Absolute: 0.1 10*3/uL (ref 0.0–0.5)
Eosinophils Relative: 1 %
HCT: 36.7 % (ref 36.0–46.0)
Hemoglobin: 12.2 g/dL (ref 12.0–15.0)
Immature Granulocytes: 1 %
Lymphocytes Relative: 10 %
Lymphs Abs: 1.5 10*3/uL (ref 0.7–4.0)
MCH: 33.9 pg (ref 26.0–34.0)
MCHC: 33.2 g/dL (ref 30.0–36.0)
MCV: 101.9 fL — ABNORMAL HIGH (ref 80.0–100.0)
Monocytes Absolute: 2.2 10*3/uL — ABNORMAL HIGH (ref 0.1–1.0)
Monocytes Relative: 15 %
Neutro Abs: 10.7 10*3/uL — ABNORMAL HIGH (ref 1.7–7.7)
Neutrophils Relative %: 72 %
Platelets: 53 10*3/uL — ABNORMAL LOW (ref 150–400)
RBC: 3.6 MIL/uL — ABNORMAL LOW (ref 3.87–5.11)
RDW: 23.6 % — ABNORMAL HIGH (ref 11.5–15.5)
WBC: 14.6 10*3/uL — ABNORMAL HIGH (ref 4.0–10.5)
nRBC: 0 % (ref 0.0–0.2)

## 2023-04-06 LAB — COMPREHENSIVE METABOLIC PANEL
ALT: 85 U/L — ABNORMAL HIGH (ref 0–44)
AST: 89 U/L — ABNORMAL HIGH (ref 15–41)
Albumin: 2.2 g/dL — ABNORMAL LOW (ref 3.5–5.0)
Alkaline Phosphatase: 200 U/L — ABNORMAL HIGH (ref 38–126)
Anion gap: 7 (ref 5–15)
BUN: 26 mg/dL — ABNORMAL HIGH (ref 6–20)
CO2: 24 mmol/L (ref 22–32)
Calcium: 8 mg/dL — ABNORMAL LOW (ref 8.9–10.3)
Chloride: 98 mmol/L (ref 98–111)
Creatinine, Ser: <0.3 mg/dL — ABNORMAL LOW (ref 0.44–1.00)
Glucose, Bld: 100 mg/dL — ABNORMAL HIGH (ref 70–99)
Potassium: 4.4 mmol/L (ref 3.5–5.1)
Sodium: 129 mmol/L — ABNORMAL LOW (ref 135–145)
Total Bilirubin: 5.9 mg/dL — ABNORMAL HIGH (ref 0.3–1.2)
Total Protein: 5.5 g/dL — ABNORMAL LOW (ref 6.5–8.1)

## 2023-04-06 LAB — PROTIME-INR
INR: 1.3 — ABNORMAL HIGH (ref 0.8–1.2)
INR: 1.3 — ABNORMAL HIGH (ref 0.8–1.2)
Prothrombin Time: 16.8 seconds — ABNORMAL HIGH (ref 11.4–15.2)
Prothrombin Time: 16.3 seconds — ABNORMAL HIGH (ref 11.4–15.2)

## 2023-04-06 LAB — CBC
HCT: 36.9 % (ref 36.0–46.0)
Hemoglobin: 11.7 g/dL — ABNORMAL LOW (ref 12.0–15.0)
MCH: 33.6 pg (ref 26.0–34.0)
MCHC: 31.7 g/dL (ref 30.0–36.0)
MCV: 106 fL — ABNORMAL HIGH (ref 80.0–100.0)
Platelets: 60 10*3/uL — ABNORMAL LOW (ref 150–400)
RBC: 3.48 MIL/uL — ABNORMAL LOW (ref 3.87–5.11)
RDW: 23.8 % — ABNORMAL HIGH (ref 11.5–15.5)
WBC: 12.6 10*3/uL — ABNORMAL HIGH (ref 4.0–10.5)
nRBC: 0 % (ref 0.0–0.2)

## 2023-04-06 LAB — CULTURE, BLOOD (ROUTINE X 2)
Culture: NO GROWTH
Culture: NO GROWTH
Special Requests: ADEQUATE
Special Requests: ADEQUATE

## 2023-04-06 LAB — GLUCOSE, CAPILLARY
Glucose-Capillary: 87 mg/dL (ref 70–99)
Glucose-Capillary: 117 mg/dL — ABNORMAL HIGH (ref 70–99)
Glucose-Capillary: 185 mg/dL — ABNORMAL HIGH (ref 70–99)
Glucose-Capillary: 140 mg/dL — ABNORMAL HIGH (ref 70–99)

## 2023-04-06 LAB — PHOSPHORUS: Phosphorus: 2.1 mg/dL — ABNORMAL LOW (ref 2.5–4.6)

## 2023-04-06 LAB — AMMONIA
Ammonia: 35 umol/L (ref 9–35)
Ammonia: 25 umol/L (ref 9–35)

## 2023-04-06 LAB — MAGNESIUM: Magnesium: 1.9 mg/dL (ref 1.7–2.4)

## 2023-04-06 NOTE — Progress Notes (Signed)
PROGRESS NOTE    Christina Miller  ZOX:[id number] DOB: January 02, 1966 DOA: 04-13-23 PCP: Marcine Matar, MD   Brief Narrative:  Christina Miller is a 57 yo female with PMH alcohol abuse, suspected cirrhosis, hepatic steatosis, HTN, GERD, COPD who presented with abdominal fullness, intermittent fevers but no chills. She was having some N/V too. She reported a 12 pack beer daily on admission but also reports liquor use with further questioning too.  She was initially started on antibiotics due to concern for infection as well. Lab workup was consistent with alcoholic hepatitis after admission and she was initiated on steroids.  GI was also consulted on admission. Paracentesis was performed on admission removing 1 L fluid which was negative for SBP on cell count.  GI is following and has her on IV steroids and has not changed her to p.o. prednisone. Lille to be checked tomorrow.  Today she had some bloody stool in her rectum.  GI recommending repeating CBC and if continues to have some considering a CTA and have now stopped her lactulose.  Assessment and Plan:  Subacute Liver Failure -PT uptrended on 9/14. 16.3 >> 28.5 and INR as well 1.3 >> 2.7 -MELD-Na = 32 points as of 9/14 (~65% 90 day mortality) -Continue Vit K 10 mg daily x 3 days per GI rec's -Continue trending PT/INR and is now 16.8/1.3 -Very sensitive to opoids and benzos causing decreased mentation/lethargy etc, need to be judicious with use; starting to develop some signs of etoh w/d on 9/16 -Ammonia was elevated but now lactulose is being discontinued given below   Alcoholic Hepatitis with Ascites Hyperbilirubinemia - MDF 32 on admission and now up to 88 pts on calculation 9/14 -Continue solu-medrol; waxing/waning PO intake at times; mentation finally more consistently improved; agree with changing to oral steroid 9/18 per GI -LFT Trend and Bilirubin Trend: Recent Labs  Lab 03/31/23 0019 04/01/23 0614 04/02/23 0256  04/03/23 0259 04/04/23 0425 04/05/23 0430 04/06/23 0510  AST 311* 731* 560* 370* 187* 114* 89*  ALT 109* 144* 156* 144* 117* 96* 85*   Recent Labs  Lab 03/31/23 0019 04/01/23 0614 04/02/23 0256 04/03/23 0259 04/04/23 0425 04/05/23 0430 04/06/23 0510  BILITOT 12.2* 13.0* 14.5* 11.8* 7.4* 5.9* 5.9*  -High mortality overall in her clinical context; have discussed prognosis with patient and family -Plan for Lillie score at day 7 (03/25/2023)  -Today is Day 6 of Solumedrol 40 mg for Alcoholic Hepatitis and this has not been changed to prednisolone 40 mg daily)   Acute Respiratory Failure with Hypoxia (HCC) -Increased oral secretions noted 9/15 and worsening mentation requiring oral suctioning; at risk for aspiration. Also s/p IVF on admission and PRBC in setting of hypoalbuminemia so 3rd spacing a risk as well -CXR repeated 9/15 shows worsening L effusion as expected; no signs of aspiration at this time yet SpO2: 96 % O2 Flow Rate (L/min): 6 L/min -Holding off on Lasix for now given concern for intravascular depletion at this time and may worsen renal function and she is not an HD candidate  -If develops fever will add flagyl on with Rocephin for aspiration coverage; trending WBC further still (remains elevated but improved from admission)   Alcohol Abuse -continue CIWA protocol; caution with ativan unless unless absolutely needed -last drink 9/13 -drinks reportedly 12 pack beer daily and liquor use possibly up to 1/2 pint daily -ETOH level 158 on admission    Acute Hepatic Encephalopathy (HCC) -See liver failure also -Some of mentation worsening may have  been due to ativan use during some w/d symptoms; overall mentation has cleared; no further concern for asterixis also -NH3 has downtrended -Lactulose is being stopped as below   Hyponatremia -Na 118 on admission; asymptomatic; some chronic component expected in setting of alcohol use and presumed cirrhosis -Suspected combo of  alcohol abuse and poor intake -s/p IVF on admission and has improved and remaining stable  -Na+ Trend: Recent Labs  Lab 04/01/23 0614 04/01/23 0728 04/02/23 0256 04/03/23 0259 04/04/23 0425 04/05/23 0430 04/06/23 0510  NA 121* 123* 125* 127* 127* 128* 129*  -Continue Trending -Appetite and mentation slowly improving   Acute Cystitis -See abdominal pain as well -UA suggestive of infection.  Urine culture growing E. coli.  Reviewed sens -She is completed IV ceftriaxone for 5 days with her last day being 04/05/2023   Abdominal Pain, Generalized -Suspect from small body habitus and distension from ascites -S/p paracentesis on 03/31/2023 removing 1 L fluid -Fluid studies negative for SBP -initial WBC 21.7 and downtrend and now trend shows: Recent Labs  Lab 04/01/23 0614 04/02/23 0256 04/03/23 0259 04/04/23 0425 04/05/23 0430 04/06/23 0510 04/06/23 1735  WBC 13.2* 13.3* 12.3* 12.7* 13.3* 14.6* 12.6*  -Blood cultures in process -UA possibly consistent with UTI -De-escalate abx back down to Rocephin and will cover urine for now; no other obvious source infection at this time; urine culture growing E. coli, sensitivities reviewed  Metabolic Acidosis -Improved as she has a CO2 of 24, anion gap of 7, chloride level of 98.   Macrocytic Anemia -Presumed bone marrow suppression from excessive alcohol abuse -Folate also low, 2.8 -B12 adequate, 868 -Continue folic acid and multivitamin -Hgb/Hct Trend: Recent Labs  Lab 04/01/23 1806 04/02/23 0256 04/03/23 0259 04/04/23 0425 04/05/23 0430 04/06/23 0510 04/06/23 1735  HGB 10.5* 11.6* 12.6 11.3* 11.3* 12.2 11.7*  HCT 31.0* 34.1* 37.0 33.8* 34.6* 36.7 36.9  MCV  --  98.8 101.4* 103.0* 103.6* 101.9* 106.0*  -Hgb down from 8.5 g/dL on admission to 5.2 g/dL on 4/78.  Possibly hemodilution as received fluid resuscitation since admission due to hypotension.  FOBT negative -Transfused 2 units PRBC on 9/14 and now Hgb 11.6 g/dL  yesterday (question the validity of the 5.2 g/dL on 2/95, may have been lab error in retrospect) -Had rusty colored watery output from her rectum and GI performed a rectal exam which revealed rusty colored output.  GI recommending repeat CBC and rectal output within an hour if she has a large amount regular recommend considering CTA but there are stopping her lactulose -Continue to monitor trend and repeat CBC in a.m.   Abnormal CT scan, colon - CT notes thickened walls of cecum and ascending colon along with mucosal enhancement and thickening of walls of distal esophagus and gastric folds -Findings may be in setting of inflammatory context -GI following as well.  She may at some point need further endoscopy evaluation when more stable    Hypotension-resolved as of 04/03/2023 -Lactic elevated in setting of liver failure more than due to sepsis -Accept MAP>60 in setting of liver disease  -S/p IVF; will get further volume expansion with PRBC  Thrombocytopenia -Platelet Count Trend: Recent Labs  Lab 04/01/23 0614 04/02/23 0256 04/03/23 0259 04/04/23 0425 04/05/23 0430 04/06/23 0510 04/06/23 1735  PLT 139* 128* 100* 68* 51* 53* 60*  -Continue to Monitor and Trend Platelet Count Trend and repeat CBC in the AM    Esophageal Thickening -See abnormal CT   Multiple sclerosis (HCC) -Not on treatment  Hypoalbuminemia -Patient's Albumin Trend: Recent Labs  Lab 03/31/23 0019 04/01/23 0614 04/02/23 0256 04/03/23 0259 04/04/23 0425 04/05/23 0430 04/06/23 0510  ALBUMIN 2.3* 2.9* 2.7* 2.5* 2.1* 2.0* 2.2*  -Continue to Monitor and Trend and repeat CMP in the AM   DVT prophylaxis: SCDs Start: 03/31/23 0729    Code Status: Full Code Family Communication: No family currently at bedside  Disposition Plan:  Level of care: Progressive Status is: Inpatient Remains inpatient appropriate because: Needs further clinical improvement and clearance by GI   Consultants:   Gastroenterology  Procedures:  As delineated as above  Antimicrobials:  Anti-infectives (From admission, onward)    Start     Dose/Rate Route Frequency Ordered Stop   04/03/23 0400  vancomycin (VANCOREADY) IVPB 750 mg/150 mL  Status:  Discontinued        750 mg 150 mL/hr over 60 Minutes Intravenous Every 48 hours 04/01/23 0307 04/01/23 1238   04/01/23 1330  cefTRIAXone (ROCEPHIN) 2 g in sodium chloride 0.9 % 100 mL IVPB        2 g 200 mL/hr over 30 Minutes Intravenous Daily 04/01/23 1316 04/05/23 1102   04/01/23 0900  cefTRIAXone (ROCEPHIN) 2 g in sodium chloride 0.9 % 100 mL IVPB  Status:  Discontinued        2 g 200 mL/hr over 30 Minutes Intravenous Every 24 hours 03/31/23 0843 04/01/23 0241   04/01/23 0330  vancomycin (VANCOREADY) IVPB 750 mg/150 mL        750 mg 150 mL/hr over 60 Minutes Intravenous  Once 04/01/23 0233 04/01/23 0500   04/01/23 0300  ceFEPIme (MAXIPIME) 2 g in sodium chloride 0.9 % 100 mL IVPB  Status:  Discontinued        2 g 200 mL/hr over 30 Minutes Intravenous Every 12 hours 04/01/23 0233 04/01/23 1238   03/31/23 2200  metroNIDAZOLE (FLAGYL) IVPB 500 mg  Status:  Discontinued        500 mg 100 mL/hr over 60 Minutes Intravenous Every 12 hours 03/31/23 1402 04/01/23 1238   03/31/23 0800  cefTRIAXone (ROCEPHIN) 2 g in sodium chloride 0.9 % 100 mL IVPB  Status:  Discontinued        2 g 200 mL/hr over 30 Minutes Intravenous Every 24 hours 03/31/23 0727 03/31/23 0843   03/31/23 0515  metroNIDAZOLE (FLAGYL) IVPB 500 mg  Status:  Discontinued        500 mg 100 mL/hr over 60 Minutes Intravenous Every 8 hours 03/31/23 0513 03/31/23 1402   03/31/23 0500  cefTRIAXone (ROCEPHIN) 1 g in sodium chloride 0.9 % 100 mL IVPB        1 g 200 mL/hr over 30 Minutes Intravenous  Once 03/31/23 0448 03/31/23 0708       Subjective: Seen and examined at bedside and she was doing okay.  Nursing had reported that she had some dark bloody stool in her rectal pouch.  GI is now  checking a CBC and considering a CTA if she continues to have some more.  Patient denies any complaints and no nausea or vomiting.  Objective: Vitals:   04/06/23 0609 04/06/23 0830 04/06/23 1215 04/06/23 2057  BP: (!) 120/98 (!) 119/92 110/83 123/84  Pulse: (!) 104 (!) 110 (!) 108 (!) 103  Resp: 16  20 16   Temp: 98.4 F (36.9 C) 97.8 F (36.6 C) 97.9 F (36.6 C) 97.8 F (36.6 C)  TempSrc: Oral Oral Oral Oral  SpO2: 97% 97% 95% 96%  Weight:  Height:        Intake/Output Summary (Last 24 hours) at 04/06/2023 2107 Last data filed at 04/06/2023 0500 Gross per 24 hour  Intake 240 ml  Output 100 ml  Net 140 ml   Filed Weights   04/04/23 0500 04/05/23 0512 04/06/23 0500  Weight: 48.5 kg 49.7 kg 49.1 kg   Examination: Physical Exam:  Constitutional: Thin chronically ill-appearing Caucasian female no acute distress Respiratory: Diminished to auscultation bilaterally, no wheezing, rales, rhonchi or crackles. Normal respiratory effort and patient is not tachypenic. No accessory muscle use.  Unlabored breathing Cardiovascular: RRR, no murmurs / rubs / gallops. S1 and S2 auscultated. No extremity edema.  Abdomen: Soft, non-tender, non-distended. Bowel sounds positive.  GU: Deferred. Musculoskeletal: No clubbing / cyanosis of digits/nails. No joint deformity upper and lower extremities.  Skin: No rashes, lesions, ulcers on limited skin evaluation but does have some bruising and ecchymosis noted. No induration; Warm and dry.  Neurologic: CN 2-12 grossly intact with no focal deficits. Romberg sign and cerebellar reflexes not assessed.  Psychiatric: Normal judgment and insight. Alert and oriented x 3. Normal mood and appropriate affect.   Data Reviewed: I have personally reviewed following labs and imaging studies  CBC: Recent Labs  Lab 04/02/23 0256 04/03/23 0259 04/04/23 0425 04/05/23 0430 04/06/23 0510 04/06/23 1735  WBC 13.3* 12.3* 12.7* 13.3* 14.6* 12.6*  NEUTROABS 11.5*  10.2* 10.1* 10.1* 10.7*  --   HGB 11.6* 12.6 11.3* 11.3* 12.2 11.7*  HCT 34.1* 37.0 33.8* 34.6* 36.7 36.9  MCV 98.8 101.4* 103.0* 103.6* 101.9* 106.0*  PLT 128* 100* 68* 51* 53* 60*   Basic Metabolic Panel: Recent Labs  Lab 04/02/23 0256 04/03/23 0259 04/04/23 0425 04/05/23 0430 04/06/23 0510  NA 125* 127* 127* 128* 129*  K 4.1 4.1 3.6 3.8 4.4  CL 94* 96* 98 99 98  CO2 20* 21* 23 21* 24  GLUCOSE 112* 117* 125* 119* 100*  BUN 24* 25* 24* 24* 26*  CREATININE 0.46 <0.30* 0.34* <0.30* <0.30*  CALCIUM 7.7* 8.2* 7.8* 7.7* 8.0*  MG 1.7 2.0 2.1 1.9 1.9  PHOS  --   --   --   --  2.1*   GFR: CrCl cannot be calculated (This lab value cannot be used to calculate CrCl because it is not a number: <0.30). Liver Function Tests: Recent Labs  Lab 04/02/23 0256 04/03/23 0259 04/04/23 0425 04/05/23 0430 04/06/23 0510  AST 560* 370* 187* 114* 89*  ALT 156* 144* 117* 96* 85*  ALKPHOS 201* 203* 203* 200* 200*  BILITOT 14.5* 11.8* 7.4* 5.9* 5.9*  PROT 5.6* 5.6* 5.0* 5.1* 5.5*  ALBUMIN 2.7* 2.5* 2.1* 2.0* 2.2*   Recent Labs  Lab 03/31/23 0019  LIPASE 31   Recent Labs  Lab 03/31/23 0019 04/01/23 1806 04/03/23 0259 04/06/23 0510  AMMONIA 44* 74* 52* 35   Coagulation Profile: Recent Labs  Lab 04/02/23 0256 04/03/23 0259 04/04/23 0425 04/05/23 0430 04/06/23 0510  INR 2.3* 1.9* 1.6* 1.5* 1.3*   Cardiac Enzymes: No results for input(s): "CKTOTAL", "CKMB", "CKMBINDEX", "TROPONINI" in the last 168 hours. BNP (last 3 results) No results for input(s): "PROBNP" in the last 8760 hours. HbA1C: No results for input(s): "HGBA1C" in the last 72 hours. CBG: Recent Labs  Lab 04/05/23 2045 04/06/23 0732 04/06/23 1200 04/06/23 1727 04/06/23 2059  GLUCAP 176* 87 117* 185* 140*   Lipid Profile: No results for input(s): "CHOL", "HDL", "LDLCALC", "TRIG", "CHOLHDL", "LDLDIRECT" in the last 72 hours. Thyroid Function Tests: No results  for input(s): "TSH", "T4TOTAL", "FREET4",  "T3FREE", "THYROIDAB" in the last 72 hours. Anemia Panel: No results for input(s): "VITAMINB12", "FOLATE", "FERRITIN", "TIBC", "IRON", "RETICCTPCT" in the last 72 hours. Sepsis Labs: Recent Labs  Lab 03/31/23 0311 03/31/23 2004 04/01/23 1335 04/02/23 0256  LATICACIDVEN 8.6* >9.0* 2.5* 1.9    Recent Results (from the past 240 hour(s))  Culture, blood (Routine X 2) w Reflex to ID Panel     Status: None   Collection Time: 03/31/23  5:36 AM   Specimen: BLOOD  Result Value Ref Range Status   Specimen Description   Final    BLOOD LEFT ANTECUBITAL Performed at [facility], [address], [place], [place] [address]    Special Requests   Final    BOTTLES DRAWN AEROBIC AND ANAEROBIC Blood Culture results may not be optimal due to an excessive volume of blood received in culture bottles Performed at [facility], [address] [place], [place] [address]    Culture   Final    NO GROWTH 5 DAYS Performed at [facility], [address] [place], [place] [address]    Report Status 04/05/2023 FINAL  Final  Urine Culture     Status: Abnormal   Collection Time: 03/31/23  6:36 AM   Specimen: Urine, Random  Result Value Ref Range Status   Specimen Description   Final    URINE, RANDOM Performed at [facility], [address], [place], [place] [address]    Special Requests   Final    NONE Reflexed from [id number] Performed at [facility], [address], [place], [place] [address]    Culture >=100,000 COLONIES/mL ESCHERICHIA COLI (A)  Final   Report Status 04/02/2023 FINAL  Final   Organism ID, Bacteria ESCHERICHIA COLI (A)  Final      Susceptibility   Escherichia coli - MIC*    AMPICILLIN >=32 RESISTANT Resistant     CEFAZOLIN <=4 SENSITIVE Sensitive     CEFEPIME <=0.12 SENSITIVE Sensitive     CEFTRIAXONE <=0.25 SENSITIVE Sensitive     CIPROFLOXACIN >=4 RESISTANT Resistant     GENTAMICIN <=1 SENSITIVE  Sensitive     IMIPENEM <=0.25 SENSITIVE Sensitive     NITROFURANTOIN <=16 SENSITIVE Sensitive     TRIMETH/SULFA <=20 SENSITIVE Sensitive     AMPICILLIN/SULBACTAM >=32 RESISTANT Resistant     PIP/TAZO <=4 SENSITIVE Sensitive     * >=100,000 COLONIES/mL ESCHERICHIA COLI  Culture, blood (Routine X 2) w Reflex to ID Panel     Status: None   Collection Time: 03/31/23  7:40 AM   Specimen: BLOOD  Result Value Ref Range Status   Specimen Description   Final    BLOOD LEFT ANTECUBITAL Performed at [facility], [address] [place], [place] [address]    Special Requests   Final    BOTTLES DRAWN AEROBIC AND ANAEROBIC Blood Culture adequate volume Performed at [facility], [address], [place], [place] [address]    Culture   Final    NO GROWTH 5 DAYS Performed at [facility], [address] [place], [place] [address]    Report Status 04/05/2023 FINAL  Final  MRSA Next Gen by PCR, Nasal     Status: None   Collection Time: 03/31/23  2:15 PM   Specimen: Nasal Mucosa; Nasal Swab  Result Value Ref Range Status   MRSA by PCR Next Gen NOT DETECTED NOT DETECTED Final  Comment: (NOTE) The GeneXpert MRSA Assay (FDA approved for NASAL specimens only), is one component of a comprehensive MRSA colonization surveillance program. It is not intended to diagnose MRSA infection nor to guide or monitor treatment for MRSA infections. Test performance is not FDA approved in patients less than 42 years old. Performed at [facility], [address], [place], [place] [address]   Aerobic/Anaerobic Culture w Gram Stain (surgical/deep wound)     Status: None   Collection Time: 03/31/23  3:03 PM   Specimen: PATH Cytology Peritoneal fluid  Result Value Ref Range Status   Specimen Description   Final    PERITONEAL Performed at [facility], [address] [place], [place] [address]    Special Requests   Final     NONE Performed at [facility], [address] [place], [place] [address]    Gram Stain NO WBC SEEN NO ORGANISMS SEEN   Final   Culture   Final    No growth aerobically or anaerobically. Performed at [facility], [address], [place], [place] [address]    Report Status 04/05/2023 FINAL  Final  Culture, blood (Routine X 2) w Reflex to ID Panel     Status: None   Collection Time: 04/01/23  8:29 AM   Specimen: BLOOD LEFT ARM  Result Value Ref Range Status   Specimen Description   Final    BLOOD LEFT ARM Performed at [facility], [address], [place], [place] [address]    Special Requests   Final    BOTTLES DRAWN AEROBIC AND ANAEROBIC Blood Culture adequate volume Performed at [facility], [address], [place], [place] [address]    Culture   Final    NO GROWTH 5 DAYS Performed at [facility], [address] [place], [place] [address]    Report Status 04/06/2023 FINAL  Final  Culture, blood (Routine X 2) w Reflex to ID Panel     Status: None   Collection Time: 04/01/23  8:29 AM   Specimen: BLOOD LEFT FOREARM  Result Value Ref Range Status   Specimen Description   Final    BLOOD LEFT FOREARM Performed at [facility], [address] [place], [place] [address]    Special Requests   Final    BOTTLES DRAWN AEROBIC AND ANAEROBIC Blood Culture adequate volume Performed at [facility], [address] [place], [place] [address]    Culture   Final    NO GROWTH 5 DAYS Performed at [facility], [address], [place], [place] [address]    Report Status 04/06/2023 FINAL  Final    Radiology Studies: No results found.  Scheduled Meds:  Chlorhexidine Gluconate Cloth  6 each Topical Daily   feeding supplement  237 mL Oral TID BM   folic acid  1 mg Oral Daily   multivitamin with minerals  1 tablet Oral Daily   nicotine  14 mg Transdermal Daily   pantoprazole  (PROTONIX) IV  40 mg Intravenous Q12H   prednisoLONE  40 mg Oral Daily   thiamine  100 mg Oral Daily   Or   thiamine  100 mg Intravenous Daily   Continuous Infusions:   LOS: 6 days   Marguerita Merles, DO [facility] Available via Epic secure chat 7am-7pm After these hours, please refer to coverage provider listed on [email or web address] 04/06/2023, 9:07 PM

## 2023-04-06 NOTE — Plan of Care (Signed)

## 2023-04-06 NOTE — Progress Notes (Signed)
Progress brief narrative: Examined patient who had collection bag with 200-365ml rusty colored watery output per rectum. Removed bag and performed rectal exam which revealed rusty colored output. Enlarged labia, per RN has improved over the course of the last few days. Vital signs stable. Pt does not report abdominal or rectal pain. Will check CBC now. Recheck rectal output in 1 hour, and if large amount consider CTA. Stopped lactulose.  1 hour later, Per RN patient is eating, no output in bag. She will notify me if there is any changes. Pending CBC results.  Morgana Rowley, NP-C

## 2023-04-06 NOTE — TOC Progression Note (Signed)
Transition of Care [facility]) - Progression Note    Patient Details  Name: Christina Miller MRN: [id number] Date of Birth: [birth year]  Transition of Care [facility]) CM/SW Contact  Darleene Cleaver, [place] Phone Number: 04/06/2023, 7:16 PM  Clinical Narrative:    Patient plans to return back home once medically ready for discharge.     Barriers to Discharge: Continued Medical Work up  Expected Discharge Plan and Services                                               Social Determinants of Health (SDOH) Interventions SDOH Screenings   Food Insecurity: No Food Insecurity (04/01/2023)  Housing: Low Risk  (04/01/2023)  Transportation Needs: No Transportation Needs (04/01/2023)  Utilities: Not At Risk (04/01/2023)  Depression (PHQ2-9): Low Risk  (04/02/2020)  Tobacco Use: High Risk (03/31/2023)    Readmission Risk Interventions     No data to display

## 2023-04-06 NOTE — Progress Notes (Addendum)
Progress Note  Primary GI: Dr. Lavon Paganini  LOS: 6 days   Chief Complaint:elevated LFT's   Subjective   Patient lying in bed. A&Ox3. States she slept much better last night.  No complaints today.  She did eat some of her breakfast along with a Ensure.  Reports some pyrosis this morning.  Denies abdominal pain, nausea, or vomiting. Asterixis was not noted today. On Room air.  Flexi-Seal bag in place with no output in drainage bag. Patient does enquire about PT/OT to help build strength before she goes home.  No family was present at the time of my evaluation.   Objective   Vital signs in last 24 hours: Temp:  [97.6 F (36.4 C)-98.4 F (36.9 C)] 98.4 F (36.9 C) (09/19 0609) Pulse Rate:  [104-107] 104 (09/19 0609) Resp:  [15-18] 16 (09/19 0609) BP: (112-136)/(86-98) 120/98 (09/19 0609) SpO2:  [95 %-97 %] 97 % (09/19 0609) Weight:  [49.1 kg] 49.1 kg (09/19 0500) Last BM Date : 04/06/23 Last BM recorded by nurses in past 5 days Stool Type: Type 7 (Liquid consistency with no solid pieces) (04/06/2023  5:00 AM)  General: frail, malnourished  female in no acute distress  Heart:  Regular rate and rhythm; no murmurs Pulm: diminished lung sounds, wheezing noted upper lobes , on RA  Abdomen: soft, nondistended, normal bowel sounds in all quadrants. Nontender without guarding. No organomegaly appreciated. Flexi seal bag noted. No output. Extremities:  Trace edema noted BLE Neurologic:  Alert and  oriented x4;  No focal deficits. Asterixis was not noted today.  Psych:  Cooperative. Normal mood and affect.  Intake/Output from previous day: 09/18 0701 - 09/19 0700 In: 480 [P.O.:480] Out: 450 [Urine:100; Stool:350] Intake/Output this shift: No intake/output data recorded.  Studies/Results: No results found.  Lab Results: Recent Labs    04/04/23 0425 04/05/23 0430 04/06/23 0510  WBC 12.7* 13.3* 14.6*  HGB 11.3* 11.3* 12.2  HCT 33.8* 34.6* 36.7  PLT 68* 51* 53*   BMET Recent  Labs    04/04/23 0425 04/05/23 0430 04/06/23 0510  NA 127* 128* 129*  K 3.6 3.8 4.4  CL 98 99 98  CO2 23 21* 24  GLUCOSE 125* 119* 100*  BUN 24* 24* 26*  CREATININE 0.34* <0.30* <0.30*  CALCIUM 7.8* 7.7* 8.0*   LFT Recent Labs    04/06/23 0510  PROT 5.5*  ALBUMIN 2.2*  AST 89*  ALT 85*  ALKPHOS 200*  BILITOT 5.9*   PT/INR Recent Labs    04/05/23 0430 04/06/23 0510  LABPROT 18.3* 16.8*  INR 1.5* 1.3*     Scheduled Meds:  Chlorhexidine Gluconate Cloth  6 each Topical Daily   feeding supplement  237 mL Oral TID BM   folic acid  1 mg Oral Daily   lactulose  20 g Oral TID   multivitamin with minerals  1 tablet Oral Daily   nicotine  14 mg Transdermal Daily   pantoprazole (PROTONIX) IV  40 mg Intravenous Q12H   prednisoLONE  40 mg Oral Daily   thiamine  100 mg Oral Daily   Or   thiamine  100 mg Intravenous Daily   Continuous Infusions:   Patient narrative:  57 y.o. year old female with GERD, hypertension, alcohol use disorder, MS (2003),COPD, diverticulosis complicated with perforated sigmoid diverticulitis with complex abscess (2019). Presents presents to the emergency department stating that she has been experiencing diffuse abdominal pain, nausea and vomiting for a month. GI consulted for elevated LFT's.  Impression/Plan:  Elevated LFT's. 57 year old female with significant alcohol use disorder who presented with abd pain/ascites/anasarca and findings of deep jaundice. She has entered this hospital with alcoholic liver disease.  She does have some telangiectasias although her imaging does not show any splenomegaly and she had normal PLT count on admission that has now trended downward to 53 from 272. Treatment for alcoholic hepatitis started on Sat 9/14 when her discriminant function was recalculated when her INR more than doubled from Fri into Sat. Wynelle Link 9/15 she began withdrawing, CIWA protocol initiated. Much improved last three days. Patient oriented and alert  x3. No signs of hepatic encephalopathy. Ammonia level normal. Abd U/S shows Portal vein is patent on color Doppler imaging with bidirectional flow. Hepatic steatosis with mildly nodular liver contour which could indicate cirrhosis w/ bidirectional portal vein flow. And ascites. CT Abd/pelvis shows hepatic steatosis.No biliary ductal dilatation. The gallbladder is without stones. Moderate to large ascites. Spleen normal in size.  Paracentesis  (9/13) where 1 L removed and negative for SBP on tap.    Day 6 of steroids, will calculate Lille on day 7 (Sept 20th)  to evaluate if her alcohol hepatitis is responding to therapy.  MELD 3.0: 23 at 9/192024 MELD-Na: 22 at 04/06/2023  Albumin 2.3 > 2.1>2.0>2.2        Ammonia 44>74>52>35             AFP 2.4 Trending downward :AST (311)>89, ALT (109)>85,  alk Phosp (335)>200     Total Bili 14.5>5.9 PT 16.3>19.1>18.3>16.8  INR 1.3>1.6>1.5>1.3 -Daily INR/ PT -On CIWA protocol -Continue steroid therapy (changed to po) -will calculate Lille on day 7 (Sept 20th)  -Continue regular diet -Continue lactulose for now, titrate to 2-3 bowel movements/daily. -Continue to monitor neuro status closely.  -Recommend PT/OT before discharge   Abdominal pain- lower abdominal pain for 1 mth. Pt reports history of constipation with intermittent loose stools. Has not required pain medication since 9/13. CT showed thickening of the walls of the cecum and ascending colon,likely related to ascites. -pain management per hospitalist. -Continue Lactulose for now, titrate 2-3 bowel movements/day   Leukocytosis. WBC 21.7>12.7>13.3>14.6. Lactic acid (9/15)1.9. Afebrile. Blood cultures negative. Urine cultures positive Ecoli. On rocephin IV. Paracentesis negative SBP. Small bilateral pleural effusions on cxray 9/13.Cytology shows reactive mesothelial cells present.   Macrocytosis preceding anemia. Hgb (8.5) >12.2. MCV 101.9. Negative fecal occult blood. PLT 272>139>68>51>53. Pt reported  1 episode of black tarry stool 3 weeks ago. No overt bleeding at this time. (9/13) B12- 868. Folate 2.8. Iron panel 90. - replete per protocol   Hyponatremia 129. Creat <0.30. BUN 26 -continue to monitor   GERD. Patient reports history of GERD and takes OTC Pepcid prn. No EGD. CT abd/pelvis shows Mucosal enhancement with thickening of the walls of the distal esophagus and gastric folds suggesting gastritis/esophagitis. Patient reports GERD symptoms improved with twice daily regimen. -Continue Pantoprazole 40mg  IV twice daily -Consider endoscopic evaluation during this hospitalization when stabilized   COPD - on home albuterol, on RA today.    Hx of tubular adenoma colonic polyps- overdue for colonoscopy by 1 year. Elective outpatient colonoscopy with Dr. Lavon Paganini.    History of diverticulitis, S/P sigmoidectomy in 2020.    Additional co-morbidities include : hypertension - on amlodipine, MS - not on treatment  Christina Miller  04/06/2023, 8:32 AM    Attending Physician Note   I have taken an interval history, reviewed the chart and examined the patient. I performed  a substantive portion of this encounter, including complete performance of at least one of the key components, in conjunction with the APP. I agree with the APP's note, impression and recommendations with my edits. My additional impressions and recommendations are as follows.   Alcoholic hepatitis with ascites, hepatic steatosis, possible underlying cirrhosis, alcohol withdrawal. LFTs and INR improved. Mental status improved. Day 6 of corticosteroids, currently on prednisolone 40 mg every day, for alcoholic hepatitis. Lillie score tomorrow. Continue Rocephin and CIWA protocol. Continue lactulose for now however asterixis was not noted again today so it is not clear that she has HE. Mental status changes could be withdrawal related.   Macrocytic anemia. Alcohol related and folate deficient. Replace folate.    Thrombocytopenia,  worsening, 53k today.    GERD likely with esophagitis and probable gastritis leading to distal esophageal wall and gastric wall thickening on CT. Continue pantoprazole 40 mg IV q12h and change to PO tomorrow if she continues to take PO adequately. Consider EGD when stabilized.    Cecum, ascending colon wall thickening on CT - likely related to ascites   Malnutrition    Acute respiratory failure with hypoxemia and COPD   Hyponatremia, improved    MS   History of diverticulitis, S/P sigmoidectomy in 2020.    Personal history of adenomatous colon polyps. Elective outpatient colonoscopy with Dr. Lavon Paganini.    Claudette Head, MD [facility] See Loretha Stapler, [facility], for our on call provider

## 2023-04-07 ENCOUNTER — Inpatient Hospital Stay (HOSPITAL_COMMUNITY): Payer: Medicaid Other

## 2023-04-07 DIAGNOSIS — E861 Hypovolemia: Secondary | ICD-10-CM | POA: Diagnosis not present

## 2023-04-07 DIAGNOSIS — K7031 Alcoholic cirrhosis of liver with ascites: Secondary | ICD-10-CM | POA: Diagnosis not present

## 2023-04-07 DIAGNOSIS — R578 Other shock: Secondary | ICD-10-CM

## 2023-04-07 DIAGNOSIS — K921 Melena: Secondary | ICD-10-CM | POA: Diagnosis not present

## 2023-04-07 DIAGNOSIS — F1010 Alcohol abuse, uncomplicated: Secondary | ICD-10-CM | POA: Diagnosis not present

## 2023-04-07 DIAGNOSIS — K922 Gastrointestinal hemorrhage, unspecified: Secondary | ICD-10-CM | POA: Diagnosis not present

## 2023-04-07 DIAGNOSIS — K7011 Alcoholic hepatitis with ascites: Secondary | ICD-10-CM | POA: Diagnosis not present

## 2023-04-07 DIAGNOSIS — K7200 Acute and subacute hepatic failure without coma: Secondary | ICD-10-CM | POA: Diagnosis not present

## 2023-04-07 DIAGNOSIS — J9601 Acute respiratory failure with hypoxia: Secondary | ICD-10-CM | POA: Diagnosis not present

## 2023-04-07 HISTORY — PX: IR ANGIOGRAM EXTREMITY BILATERAL: IMG653

## 2023-04-07 HISTORY — PX: IR ANGIOGRAM SELECTIVE EACH ADDITIONAL VESSEL: IMG667

## 2023-04-07 HISTORY — PX: IR ANGIOGRAM VISCERAL SELECTIVE: IMG657

## 2023-04-07 HISTORY — PX: IR US GUIDE VASC ACCESS RIGHT: IMG2390

## 2023-04-07 HISTORY — PX: IR EMBO ART  VEN HEMORR LYMPH EXTRAV  INC GUIDE ROADMAPPING: IMG5450

## 2023-04-07 LAB — CBC WITH DIFFERENTIAL/PLATELET
Abs Immature Granulocytes: 0.13 10*3/uL — ABNORMAL HIGH (ref 0.00–0.07)
Basophils Absolute: 0.1 10*3/uL (ref 0.0–0.1)
Basophils Relative: 1 %
Eosinophils Absolute: 0.1 10*3/uL (ref 0.0–0.5)
Eosinophils Relative: 1 %
HCT: 33.2 % — ABNORMAL LOW (ref 36.0–46.0)
Hemoglobin: 10.7 g/dL — ABNORMAL LOW (ref 12.0–15.0)
Immature Granulocytes: 1 %
Lymphocytes Relative: 11 %
Lymphs Abs: 1.5 10*3/uL (ref 0.7–4.0)
MCH: 34.3 pg — ABNORMAL HIGH (ref 26.0–34.0)
MCHC: 32.2 g/dL (ref 30.0–36.0)
MCV: 106.4 fL — ABNORMAL HIGH (ref 80.0–100.0)
Monocytes Absolute: 1.7 10*3/uL — ABNORMAL HIGH (ref 0.1–1.0)
Monocytes Relative: 12 %
Neutro Abs: 10.5 10*3/uL — ABNORMAL HIGH (ref 1.7–7.7)
Neutrophils Relative %: 74 %
Platelets: 55 10*3/uL — ABNORMAL LOW (ref 150–400)
RBC: 3.12 MIL/uL — ABNORMAL LOW (ref 3.87–5.11)
RDW: 23.3 % — ABNORMAL HIGH (ref 11.5–15.5)
WBC: 14 10*3/uL — ABNORMAL HIGH (ref 4.0–10.5)
nRBC: 0 % (ref 0.0–0.2)

## 2023-04-07 LAB — COMPREHENSIVE METABOLIC PANEL
ALT: 74 U/L — ABNORMAL HIGH (ref 0–44)
AST: 75 U/L — ABNORMAL HIGH (ref 15–41)
Albumin: 1.9 g/dL — ABNORMAL LOW (ref 3.5–5.0)
Alkaline Phosphatase: 169 U/L — ABNORMAL HIGH (ref 38–126)
Anion gap: 6 (ref 5–15)
BUN: 26 mg/dL — ABNORMAL HIGH (ref 6–20)
CO2: 24 mmol/L (ref 22–32)
Calcium: 7.7 mg/dL — ABNORMAL LOW (ref 8.9–10.3)
Chloride: 98 mmol/L (ref 98–111)
Creatinine, Ser: 0.31 mg/dL — ABNORMAL LOW (ref 0.44–1.00)
GFR, Estimated: >60 mL/min (ref >60)
Glucose, Bld: 82 mg/dL (ref 70–99)
Potassium: 4.2 mmol/L (ref 3.5–5.1)
Sodium: 128 mmol/L — ABNORMAL LOW (ref 135–145)
Total Bilirubin: 5.2 mg/dL — ABNORMAL HIGH (ref 0.3–1.2)
Total Protein: 4.9 g/dL — ABNORMAL LOW (ref 6.5–8.1)

## 2023-04-07 LAB — BASIC METABOLIC PANEL
Anion gap: 8 (ref 5–15)
BUN: 25 mg/dL — ABNORMAL HIGH (ref 6–20)
CO2: 21 mmol/L — ABNORMAL LOW (ref 22–32)
Calcium: 7.2 mg/dL — ABNORMAL LOW (ref 8.9–10.3)
Chloride: 103 mmol/L (ref 98–111)
Creatinine, Ser: 0.38 mg/dL — ABNORMAL LOW (ref 0.44–1.00)
GFR, Estimated: >60 mL/min (ref >60)
Glucose, Bld: 90 mg/dL (ref 70–99)
Potassium: 4.6 mmol/L (ref 3.5–5.1)
Sodium: 132 mmol/L — ABNORMAL LOW (ref 135–145)

## 2023-04-07 LAB — PHOSPHORUS: Phosphorus: 2.8 mg/dL (ref 2.5–4.6)

## 2023-04-07 LAB — CBC
HCT: 10.9 % — ABNORMAL LOW (ref 36.0–46.0)
Hemoglobin: 3.6 g/dL — CL (ref 12.0–15.0)
MCH: 29.3 pg (ref 26.0–34.0)
MCHC: 33 g/dL (ref 30.0–36.0)
MCV: 88.6 fL (ref 80.0–100.0)
Platelets: 28 10*3/uL — CL (ref 150–400)
RBC: 1.23 MIL/uL — ABNORMAL LOW (ref 3.87–5.11)
RDW: 14.8 % (ref 11.5–15.5)
WBC: 2.6 10*3/uL — ABNORMAL LOW (ref 4.0–10.5)
nRBC: 0 % (ref 0.0–0.2)

## 2023-04-07 LAB — MASSIVE TRANSFUSION PROTOCOL ORDER (BLOOD BANK NOTIFICATION)

## 2023-04-07 LAB — PROTIME-INR
INR: 1.4 — ABNORMAL HIGH (ref 0.8–1.2)
Prothrombin Time: 16.9 seconds — ABNORMAL HIGH (ref 11.4–15.2)

## 2023-04-07 LAB — GLUCOSE, CAPILLARY
Glucose-Capillary: 73 mg/dL (ref 70–99)
Glucose-Capillary: 102 mg/dL — ABNORMAL HIGH (ref 70–99)
Glucose-Capillary: 186 mg/dL — ABNORMAL HIGH (ref 70–99)

## 2023-04-07 LAB — HEMOGLOBIN AND HEMATOCRIT, BLOOD
HCT: 26.6 % — ABNORMAL LOW (ref 36.0–46.0)
HCT: 31.5 % — ABNORMAL LOW (ref 36.0–46.0)
Hemoglobin: 8.3 g/dL — ABNORMAL LOW (ref 12.0–15.0)
Hemoglobin: 10.3 g/dL — ABNORMAL LOW (ref 12.0–15.0)

## 2023-04-07 LAB — DIC (DISSEMINATED INTRAVASCULAR COAGULATION)PANEL
D-Dimer, Quant: >20 ug/mL-FEU — ABNORMAL HIGH (ref 0.00–0.50)
Fibrinogen: <60 mg/dL — CL (ref 210–475)
INR: 5.9 (ref 0.8–1.2)
Platelets: DECREASED 10*3/uL (ref 150–400)
Prothrombin Time: 53 seconds — ABNORMAL HIGH (ref 11.4–15.2)
Smear Review: NONE SEEN
aPTT: >200 seconds (ref 24–36)

## 2023-04-07 LAB — MAGNESIUM: Magnesium: 1.8 mg/dL (ref 1.7–2.4)

## 2023-04-07 LAB — PREPARE RBC (CROSSMATCH)

## 2023-04-07 MED ORDER — LIDOCAINE HCL 1 % IJ SOLN
INTRAMUSCULAR | Status: AC
  Filled 2023-04-07: qty 20

## 2023-04-07 MED ORDER — SODIUM CHLORIDE 0.9% IV SOLUTION: Freq: Once | INTRAVENOUS | Status: DC

## 2023-04-07 MED ORDER — EPINEPHRINE HCL 5 MG/250ML IV SOLN IN NS
0.5000 ug/min | INTRAVENOUS | Status: DC
  Administered 2023-04-07: 0.5 ug/min via INTRAVENOUS
  Filled 2023-04-07: qty 250

## 2023-04-07 MED ORDER — FENTANYL BOLUS VIA INFUSION
50.0000 ug | INTRAVENOUS | Status: DC | PRN
  Administered 2023-04-07: 50 ug via INTRAVENOUS

## 2023-04-07 MED ORDER — FENTANYL CITRATE (PF) 100 MCG/2ML IJ SOLN
INTRAMUSCULAR | Status: AC
  Filled 2023-04-07: qty 2

## 2023-04-07 MED ORDER — PANTOPRAZOLE SODIUM 40 MG IV SOLR
40.0000 mg | Freq: Once | INTRAVENOUS | Status: AC
  Administered 2023-04-07: 40 mg via INTRAVENOUS

## 2023-04-07 MED ORDER — MIDAZOLAM HCL 2 MG/2ML IJ SOLN: 1.0000 mg | INTRAMUSCULAR | Status: DC | PRN

## 2023-04-07 MED ORDER — SODIUM CHLORIDE 0.9 % IV SOLN
50.0000 ug/h | INTRAVENOUS | Status: DC
  Administered 2023-04-07: 50 ug/h via INTRAVENOUS
  Filled 2023-04-07 (×2): qty 1

## 2023-04-07 MED ORDER — FENTANYL 2500MCG IN NS 250ML (10MCG/ML) PREMIX INFUSION
50.0000 ug/h | INTRAVENOUS | Status: DC
  Administered 2023-04-07: 25 ug/h via INTRAVENOUS

## 2023-04-07 MED ORDER — FENTANYL 2500MCG IN NS 250ML (10MCG/ML) PREMIX INFUSION
INTRAVENOUS | Status: AC
  Filled 2023-04-07: qty 250

## 2023-04-07 MED ORDER — LIDOCAINE HCL 1 % IJ SOLN
20.0000 mL | Freq: Once | INTRAMUSCULAR | Status: AC
  Administered 2023-04-07: 4 mL via INTRADERMAL

## 2023-04-07 MED ORDER — METHYLPREDNISOLONE SODIUM SUCC 40 MG IJ SOLR: 40.0000 mg | Freq: Every day | INTRAMUSCULAR | Status: DC

## 2023-04-07 MED ORDER — VASOPRESSIN 20 UNITS/100 ML INFUSION FOR SHOCK
INTRAVENOUS | Status: AC
  Filled 2023-04-07: qty 100

## 2023-04-07 MED ORDER — SODIUM CHLORIDE 0.9 % IV SOLN
4.0000 g | Freq: Once | INTRAVENOUS | Status: DC
  Filled 2023-04-07: qty 40

## 2023-04-07 MED ORDER — CALCIUM GLUCONATE-NACL 2-0.675 GM/100ML-% IV SOLN
2.0000 g | INTRAVENOUS | Status: AC
  Administered 2023-04-07: 2000 mg via INTRAVENOUS
  Filled 2023-04-07: qty 100

## 2023-04-07 MED ORDER — IOHEXOL 300 MG/ML  SOLN
100.0000 mL | Freq: Once | INTRAMUSCULAR | Status: AC | PRN
  Administered 2023-04-07: 10 mL via INTRA_ARTERIAL

## 2023-04-07 MED ORDER — MIDAZOLAM HCL 2 MG/2ML IJ SOLN
INTRAMUSCULAR | Status: AC
  Filled 2023-04-07: qty 2

## 2023-04-07 MED ORDER — ETOMIDATE 2 MG/ML IV SOLN
INTRAVENOUS | Status: AC
  Filled 2023-04-07: qty 20

## 2023-04-07 MED ORDER — VITAMIN K1 10 MG/ML IJ SOLN
10.0000 mg | Freq: Once | INTRAVENOUS | Status: AC
  Administered 2023-04-07: 10 mg via INTRAVENOUS
  Filled 2023-04-07: qty 1

## 2023-04-07 MED ORDER — POLYETHYLENE GLYCOL 3350 17 G PO PACK: 17.0000 g | PACK | Freq: Every day | ORAL | Status: DC

## 2023-04-07 MED ORDER — TRANEXAMIC ACID-NACL 1000-0.7 MG/100ML-% IV SOLN: 1000.0000 mg | INTRAVENOUS | Status: DC

## 2023-04-07 MED ORDER — VASOPRESSIN 20 UNIT/ML IV SOLN: 0.0400 [IU]/min | INTRAVENOUS | Status: DC

## 2023-04-07 MED ORDER — NOREPINEPHRINE 4 MG/250ML-% IV SOLN
INTRAVENOUS | Status: AC
  Filled 2023-04-07: qty 250

## 2023-04-07 MED ORDER — SODIUM CHLORIDE 0.9 % IV BOLUS
1000.0000 mL | Freq: Once | INTRAVENOUS | Status: AC
  Administered 2023-04-07: 1000 mL via INTRAVENOUS

## 2023-04-07 MED ORDER — METHYLENE BLUE (ANTIDOTE) 1 % IV SOLN
2.0000 mg/kg | Freq: Once | Status: AC
  Administered 2023-04-07: 95 mg via INTRAVENOUS
  Filled 2023-04-07: qty 9.5

## 2023-04-07 MED ORDER — IOHEXOL 300 MG/ML  SOLN
100.0000 mL | Freq: Once | INTRAMUSCULAR | Status: AC | PRN
  Administered 2023-04-07: 20 mL via INTRA_ARTERIAL

## 2023-04-07 MED ORDER — SODIUM BICARBONATE 8.4 % IV SOLN
INTRAVENOUS | Status: AC
  Filled 2023-04-07: qty 150

## 2023-04-07 MED ORDER — DOCUSATE SODIUM 50 MG/5ML PO LIQD: 100.0000 mg | Freq: Two times a day (BID) | ORAL | Status: DC

## 2023-04-07 MED ORDER — PANTOPRAZOLE INFUSION (NEW) - SIMPLE MED
8.0000 mg/h | INTRAVENOUS | Status: DC
  Administered 2023-04-07: 8 mg/h via INTRAVENOUS
  Filled 2023-04-07: qty 100
  Filled 2023-04-07: qty 80

## 2023-04-07 MED ORDER — IOHEXOL 350 MG/ML SOLN
100.0000 mL | Freq: Once | INTRAVENOUS | Status: AC | PRN
  Administered 2023-04-07: 100 mL via INTRAVENOUS

## 2023-04-07 MED ORDER — FENTANYL CITRATE PF 50 MCG/ML IJ SOSY
PREFILLED_SYRINGE | INTRAMUSCULAR | Status: AC
  Administered 2023-04-07: 50 ug
  Filled 2023-04-07: qty 2

## 2023-04-07 MED ORDER — FENTANYL CITRATE PF 50 MCG/ML IJ SOSY: 50.0000 ug | PREFILLED_SYRINGE | Freq: Once | INTRAMUSCULAR | Status: DC

## 2023-04-07 MED ORDER — ROCURONIUM BROMIDE 10 MG/ML (PF) SYRINGE
PREFILLED_SYRINGE | INTRAVENOUS | Status: AC
  Administered 2023-04-07: 50 mg
  Filled 2023-04-07: qty 10

## 2023-04-07 MED ORDER — NOREPINEPHRINE 16 MG/250ML-% IV SOLN
0.0000 ug/min | INTRAVENOUS | Status: DC
  Administered 2023-04-07: 40 ug/min via INTRAVENOUS
  Filled 2023-04-07: qty 250

## 2023-04-07 MED ORDER — OCTREOTIDE LOAD VIA INFUSION
50.0000 ug | Freq: Once | INTRAVENOUS | Status: AC
  Administered 2023-04-07: 50 ug via INTRAVENOUS
  Filled 2023-04-07: qty 25

## 2023-04-07 MED ORDER — NOREPINEPHRINE 4 MG/250ML-% IV SOLN
0.0000 ug/min | INTRAVENOUS | Status: DC
  Administered 2023-04-07: 50 ug/min via INTRAVENOUS
  Administered 2023-04-07: 5 ug/min via INTRAVENOUS
  Administered 2023-04-07: 50 ug/min via INTRAVENOUS
  Filled 2023-04-07 (×3): qty 250

## 2023-04-07 MED ORDER — SODIUM BICARBONATE 8.4 % IV SOLN
INTRAVENOUS | Status: AC
  Administered 2023-04-07: 50 meq
  Filled 2023-04-07: qty 200

## 2023-04-07 MED ORDER — PANTOPRAZOLE 80MG IVPB - SIMPLE MED
80.0000 mg | Freq: Once | INTRAVENOUS | Status: DC
Start: 2023-04-07 — End: 2023-04-07

## 2023-04-07 MED ORDER — CALCIUM GLUCONATE-NACL 2-0.675 GM/100ML-% IV SOLN
2.0000 g | INTRAVENOUS | Status: DC
  Filled 2023-04-07: qty 100

## 2023-04-07 MED ORDER — IOHEXOL 300 MG/ML  SOLN
50.0000 mL | Freq: Once | INTRAMUSCULAR | Status: AC | PRN
  Administered 2023-04-07: 20 mL

## 2023-04-07 MED ORDER — TRANEXAMIC ACID-NACL 1000-0.7 MG/100ML-% IV SOLN
1000.0000 mg | INTRAVENOUS | Status: AC
  Administered 2023-04-07: 1000 mg via INTRAVENOUS
  Filled 2023-04-07: qty 100

## 2023-04-07 MED ORDER — GELATIN ABSORBABLE 12-7 MM EX MISC
1.0000 | Freq: Once | CUTANEOUS | Status: AC
  Administered 2023-04-07: 1 via TOPICAL
  Filled 2023-04-07: qty 1

## 2023-04-07 MED ORDER — VASOPRESSIN 20 UNIT/ML IV SOLN
0.4000 [IU]/min | INTRAVENOUS | Status: DC
  Administered 2023-04-07 (×3): 0.4 [IU]/min via INTRAVENOUS
  Filled 2023-04-07 (×4): qty 2.5

## 2023-04-07 MED ORDER — FAMOTIDINE 20 MG PO TABS: 20.0000 mg | ORAL_TABLET | Freq: Two times a day (BID) | ORAL | Status: DC

## 2023-04-07 MED ORDER — MIDAZOLAM HCL 2 MG/2ML IJ SOLN
INTRAMUSCULAR | Status: AC
  Administered 2023-04-07: 1 mg
  Filled 2023-04-07: qty 2

## 2023-04-07 MED ORDER — KETAMINE HCL 50 MG/5ML IJ SOSY
PREFILLED_SYRINGE | INTRAMUSCULAR | Status: AC
  Administered 2023-04-07: 40 mg
  Filled 2023-04-07: qty 10

## 2023-04-07 MED ORDER — GELATIN ABSORBABLE 12-7 MM EX MISC
CUTANEOUS | Status: AC
  Filled 2023-04-07: qty 1

## 2023-04-08 ENCOUNTER — Ambulatory Visit (HOSPITAL_COMMUNITY): Payer: Medicaid Other

## 2023-04-08 ENCOUNTER — Encounter (HOSPITAL_COMMUNITY): Payer: Self-pay | Admitting: Radiology

## 2023-04-10 LAB — TYPE AND SCREEN
ABO/RH(D): A POS
Antibody Screen: NEGATIVE
Unit division: 0
Unit division: 0
Unit division: 0
Unit division: 0
Unit division: 0
Unit division: 0
Unit division: 0
Unit division: 0
Unit division: 0
Unit division: 0
Unit division: 0
Unit division: 0
Unit division: 0
Unit division: 0
Unit division: 0
Unit division: 0
Unit division: 0
Unit division: 0
Unit division: 0

## 2023-04-10 LAB — BPAM FFP
Blood Product Expiration Date: 202409232359
Blood Product Expiration Date: 202409252359
Blood Product Expiration Date: 202409252359
Blood Product Expiration Date: 202409252359
Blood Product Expiration Date: 202409252359
Blood Product Expiration Date: 202409252359
Blood Product Expiration Date: 202409252359
Blood Product Expiration Date: 202409252359
Blood Product Expiration Date: 202409252359
Blood Product Expiration Date: 202409252359
Blood Product Expiration Date: 202409252359
Blood Product Expiration Date: 202409252359
Blood Product Expiration Date: 202409252359
Blood Product Expiration Date: 202409252359
Blood Product Expiration Date: 202409252359
Blood Product Expiration Date: 202410012359
ISSUE DATE / TIME: 202409201412
ISSUE DATE / TIME: 202409201412
ISSUE DATE / TIME: 202409201440
ISSUE DATE / TIME: 202409201440
ISSUE DATE / TIME: 202409201504
ISSUE DATE / TIME: 202409201504
ISSUE DATE / TIME: 202409201504
ISSUE DATE / TIME: 202409201504
ISSUE DATE / TIME: 202409201559
ISSUE DATE / TIME: 202409201559
ISSUE DATE / TIME: 202409201559
ISSUE DATE / TIME: 202409201559
ISSUE DATE / TIME: 202409201823
ISSUE DATE / TIME: 202409201823
ISSUE DATE / TIME: 202409201823
ISSUE DATE / TIME: 202409201823
Unit Type and Rh: 600
Unit Type and Rh: 600
Unit Type and Rh: 6200
Unit Type and Rh: 6200
Unit Type and Rh: 6200
Unit Type and Rh: 6200
Unit Type and Rh: 6200
Unit Type and Rh: 6200
Unit Type and Rh: 6200
Unit Type and Rh: 6200
Unit Type and Rh: 6200
Unit Type and Rh: 6200
Unit Type and Rh: 6200
Unit Type and Rh: 6200
Unit Type and Rh: 6200
Unit Type and Rh: 6200

## 2023-04-10 LAB — BPAM CRYOPRECIPITATE
Blood Product Expiration Date: 202409202108
Blood Product Expiration Date: 202409210039
Blood Product Expiration Date: 202409210039
Blood Product Expiration Date: 202409210158
Blood Product Expiration Date: 202409210158
ISSUE DATE / TIME: 202409201628
ISSUE DATE / TIME: 202409201847
ISSUE DATE / TIME: 202409201847
ISSUE DATE / TIME: 202409202003
ISSUE DATE / TIME: 202409202003
Unit Type and Rh: 5100
Unit Type and Rh: 5100
Unit Type and Rh: 6200
Unit Type and Rh: 6200
Unit Type and Rh: 6200

## 2023-04-10 LAB — PREPARE CRYOPRECIPITATE
Unit division: 0
Unit division: 0
Unit division: 0
Unit division: 0
Unit division: 0

## 2023-04-10 LAB — PREPARE FRESH FROZEN PLASMA
Unit division: 0
Unit division: 0
Unit division: 0
Unit division: 0
Unit division: 0
Unit division: 0

## 2023-04-10 LAB — BPAM RBC
Blood Product Expiration Date: 202410172359
Blood Product Expiration Date: 202410172359
Blood Product Expiration Date: 202410172359
Blood Product Expiration Date: 202410172359
Blood Product Expiration Date: 202410172359
Blood Product Expiration Date: 202410212359
Blood Product Expiration Date: 202410212359
Blood Product Expiration Date: 202410212359
Blood Product Expiration Date: 202410212359
Blood Product Expiration Date: 202410212359
Blood Product Expiration Date: 202410212359
Blood Product Expiration Date: 202410212359
Blood Product Expiration Date: 202410212359
Blood Product Expiration Date: 202410212359
Blood Product Expiration Date: 202410212359
Blood Product Expiration Date: 202410222359
Blood Product Expiration Date: 202410222359
Blood Product Expiration Date: 202410222359
Blood Product Expiration Date: 202410242359
ISSUE DATE / TIME: 202409201230
ISSUE DATE / TIME: 202409201241
ISSUE DATE / TIME: 202409201241
ISSUE DATE / TIME: 202409201351
ISSUE DATE / TIME: 202409201351
ISSUE DATE / TIME: 202409201434
ISSUE DATE / TIME: 202409201434
ISSUE DATE / TIME: 202409201453
ISSUE DATE / TIME: 202409201453
ISSUE DATE / TIME: 202409201453
ISSUE DATE / TIME: 202409201453
ISSUE DATE / TIME: 202409201540
ISSUE DATE / TIME: 202409201540
ISSUE DATE / TIME: 202409201540
ISSUE DATE / TIME: 202409201540
ISSUE DATE / TIME: 202409201737
ISSUE DATE / TIME: 202409201737
ISSUE DATE / TIME: 202409201737
ISSUE DATE / TIME: 202409201737
Unit Type and Rh: 6200
Unit Type and Rh: 6200
Unit Type and Rh: 6200
Unit Type and Rh: 6200
Unit Type and Rh: 6200
Unit Type and Rh: 6200
Unit Type and Rh: 6200
Unit Type and Rh: 6200
Unit Type and Rh: 6200
Unit Type and Rh: 6200
Unit Type and Rh: 6200
Unit Type and Rh: 6200
Unit Type and Rh: 6200
Unit Type and Rh: 6200
Unit Type and Rh: 6200
Unit Type and Rh: 6200
Unit Type and Rh: 9500
Unit Type and Rh: 9500
Unit Type and Rh: 9500

## 2023-04-10 LAB — PREPARE PLATELET PHERESIS
Unit division: 0
Unit division: 0
Unit division: 0
Unit division: 0
Unit division: 0

## 2023-04-10 LAB — BPAM PLATELET PHERESIS
Blood Product Expiration Date: 202409222359
Blood Product Expiration Date: 202409222359
Blood Product Expiration Date: 202409222359
Blood Product Expiration Date: 202409222359
Blood Product Expiration Date: 202409222359
ISSUE DATE / TIME: 202409201412
ISSUE DATE / TIME: 202409201529
ISSUE DATE / TIME: 202409201829
ISSUE DATE / TIME: 202409201829
ISSUE DATE / TIME: 202409202006
Unit Type and Rh: 5100
Unit Type and Rh: 5100
Unit Type and Rh: 5100
Unit Type and Rh: 600
Unit Type and Rh: 6200

## 2023-04-14 DIAGNOSIS — R578 Other shock: Secondary | ICD-10-CM | POA: Diagnosis not present

## 2023-04-18 NOTE — Progress Notes (Addendum)
eLink Physician-Brief Progress Note Patient Name: Ronae Raybould DOB: 1966-04-23 MRN: [id number]   Date of Service  03/22/2023  HPI/Events of Note  Pt continues to have ongoing bleeding, maxed on 3 pressors.   eICU Interventions  Placed order for tranexamic acid to help pt clot.  Also added octreotide bolus+infusion.  Increased max dose of levophed, ordered epinephrine, as well as a dose of methylene blue to help pt vasocontrict.  Continue with massive transfusion protocol.  Overall prognosis remains poor.         Jolyssa Oplinger M DELA CRUZ 03/30/2023, 7:41 PM  9:05 PM Despite max medical management, pt continued to decompensate.  SBP dropped to the 30s on the [place], pt went into wide complex tachycardia.  Given that she was a DNR, no shocks or chest compressions started.  Family was at bedside, aware that the patient is actively dying.  Will turn down vasopressors, give pt PRN medications for pain.

## 2023-04-18 NOTE — Plan of Care (Signed)
Problem: Clinical Measurements: Goal: Respiratory complications will improve Outcome: Progressing Goal: Cardiovascular complication will be avoided Outcome: Progressing   Problem: Coping: Goal: Level of anxiety will decrease Outcome: Progressing   Problem: Pain Managment: Goal: General experience of comfort will improve Outcome: Progressing   Problem: Safety: Goal: Ability to remain free from injury will improve Outcome: Progressing

## 2023-04-18 NOTE — Consult Note (Signed)
Glucose, Bld 90 70 - 99 mg/dL    Comment: Glucose reference range applies only to samples taken after fasting for at least 8 hours.   BUN 25 (H) 6 - 20 mg/dL   Creatinine, Ser 1.61 (L) 0.44 - 1.00 mg/dL   Calcium 7.2 (L) 8.9 - 10.3 mg/dL   GFR, Estimated >09 >60 mL/min    Comment: (NOTE) Calculated using the CKD-EPI Creatinine Equation (2021)    Anion gap 8 5 - 15    Comment: Performed at [facility], [address], [place], [place] [address]  Prepare fresh frozen plasma     Status: None (Preliminary result)   Collection Time: 04/14/2023  1:28 PM  Result Value Ref Range   Unit Number [id number]    Blood Component Type THAWED PLASMA    Unit division 00    Status of Unit ISSUED    Transfusion Status OK TO TRANSFUSE    Unit Number [id number]    Blood Component Type THAWED PLASMA    Unit division 00    Status of Unit ISSUED    Transfusion Status OK TO TRANSFUSE    Unit Number [id number]    Blood Component Type LIQ PLASMA    Unit division 00    Status of Unit ISSUED    Transfusion Status OK TO TRANSFUSE    Unit Number [id number]    Blood Component Type LIQ PLASMA    Unit division 00    Status of Unit ISSUED    Transfusion Status OK TO TRANSFUSE    Unit Number [id number]    Blood Component Type THW PLS APHR    Unit division A0    Status of Unit ISSUED    Transfusion Status OK TO TRANSFUSE    Unit Number [id number]    Blood Component Type THW PLS APHR    Unit division B0    Status of Unit ISSUED    Transfusion Status OK TO TRANSFUSE    Unit Number [id number]    Blood Component Type THAWED PLASMA    Unit division 00    Status of Unit ISSUED    Transfusion Status OK TO TRANSFUSE    Unit Number [id number]    Blood Component Type THAWED PLASMA    Unit division 00    Status of Unit ISSUED    Transfusion Status OK TO TRANSFUSE    Unit Number [id number]    Blood Component Type THW PLS APHR    Unit division A0    Status of Unit ISSUED    Transfusion Status OK TO TRANSFUSE    Unit Number [id number]    Blood Component Type THW PLS APHR    Unit division B0    Status of Unit ISSUED    Transfusion Status OK TO TRANSFUSE    Unit Number [id number]    Blood Component Type THW PLS APHR    Unit division B0    Status of Unit ISSUED    Transfusion Status OK TO TRANSFUSE    Unit Number [id number]    Blood Component Type THW  PLS APHR    Unit division B0    Status of Unit ISSUED    Transfusion Status OK TO TRANSFUSE    Unit Number [id number]    Blood Component Type THW PLS APHR    Unit division A0    Status of Unit ISSUED    Transfusion Status OK TO TRANSFUSE    Unit Number [id number]    Blood Component Type  Collection Time: 04/02/2023  5:35 PM  Result Value Ref Range   Prothrombin Time 53.0 (H) 11.4 - 15.2 seconds   INR 5.9 (HH) 0.8 - 1.2    Comment: CRITICAL CALLED TO RAHUL D, RN @1823  04/08/2023 BC (NOTE) INR goal varies based on device and disease states.    aPTT >200 (HH) 24 - 36 seconds    Comment:        IF BASELINE aPTT IS ELEVATED, SUGGEST PATIENT RISK ASSESSMENT BE USED TO DETERMINE APPROPRIATE ANTICOAGULANT THERAPY. CRITICAL CALLED TO RAHUL D, RN @1823  04/05/2023 BC    Fibrinogen <60 (LL) 210 - 475 mg/dL    Comment: CRITICAL CALLED TO RAHUL D, RN @1823  03/21/2023 BC (NOTE) Fibrinogen results may be underestimated in patients receiving thrombolytic therapy.    D-Dimer, Quant >20.00 (H) 0.00 - 0.50 ug/mL-FEU    Comment: (NOTE) At the manufacturer cut-off value of 0.5  g/mL FEU, this assay has a negative predictive value of 95-100%.This assay is intended for use in conjunction with a clinical pretest probability (PTP) assessment model to exclude pulmonary embolism (PE) and deep venous thrombosis (DVT) in outpatients suspected of PE or DVT. Results should be correlated with clinical presentation.    Platelets PLATELETS APPEAR DECREASED 150 - 400 K/uL   Smear Review NO SCHISTOCYTES SEEN     Comment: Performed at [facility], [address], [place], [place] [address]  CBC     Status: Abnormal   Collection Time: 04/05/2023  5:35 PM  Result Value Ref Range   WBC 2.6 (L) 4.0 - 10.5 K/uL   RBC 1.23 (L) 3.87 - 5.11 MIL/uL   Hemoglobin 3.6 (LL) 12.0 - 15.0 g/dL    Comment: REPEATED TO VERIFY THIS CRITICAL RESULT HAS VERIFIED AND BEEN CALLED TO STACY WEAVER BY CONEKIN,BETHANY ON 09 20 2024 AT 1805, AND HAS BEEN READ BACK.     HCT 10.9 (L) 36.0 - 46.0 %   MCV 88.6 80.0 - 100.0 fL    Comment: POST TRANSFUSION SPECIMEN REPEATED TO VERIFY DELTA CHECK NOTED    MCH 29.3 26.0 - 34.0 pg   MCHC 33.0 30.0 - 36.0 g/dL   RDW 60.4 54.0 - 98.1 %   Platelets 28 (LL) 150 - 400 K/uL    Comment: SPECIMEN CHECKED FOR CLOTS THIS CRITICAL RESULT HAS VERIFIED AND BEEN CALLED TO STACY WEAVER BY CONEKIN,BETHANY ON 09 20 2024 AT 1805, AND HAS BEEN READ BACK.     nRBC 0.0 0.0 - 0.2 %    Comment: Performed at [facility], [address], [place], [place] [address]  Hemoglobin and hematocrit, blood (STAT)     Status: Abnormal   Collection Time: 04/12/2023  6:25 PM  Result Value Ref Range   Hemoglobin 10.3 (L) 12.0 - 15.0 g/dL    Comment: POST TRANSFUSION SPECIMEN   HCT 31.5 (L) 36.0 - 46.0 %    Comment: Performed at [facility], [address] [place], [place] [address]  Glucose, capillary     Status: Abnormal   Collection Time: 04/10/2023  7:35 PM  Result Value Ref Range   Glucose-Capillary 186 (H) 70 - 99 mg/dL     Comment: Glucose reference range applies only to samples taken after fasting for at least 8 hours.    CT ANGIO GI BLEED  Result Date: 03/20/2023 CLINICAL DATA:  Lower GI bleeding.  History alcoholic cirrhosis. EXAM: CTA ABDOMEN AND PELVIS WITHOUT AND WITH CONTRAST TECHNIQUE: Multidetector CT imaging of the  Glucose, Bld 90 70 - 99 mg/dL    Comment: Glucose reference range applies only to samples taken after fasting for at least 8 hours.   BUN 25 (H) 6 - 20 mg/dL   Creatinine, Ser 1.61 (L) 0.44 - 1.00 mg/dL   Calcium 7.2 (L) 8.9 - 10.3 mg/dL   GFR, Estimated >09 >60 mL/min    Comment: (NOTE) Calculated using the CKD-EPI Creatinine Equation (2021)    Anion gap 8 5 - 15    Comment: Performed at [facility], [address], [place], [place] [address]  Prepare fresh frozen plasma     Status: None (Preliminary result)   Collection Time: 04/14/2023  1:28 PM  Result Value Ref Range   Unit Number [id number]    Blood Component Type THAWED PLASMA    Unit division 00    Status of Unit ISSUED    Transfusion Status OK TO TRANSFUSE    Unit Number [id number]    Blood Component Type THAWED PLASMA    Unit division 00    Status of Unit ISSUED    Transfusion Status OK TO TRANSFUSE    Unit Number [id number]    Blood Component Type LIQ PLASMA    Unit division 00    Status of Unit ISSUED    Transfusion Status OK TO TRANSFUSE    Unit Number [id number]    Blood Component Type LIQ PLASMA    Unit division 00    Status of Unit ISSUED    Transfusion Status OK TO TRANSFUSE    Unit Number [id number]    Blood Component Type THW PLS APHR    Unit division A0    Status of Unit ISSUED    Transfusion Status OK TO TRANSFUSE    Unit Number [id number]    Blood Component Type THW PLS APHR    Unit division B0    Status of Unit ISSUED    Transfusion Status OK TO TRANSFUSE    Unit Number [id number]    Blood Component Type THAWED PLASMA    Unit division 00    Status of Unit ISSUED    Transfusion Status OK TO TRANSFUSE    Unit Number [id number]    Blood Component Type THAWED PLASMA    Unit division 00    Status of Unit ISSUED    Transfusion Status OK TO TRANSFUSE    Unit Number [id number]    Blood Component Type THW PLS APHR    Unit division A0    Status of Unit ISSUED    Transfusion Status OK TO TRANSFUSE    Unit Number [id number]    Blood Component Type THW PLS APHR    Unit division B0    Status of Unit ISSUED    Transfusion Status OK TO TRANSFUSE    Unit Number [id number]    Blood Component Type THW PLS APHR    Unit division B0    Status of Unit ISSUED    Transfusion Status OK TO TRANSFUSE    Unit Number [id number]    Blood Component Type THW  PLS APHR    Unit division B0    Status of Unit ISSUED    Transfusion Status OK TO TRANSFUSE    Unit Number [id number]    Blood Component Type THW PLS APHR    Unit division A0    Status of Unit ISSUED    Transfusion Status OK TO TRANSFUSE    Unit Number [id number]    Blood Component Type  Glucose, Bld 90 70 - 99 mg/dL    Comment: Glucose reference range applies only to samples taken after fasting for at least 8 hours.   BUN 25 (H) 6 - 20 mg/dL   Creatinine, Ser 1.61 (L) 0.44 - 1.00 mg/dL   Calcium 7.2 (L) 8.9 - 10.3 mg/dL   GFR, Estimated >09 >60 mL/min    Comment: (NOTE) Calculated using the CKD-EPI Creatinine Equation (2021)    Anion gap 8 5 - 15    Comment: Performed at [facility], [address], [place], [place] [address]  Prepare fresh frozen plasma     Status: None (Preliminary result)   Collection Time: 04/14/2023  1:28 PM  Result Value Ref Range   Unit Number [id number]    Blood Component Type THAWED PLASMA    Unit division 00    Status of Unit ISSUED    Transfusion Status OK TO TRANSFUSE    Unit Number [id number]    Blood Component Type THAWED PLASMA    Unit division 00    Status of Unit ISSUED    Transfusion Status OK TO TRANSFUSE    Unit Number [id number]    Blood Component Type LIQ PLASMA    Unit division 00    Status of Unit ISSUED    Transfusion Status OK TO TRANSFUSE    Unit Number [id number]    Blood Component Type LIQ PLASMA    Unit division 00    Status of Unit ISSUED    Transfusion Status OK TO TRANSFUSE    Unit Number [id number]    Blood Component Type THW PLS APHR    Unit division A0    Status of Unit ISSUED    Transfusion Status OK TO TRANSFUSE    Unit Number [id number]    Blood Component Type THW PLS APHR    Unit division B0    Status of Unit ISSUED    Transfusion Status OK TO TRANSFUSE    Unit Number [id number]    Blood Component Type THAWED PLASMA    Unit division 00    Status of Unit ISSUED    Transfusion Status OK TO TRANSFUSE    Unit Number [id number]    Blood Component Type THAWED PLASMA    Unit division 00    Status of Unit ISSUED    Transfusion Status OK TO TRANSFUSE    Unit Number [id number]    Blood Component Type THW PLS APHR    Unit division A0    Status of Unit ISSUED    Transfusion Status OK TO TRANSFUSE    Unit Number [id number]    Blood Component Type THW PLS APHR    Unit division B0    Status of Unit ISSUED    Transfusion Status OK TO TRANSFUSE    Unit Number [id number]    Blood Component Type THW PLS APHR    Unit division B0    Status of Unit ISSUED    Transfusion Status OK TO TRANSFUSE    Unit Number [id number]    Blood Component Type THW  PLS APHR    Unit division B0    Status of Unit ISSUED    Transfusion Status OK TO TRANSFUSE    Unit Number [id number]    Blood Component Type THW PLS APHR    Unit division A0    Status of Unit ISSUED    Transfusion Status OK TO TRANSFUSE    Unit Number [id number]    Blood Component Type  mg/dL    Comment: Performed at [facility], [address] [place], [place] [address]  Magnesium     Status: None   Collection Time: 04/05/2023  4:34 AM  Result  Value Ref Range   Magnesium 1.8 1.7 - 2.4 mg/dL    Comment: Performed at [facility], [address] [place], [place] [address]  Glucose, capillary     Status: None   Collection Time: 04/10/2023  7:36 AM  Result Value Ref Range   Glucose-Capillary 73 70 - 99 mg/dL    Comment: Glucose reference range applies only to samples taken after fasting for at least 8 hours.  Glucose, capillary     Status: Abnormal   Collection Time: 03/28/2023 11:43 AM  Result Value Ref Range   Glucose-Capillary 102 (H) 70 - 99 mg/dL    Comment: Glucose reference range applies only to samples taken after fasting for at least 8 hours.  Type and screen [facility]     Status: None (Preliminary result)   Collection Time: 04/03/2023 12:18 PM  Result Value Ref Range   ABO/RH(D) A POS    Antibody Screen NEG    Sample Expiration 04/10/2023,2359    Unit Number [id number]    Blood Component Type RBC LR PHER2    Unit division 00    Status of Unit ISSUED    Unit tag comment EMERGENCY RELEASE    Transfusion Status OK TO TRANSFUSE    Crossmatch Result COMPATIBLE    Unit Number [id number]    Blood Component Type RBC LR PHER2    Unit division 00    Status of Unit ISSUED    Unit tag comment EMERGENCY RELEASE    Transfusion Status OK TO TRANSFUSE    Crossmatch Result COMPATIBLE    Unit Number [id number]    Blood Component Type RED CELLS,LR    Unit division 00    Status of Unit ISSUED    Unit tag comment EMERGENCY RELEASE    Transfusion Status OK TO TRANSFUSE    Crossmatch Result COMPATIBLE    Unit Number [id number]    Blood Component Type RED CELLS,LR    Unit division 00    Status of Unit ISSUED    Transfusion Status OK TO TRANSFUSE    Crossmatch Result Compatible    Unit Number [id number]    Blood Component Type RED CELLS,LR    Unit division 00    Status of Unit ISSUED    Transfusion Status OK TO TRANSFUSE    Crossmatch Result Compatible    Unit Number  [id number]    Blood Component Type RED CELLS,LR    Unit division 00    Status of Unit ISSUED    Transfusion Status OK TO TRANSFUSE    Crossmatch Result Compatible    Unit Number [id number]    Blood Component Type RED CELLS,LR    Unit division 00    Status of Unit ISSUED    Transfusion Status OK TO TRANSFUSE    Crossmatch Result Compatible    Unit Number [id number]    Blood Component Type RBC LR PHER1    Unit division 00    Status of Unit ISSUED    Transfusion Status OK TO TRANSFUSE    Crossmatch Result Compatible    Unit Number [id number]    Blood Component Type RBC LR PHER2    Unit division 00    Status of Unit ISSUED    Transfusion Status OK TO TRANSFUSE  mg/dL    Comment: Performed at [facility], [address] [place], [place] [address]  Magnesium     Status: None   Collection Time: 04/05/2023  4:34 AM  Result  Value Ref Range   Magnesium 1.8 1.7 - 2.4 mg/dL    Comment: Performed at [facility], [address] [place], [place] [address]  Glucose, capillary     Status: None   Collection Time: 04/10/2023  7:36 AM  Result Value Ref Range   Glucose-Capillary 73 70 - 99 mg/dL    Comment: Glucose reference range applies only to samples taken after fasting for at least 8 hours.  Glucose, capillary     Status: Abnormal   Collection Time: 03/28/2023 11:43 AM  Result Value Ref Range   Glucose-Capillary 102 (H) 70 - 99 mg/dL    Comment: Glucose reference range applies only to samples taken after fasting for at least 8 hours.  Type and screen [facility]     Status: None (Preliminary result)   Collection Time: 04/03/2023 12:18 PM  Result Value Ref Range   ABO/RH(D) A POS    Antibody Screen NEG    Sample Expiration 04/10/2023,2359    Unit Number [id number]    Blood Component Type RBC LR PHER2    Unit division 00    Status of Unit ISSUED    Unit tag comment EMERGENCY RELEASE    Transfusion Status OK TO TRANSFUSE    Crossmatch Result COMPATIBLE    Unit Number [id number]    Blood Component Type RBC LR PHER2    Unit division 00    Status of Unit ISSUED    Unit tag comment EMERGENCY RELEASE    Transfusion Status OK TO TRANSFUSE    Crossmatch Result COMPATIBLE    Unit Number [id number]    Blood Component Type RED CELLS,LR    Unit division 00    Status of Unit ISSUED    Unit tag comment EMERGENCY RELEASE    Transfusion Status OK TO TRANSFUSE    Crossmatch Result COMPATIBLE    Unit Number [id number]    Blood Component Type RED CELLS,LR    Unit division 00    Status of Unit ISSUED    Transfusion Status OK TO TRANSFUSE    Crossmatch Result Compatible    Unit Number [id number]    Blood Component Type RED CELLS,LR    Unit division 00    Status of Unit ISSUED    Transfusion Status OK TO TRANSFUSE    Crossmatch Result Compatible    Unit Number  [id number]    Blood Component Type RED CELLS,LR    Unit division 00    Status of Unit ISSUED    Transfusion Status OK TO TRANSFUSE    Crossmatch Result Compatible    Unit Number [id number]    Blood Component Type RED CELLS,LR    Unit division 00    Status of Unit ISSUED    Transfusion Status OK TO TRANSFUSE    Crossmatch Result Compatible    Unit Number [id number]    Blood Component Type RBC LR PHER1    Unit division 00    Status of Unit ISSUED    Transfusion Status OK TO TRANSFUSE    Crossmatch Result Compatible    Unit Number [id number]    Blood Component Type RBC LR PHER2    Unit division 00    Status of Unit ISSUED    Transfusion Status OK TO TRANSFUSE  Glucose, Bld 90 70 - 99 mg/dL    Comment: Glucose reference range applies only to samples taken after fasting for at least 8 hours.   BUN 25 (H) 6 - 20 mg/dL   Creatinine, Ser 1.61 (L) 0.44 - 1.00 mg/dL   Calcium 7.2 (L) 8.9 - 10.3 mg/dL   GFR, Estimated >09 >60 mL/min    Comment: (NOTE) Calculated using the CKD-EPI Creatinine Equation (2021)    Anion gap 8 5 - 15    Comment: Performed at [facility], [address], [place], [place] [address]  Prepare fresh frozen plasma     Status: None (Preliminary result)   Collection Time: 04/14/2023  1:28 PM  Result Value Ref Range   Unit Number [id number]    Blood Component Type THAWED PLASMA    Unit division 00    Status of Unit ISSUED    Transfusion Status OK TO TRANSFUSE    Unit Number [id number]    Blood Component Type THAWED PLASMA    Unit division 00    Status of Unit ISSUED    Transfusion Status OK TO TRANSFUSE    Unit Number [id number]    Blood Component Type LIQ PLASMA    Unit division 00    Status of Unit ISSUED    Transfusion Status OK TO TRANSFUSE    Unit Number [id number]    Blood Component Type LIQ PLASMA    Unit division 00    Status of Unit ISSUED    Transfusion Status OK TO TRANSFUSE    Unit Number [id number]    Blood Component Type THW PLS APHR    Unit division A0    Status of Unit ISSUED    Transfusion Status OK TO TRANSFUSE    Unit Number [id number]    Blood Component Type THW PLS APHR    Unit division B0    Status of Unit ISSUED    Transfusion Status OK TO TRANSFUSE    Unit Number [id number]    Blood Component Type THAWED PLASMA    Unit division 00    Status of Unit ISSUED    Transfusion Status OK TO TRANSFUSE    Unit Number [id number]    Blood Component Type THAWED PLASMA    Unit division 00    Status of Unit ISSUED    Transfusion Status OK TO TRANSFUSE    Unit Number [id number]    Blood Component Type THW PLS APHR    Unit division A0    Status of Unit ISSUED    Transfusion Status OK TO TRANSFUSE    Unit Number [id number]    Blood Component Type THW PLS APHR    Unit division B0    Status of Unit ISSUED    Transfusion Status OK TO TRANSFUSE    Unit Number [id number]    Blood Component Type THW PLS APHR    Unit division B0    Status of Unit ISSUED    Transfusion Status OK TO TRANSFUSE    Unit Number [id number]    Blood Component Type THW  PLS APHR    Unit division B0    Status of Unit ISSUED    Transfusion Status OK TO TRANSFUSE    Unit Number [id number]    Blood Component Type THW PLS APHR    Unit division A0    Status of Unit ISSUED    Transfusion Status OK TO TRANSFUSE    Unit Number [id number]    Blood Component Type  Glucose, Bld 90 70 - 99 mg/dL    Comment: Glucose reference range applies only to samples taken after fasting for at least 8 hours.   BUN 25 (H) 6 - 20 mg/dL   Creatinine, Ser 1.61 (L) 0.44 - 1.00 mg/dL   Calcium 7.2 (L) 8.9 - 10.3 mg/dL   GFR, Estimated >09 >60 mL/min    Comment: (NOTE) Calculated using the CKD-EPI Creatinine Equation (2021)    Anion gap 8 5 - 15    Comment: Performed at [facility], [address], [place], [place] [address]  Prepare fresh frozen plasma     Status: None (Preliminary result)   Collection Time: 04/14/2023  1:28 PM  Result Value Ref Range   Unit Number [id number]    Blood Component Type THAWED PLASMA    Unit division 00    Status of Unit ISSUED    Transfusion Status OK TO TRANSFUSE    Unit Number [id number]    Blood Component Type THAWED PLASMA    Unit division 00    Status of Unit ISSUED    Transfusion Status OK TO TRANSFUSE    Unit Number [id number]    Blood Component Type LIQ PLASMA    Unit division 00    Status of Unit ISSUED    Transfusion Status OK TO TRANSFUSE    Unit Number [id number]    Blood Component Type LIQ PLASMA    Unit division 00    Status of Unit ISSUED    Transfusion Status OK TO TRANSFUSE    Unit Number [id number]    Blood Component Type THW PLS APHR    Unit division A0    Status of Unit ISSUED    Transfusion Status OK TO TRANSFUSE    Unit Number [id number]    Blood Component Type THW PLS APHR    Unit division B0    Status of Unit ISSUED    Transfusion Status OK TO TRANSFUSE    Unit Number [id number]    Blood Component Type THAWED PLASMA    Unit division 00    Status of Unit ISSUED    Transfusion Status OK TO TRANSFUSE    Unit Number [id number]    Blood Component Type THAWED PLASMA    Unit division 00    Status of Unit ISSUED    Transfusion Status OK TO TRANSFUSE    Unit Number [id number]    Blood Component Type THW PLS APHR    Unit division A0    Status of Unit ISSUED    Transfusion Status OK TO TRANSFUSE    Unit Number [id number]    Blood Component Type THW PLS APHR    Unit division B0    Status of Unit ISSUED    Transfusion Status OK TO TRANSFUSE    Unit Number [id number]    Blood Component Type THW PLS APHR    Unit division B0    Status of Unit ISSUED    Transfusion Status OK TO TRANSFUSE    Unit Number [id number]    Blood Component Type THW  PLS APHR    Unit division B0    Status of Unit ISSUED    Transfusion Status OK TO TRANSFUSE    Unit Number [id number]    Blood Component Type THW PLS APHR    Unit division A0    Status of Unit ISSUED    Transfusion Status OK TO TRANSFUSE    Unit Number [id number]    Blood Component Type  Collection Time: 04/02/2023  5:35 PM  Result Value Ref Range   Prothrombin Time 53.0 (H) 11.4 - 15.2 seconds   INR 5.9 (HH) 0.8 - 1.2    Comment: CRITICAL CALLED TO RAHUL D, RN @1823  04/08/2023 BC (NOTE) INR goal varies based on device and disease states.    aPTT >200 (HH) 24 - 36 seconds    Comment:        IF BASELINE aPTT IS ELEVATED, SUGGEST PATIENT RISK ASSESSMENT BE USED TO DETERMINE APPROPRIATE ANTICOAGULANT THERAPY. CRITICAL CALLED TO RAHUL D, RN @1823  04/05/2023 BC    Fibrinogen <60 (LL) 210 - 475 mg/dL    Comment: CRITICAL CALLED TO RAHUL D, RN @1823  03/21/2023 BC (NOTE) Fibrinogen results may be underestimated in patients receiving thrombolytic therapy.    D-Dimer, Quant >20.00 (H) 0.00 - 0.50 ug/mL-FEU    Comment: (NOTE) At the manufacturer cut-off value of 0.5  g/mL FEU, this assay has a negative predictive value of 95-100%.This assay is intended for use in conjunction with a clinical pretest probability (PTP) assessment model to exclude pulmonary embolism (PE) and deep venous thrombosis (DVT) in outpatients suspected of PE or DVT. Results should be correlated with clinical presentation.    Platelets PLATELETS APPEAR DECREASED 150 - 400 K/uL   Smear Review NO SCHISTOCYTES SEEN     Comment: Performed at [facility], [address], [place], [place] [address]  CBC     Status: Abnormal   Collection Time: 04/05/2023  5:35 PM  Result Value Ref Range   WBC 2.6 (L) 4.0 - 10.5 K/uL   RBC 1.23 (L) 3.87 - 5.11 MIL/uL   Hemoglobin 3.6 (LL) 12.0 - 15.0 g/dL    Comment: REPEATED TO VERIFY THIS CRITICAL RESULT HAS VERIFIED AND BEEN CALLED TO STACY WEAVER BY CONEKIN,BETHANY ON 09 20 2024 AT 1805, AND HAS BEEN READ BACK.     HCT 10.9 (L) 36.0 - 46.0 %   MCV 88.6 80.0 - 100.0 fL    Comment: POST TRANSFUSION SPECIMEN REPEATED TO VERIFY DELTA CHECK NOTED    MCH 29.3 26.0 - 34.0 pg   MCHC 33.0 30.0 - 36.0 g/dL   RDW 60.4 54.0 - 98.1 %   Platelets 28 (LL) 150 - 400 K/uL    Comment: SPECIMEN CHECKED FOR CLOTS THIS CRITICAL RESULT HAS VERIFIED AND BEEN CALLED TO STACY WEAVER BY CONEKIN,BETHANY ON 09 20 2024 AT 1805, AND HAS BEEN READ BACK.     nRBC 0.0 0.0 - 0.2 %    Comment: Performed at [facility], [address], [place], [place] [address]  Hemoglobin and hematocrit, blood (STAT)     Status: Abnormal   Collection Time: 04/12/2023  6:25 PM  Result Value Ref Range   Hemoglobin 10.3 (L) 12.0 - 15.0 g/dL    Comment: POST TRANSFUSION SPECIMEN   HCT 31.5 (L) 36.0 - 46.0 %    Comment: Performed at [facility], [address] [place], [place] [address]  Glucose, capillary     Status: Abnormal   Collection Time: 04/10/2023  7:35 PM  Result Value Ref Range   Glucose-Capillary 186 (H) 70 - 99 mg/dL     Comment: Glucose reference range applies only to samples taken after fasting for at least 8 hours.    CT ANGIO GI BLEED  Result Date: 03/20/2023 CLINICAL DATA:  Lower GI bleeding.  History alcoholic cirrhosis. EXAM: CTA ABDOMEN AND PELVIS WITHOUT AND WITH CONTRAST TECHNIQUE: Multidetector CT imaging of the  Collection Time: 04/02/2023  5:35 PM  Result Value Ref Range   Prothrombin Time 53.0 (H) 11.4 - 15.2 seconds   INR 5.9 (HH) 0.8 - 1.2    Comment: CRITICAL CALLED TO RAHUL D, RN @1823  04/08/2023 BC (NOTE) INR goal varies based on device and disease states.    aPTT >200 (HH) 24 - 36 seconds    Comment:        IF BASELINE aPTT IS ELEVATED, SUGGEST PATIENT RISK ASSESSMENT BE USED TO DETERMINE APPROPRIATE ANTICOAGULANT THERAPY. CRITICAL CALLED TO RAHUL D, RN @1823  04/05/2023 BC    Fibrinogen <60 (LL) 210 - 475 mg/dL    Comment: CRITICAL CALLED TO RAHUL D, RN @1823  03/21/2023 BC (NOTE) Fibrinogen results may be underestimated in patients receiving thrombolytic therapy.    D-Dimer, Quant >20.00 (H) 0.00 - 0.50 ug/mL-FEU    Comment: (NOTE) At the manufacturer cut-off value of 0.5  g/mL FEU, this assay has a negative predictive value of 95-100%.This assay is intended for use in conjunction with a clinical pretest probability (PTP) assessment model to exclude pulmonary embolism (PE) and deep venous thrombosis (DVT) in outpatients suspected of PE or DVT. Results should be correlated with clinical presentation.    Platelets PLATELETS APPEAR DECREASED 150 - 400 K/uL   Smear Review NO SCHISTOCYTES SEEN     Comment: Performed at [facility], [address], [place], [place] [address]  CBC     Status: Abnormal   Collection Time: 04/05/2023  5:35 PM  Result Value Ref Range   WBC 2.6 (L) 4.0 - 10.5 K/uL   RBC 1.23 (L) 3.87 - 5.11 MIL/uL   Hemoglobin 3.6 (LL) 12.0 - 15.0 g/dL    Comment: REPEATED TO VERIFY THIS CRITICAL RESULT HAS VERIFIED AND BEEN CALLED TO STACY WEAVER BY CONEKIN,BETHANY ON 09 20 2024 AT 1805, AND HAS BEEN READ BACK.     HCT 10.9 (L) 36.0 - 46.0 %   MCV 88.6 80.0 - 100.0 fL    Comment: POST TRANSFUSION SPECIMEN REPEATED TO VERIFY DELTA CHECK NOTED    MCH 29.3 26.0 - 34.0 pg   MCHC 33.0 30.0 - 36.0 g/dL   RDW 60.4 54.0 - 98.1 %   Platelets 28 (LL) 150 - 400 K/uL    Comment: SPECIMEN CHECKED FOR CLOTS THIS CRITICAL RESULT HAS VERIFIED AND BEEN CALLED TO STACY WEAVER BY CONEKIN,BETHANY ON 09 20 2024 AT 1805, AND HAS BEEN READ BACK.     nRBC 0.0 0.0 - 0.2 %    Comment: Performed at [facility], [address], [place], [place] [address]  Hemoglobin and hematocrit, blood (STAT)     Status: Abnormal   Collection Time: 04/12/2023  6:25 PM  Result Value Ref Range   Hemoglobin 10.3 (L) 12.0 - 15.0 g/dL    Comment: POST TRANSFUSION SPECIMEN   HCT 31.5 (L) 36.0 - 46.0 %    Comment: Performed at [facility], [address] [place], [place] [address]  Glucose, capillary     Status: Abnormal   Collection Time: 04/10/2023  7:35 PM  Result Value Ref Range   Glucose-Capillary 186 (H) 70 - 99 mg/dL     Comment: Glucose reference range applies only to samples taken after fasting for at least 8 hours.    CT ANGIO GI BLEED  Result Date: 03/20/2023 CLINICAL DATA:  Lower GI bleeding.  History alcoholic cirrhosis. EXAM: CTA ABDOMEN AND PELVIS WITHOUT AND WITH CONTRAST TECHNIQUE: Multidetector CT imaging of the  Glucose, Bld 90 70 - 99 mg/dL    Comment: Glucose reference range applies only to samples taken after fasting for at least 8 hours.   BUN 25 (H) 6 - 20 mg/dL   Creatinine, Ser 1.61 (L) 0.44 - 1.00 mg/dL   Calcium 7.2 (L) 8.9 - 10.3 mg/dL   GFR, Estimated >09 >60 mL/min    Comment: (NOTE) Calculated using the CKD-EPI Creatinine Equation (2021)    Anion gap 8 5 - 15    Comment: Performed at [facility], [address], [place], [place] [address]  Prepare fresh frozen plasma     Status: None (Preliminary result)   Collection Time: 04/14/2023  1:28 PM  Result Value Ref Range   Unit Number [id number]    Blood Component Type THAWED PLASMA    Unit division 00    Status of Unit ISSUED    Transfusion Status OK TO TRANSFUSE    Unit Number [id number]    Blood Component Type THAWED PLASMA    Unit division 00    Status of Unit ISSUED    Transfusion Status OK TO TRANSFUSE    Unit Number [id number]    Blood Component Type LIQ PLASMA    Unit division 00    Status of Unit ISSUED    Transfusion Status OK TO TRANSFUSE    Unit Number [id number]    Blood Component Type LIQ PLASMA    Unit division 00    Status of Unit ISSUED    Transfusion Status OK TO TRANSFUSE    Unit Number [id number]    Blood Component Type THW PLS APHR    Unit division A0    Status of Unit ISSUED    Transfusion Status OK TO TRANSFUSE    Unit Number [id number]    Blood Component Type THW PLS APHR    Unit division B0    Status of Unit ISSUED    Transfusion Status OK TO TRANSFUSE    Unit Number [id number]    Blood Component Type THAWED PLASMA    Unit division 00    Status of Unit ISSUED    Transfusion Status OK TO TRANSFUSE    Unit Number [id number]    Blood Component Type THAWED PLASMA    Unit division 00    Status of Unit ISSUED    Transfusion Status OK TO TRANSFUSE    Unit Number [id number]    Blood Component Type THW PLS APHR    Unit division A0    Status of Unit ISSUED    Transfusion Status OK TO TRANSFUSE    Unit Number [id number]    Blood Component Type THW PLS APHR    Unit division B0    Status of Unit ISSUED    Transfusion Status OK TO TRANSFUSE    Unit Number [id number]    Blood Component Type THW PLS APHR    Unit division B0    Status of Unit ISSUED    Transfusion Status OK TO TRANSFUSE    Unit Number [id number]    Blood Component Type THW  PLS APHR    Unit division B0    Status of Unit ISSUED    Transfusion Status OK TO TRANSFUSE    Unit Number [id number]    Blood Component Type THW PLS APHR    Unit division A0    Status of Unit ISSUED    Transfusion Status OK TO TRANSFUSE    Unit Number [id number]    Blood Component Type

## 2023-04-18 NOTE — Consult Note (Signed)
NAME:  Christina Miller, MRN:  [id number], DOB:  1966-04-15, LOS: 7 ADMISSION DATE:  03/20/2023, CONSULTATION DATE: 03/27/2023 REFERRING MD: Dr. Marland Mcalpine, CHIEF COMPLAINT: GI bleeding, shock  History of Present Illness:  57 year old woman with a history of MS, alcohol abuse with hepatic steatosis/cirrhosis, hypertension, GERD, COPD.  She was admitted on 03/27/2023 with apparent alcoholic hepatitis.  She has had some improvement with corticosteroid treatment.  Paracentesis done at admission was negative for SBP.  Treated with lactulose, discontinued on 9/19.  Showed some evidence of small amount BRBPR 9/18-9/19.  Then afternoon 9/20 noted to have large-volume bleeding with clots, associated with some hemodynamic instability, tachycardia and hypotension. Hgb 10.7 >> 8.3.  Mental status remained intact.  CT angiography of the abdomen recommended by GI.  She moves now 9/20 urgently to the ICU with hemorrhagic shock.  IVF bolus initiated, vitamin K given to address mild hepatic coagulopathy (INR 1.4).  Urgent release PRBC have been ordered.  Poor IV access, single 22-gauge PIV in place.   Pertinent  Medical History   Past Medical History:  Diagnosis Date   Abnormal Pap smear    cryo   Allergy    Anemia    Bone fracture    ankle   Cancer (HCC) 1987   cervical Cancer   Colonic diverticular abscess 02/12/2019   COPD (chronic obstructive pulmonary disease) (HCC)    Emphysema    Emphysema of lung (HCC)    GERD (gastroesophageal reflux disease)    Headache(784.0)    Hypertension    Infection    urinary tract infection   MS (multiple sclerosis) (HCC)    Neuromuscular disorder (HCC)    hands and feet face and lip and muscle weakness due to MS   Pelvic abscess in female    Sepsis due to Escherichia coli (E. coli) (HCC) 03/09/2018    Significant Hospital Events: Including procedures, antibiotic start and stop dates in addition to other pertinent events   CT abdomen pelvis 03/31/2023 >> small  left effusion, benign 4 mm right middle lobe nodule.  Hepatic steatosis, thickening distal esophagus and stomach with mucosal enhancement.  No obstruction.  Prior partial colectomy with anastomotic site in the rectosigmoid.  Rectal wall thickening, ascending colon and cecum Abdominal ultrasound 9/13 >> hepatic steatosis, nodular liver contour, bidirectional portal vein flow.  Gallbladder sludge and wall thickening without any clear signs of acute cholecystitis.  Ascites present  Interim History / Subjective:  Denies abdominal pain She is still able to interact, answer questions  Objective   Blood pressure (!) 64/50, pulse (!) 128, temperature (!) 97.5 F (36.4 C), temperature source Oral, resp. rate 16, height 5\' 1"  (1.549 m), weight 47.6 kg, last menstrual period 06/20/2012, SpO2 99%.       No intake or output data in the 24 hours ending 03/23/2023 1247 Filed Weights   04/05/23 0512 04/06/23 0500 04/06/2023 0500  Weight: 49.7 kg 49.1 kg 47.6 kg    Examination: General: Cachectic woman, jaundiced, ill-appearing HENT: Oropharynx dry, scleral icterus present Lungs: Clear bilaterally Cardiovascular: Tachycardic, regular, no murmur Abdomen: No significant distention, no tenderness, positive bowel sounds Extremities: 1+ edema lower extremities Neuro: She is awake, a bit slow to respond but oriented and answers questions appropriately, follows commands.  Moves extremities  Resolved Hospital Problem list   E. coli UTI, treatment completed 9/18  Assessment & Plan:   Acute GI bleeding with blood loss anemia, suspect lower source given painless BRBPR although she is at risk for  NAME:  Christina Miller, MRN:  [id number], DOB:  1966-04-15, LOS: 7 ADMISSION DATE:  03/31/2023, CONSULTATION DATE: 03/27/2023 REFERRING MD: Dr. Marland Mcalpine, CHIEF COMPLAINT: GI bleeding, shock  History of Present Illness:  57 year old woman with a history of MS, alcohol abuse with hepatic steatosis/cirrhosis, hypertension, GERD, COPD.  She was admitted on 03/28/2023 with apparent alcoholic hepatitis.  She has had some improvement with corticosteroid treatment.  Paracentesis done at admission was negative for SBP.  Treated with lactulose, discontinued on 9/19.  Showed some evidence of small amount BRBPR 9/18-9/19.  Then afternoon 9/20 noted to have large-volume bleeding with clots, associated with some hemodynamic instability, tachycardia and hypotension. Hgb 10.7 >> 8.3.  Mental status remained intact.  CT angiography of the abdomen recommended by GI.  She moves now 9/20 urgently to the ICU with hemorrhagic shock.  IVF bolus initiated, vitamin K given to address mild hepatic coagulopathy (INR 1.4).  Urgent release PRBC have been ordered.  Poor IV access, single 22-gauge PIV in place.   Pertinent  Medical History   Past Medical History:  Diagnosis Date   Abnormal Pap smear    cryo   Allergy    Anemia    Bone fracture    ankle   Cancer (HCC) 1987   cervical Cancer   Colonic diverticular abscess 02/12/2019   COPD (chronic obstructive pulmonary disease) (HCC)    Emphysema    Emphysema of lung (HCC)    GERD (gastroesophageal reflux disease)    Headache(784.0)    Hypertension    Infection    urinary tract infection   MS (multiple sclerosis) (HCC)    Neuromuscular disorder (HCC)    hands and feet face and lip and muscle weakness due to MS   Pelvic abscess in female    Sepsis due to Escherichia coli (E. coli) (HCC) 03/09/2018    Significant Hospital Events: Including procedures, antibiotic start and stop dates in addition to other pertinent events   CT abdomen pelvis 03/31/2023 >> small  left effusion, benign 4 mm right middle lobe nodule.  Hepatic steatosis, thickening distal esophagus and stomach with mucosal enhancement.  No obstruction.  Prior partial colectomy with anastomotic site in the rectosigmoid.  Rectal wall thickening, ascending colon and cecum Abdominal ultrasound 9/13 >> hepatic steatosis, nodular liver contour, bidirectional portal vein flow.  Gallbladder sludge and wall thickening without any clear signs of acute cholecystitis.  Ascites present  Interim History / Subjective:  Denies abdominal pain She is still able to interact, answer questions  Objective   Blood pressure (!) 64/50, pulse (!) 128, temperature (!) 97.5 F (36.4 C), temperature source Oral, resp. rate 16, height 5\' 1"  (1.549 m), weight 47.6 kg, last menstrual period 06/20/2012, SpO2 99%.       No intake or output data in the 24 hours ending 03/23/2023 1247 Filed Weights   04/05/23 0512 04/06/23 0500 04/06/2023 0500  Weight: 49.7 kg 49.1 kg 47.6 kg    Examination: General: Cachectic woman, jaundiced, ill-appearing HENT: Oropharynx dry, scleral icterus present Lungs: Clear bilaterally Cardiovascular: Tachycardic, regular, no murmur Abdomen: No significant distention, no tenderness, positive bowel sounds Extremities: 1+ edema lower extremities Neuro: She is awake, a bit slow to respond but oriented and answers questions appropriately, follows commands.  Moves extremities  Resolved Hospital Problem list   E. coli UTI, treatment completed 9/18  Assessment & Plan:   Acute GI bleeding with blood loss anemia, suspect lower source given painless BRBPR although she is at risk for  NAME:  Christina Miller, MRN:  [id number], DOB:  1966-04-15, LOS: 7 ADMISSION DATE:  03/20/2023, CONSULTATION DATE: 03/27/2023 REFERRING MD: Dr. Marland Mcalpine, CHIEF COMPLAINT: GI bleeding, shock  History of Present Illness:  57 year old woman with a history of MS, alcohol abuse with hepatic steatosis/cirrhosis, hypertension, GERD, COPD.  She was admitted on 03/27/2023 with apparent alcoholic hepatitis.  She has had some improvement with corticosteroid treatment.  Paracentesis done at admission was negative for SBP.  Treated with lactulose, discontinued on 9/19.  Showed some evidence of small amount BRBPR 9/18-9/19.  Then afternoon 9/20 noted to have large-volume bleeding with clots, associated with some hemodynamic instability, tachycardia and hypotension. Hgb 10.7 >> 8.3.  Mental status remained intact.  CT angiography of the abdomen recommended by GI.  She moves now 9/20 urgently to the ICU with hemorrhagic shock.  IVF bolus initiated, vitamin K given to address mild hepatic coagulopathy (INR 1.4).  Urgent release PRBC have been ordered.  Poor IV access, single 22-gauge PIV in place.   Pertinent  Medical History   Past Medical History:  Diagnosis Date   Abnormal Pap smear    cryo   Allergy    Anemia    Bone fracture    ankle   Cancer (HCC) 1987   cervical Cancer   Colonic diverticular abscess 02/12/2019   COPD (chronic obstructive pulmonary disease) (HCC)    Emphysema    Emphysema of lung (HCC)    GERD (gastroesophageal reflux disease)    Headache(784.0)    Hypertension    Infection    urinary tract infection   MS (multiple sclerosis) (HCC)    Neuromuscular disorder (HCC)    hands and feet face and lip and muscle weakness due to MS   Pelvic abscess in female    Sepsis due to Escherichia coli (E. coli) (HCC) 03/09/2018    Significant Hospital Events: Including procedures, antibiotic start and stop dates in addition to other pertinent events   CT abdomen pelvis 03/31/2023 >> small  left effusion, benign 4 mm right middle lobe nodule.  Hepatic steatosis, thickening distal esophagus and stomach with mucosal enhancement.  No obstruction.  Prior partial colectomy with anastomotic site in the rectosigmoid.  Rectal wall thickening, ascending colon and cecum Abdominal ultrasound 9/13 >> hepatic steatosis, nodular liver contour, bidirectional portal vein flow.  Gallbladder sludge and wall thickening without any clear signs of acute cholecystitis.  Ascites present  Interim History / Subjective:  Denies abdominal pain She is still able to interact, answer questions  Objective   Blood pressure (!) 64/50, pulse (!) 128, temperature (!) 97.5 F (36.4 C), temperature source Oral, resp. rate 16, height 5\' 1"  (1.549 m), weight 47.6 kg, last menstrual period 06/20/2012, SpO2 99%.       No intake or output data in the 24 hours ending 03/23/2023 1247 Filed Weights   04/05/23 0512 04/06/23 0500 04/06/2023 0500  Weight: 49.7 kg 49.1 kg 47.6 kg    Examination: General: Cachectic woman, jaundiced, ill-appearing HENT: Oropharynx dry, scleral icterus present Lungs: Clear bilaterally Cardiovascular: Tachycardic, regular, no murmur Abdomen: No significant distention, no tenderness, positive bowel sounds Extremities: 1+ edema lower extremities Neuro: She is awake, a bit slow to respond but oriented and answers questions appropriately, follows commands.  Moves extremities  Resolved Hospital Problem list   E. coli UTI, treatment completed 9/18  Assessment & Plan:   Acute GI bleeding with blood loss anemia, suspect lower source given painless BRBPR although she is at risk for  NAME:  Christina Miller, MRN:  [id number], DOB:  1966-04-15, LOS: 7 ADMISSION DATE:  03/31/2023, CONSULTATION DATE: 03/27/2023 REFERRING MD: Dr. Marland Mcalpine, CHIEF COMPLAINT: GI bleeding, shock  History of Present Illness:  57 year old woman with a history of MS, alcohol abuse with hepatic steatosis/cirrhosis, hypertension, GERD, COPD.  She was admitted on 03/28/2023 with apparent alcoholic hepatitis.  She has had some improvement with corticosteroid treatment.  Paracentesis done at admission was negative for SBP.  Treated with lactulose, discontinued on 9/19.  Showed some evidence of small amount BRBPR 9/18-9/19.  Then afternoon 9/20 noted to have large-volume bleeding with clots, associated with some hemodynamic instability, tachycardia and hypotension. Hgb 10.7 >> 8.3.  Mental status remained intact.  CT angiography of the abdomen recommended by GI.  She moves now 9/20 urgently to the ICU with hemorrhagic shock.  IVF bolus initiated, vitamin K given to address mild hepatic coagulopathy (INR 1.4).  Urgent release PRBC have been ordered.  Poor IV access, single 22-gauge PIV in place.   Pertinent  Medical History   Past Medical History:  Diagnosis Date   Abnormal Pap smear    cryo   Allergy    Anemia    Bone fracture    ankle   Cancer (HCC) 1987   cervical Cancer   Colonic diverticular abscess 02/12/2019   COPD (chronic obstructive pulmonary disease) (HCC)    Emphysema    Emphysema of lung (HCC)    GERD (gastroesophageal reflux disease)    Headache(784.0)    Hypertension    Infection    urinary tract infection   MS (multiple sclerosis) (HCC)    Neuromuscular disorder (HCC)    hands and feet face and lip and muscle weakness due to MS   Pelvic abscess in female    Sepsis due to Escherichia coli (E. coli) (HCC) 03/09/2018    Significant Hospital Events: Including procedures, antibiotic start and stop dates in addition to other pertinent events   CT abdomen pelvis 03/31/2023 >> small  left effusion, benign 4 mm right middle lobe nodule.  Hepatic steatosis, thickening distal esophagus and stomach with mucosal enhancement.  No obstruction.  Prior partial colectomy with anastomotic site in the rectosigmoid.  Rectal wall thickening, ascending colon and cecum Abdominal ultrasound 9/13 >> hepatic steatosis, nodular liver contour, bidirectional portal vein flow.  Gallbladder sludge and wall thickening without any clear signs of acute cholecystitis.  Ascites present  Interim History / Subjective:  Denies abdominal pain She is still able to interact, answer questions  Objective   Blood pressure (!) 64/50, pulse (!) 128, temperature (!) 97.5 F (36.4 C), temperature source Oral, resp. rate 16, height 5\' 1"  (1.549 m), weight 47.6 kg, last menstrual period 06/20/2012, SpO2 99%.       No intake or output data in the 24 hours ending 03/23/2023 1247 Filed Weights   04/05/23 0512 04/06/23 0500 04/06/2023 0500  Weight: 49.7 kg 49.1 kg 47.6 kg    Examination: General: Cachectic woman, jaundiced, ill-appearing HENT: Oropharynx dry, scleral icterus present Lungs: Clear bilaterally Cardiovascular: Tachycardic, regular, no murmur Abdomen: No significant distention, no tenderness, positive bowel sounds Extremities: 1+ edema lower extremities Neuro: She is awake, a bit slow to respond but oriented and answers questions appropriately, follows commands.  Moves extremities  Resolved Hospital Problem list   E. coli UTI, treatment completed 9/18  Assessment & Plan:   Acute GI bleeding with blood loss anemia, suspect lower source given painless BRBPR although she is at risk for

## 2023-04-18 NOTE — Progress Notes (Signed)
Attempted to see pt this am. Pt actively bleeding and going to scan. Will check back as schedule allows.  Johany Hansman,OTR/L [phone]

## 2023-04-18 NOTE — Plan of Care (Signed)
  Problem: Education: Goal: Knowledge of General Education information will improve Description: Including pain rating scale, medication(s)/side effects and non-pharmacologic comfort measures Outcome: Adequate for Discharge   Problem: Health Behavior/Discharge Planning: Goal: Ability to manage health-related needs will improve Outcome: Adequate for Discharge   Problem: Clinical Measurements: Goal: Ability to maintain clinical measurements within normal limits will improve Outcome: Adequate for Discharge Goal: Will remain free from infection Outcome: Adequate for Discharge Goal: Diagnostic test results will improve Outcome: Adequate for Discharge Goal: Respiratory complications will improve Outcome: Adequate for Discharge Goal: Cardiovascular complication will be avoided Outcome: Adequate for Discharge   Problem: Activity: Goal: Risk for activity intolerance will decrease Outcome: Adequate for Discharge   Problem: Nutrition: Goal: Adequate nutrition will be maintained Outcome: Adequate for Discharge   Problem: Coping: Goal: Level of anxiety will decrease Outcome: Adequate for Discharge   Problem: Elimination: Goal: Will not experience complications related to bowel motility Outcome: Adequate for Discharge Goal: Will not experience complications related to urinary retention Outcome: Adequate for Discharge   Problem: Pain Managment: Goal: General experience of comfort will improve Outcome: Adequate for Discharge   Problem: Safety: Goal: Ability to remain free from injury will improve Outcome: Adequate for Discharge   Problem: Skin Integrity: Goal: Risk for impaired skin integrity will decrease Outcome: Adequate for Discharge   Problem: Activity: Goal: Ability to tolerate increased activity will improve Outcome: Adequate for Discharge   Problem: Respiratory: Goal: Ability to maintain a clear airway and adequate ventilation will improve Outcome: Adequate for  Discharge   Problem: Role Relationship: Goal: Method of communication will improve Outcome: Adequate for Discharge    Patient expired

## 2023-04-18 NOTE — IPAL (Signed)
Interdisciplinary Goals of Care Family Meeting   Date carried out: 04/16/2023  Location of the meeting: Conference room  Member's involved: Family Member or next of kin and Other: Physician, PA  Durable Power of Attorney or acting medical decision maker: Pt's son, Christina Miller.   Discussion: We discussed goals of care for [facility]. Unfortunately she has continued to have profuse lower GI bleeding despite IR gelfoam embolization of distal superior rectal artery. She has had multiple units of PRBC, approaching 15 along with multiple additional blood products. She continues to bleed and her BP fluctuates from SBP of 110-120 to 60s within minutes. She was intubated as she was agonal and near arrest. We then sent of labs and her Hgb returned at 3.6. I had a lengthy discussion with pt's son Christina Miller as well as her brother in the conference room. I also called her significant other, Christina Miller and updated him. We discussed todays events along with her recent health history. I explained that unfortunately she is exsanguinating despite aggressive resuscitation and IR attempts at controlling the bleed.  GI has been back to bedside and Dr. Delton Coombes has also discussed with general surgery who will review the scans in more detail now; however, they do not feel that pt is a candidate for emergent surgery.  I shared all of this information with the family. Son Christina Miller is her next of kin and is in agreement that continued resuscitation efforts would not be helpful if she is continuing to bleed to this extent. He also agrees that CPR/ACLS would be futile and not in his mothers best interest.  He has agreed to DNR orders to be placed in the chart. The plan will be to monitor for cessation of bleeding (currently 10-15 minutes without bleeding), and if it remains controlled, then can continue with blood product resuscitation. If she were to bleed heavily again, then we would not continue with blood product  resuscitation.  Code status:   Code Status: Do not attempt resuscitation (DNR) PRE-ARREST INTERVENTIONS DESIRED   Disposition: Continue current acute care  Time spent for the meeting: 30 min.   Rutherford Guys, PA - [facility] For pager details, please see AMION or use Epic chat  After 1900, please call [facility] for cross coverage needs 03/22/2023, 6:15 PM

## 2023-04-18 NOTE — Progress Notes (Signed)
PT Cancellation Note  Patient Details Name: Christina Miller MRN: [id number] DOB: 1966/06/18   Cancelled Treatment:    Reason Eval/Treat Not Completed: Medical issues which prohibited therapy RN reports bloody stool and pt likely to have scan today.  Recommends holding therapy at this time.  Will check back as schedule permits.   Janan Halter [place] 04/02/2023, 10:26 AM Paulino Door, DPT Physical Therapist Acute Rehabilitation Services Office: [phone]

## 2023-04-18 NOTE — Progress Notes (Signed)
Referring Physician(s): Byrum,R  Supervising Physician: Marliss Coots  Patient Status:  [name]  Chief Complaint:  Rectal bleeding  Subjective: Patient known to IR team from abdominal abscess drain placement in 2019 with known colonic fistula(perforated diverticulitis), abdominal abscess drain placement in 2020 (post sigmoidectomy for diverticular disease 2020) and paracentesis on 03/31/2023.  She is a 57 year old female with past medical history of anemia, diverticulosis, cervical cancer, COPD, GERD, hypertension, MS, alcohol abuse, alcoholic hepatitis, cirrhosis admitted on 9/13 with elevated LFTs, ascites, hyponatremia and anemia.  Now with lower GI bleed with hemorrhagic shock/hypotension, on pressors.  Request now received from critical care team for visceral/mesenteric/pelvic angiogram with possible embolization.  Hemoglobin 10.7, WBC 14, plts 55k, creatinine 0.38, PT 16.9, INR 1.4.   Past Medical History:  Diagnosis Date   Abnormal Pap smear    cryo   Allergy    Anemia    Bone fracture    ankle   Cancer (HCC) 1987   cervical Cancer   Colonic diverticular abscess 02/12/2019   COPD (chronic obstructive pulmonary disease) (HCC)    Emphysema    Emphysema of lung (HCC)    GERD (gastroesophageal reflux disease)    Headache(784.0)    Hypertension    Infection    urinary tract infection   MS (multiple sclerosis) (HCC)    Neuromuscular disorder (HCC)    hands and feet face and lip and muscle weakness due to MS   Pelvic abscess in female    Sepsis due to Escherichia coli (E. coli) (HCC) 03/09/2018   Past Surgical History:  Procedure Laterality Date   ARM WOUND REPAIR / CLOSURE     BREAST BIOPSY Left 07/03/2012   COLPOSCOPY     DILATION AND CURETTAGE OF UTERUS     IR CATHETER TUBE CHANGE  04/04/2018   IR CATHETER TUBE CHANGE  05/16/2018   IR CATHETER TUBE CHANGE  07/05/2018   IR CATHETER TUBE CHANGE  09/21/2018   IR RADIOLOGIST EVAL & MGMT  03/29/2018   IR  RADIOLOGIST EVAL & MGMT  04/12/2018   IR RADIOLOGIST EVAL & MGMT  02/20/2019   JP DRAIN TUBE     PROCTOSCOPY N/A 01/30/2019   Procedure: RIGID PROCTOSCOPY;  Surgeon: Karie Soda, MD;  Location: [facility] ORS;  Service: General;  Laterality: N/A;   ROBOT ASSISTED LAPAROSCOPIC PARTIAL COLECTOMY  01/30/2019   for diverticulitis.  LSO as well   SALPINGOOPHORECTOMY Left 01/30/2019   LSO at time of sigmoid colectomy   TUBAL LIGATION        Allergies: Hydrocodone and Bactrim [sulfamethoxazole-trimethoprim]  Medications: Prior to Admission medications   Medication Sig Start Date End Date Taking? Authorizing Provider  albuterol (VENTOLIN HFA) 108 (90 Base) MCG/ACT inhaler Inhale 2 puffs into the lungs every 6 (six) hours as needed for wheezing or shortness of breath. Patient not taking: Reported on 03/31/2023 08/22/19   Marcine Matar, MD  amLODipine (NORVASC) 10 MG tablet Take 1 tablet (10 mg total) by mouth daily. Patient not taking: Reported on 03/31/2023 04/02/20   Marcine Matar, MD  famotidine (PEPCID) 20 MG tablet Take 1 tablet (20 mg total) by mouth 2 (two) times daily as needed for heartburn or indigestion. Patient not taking: Reported on 03/31/2023 03/04/20   Charlynne Pander, MD  hydrochlorothiazide (HYDRODIURIL) 12.5 MG tablet Take 1 tablet (12.5 mg total) by mouth daily. Patient not taking: Reported on 03/31/2023 09/15/20   Marcine Matar, MD  nicotine (NICODERM CQ - DOSED IN  MG/24 HOURS) 21 mg/24hr patch Place 1 patch (21 mg total) onto the skin daily. Patient not taking: Reported on 03/31/2023 04/02/20   Marcine Matar, MD  nicotine polacrilex (NICORETTE) 2 MG gum Take 1 each (2 mg total) by mouth as needed for smoking cessation. Max 30 pieces/day Patient not taking: Reported on 03/31/2023 05/14/20   Marcine Matar, MD     Vital Signs: BP (!) 151/116   Pulse (!) 128   Temp (!) 97.5 F (36.4 C) (Oral)   Resp 19   Ht 5\' 1"  (1.549 m)   Wt 104 lb 15 oz (47.6 kg)   LMP  06/20/2012   SpO2 100%   BMI 19.83 kg/m   Physical Exam: Patient awake but lethargic.  Slow to respond.  Follows some commands.  Shivering.  Chest with slightly diminished breath sounds left base, right clear.  Heart with tachycardic but regular rhythm.  Abdomen soft, positive bowel sounds, nontender.  Trace to 1+ pretibial edema bilat  Imaging: DG CHEST PORT 1 VIEW  Result Date: 04/20/23 CLINICAL DATA:  Encounter for central line placement. EXAM: PORTABLE CHEST 1 VIEW COMPARISON:  AP chest 04/02/2023, chest two views 03/31/2023 FINDINGS: New left subclavian central venous catheter with tip overlying the mid to central superior vena cava. Cardiac silhouette and mediastinal contours are within normal limits. Small left pleural effusion appears decreased from 04/02/2023, and more similar to 03/31/2023. Some of these differences may be due to differences in patient positioning. No pneumothorax is seen. Multiple wires overlie the lower chest. Electronic device overlies the midline lower chest. No acute skeletal abnormality. IMPRESSION: 1. New left subclavian central venous catheter with tip overlying the mid to central superior vena cava. No pneumothorax. 2. Small left pleural effusion appears decreased from 04/02/2023, and more similar to 03/31/2023. Electronically Signed   By: Neita Garnet M.D.   On: 04/20/23 14:50    Labs:  CBC: Recent Labs    04/05/23 0430 04/06/23 0510 04/06/23 1735 Apr 20, 2023 0434 April 20, 2023 1220  WBC 13.3* 14.6* 12.6* 14.0*  --   HGB 11.3* 12.2 11.7* 10.7* 8.3*  HCT 34.6* 36.7 36.9 33.2* 26.6*  PLT 51* 53* 60* 55*  --     COAGS: Recent Labs    04/05/23 0430 04/06/23 0510 04/06/23 2103 04-20-2023 0434  INR 1.5* 1.3* 1.3* 1.4*    BMP: Recent Labs    04/05/23 0430 04/06/23 0510 20-Apr-2023 0434 April 20, 2023 1220  NA 128* 129* 128* 132*  K 3.8 4.4 4.2 4.6  CL 99 98 98 103  CO2 21* 24 24 21*  GLUCOSE 119* 100* 82 90  BUN 24* 26* 26* 25*  CALCIUM 7.7* 8.0*  7.7* 7.2*  CREATININE <0.30* <0.30* 0.31* 0.38*  GFRNONAA NOT CALCULATED NOT CALCULATED >60 >60    LIVER FUNCTION TESTS: Recent Labs    04/04/23 0425 04/05/23 0430 04/06/23 0510 04/20/23 0434  BILITOT 7.4* 5.9* 5.9* 5.2*  AST 187* 114* 89* 75*  ALT 117* 96* 85* 74*  ALKPHOS 203* 200* 200* 169*  PROT 5.0* 5.1* 5.5* 4.9*  ALBUMIN 2.1* 2.0* 2.2* 1.9*    Assessment and Plan: 57 year old female with past medical history of anemia, diverticulosis, cervical cancer, COPD, GERD, hypertension, MS, alcohol abuse, alcoholic hepatitis, cirrhosis admitted on 9/13 with elevated LFTs, ascites, hyponatremia and anemia. She is s/p abdominal abscess drain placement in 2019 with known colonic fistula(perforated diverticulitis), abdominal abscess drain placement in 2020 (post sigmoidectomy for diverticular disease 2020) and paracentesis on 03/31/2023.   Now with lower  GI bleed with hemorrhagic shock/hypotension, on pressors.  Request now received from critical care team for visceral/mesenteric/pelvic angiogram with possible embolization.  Imaging studies/CT angio have been reviewed by Dr. Elby Showers.Risks and benefits of procedure were discussed with the patient's sig other Christina Miller  including, but not limited to bleeding, infection, vascular injury or contrast induced renal failure.  This interventional procedure involves the use of X-rays and because of the nature of the planned procedure, it is possible that we will have prolonged use of X-ray fluoroscopy.  Potential radiation risks to you include (but are not limited to) the following: - A slightly elevated risk for cancer  several years later in life. This risk is typically less than 0.5% percent. This risk is low in comparison to the normal incidence of human cancer, which is 33% for women and 50% for men according to the [employer]. - Radiation induced injury can include skin redness, resembling a rash, tissue breakdown / ulcers and hair  loss (which can be temporary or permanent).   The likelihood of either of these occurring depends on the difficulty of the procedure and whether you are sensitive to radiation due to previous procedures, disease, or genetic conditions.   IF your procedure requires a prolonged use of radiation, you will be notified and given written instructions for further action.  It is your responsibility to monitor the irradiated area for the 2 weeks following the procedure and to notify your physician if you are concerned that you have suffered a radiation induced injury.    All of the patient's questions were answered, patient is agreeable to proceed.  Consent signed and in chart.  Procedure scheduled ASAP today      Electronically Signed: D. Jeananne Rama, PA-C 04/13/2023, 3:44 PM   I spent a total of 25 Minutes at the the patient's bedside AND on the patient's hospital floor or unit, greater than 50% of which was counseling/coordinating care for visceral/mesenteric/pelvic arteriogram with possible embolization    Patient ID: Christina Miller, female   DOB: 02-20-1966, 57 y.o.   MRN: [id number]

## 2023-04-18 NOTE — Procedures (Signed)
Central Venous Catheter Insertion Procedure Note  Darilyn Nolte  [id number]  01/03/1966  Date:04/12/2023  Time:1:10 PM   Provider Performing:Averee Harb Celine Mans   Procedure: Insertion of Non-tunneled Central Venous Catheter(36556) without US guidance  Indication(s) Medication administration and Difficult access  Consent Risks of the procedure as well as the alternatives and risks of each were explained to the patient and/or caregiver.  Consent for the procedure was obtained and is signed in the bedside chart  Anesthesia Topical only with 1% lidocaine   Timeout Verified patient identification, verified procedure, site/side was marked, verified correct patient position, special equipment/implants available, medications/allergies/relevant history reviewed, required imaging and test results available.  Sterile Technique Maximal sterile technique including full sterile barrier drape, hand hygiene, sterile gown, sterile gloves, mask, hair covering, sterile ultrasound probe cover (if used).  Procedure Description Area of catheter insertion was cleaned with chlorhexidine and draped in sterile fashion.  Without real-time ultrasound guidance a central venous catheter was placed into the left subclavian vein. Nonpulsatile blood flow and easy flushing noted in all ports.  The catheter was sutured in place and sterile dressing applied.  Complications/Tolerance None; patient tolerated the procedure well. Chest X-ray is ordered to verify placement for internal jugular or subclavian cannulation.   Chest x-ray is not ordered for femoral cannulation.  EBL Minimal  Specimen(s) None    Rutherford Guys, PA - C [facility] For pager details, please see AMION or use Epic chat  After 1900, please call [facility] for cross coverage needs 04/01/2023, 1:10 PM

## 2023-04-18 NOTE — Progress Notes (Signed)
Chaplain was paged to support family members following pt death. They shared stories of time spent with pt, and received comfort from reflective listening and prayer.  Chaplain [place], [place]   03/20/2023 2200  Spiritual Encounters  Type of Visit Follow up  Care provided to: [facility] partners present during encounter Nurse  Referral source Nurse (RN/NT/LPN)  Reason for visit Grief/loss  OnCall Visit Yes  Spiritual Framework  Presenting Themes Impactful experiences and emotions  Community/Connection Family;Faith community  Patient Stress Factors None identified  Family Stress Factors Loss  Interventions  Spiritual Care Interventions Made Established relationship of care and support;Compassionate presence;Reflective listening;Normalization of emotions;Prayer  Intervention Outcomes  Outcomes Reduced anxiety;Awareness around self/spiritual resourses;Connection to spiritual care

## 2023-04-18 NOTE — Procedures (Signed)
Interventional Radiology Procedure Note  Procedure:  1) Superior mesenteric angiogram 2) Superior rectal arteriogram 3) Gelfoam embolization of distal superior rectal artery 4) Bilateral pelvic angiogram  Findings: Please refer to procedural dictation for full description.  Gelfoam embolization of distal superior rectal artery near colonic anastomosis at site of hyperemia, no active extravasation.  5 Fr right CFA sheath left in place for transduction purposes at request of ICU team.  Complications: None immediate  Estimated Blood Loss: < 5 mL  Recommendations: Agree with continued resuscitation efforts. IR will follow.   Marliss Coots, MD

## 2023-04-18 NOTE — Death Summary Note (Signed)
DEATH SUMMARY   Patient Details  Name: Christina Miller MRN: [id number] DOB: 09-Oct-1965  Admission/Discharge Information   Admit Date:  Mar 31, 2023  Date of Death: Date of Death: 04/08/23  Time of Death: Time of Death: 07-Jun-2117  Length of Stay: 7  Referring Physician: Marcine Matar, MD   Reason(s) for Hospitalization  Acute alcoholic hepatitis  Diagnoses  Preliminary cause of death:  Hemorrhagic shock due to GI bleed  Secondary Diagnoses (including complications and co-morbidities):  Principal Problem:   Hemorrhagic shock (HCC) Active Problems:   Tobacco abuse   COPD (chronic obstructive pulmonary disease) (HCC)   Multiple sclerosis (HCC)   Subacute liver failure   Ascites due to alcoholic cirrhosis (HCC)   Alcoholic hepatitis with ascites   Esophageal thickening   Abnormal CT scan, colon   Abdominal pain, generalized   Pressure injury of skin   Alcohol abuse   Acute cystitis   Macrocytic anemia   Hyponatremia   Acute hepatic encephalopathy (HCC)   Acute respiratory failure with hypoxia (HCC)   Acute lower GI bleeding   Brief Hospital Course (including significant findings, care, treatment, and services provided and events leading to death)  Christina Miller is a 57 y.o. year old female with a history of MS, alcohol abuse with hepatic steatosis/cirrhosis, hypertension, GERD, COPD.  She was admitted on 03/31/23 with apparent alcoholic hepatitis.  She has had some improvement with corticosteroid treatment.  Paracentesis done at admission was negative for SBP.  Treated with lactulose, discontinued on 9/19.  Showed some evidence of small amount BRBPR 9/18-9/19.  Then afternoon 04/08/2023 noted to have large-volume bleeding with clots, associated with some hemodynamic instability, tachycardia and hypotension. Hgb 10.7 >> 8.3.  Mental status remained intact.  CT angiography of the abdomen recommended by GI.  She moved Apr 08, 2023 urgently to the ICU with hemorrhagic shock.  IVF  bolus initiated, vitamin K given to address mild hepatic coagulopathy (INR 1.4).  Urgent release PRBC have been ordered.  Poor IV access, single 22-gauge PIV in place.  Central IV access was obtained to facilitate massive transfusion protocol.  Multiple units PRBC, platelets, FFP, cryo were given.  She was started on norepinephrine, titrated to high dose.  She went emergently for CT angiography and then straight to interventional radiology Apr 08, 2023 after a focus of active arterial extravasation was seen at the level of the rectum/anus regional to the level of her prior enteric anastomosis.  Arterial embolization was performed emergently.  She was intubated to facilitate mechanical ventilation, airway protection.  Despite maximal support and efforts at embolization of the distal superior rectal artery the patient continued to have massive lower GI blood loss.  Surgical consultation was obtained but unfortunately the patient was deemed too unstable to survive surgical intervention.  Her shock progressed despite every effort at volume and blood resuscitation.  She passed away later on 04-08-23 due to uncontrollable GI bleeding and associated hemorrhagic shock.  Pertinent Labs and Studies  Significant Diagnostic Studies IR Angiogram Visceral Selective  Result Date: 04/08/23 INDICATION: 57 year old female with history of alcoholic cirrhosis and new onset hematochezia with associated hemorrhagic shock and CTA findings of hyperemia about the rectum. EXAM: 1. Ultrasound-guided vascular access of the right common femoral artery. 2. Selective catheterization and angiography of the superior mesenteric artery, superior rectal artery, and bilateral internal iliac arteries. 3. Gel-Foam embolization of the distal superior rectal artery. MEDICATIONS: The patient was receiving massive blood transfusion throughout the procedure, managed by the ICU team. ANESTHESIA/SEDATION: The patient's  level of consciousness and vital signs  were monitored continuously by radiology nursing as well as the ICU team throughout the procedure under my direct supervision. CONTRAST:  20mL OMNIPAQUE IOHEXOL 300 MG/ML SOLN, 10mL OMNIPAQUE IOHEXOL 300 MG/ML SOLN, 20mL OMNIPAQUE IOHEXOL 300 MG/ML SOLN FLUOROSCOPY: Radiation Exposure Index (as provided by the fluoroscopic device): 1,886 mGy Kerma COMPLICATIONS: None immediate. PROCEDURE: Informed consent was obtained from the patient following explanation of the procedure, risks, benefits and alternatives. The patient understands, agrees and consents for the procedure. All questions were addressed. A time out was performed prior to the initiation of the procedure. Maximal barrier sterile technique utilized including caps, mask, sterile gowns, sterile gloves, large sterile drape, hand hygiene, and Betadine prep. Preprocedure ultrasound evaluation demonstrated patency of the right common femoral artery. The procedure was planned. Subdermal Local anesthesia was administered with 1% lidocaine at the planned needle entry site. A small skin nick was made. Under direct ultrasound visualization, the right common femoral artery was accessed with a 21 gauge micropuncture needle. Micropuncture sheath with is then inserted and limited right lower extremity angiogram was performed which demonstrated adequate puncture site for closure device use. A J wire was inserted and directed under fluoroscopic guidance to the proximal abdominal aorta. The micropuncture sheath was exchanged for a 5 [place], 10 cm vascular sheath. A C2 catheter was then directed to the proximal abdominal aorta and used to select the superior mesenteric artery. Superior mesenteric angiogram was performed which demonstrated patency throughout. The superior rectal artery was identified arising from a branch of the proximal superior mesenteric artery. There is vessel irregularity and hyperemia about the rectum near the site of surgical anastomosis. Attempts were  made at selecting the proximal superior rectal artery branch with a 2.4 [place] Progreat microcatheter and Aristotle 14 microwire which was unsuccessful. Next, under fluoroscopic guidance, a 1.9 [place] Progreat lambda microcatheter with triple angle tip was then used to select the superior rectal artery proximally with wireless technique. The catheter was then advanced with the wire to the more distal portion and repeat superior rectal angiogram was performed. Again, there is diffuse hyperemia about the rectum near the site of surgical anastomosis staples. There is no active extravasation visualized. Therefore, under intermittent fluoroscopic guidance, approximately 5 cc of Gel-Foam slurry were administered. No reflux was observed. The catheter was retracted slightly and completion angiogram was performed which demonstrated adequate embolization of the distal superior rectal artery without evidence of persistent hyperemia or active extravasation. The microcatheter was removed. The C2 catheter was then exchanged over a J wire for an Omni Flush catheter. The Omni Flush catheter was used to select the left internal iliac artery with the assistance of a Glidewire. The catheter was exchanged for the C2 catheter. Dedicated left internal iliac angiogram was performed in multiple obliquities which demonstrated no evidence of significant rectal supply. Using a Waltman loop technique, the catheter was then directed into the right internal iliac artery. Dedicated right internal iliac angiogram was then performed in multiple obliquities which demonstrated no evidence of significant rectal supply. The catheter was then removed. At the request of the ICU team, the right common femoral artery sheath was left in place and affixed to the skin with an interrupted 0 silk suture. A sterile bandage was applied. The patient tolerated procedure well and was transferred back to the ICU in guarded condition. IMPRESSION: 1. Hyperemia about  the rectum at the site of prior colocolonic anastomosis. No active extravasation was visualized. 2. Technically successful Gel-Foam embolization  of the distal superior rectal artery. Marliss Coots, MD [facility] Radiology Electronically Signed   By: Marliss Coots M.D.   On: 2023/04/27 22:04   IR US Guide Vasc Access Right  Result Date: 04/27/2023 INDICATION: 57 year old female with history of alcoholic cirrhosis and new onset hematochezia with associated hemorrhagic shock and CTA findings of hyperemia about the rectum. EXAM: 1. Ultrasound-guided vascular access of the right common femoral artery. 2. Selective catheterization and angiography of the superior mesenteric artery, superior rectal artery, and bilateral internal iliac arteries. 3. Gel-Foam embolization of the distal superior rectal artery. MEDICATIONS: The patient was receiving massive blood transfusion throughout the procedure, managed by the ICU team. ANESTHESIA/SEDATION: The patient's level of consciousness and vital signs were monitored continuously by radiology nursing as well as the ICU team throughout the procedure under my direct supervision. CONTRAST:  20mL OMNIPAQUE IOHEXOL 300 MG/ML SOLN, 10mL OMNIPAQUE IOHEXOL 300 MG/ML SOLN, 20mL OMNIPAQUE IOHEXOL 300 MG/ML SOLN FLUOROSCOPY: Radiation Exposure Index (as provided by the fluoroscopic device): 1,886 mGy Kerma COMPLICATIONS: None immediate. PROCEDURE: Informed consent was obtained from the patient following explanation of the procedure, risks, benefits and alternatives. The patient understands, agrees and consents for the procedure. All questions were addressed. A time out was performed prior to the initiation of the procedure. Maximal barrier sterile technique utilized including caps, mask, sterile gowns, sterile gloves, large sterile drape, hand hygiene, and Betadine prep. Preprocedure ultrasound evaluation demonstrated patency of the right  common femoral artery. The procedure was planned. Subdermal Local anesthesia was administered with 1% lidocaine at the planned needle entry site. A small skin nick was made. Under direct ultrasound visualization, the right common femoral artery was accessed with a 21 gauge micropuncture needle. Micropuncture sheath with is then inserted and limited right lower extremity angiogram was performed which demonstrated adequate puncture site for closure device use. A J wire was inserted and directed under fluoroscopic guidance to the proximal abdominal aorta. The micropuncture sheath was exchanged for a 5 [place], 10 cm vascular sheath. A C2 catheter was then directed to the proximal abdominal aorta and used to select the superior mesenteric artery. Superior mesenteric angiogram was performed which demonstrated patency throughout. The superior rectal artery was identified arising from a branch of the proximal superior mesenteric artery. There is vessel irregularity and hyperemia about the rectum near the site of surgical anastomosis. Attempts were made at selecting the proximal superior rectal artery branch with a 2.4 [place] Progreat microcatheter and Aristotle 14 microwire which was unsuccessful. Next, under fluoroscopic guidance, a 1.9 [place] Progreat lambda microcatheter with triple angle tip was then used to select the superior rectal artery proximally with wireless technique. The catheter was then advanced with the wire to the more distal portion and repeat superior rectal angiogram was performed. Again, there is diffuse hyperemia about the rectum near the site of surgical anastomosis staples. There is no active extravasation visualized. Therefore, under intermittent fluoroscopic guidance, approximately 5 cc of Gel-Foam slurry were administered. No reflux was observed. The catheter was retracted slightly and completion angiogram was performed which demonstrated adequate embolization of the distal superior rectal artery  without evidence of persistent hyperemia or active extravasation. The microcatheter was removed. The C2 catheter was then exchanged over a J wire for an Omni Flush catheter. The Omni Flush catheter was used to select the left internal iliac artery with the assistance of a Glidewire. The catheter was exchanged for the C2 catheter. Dedicated left internal iliac angiogram was performed  in multiple obliquities which demonstrated no evidence of significant rectal supply. Using a Waltman loop technique, the catheter was then directed into the right internal iliac artery. Dedicated right internal iliac angiogram was then performed in multiple obliquities which demonstrated no evidence of significant rectal supply. The catheter was then removed. At the request of the ICU team, the right common femoral artery sheath was left in place and affixed to the skin with an interrupted 0 silk suture. A sterile bandage was applied. The patient tolerated procedure well and was transferred back to the ICU in guarded condition. IMPRESSION: 1. Hyperemia about the rectum at the site of prior colocolonic anastomosis. No active extravasation was visualized. 2. Technically successful Gel-Foam embolization of the distal superior rectal artery. Marliss Coots, MD [facility] Radiology Electronically Signed   By: Marliss Coots M.D.   On: 04/12/2023 22:04   IR Angiogram Selective Each Additional Vessel  Result Date: 04/09/2023 INDICATION: 57 year old female with history of alcoholic cirrhosis and new onset hematochezia with associated hemorrhagic shock and CTA findings of hyperemia about the rectum. EXAM: 1. Ultrasound-guided vascular access of the right common femoral artery. 2. Selective catheterization and angiography of the superior mesenteric artery, superior rectal artery, and bilateral internal iliac arteries. 3. Gel-Foam embolization of the distal superior rectal artery. MEDICATIONS: The  patient was receiving massive blood transfusion throughout the procedure, managed by the ICU team. ANESTHESIA/SEDATION: The patient's level of consciousness and vital signs were monitored continuously by radiology nursing as well as the ICU team throughout the procedure under my direct supervision. CONTRAST:  20mL OMNIPAQUE IOHEXOL 300 MG/ML SOLN, 10mL OMNIPAQUE IOHEXOL 300 MG/ML SOLN, 20mL OMNIPAQUE IOHEXOL 300 MG/ML SOLN FLUOROSCOPY: Radiation Exposure Index (as provided by the fluoroscopic device): 1,886 mGy Kerma COMPLICATIONS: None immediate. PROCEDURE: Informed consent was obtained from the patient following explanation of the procedure, risks, benefits and alternatives. The patient understands, agrees and consents for the procedure. All questions were addressed. A time out was performed prior to the initiation of the procedure. Maximal barrier sterile technique utilized including caps, mask, sterile gowns, sterile gloves, large sterile drape, hand hygiene, and Betadine prep. Preprocedure ultrasound evaluation demonstrated patency of the right common femoral artery. The procedure was planned. Subdermal Local anesthesia was administered with 1% lidocaine at the planned needle entry site. A small skin nick was made. Under direct ultrasound visualization, the right common femoral artery was accessed with a 21 gauge micropuncture needle. Micropuncture sheath with is then inserted and limited right lower extremity angiogram was performed which demonstrated adequate puncture site for closure device use. A J wire was inserted and directed under fluoroscopic guidance to the proximal abdominal aorta. The micropuncture sheath was exchanged for a 5 [place], 10 cm vascular sheath. A C2 catheter was then directed to the proximal abdominal aorta and used to select the superior mesenteric artery. Superior mesenteric angiogram was performed which demonstrated patency throughout. The superior rectal artery was identified  arising from a branch of the proximal superior mesenteric artery. There is vessel irregularity and hyperemia about the rectum near the site of surgical anastomosis. Attempts were made at selecting the proximal superior rectal artery branch with a 2.4 [place] Progreat microcatheter and Aristotle 14 microwire which was unsuccessful. Next, under fluoroscopic guidance, a 1.9 [place] Progreat lambda microcatheter with triple angle tip was then used to select the superior rectal artery proximally with wireless technique. The catheter was then advanced with the wire to the more distal portion and repeat superior rectal  angiogram was performed. Again, there is diffuse hyperemia about the rectum near the site of surgical anastomosis staples. There is no active extravasation visualized. Therefore, under intermittent fluoroscopic guidance, approximately 5 cc of Gel-Foam slurry were administered. No reflux was observed. The catheter was retracted slightly and completion angiogram was performed which demonstrated adequate embolization of the distal superior rectal artery without evidence of persistent hyperemia or active extravasation. The microcatheter was removed. The C2 catheter was then exchanged over a J wire for an Omni Flush catheter. The Omni Flush catheter was used to select the left internal iliac artery with the assistance of a Glidewire. The catheter was exchanged for the C2 catheter. Dedicated left internal iliac angiogram was performed in multiple obliquities which demonstrated no evidence of significant rectal supply. Using a Waltman loop technique, the catheter was then directed into the right internal iliac artery. Dedicated right internal iliac angiogram was then performed in multiple obliquities which demonstrated no evidence of significant rectal supply. The catheter was then removed. At the request of the ICU team, the right common femoral artery sheath was left in place and affixed to the skin with an  interrupted 0 silk suture. A sterile bandage was applied. The patient tolerated procedure well and was transferred back to the ICU in guarded condition. IMPRESSION: 1. Hyperemia about the rectum at the site of prior colocolonic anastomosis. No active extravasation was visualized. 2. Technically successful Gel-Foam embolization of the distal superior rectal artery. Marliss Coots, MD [facility] Radiology Electronically Signed   By: Marliss Coots M.D.   On: 16-Apr-2023 22:04   IR EMBO ART  VEN HEMORR LYMPH EXTRAV  INC GUIDE ROADMAPPING  Result Date: April 16, 2023 INDICATION: 56 year old female with history of alcoholic cirrhosis and new onset hematochezia with associated hemorrhagic shock and CTA findings of hyperemia about the rectum. EXAM: 1. Ultrasound-guided vascular access of the right common femoral artery. 2. Selective catheterization and angiography of the superior mesenteric artery, superior rectal artery, and bilateral internal iliac arteries. 3. Gel-Foam embolization of the distal superior rectal artery. MEDICATIONS: The patient was receiving massive blood transfusion throughout the procedure, managed by the ICU team. ANESTHESIA/SEDATION: The patient's level of consciousness and vital signs were monitored continuously by radiology nursing as well as the ICU team throughout the procedure under my direct supervision. CONTRAST:  20mL OMNIPAQUE IOHEXOL 300 MG/ML SOLN, 10mL OMNIPAQUE IOHEXOL 300 MG/ML SOLN, 20mL OMNIPAQUE IOHEXOL 300 MG/ML SOLN FLUOROSCOPY: Radiation Exposure Index (as provided by the fluoroscopic device): 1,886 mGy Kerma COMPLICATIONS: None immediate. PROCEDURE: Informed consent was obtained from the patient following explanation of the procedure, risks, benefits and alternatives. The patient understands, agrees and consents for the procedure. All questions were addressed. A time out was performed prior to the initiation of the procedure. Maximal  barrier sterile technique utilized including caps, mask, sterile gowns, sterile gloves, large sterile drape, hand hygiene, and Betadine prep. Preprocedure ultrasound evaluation demonstrated patency of the right common femoral artery. The procedure was planned. Subdermal Local anesthesia was administered with 1% lidocaine at the planned needle entry site. A small skin nick was made. Under direct ultrasound visualization, the right common femoral artery was accessed with a 21 gauge micropuncture needle. Micropuncture sheath with is then inserted and limited right lower extremity angiogram was performed which demonstrated adequate puncture site for closure device use. A J wire was inserted and directed under fluoroscopic guidance to the proximal abdominal aorta. The micropuncture sheath was exchanged for a 5 [place], 10 cm vascular  sheath. A C2 catheter was then directed to the proximal abdominal aorta and used to select the superior mesenteric artery. Superior mesenteric angiogram was performed which demonstrated patency throughout. The superior rectal artery was identified arising from a branch of the proximal superior mesenteric artery. There is vessel irregularity and hyperemia about the rectum near the site of surgical anastomosis. Attempts were made at selecting the proximal superior rectal artery branch with a 2.4 [place] Progreat microcatheter and Aristotle 14 microwire which was unsuccessful. Next, under fluoroscopic guidance, a 1.9 [place] Progreat lambda microcatheter with triple angle tip was then used to select the superior rectal artery proximally with wireless technique. The catheter was then advanced with the wire to the more distal portion and repeat superior rectal angiogram was performed. Again, there is diffuse hyperemia about the rectum near the site of surgical anastomosis staples. There is no active extravasation visualized. Therefore, under intermittent fluoroscopic guidance, approximately 5 cc of  Gel-Foam slurry were administered. No reflux was observed. The catheter was retracted slightly and completion angiogram was performed which demonstrated adequate embolization of the distal superior rectal artery without evidence of persistent hyperemia or active extravasation. The microcatheter was removed. The C2 catheter was then exchanged over a J wire for an Omni Flush catheter. The Omni Flush catheter was used to select the left internal iliac artery with the assistance of a Glidewire. The catheter was exchanged for the C2 catheter. Dedicated left internal iliac angiogram was performed in multiple obliquities which demonstrated no evidence of significant rectal supply. Using a Waltman loop technique, the catheter was then directed into the right internal iliac artery. Dedicated right internal iliac angiogram was then performed in multiple obliquities which demonstrated no evidence of significant rectal supply. The catheter was then removed. At the request of the ICU team, the right common femoral artery sheath was left in place and affixed to the skin with an interrupted 0 silk suture. A sterile bandage was applied. The patient tolerated procedure well and was transferred back to the ICU in guarded condition. IMPRESSION: 1. Hyperemia about the rectum at the site of prior colocolonic anastomosis. No active extravasation was visualized. 2. Technically successful Gel-Foam embolization of the distal superior rectal artery. Marliss Coots, MD [facility] Radiology Electronically Signed   By: Marliss Coots M.D.   On: Apr 13, 2023 22:04   IR Angiogram Extremity Bilateral  Result Date: 04/13/23 INDICATION: 57 year old female with history of alcoholic cirrhosis and new onset hematochezia with associated hemorrhagic shock and CTA findings of hyperemia about the rectum. EXAM: 1. Ultrasound-guided vascular access of the right common femoral artery. 2. Selective  catheterization and angiography of the superior mesenteric artery, superior rectal artery, and bilateral internal iliac arteries. 3. Gel-Foam embolization of the distal superior rectal artery. MEDICATIONS: The patient was receiving massive blood transfusion throughout the procedure, managed by the ICU team. ANESTHESIA/SEDATION: The patient's level of consciousness and vital signs were monitored continuously by radiology nursing as well as the ICU team throughout the procedure under my direct supervision. CONTRAST:  20mL OMNIPAQUE IOHEXOL 300 MG/ML SOLN, 10mL OMNIPAQUE IOHEXOL 300 MG/ML SOLN, 20mL OMNIPAQUE IOHEXOL 300 MG/ML SOLN FLUOROSCOPY: Radiation Exposure Index (as provided by the fluoroscopic device): 1,886 mGy Kerma COMPLICATIONS: None immediate. PROCEDURE: Informed consent was obtained from the patient following explanation of the procedure, risks, benefits and alternatives. The patient understands, agrees and consents for the procedure. All questions were addressed. A time out was performed prior to the initiation of the procedure. Maximal barrier  sterile technique utilized including caps, mask, sterile gowns, sterile gloves, large sterile drape, hand hygiene, and Betadine prep. Preprocedure ultrasound evaluation demonstrated patency of the right common femoral artery. The procedure was planned. Subdermal Local anesthesia was administered with 1% lidocaine at the planned needle entry site. A small skin nick was made. Under direct ultrasound visualization, the right common femoral artery was accessed with a 21 gauge micropuncture needle. Micropuncture sheath with is then inserted and limited right lower extremity angiogram was performed which demonstrated adequate puncture site for closure device use. A J wire was inserted and directed under fluoroscopic guidance to the proximal abdominal aorta. The micropuncture sheath was exchanged for a 5 [place], 10 cm vascular sheath. A C2 catheter was then directed to  the proximal abdominal aorta and used to select the superior mesenteric artery. Superior mesenteric angiogram was performed which demonstrated patency throughout. The superior rectal artery was identified arising from a branch of the proximal superior mesenteric artery. There is vessel irregularity and hyperemia about the rectum near the site of surgical anastomosis. Attempts were made at selecting the proximal superior rectal artery branch with a 2.4 [place] Progreat microcatheter and Aristotle 14 microwire which was unsuccessful. Next, under fluoroscopic guidance, a 1.9 [place] Progreat lambda microcatheter with triple angle tip was then used to select the superior rectal artery proximally with wireless technique. The catheter was then advanced with the wire to the more distal portion and repeat superior rectal angiogram was performed. Again, there is diffuse hyperemia about the rectum near the site of surgical anastomosis staples. There is no active extravasation visualized. Therefore, under intermittent fluoroscopic guidance, approximately 5 cc of Gel-Foam slurry were administered. No reflux was observed. The catheter was retracted slightly and completion angiogram was performed which demonstrated adequate embolization of the distal superior rectal artery without evidence of persistent hyperemia or active extravasation. The microcatheter was removed. The C2 catheter was then exchanged over a J wire for an Omni Flush catheter. The Omni Flush catheter was used to select the left internal iliac artery with the assistance of a Glidewire. The catheter was exchanged for the C2 catheter. Dedicated left internal iliac angiogram was performed in multiple obliquities which demonstrated no evidence of significant rectal supply. Using a Waltman loop technique, the catheter was then directed into the right internal iliac artery. Dedicated right internal iliac angiogram was then performed in multiple obliquities which  demonstrated no evidence of significant rectal supply. The catheter was then removed. At the request of the ICU team, the right common femoral artery sheath was left in place and affixed to the skin with an interrupted 0 silk suture. A sterile bandage was applied. The patient tolerated procedure well and was transferred back to the ICU in guarded condition. IMPRESSION: 1. Hyperemia about the rectum at the site of prior colocolonic anastomosis. No active extravasation was visualized. 2. Technically successful Gel-Foam embolization of the distal superior rectal artery. Marliss Coots, MD [facility] Electronically Signed   By: Marliss Coots M.D.   On: 04/15/2023 22:04   CT ANGIO GI BLEED  Result Date: 03/19/2023 CLINICAL DATA:  Lower GI bleeding.  History alcoholic cirrhosis. EXAM: CTA ABDOMEN AND PELVIS WITHOUT AND WITH CONTRAST TECHNIQUE: Multidetector CT imaging of the abdomen and pelvis was performed using the standard protocol during bolus administration of intravenous contrast. Multiplanar reconstructed images and MIPs were obtained and reviewed to evaluate the vascular anatomy. RADIATION DOSE REDUCTION: This exam was performed according to the departmental dose-optimization  program which includes automated exposure control, adjustment of the mA and/or kV according to patient size and/or use of iterative reconstruction technique. CONTRAST:  OMNIPAQUE IOHEXOL 350 MG/ML SOLN COMPARISON:  CT abdomen and pelvis-03/31/2023 FINDINGS: VASCULAR Aorta: There is a moderate amount of mixed calcified and noncalcified atherosclerotic plaque throughout the abdominal aorta, not resulting in a hemodynamically significant stenosis. Celiac: Widely patent without hemodynamically significant narrowing. Conventional branching pattern. SMA: Widely patent without hemodynamically significant narrowing. The distal tributaries of the SMA appear widely patent. The area  suspected bleeding from the distal colonic anastomosis appears to be supplied via a branch from the SMA which runs along side a prominent SMV branch. Renals: Solitary bilaterally; widely patent without hemodynamically significant narrowing. No vessel irregularity to suggest FMD. IMA: Potentially congenitally absent. Inflow: There is a minimal amount of predominantly noncalcified atherosclerotic plaque involving the bilateral normal caliber common iliac arteries, not resulting in hemodynamically significant stenosis. Both the external and internal iliac arteries are slightly diminutive, likely secondary to hypovolemic state, though without a hemodynamically significant narrowing. Proximal Outflow: The bilateral common and imaged portions of the bilateral deep and superficial femoral arteries are slightly diminutive, likely secondary to hypovolemic state, though without hemodynamically significant narrowing. Veins: No significantly hypertrophied gastric or esophageal varices. No evidence of splenorenal shunt. Review of the MIP images confirms the above findings. _________________________________________________________ NON-VASCULAR Lower chest: Limited visualization of the lower thorax demonstrates a small left-sided pleural effusion with associated left basilar atelectasis and associated air bronchograms. Normal heart size. No pericardial effusion. Hepatobiliary: Nodularity of the hepatic contour. There is diffuse decreased attenuation of the hepatic parenchyma. Geographic enhancement adjacent to the fissure for the ligamentum teres with associated recanalization of the periumbilical vein. There is additional altered perfusion within the caudate. Given this limitation, there are no discrete hyperenhancing hepatic lesions. The main, right and left portal veins appear patent. Moderate volume recurrent intra-abdominal ascites. High-density material within the gallbladder presumably represent the vicarious excretion of  contrast. Gallbladder wall thickening is nonspecific in the setting of intra-abdominal ascites. No definite intra or extrahepatic biliary ductal dilatation. Pancreas: Normal appearance of the pancreas. Spleen: Normal appearance of the spleen. No evidence of splenomegaly. Adrenals/Urinary Tract: There is symmetric enhancement of the bilateral kidneys. No evidence of nephrolithiasis. No discrete renal lesions or urinary obstruction. Normal appearance of the bilateral adrenal glands. The urinary bladder is underdistended with a Foley catheter. Stomach/Bowel: Sequela of previous distal colonic resection. There is apparent active arterial bleeding from the level of the rectum/anus regional to the level of the enteric anastomosis (image 196, series 5) with contrast extravasation through the anus and onto the CT gantry (representative image 98, series 11). There is diffuse hyperemia of the stomach as well as multiple loops of small bowel, similar though more pronounced compared to abdominal CT performed 03/31/2023, potentially the sequela of portal gastropathy though conceivably shock bowel could have a similar appearance. No evidence of enteric obstruction. No pneumoperitoneum, pneumatosis or portal venous gas. Radiopaque pill fragments are seen within the gastric fundus. No hiatal hernia. Lymphatic: No bulky retroperitoneal, mesenteric, pelvic or inguinal lymphadenopathy. Reproductive: Normal appearance of the pelvic organs. No discrete adnexal lesions. Other: Diffuse body wall anasarca. Musculoskeletal: No acute or aggressive osseous abnormalities. Moderate multilevel lumbar spine DDD, worse at L4-L5 and L5-S1 with disc space height loss, endplate irregularity and sclerosis. IMPRESSION: Vascular Impression: 1. Active arterial bleeding from the level of the rectum/anus regional to the level of the enteric anastomosis with contrast extravasation through the  anus and onto the CT gantry. The area of suspected bleeding  appears to be supplied via a branch from the SMA which runs along side a prominent SMV branch. 2. Scattered atherosclerotic plaque within a normal caliber abdominal aorta, not resulting in a hemodynamically significant narrowing. Aortic Atherosclerosis (ICD10-I70.0). Nonvascular Impression: 1. Cirrhosis with sequela of portal hypertension including moderate volume recurrent intra-abdominal ascites. 2. Diffuse hyperemia of the stomach as well as multiple loops of small bowel, similar though more pronounced compared to abdominal CT performed 03/31/2023, potentially the sequela of portal gastropathy though conceivably shock bowel could have a similar appearance. 3. Small left-sided pleural effusion with associated left basilar atelectasis and associated air bronchograms. Electronically Signed   By: Simonne Come M.D.   On: 04/15/2023 16:59   DG CHEST PORT 1 VIEW  Result Date: 03/24/2023 CLINICAL DATA:  Encounter for central line placement. EXAM: PORTABLE CHEST 1 VIEW COMPARISON:  AP chest 04/02/2023, chest two views 03/31/2023 FINDINGS: New left subclavian central venous catheter with tip overlying the mid to central superior vena cava. Cardiac silhouette and mediastinal contours are within normal limits. Small left pleural effusion appears decreased from 04/02/2023, and more similar to 03/31/2023. Some of these differences may be due to differences in patient positioning. No pneumothorax is seen. Multiple wires overlie the lower chest. Electronic device overlies the midline lower chest. No acute skeletal abnormality. IMPRESSION: 1. New left subclavian central venous catheter with tip overlying the mid to central superior vena cava. No pneumothorax. 2. Small left pleural effusion appears decreased from 04/02/2023, and more similar to 03/31/2023. Electronically Signed   By: Neita Garnet M.D.   On: 04/14/2023 14:50   DG CHEST PORT 1 VIEW  Result Date: 04/02/2023 CLINICAL DATA:  Hypoxia. EXAM: PORTABLE CHEST 1 VIEW  COMPARISON:  03/31/2023. FINDINGS: Cardiac silhouette normal in size.  No mediastinal or hilar masses. Moderate left pleural effusion obscures the hemidiaphragm. Probable small right pleural effusion. Lungs are hyperexpanded with prominent interstitial markings, but otherwise clear. No pneumothorax. Skeletal structures are demineralized, grossly intact. IMPRESSION: 1. Moderate left pleural effusion, increased in size from the prior exam. Small right pleural effusion. 2. No evidence of pneumonia and no convincing pulmonary edema. Electronically Signed   By: Amie Portland M.D.   On: 04/02/2023 11:33   US Paracentesis  Result Date: 03/31/2023 INDICATION: Patient with history of alcohol use disorder, elevated liver function tests, abdominal pain, ascites, fatty liver by imaging, ascites. Request received for diagnostic and therapeutic paracentesis up to 2 liters. EXAM: ULTRASOUND GUIDED DIAGNOSTIC AND THERAPEUTIC PARACENTESIS MEDICATIONS: 8 ml 1% lidocaine COMPLICATIONS: None immediate. PROCEDURE: Informed written consent was obtained from the patient after a discussion of the risks, benefits and alternatives to treatment. A timeout was performed prior to the initiation of the procedure. Initial ultrasound scanning demonstrates a small amount of ascites within the left lower abdominal quadrant. The left lower abdomen was prepped and draped in the usual sterile fashion. 1% lidocaine was used for local anesthesia. Following this, a 19 gauge, 7-cm, Yueh catheter was introduced. An ultrasound image was saved for documentation purposes. The paracentesis was performed. The catheter was removed and a dressing was applied. The patient tolerated the procedure well without immediate post procedural complication. FINDINGS: A total of approximately 1 liter of yellow fluid was removed. Samples were sent to the laboratory as requested by the clinical team. IMPRESSION: Successful ultrasound-guided paracentesis yielding 1 liter of  peritoneal fluid. PLAN: If the patient eventually requires >/=2 paracenteses in a 30  day period, candidacy for formal evaluation by the [facility] will be assessed. Performed by: Artemio Aly Electronically Signed   By: Marliss Coots M.D.   On: 03/31/2023 15:41   DG Chest 2 View  Result Date: 03/31/2023 CLINICAL DATA:  Elevated white blood cell count EXAM: CHEST - 2 VIEW COMPARISON:  X-ray 12/22/2020 FINDINGS: Normal cardiopericardial silhouette. No pneumothorax or consolidation or edema. On lateral view there are small dependent pleural effusions posteriorly. Osteopenia with degenerative changes along the spine and shoulders. Air-fluid level along the stomach beneath the left hemidiaphragm. Overlapping cardiac leads. IMPRESSION: Small bilateral pleural effusions. Electronically Signed   By: Karen Kays M.D.   On: 03/31/2023 13:10   US Abdomen Limited RUQ (LIVER/GB)  Result Date: 03/31/2023 CLINICAL DATA:  Elevated liver function tests. EXAM: ULTRASOUND ABDOMEN LIMITED RIGHT UPPER QUADRANT COMPARISON:  CT abdomen and pelvis 03/31/2023 FINDINGS: The examination was technically limited due to increased respiratory rate and limited patient mobility. Gallbladder: Gallbladder sludge. No shadowing gallstones. Mild gallbladder wall thickening measuring 4 mm. Pericholecystic fluid in the setting of ascites. No sonographic Murphy sign noted by sonographer. Common bile duct: Diameter: 4 mm Liver: Diffusely echogenic and coarsened liver parenchyma without a mass identified. Mildly nodular liver contour. Portal vein is patent on color Doppler imaging with bidirectional flow. Other: Ascites. IMPRESSION: 1. Hepatic steatosis with mildly nodular liver contour which could indicate cirrhosis with bidirectional portal vein flow. 2. Gallbladder sludge and mild gallbladder wall thickening which may be related to liver disease. No gallstones or sonographic Murphy sign to  strongly suggest acute cholecystitis. 3. Ascites. Electronically Signed   By: Sebastian Ache M.D.   On: 03/31/2023 08:35   CT ABDOMEN PELVIS W CONTRAST  Result Date: 03/31/2023 CLINICAL DATA:  Mid to lower abdominal pain for 1 month, worsening in the last few days. Nausea, vomiting and constipation. EXAM: CT ABDOMEN AND PELVIS WITH CONTRAST TECHNIQUE: Multidetector CT imaging of the abdomen and pelvis was performed using the standard protocol following bolus administration of intravenous contrast. RADIATION DOSE REDUCTION: This exam was performed according to the departmental dose-optimization program which includes automated exposure control, adjustment of the mA and/or kV according to patient size and/or use of iterative reconstruction technique. CONTRAST:  75mL OMNIPAQUE IOHEXOL 300 MG/ML  SOLN COMPARISON:  02/26/2019, 03/05/2019. FINDINGS: Lower chest: There is a small left pleural effusion. Bronchial wall thickening is noted bilaterally with atelectasis or scarring at the lung bases. There is a stable 4 mm nodule in the right middle lobe, unchanged from 2020 and likely benign. Hepatobiliary: Hepatic steatosis is noted with focal fatty sparing at the gallbladder fossa and left lobe of the liver. No biliary ductal dilatation. The gallbladder is without stones. Pancreas: Unremarkable. No pancreatic ductal dilatation or surrounding inflammatory changes. Spleen: Normal in size without focal abnormality. Adrenals/Urinary Tract: The adrenal glands are within normal limits. The kidneys enhance symmetrically. No renal calculus or hydronephrosis. The bladder is unremarkable. A stable cystic structure is noted inferior to the urinary bladder, possible urethral diverticulum versus nabothian cyst Stomach/Bowel: There is thickening of the walls of the distal esophagus and stomach with mucosal enhancement. No bowel obstruction, free air, or pneumatosis. No free air or pneumatosis is seen. There is inferior subluxation of  the rectum below the pubococcygeal line compatible with pelvic floor dysfunction. Increased density is noted in the perirectal space bilaterally, greater on the right than on the left. No abscess is seen. There is evidence of partial colectomy with anastomotic  site at the rectosigmoid colon. The appendix is not seen. There is rectal wall thickening involving the ascending colon and cecum. Vascular/Lymphatic: Aortic atherosclerosis. No enlarged abdominal or pelvic lymph nodes. Reproductive: Uterus and bilateral adnexa are unremarkable. Other: There is moderate-to-large ascites.  Anasarca is noted. Musculoskeletal: Degenerative changes are noted in the thoracolumbar spine. No acute osseous abnormality. IMPRESSION: 1. Thickening of the walls of the cecum and ascending colon, possible infectious or inflammatory colitis. 2. Mucosal enhancement with thickening of the walls of the distal esophagus and gastric folds suggesting gastritis/esophagitis. 3. Inferior displacement of the rectum below the pubococcygeal line suggesting pelvic floor dysfunction. There is mild perirectal fat stranding extending into the soft tissues in the perineum, greater on the right than on the left. No abscess is seen. 4. Moderate-to-large ascites and anasarca. 5. Hepatic steatosis. 6. Aortic atherosclerosis. 7. Remaining incidental findings as described above. Electronically Signed   By: Thornell Sartorius M.D.   On: 03/31/2023 04:27    Recent Labs  Lab 03/29/2023 1735 03/25/2023 1825  WBC 2.6*  --   HGB 3.6* 10.3*  HCT 10.9* 31.5*  MCV 88.6  --   PLT PLATELETS APPEAR DECREASED  28*  --    Cardiac Enzymes: No results for input(s): "CKTOTAL", "CKMB", "CKMBINDEX", "TROPONINI" in the last 168 hours. Sepsis Labs: Recent Labs  Lab 04/05/2023 1735  WBC 2.6Leslye Peer 04/14/2023, 2:44 PM

## 2023-04-18 NOTE — Progress Notes (Signed)
RT extubated. per CCM MD

## 2023-04-18 NOTE — Progress Notes (Addendum)
Daily Progress Note  DOA: 04/29/23 Hospital Day: 9  Chief Complaint: Etoh hepatitis  ASSESSMENT & PLAN   Brief Narrative:  Christina Miller is a 57 y.o. year old female with a history of  Etoh abuse,  HTN, diverticular disease s/p sigmoid resection, colon polyps, GERD, COPD and other co-morbidities.  Admitted 9/13 with elevated LFTs, ascites Etoh abuse, hyponatremia and anemia.  Etoh hepatitis with concern for underlying cirrhosis.  Liver chemistries improving on steroids. Encephalopathy resolved. Was possibly combination of Etoh withdrawal, ? Benzos and /or hepatic encephalopathy.  Lactulose stopped 9/19  --On day 6 of steroids. Lille score is 0.23  implying good response to steroids. Continue Prednisolone 40 mg daily .   ---Total Etoh abstinence going forward.  --Eventual HBV vaccination  Lower GI bleed with hemorrhagic shock. Intermittent painless lower GI bleeding since yesterday but developed significant bleeding with am with hypotension. Etiology unclear but maybe diverticular hemorrhage? Rectal varices ? -Rapid Response called. TRH notified -IV Vit K 10 mg   -Stat BMET, CBC -Liter bolus now -Doubt upper source but changed PPI to continuous infusion -CTA abdomen ordered. Unfortunately patient will need new IV placement ( current IV will not suffice). Difficult stick. IV team on way. CTA as soon as possible. No plans for urgent colonoscopy or EGD at this time.  -Patient became progressively hypotensive. TRH here, transferring to ICU now. Dr. Delton Coombes and PCCM awaiting transfer.   Gastroesophageal wall thickening on imaging.  --EGD when medically stable to evaluate CT scan findings and check for varices  Colon wall thickening on CT scan, ? Secondary to  Colopathy. She hasn't been having diarrhea or other symptoms of colitis.    Attending Physician Note   I have taken an interval history, reviewed the chart and examined the patient. I performed a substantive portion  of this encounter, including complete performance of at least one of the key components, in conjunction with the APP. I agree with the APP's note, impression and recommendations with my edits. My additional impressions and recommendations are as follows.   Acute, major LGI bleed with hypotension. IV volume resuscitation, transfusions, transferred to ICU/CCM. R/O diverticular, rectal varices, AVMs, other. CTA and IR consultation. No plan for colonoscopy in setting of active, brisk LGI bleeding.   Alcoholic hepatitis with ascites, hepatic steatosis, possible underlying cirrhosis.  Day 7 of corticosteroids with Lille score 0.23 so continuing prednisolone 40 mg qd for 21 more days. Continue Rocephin. Lactulose stopped.     Claudette Head, MD [facility] See AMION, [facility], for our on call provider     Subjective   No abdominal pain. No N/V. Substantial rectal bleeding noted.    Objective   Pertinent GI workup / intervention thus far:  -1 liter paracentesis negative for SBP. Cytology negative. Unable to calculate SAAG due to low albumin levels -Normal AFP. No liver lesions on CT scan  -Coagulopathy improved with Vit K   Recent Labs    04/06/23 0510 04/06/23 1735 04/17/2023 0434 03/25/2023 1220  WBC 14.6* 12.6* 14.0*  --   HGB 12.2 11.7* 10.7* 8.3*  HCT 36.7 36.9 33.2* 26.6*  PLT 53* 60* 55*  --    BMET Recent Labs    04/06/23 0510 04/12/2023 0434 03/25/2023 1220  NA 129* 128* 132*  K 4.4 4.2 4.6  CL 98 98 103  CO2 24 24 21*  GLUCOSE 100* 82 90  BUN 26* 26* 25*  CREATININE <0.30* 0.31* 0.38*  CALCIUM 8.0* 7.7* 7.2*  LFT Recent Labs    Apr 08, 2023 0434  PROT 4.9*  ALBUMIN 1.9*  AST 75*  ALT 74*  ALKPHOS 169*  BILITOT 5.2*   PT/INR Recent Labs    04/06/23 2103 Apr 08, 2023 0434  LABPROT 16.3* 16.9*  INR 1.3* 1.4*     Scheduled inpatient medications:   sodium chloride   Intravenous Once   Chlorhexidine Gluconate Cloth  6 each Topical Daily   feeding supplement  237 mL  Oral TID BM   folic acid  1 mg Oral Daily   multivitamin with minerals  1 tablet Oral Daily   nicotine  14 mg Transdermal Daily   prednisoLONE  40 mg Oral Daily   thiamine  100 mg Oral Daily   Or   thiamine  100 mg Intravenous Daily   Continuous inpatient infusions:   pantoprazole     phytonadione (VITAMIN K) 10 mg in dextrose 5 % 50 mL IVPB 10 mg (Apr 08, 2023 1227)   PRN inpatient medications: albuterol, ondansetron **OR** ondansetron (ZOFRAN) IV, mouth rinse, oxyCODONE, traZODone  Vital signs in last 24 hours: Temp:  [97.5 F (36.4 C)-97.9 F (36.6 C)] 97.5 F (36.4 C) 04/08/23 1147) Pulse Rate:  [100-128] 128 April 08, 2023 1230) Resp:  [16] 16 04-08-2023 0616) BP: (64-123)/(50-85) 64/50 04-08-23 1230) SpO2:  [95 %-99 %] 99 % 04-08-23 1147) Weight:  [47.6 kg] 47.6 kg 04-08-2023 0500) Last BM Date : 04-08-2023 No intake or output data in the 24 hours ending April 08, 2023 1322  Intake/Output from previous day: No intake/output data recorded. Intake/Output this shift: No intake/output data recorded.   Physical Exam:  General: Alert female in NAD. Sitting in a large amount of bright red blood.  Heart:  Sinus tachycardia  Pulmonary: Normal respiratory effort Abdomen: Soft, nondistended, nontender. Normal bowel sounds. Extremities: Bilateral lower extremity edema.  Neurologic: Alert and oriented Psych: Pleasant. Cooperative.   Principal Problem:   Subacute liver failure Active Problems:   Multiple sclerosis (HCC)   Alcoholic hepatitis with ascites   Esophageal thickening   Abnormal CT scan, colon   Abdominal pain, generalized   Pressure injury of skin   Alcohol abuse   Acute cystitis   Macrocytic anemia   Hyponatremia   Acute hepatic encephalopathy (HCC)   Acute respiratory failure with hypoxia (HCC)   Lower GI bleed     LOS: 7 days   Willette Cluster ,NP 08-Apr-2023, 1:22 PM

## 2023-04-18 NOTE — Progress Notes (Signed)
Patient noted to have large blood clot stool from rectum. GI MD notified. Patient vitals soft 93/70. Patient alert. Consulting civil engineer at bedside. Rapid Response initiated.

## 2023-04-18 NOTE — Procedures (Signed)
Intubation Procedure Note  Christina Miller  [id number]  111/03/1966  Date:04/12/2023  Time:5:57 PM   Provider Performing:Treyson Axel Celine Mans    Procedure: Intubation (31500)  Indication(s) Respiratory Failure  Consent Unable to obtain consent due to emergent nature of procedure.   Anesthesia Versed, Fentanyl, and Ketamine   Time Out Verified patient identification, verified procedure, site/side was marked, verified correct patient position, special equipment/implants available, medications/allergies/relevant history reviewed, required imaging and test results available.   Sterile Technique Usual hand hygeine, masks, and gloves were used   Procedure Description Patient positioned in bed supine.  Sedation given as noted above.  Patient was intubated with endotracheal tube using Glidescope.  View was Grade 3 only epiglottis .  Number of attempts was 1.  Colorimetric CO2 detector was consistent with tracheal placement.   Complications/Tolerance None; patient tolerated the procedure well. Chest X-ray is ordered to verify placement.   EBL Minimal   Specimen(s) None   Rutherford Guys, PA - [facility] For pager details, please see AMION or use Epic chat  After 1900, please call [facility] for cross coverage needs 04/01/2023, 5:57 PM

## 2023-04-18 NOTE — Progress Notes (Signed)
Patient admitted to [facility] ICU from 4th floor (1431) as a rapid response pt. Patient was actively and profusely bleeding from rectum approximately at 1239. Patient alert and oriented x 4, anxious. TRH and GI at beside. TRH consulted PCCM immediately due to patient rapidly decompensating from hemorrhagic shock. However, patient able to maintain consciousness and airway due to initiation of massive transfusion protocol. Despite massive transfusion protocol and fluid administration, patient still profusely bleed from rectum. CTA was ordered to assist in location of bleed but unable to receive STAT due to unstable hemodynamic status. Levophed gtt ordered by PCCM was initiated during massive transfusion protocol and titrated up to . IR was consulted and requested CTA be completed. Once patient was temporarily stabilized with multiple PRBCs, FFP, platelets, vitamin K, initiation of protonix gtt, continuation of levo, fluid boluses, and placing patient on NRB, patient was emergently taken down to CTA. Patient's mentation began to change after CTA but was able to still maintain airway. After CTA was completed, patient was transferred immediately to IR for possible embolization. Massive transfusion continued during IR procedure along with giving 2 amps of sodium bicarb, and increasing levophed above ordered parameter of 40 mcg to 50 mcg. Patient LOC continued to decrease but O2 sats were 98-100% on NRB at 15L. After the IR embolization, patient continued to bleed and was immediately transferred back to the ICU and intubated. 2 Additional amps of Bicarb were given prior to intubation and with 1 amp of bicarb after. Vasopressin was initiated and 4 grams of calcium was given IV.  Patient continued to bleed, CCM consulted CCS to evaluate case.   Family was notified multiple times in regards to patient's status during massive transfusion protocol.  Son and patient's significant other came to beside at approximately 1800.  Patient code status was changed to DNR but continue current interventions moving forward.   Since initiation of massive transfusion protocol: Patient received:  15 units of PRBCs 8 units of FFP 3 units of Plts 3 units of Cryo    At hand off to oncoming shift an additional unit of platelets were infusing not reflected in current total, patient was Report was given to [place] Ouch RN for night shift.

## 2023-04-18 NOTE — Progress Notes (Addendum)
PCCM PROGRESS NOTE   Ongoing massive bleeding, initially 300cc or so, now up to 1300cc in last 45 min and continuing. Bright red per rectum.  She has had 5u PRBC and currently starting 1st of 2 platelets and 1st of 2 FFP.  Massive transfusion protocol initiated. Secretary has paged GI. Will likely need to engage IR emergently as well.   ADDENDUM 1630:  Pt taken for CTA and then straight to IR suite for intervention. Called emergently to IR suite for worsening shock. Pt with ongoing bleeding.  On CTA, Dr. Elby Showers saw hyperemic area near a prior rectal anastamosis site and he is in the process of gel foaming the area. He will then check the pelvis for further bleeds and intervene as necessary.  We administered 2amps HCO3 and plan to intubate the pt upon completion of the case given her change in mental status.  She has received a total of 11 PRBC, 4 FFP, 2 PLT, 1 cryo, 4L NS. She is on 19mcg/min of Levophed.  BP has responded, MAP currently 110. Levophed being weaned. We will order 4g Ca gluconate.  She is awake but altered. She does mumble and moan.  She is sinus tach on monitor with MAPs in the 100 - 110 range. Skin mottled.  RT and RN staff notified of plans for intubation as soon as the IR case is completed.  Additional CC time: 60 min.    Rutherford Guys, PA - [facility] For pager details, please see AMION or use Epic chat  After 1900, please call [facility] for cross coverage needs 04/01/2023, 2:22 PM

## 2023-04-18 NOTE — Progress Notes (Signed)
PROGRESS NOTE    Christina Miller  ZOX:[id number] DOB: 10-17-1965 DOA: 03/24/2023 PCP: Marcine Matar, MD   Brief Narrative:  [name] is a 57 yo female with PMH alcohol abuse, suspected cirrhosis, hepatic steatosis, HTN, GERD, COPD who presented with abdominal fullness, intermittent fevers but no chills. She was having some N/V too. She reported a 12 pack beer daily on admission but also reports liquor use with further questioning too.  She was initially started on antibiotics due to concern for infection as well. Lab workup was consistent with alcoholic hepatitis after admission and she was initiated on steroids.  GI was also consulted on admission. Paracentesis was performed on admission removing 1 L fluid which was negative for SBP on cell count.  GI is following and has her on IV steroids and has not changed her to p.o. prednisone. Lille to be checked tomorrow.  Today she had some bloody stool in her rectum.  GI recommending repeating CBC and if continues to have some considering a CTA and have now stopped her lactulose.  **Patient decompensated today and had profuse brisk Lower GI Bleeding. GI at bedside and patient undergoing Emergent Blood Transfusions and Transferred to ICU and PCCM consulted. PCCM placed Central Access and given that the patient is hypotensive she is undergoing Massive Transfusion Protocol and on Pressors. IR consulted for Mesenteric Angiogram and Embolization and now the patient is status post superior mesenteric angiogram, superior rectal arteriogram, Gelfoam embolization of the distal superior rectal artery near her colonic anastomosis at the site of her hyperemia with no active extravasation and bilateral pelvic angiogram.  Assessment and Plan:  Hemorrhagic Shock in the setting of lower GI bleeding -Patient decompensated and profusely bleeding from the Lower GI tract -Yesterday she was having some intermittent painless lower GI bleeding which was very  scant but now she significantly bled and became hypotensive with unclear etiology -Had a rapid response called and GI was at bedside when I came in and she is status post vitamin K and placed on a Protonix drip -Given a liter bolus and placed on IV PPI -She had a stat CTA was ordered however given her hemodynamic instability she was transferred to the ICU -She is status post massive transfusion protocol and now placed on pressors and interventional radiology is following. -Critical care to assume primary given her decompensation  Subacute Liver Failure --MELD-Na = 32 points as of 9/14 (~65% 90 day mortality) -Continue Vit K 10 mg daily x 3 days per GI rec's and now getting it again  -Continue trending PT/INR and is now 16.9/1.4 -Very sensitive to opoids and benzos causing decreased mentation/lethargy etc, need to be judicious with use; starting to develop some signs of etoh w/d on 9/16 -Ammonia was elevated but now lactulose is being discontinued given below   Alcoholic Hepatitis with Ascites Hyperbilirubinemia - MDF 32 on admission and now up to 88 pts on calculation 9/14 -Continue solu-medrol; waxing/waning PO intake at times; mentation finally more consistently improved; agree with changing to oral steroid 9/18 per GI -LFT Trend and Bilirubin Trend: Recent Labs  Lab 04/01/23 0614 04/02/23 0256 04/03/23 0259 04/04/23 0425 04/05/23 0430 04/06/23 0510 04/10/2023 0434  AST 731* 560* 370* 187* 114* 89* 75*  ALT 144* 156* 144* 117* 96* 85* 74*   Recent Labs  Lab 04/01/23 0614 04/02/23 0256 04/03/23 0259 04/04/23 0425 04/05/23 0430 04/06/23 0510 03/25/2023 0434  BILITOT 13.0* 14.5* 11.8* 7.4* 5.9* 5.9* 5.2*  -High mortality overall in her clinical  PROGRESS NOTE    Christina Miller  ZOX:[id number] DOB: 10-17-1965 DOA: 04/08/2023 PCP: Marcine Matar, MD   Brief Narrative:  [name] is a 57 yo female with PMH alcohol abuse, suspected cirrhosis, hepatic steatosis, HTN, GERD, COPD who presented with abdominal fullness, intermittent fevers but no chills. She was having some N/V too. She reported a 12 pack beer daily on admission but also reports liquor use with further questioning too.  She was initially started on antibiotics due to concern for infection as well. Lab workup was consistent with alcoholic hepatitis after admission and she was initiated on steroids.  GI was also consulted on admission. Paracentesis was performed on admission removing 1 L fluid which was negative for SBP on cell count.  GI is following and has her on IV steroids and has not changed her to p.o. prednisone. Lille to be checked tomorrow.  Today she had some bloody stool in her rectum.  GI recommending repeating CBC and if continues to have some considering a CTA and have now stopped her lactulose.  **Patient decompensated today and had profuse brisk Lower GI Bleeding. GI at bedside and patient undergoing Emergent Blood Transfusions and Transferred to ICU and PCCM consulted. PCCM placed Central Access and given that the patient is hypotensive she is undergoing Massive Transfusion Protocol and on Pressors. IR consulted for Mesenteric Angiogram and Embolization and now the patient is status post superior mesenteric angiogram, superior rectal arteriogram, Gelfoam embolization of the distal superior rectal artery near her colonic anastomosis at the site of her hyperemia with no active extravasation and bilateral pelvic angiogram.  Assessment and Plan:  Hemorrhagic Shock in the setting of lower GI bleeding -Patient decompensated and profusely bleeding from the Lower GI tract -Yesterday she was having some intermittent painless lower GI bleeding which was very  scant but now she significantly bled and became hypotensive with unclear etiology -Had a rapid response called and GI was at bedside when I came in and she is status post vitamin K and placed on a Protonix drip -Given a liter bolus and placed on IV PPI -She had a stat CTA was ordered however given her hemodynamic instability she was transferred to the ICU -She is status post massive transfusion protocol and now placed on pressors and interventional radiology is following. -Critical care to assume primary given her decompensation  Subacute Liver Failure --MELD-Na = 32 points as of 9/14 (~65% 90 day mortality) -Continue Vit K 10 mg daily x 3 days per GI rec's and now getting it again  -Continue trending PT/INR and is now 16.9/1.4 -Very sensitive to opoids and benzos causing decreased mentation/lethargy etc, need to be judicious with use; starting to develop some signs of etoh w/d on 9/16 -Ammonia was elevated but now lactulose is being discontinued given below   Alcoholic Hepatitis with Ascites Hyperbilirubinemia - MDF 32 on admission and now up to 88 pts on calculation 9/14 -Continue solu-medrol; waxing/waning PO intake at times; mentation finally more consistently improved; agree with changing to oral steroid 9/18 per GI -LFT Trend and Bilirubin Trend: Recent Labs  Lab 04/01/23 0614 04/02/23 0256 04/03/23 0259 04/04/23 0425 04/05/23 0430 04/06/23 0510 04/10/2023 0434  AST 731* 560* 370* 187* 114* 89* 75*  ALT 144* 156* 144* 117* 96* 85* 74*   Recent Labs  Lab 04/01/23 0614 04/02/23 0256 04/03/23 0259 04/04/23 0425 04/05/23 0430 04/06/23 0510 03/25/2023 0434  BILITOT 13.0* 14.5* 11.8* 7.4* 5.9* 5.9* 5.2*  -High mortality overall in her clinical  PROGRESS NOTE    Christina Miller  ZOX:[id number] DOB: 10-17-1965 DOA: 04/08/2023 PCP: Marcine Matar, MD   Brief Narrative:  [name] is a 57 yo female with PMH alcohol abuse, suspected cirrhosis, hepatic steatosis, HTN, GERD, COPD who presented with abdominal fullness, intermittent fevers but no chills. She was having some N/V too. She reported a 12 pack beer daily on admission but also reports liquor use with further questioning too.  She was initially started on antibiotics due to concern for infection as well. Lab workup was consistent with alcoholic hepatitis after admission and she was initiated on steroids.  GI was also consulted on admission. Paracentesis was performed on admission removing 1 L fluid which was negative for SBP on cell count.  GI is following and has her on IV steroids and has not changed her to p.o. prednisone. Lille to be checked tomorrow.  Today she had some bloody stool in her rectum.  GI recommending repeating CBC and if continues to have some considering a CTA and have now stopped her lactulose.  **Patient decompensated today and had profuse brisk Lower GI Bleeding. GI at bedside and patient undergoing Emergent Blood Transfusions and Transferred to ICU and PCCM consulted. PCCM placed Central Access and given that the patient is hypotensive she is undergoing Massive Transfusion Protocol and on Pressors. IR consulted for Mesenteric Angiogram and Embolization and now the patient is status post superior mesenteric angiogram, superior rectal arteriogram, Gelfoam embolization of the distal superior rectal artery near her colonic anastomosis at the site of her hyperemia with no active extravasation and bilateral pelvic angiogram.  Assessment and Plan:  Hemorrhagic Shock in the setting of lower GI bleeding -Patient decompensated and profusely bleeding from the Lower GI tract -Yesterday she was having some intermittent painless lower GI bleeding which was very  scant but now she significantly bled and became hypotensive with unclear etiology -Had a rapid response called and GI was at bedside when I came in and she is status post vitamin K and placed on a Protonix drip -Given a liter bolus and placed on IV PPI -She had a stat CTA was ordered however given her hemodynamic instability she was transferred to the ICU -She is status post massive transfusion protocol and now placed on pressors and interventional radiology is following. -Critical care to assume primary given her decompensation  Subacute Liver Failure --MELD-Na = 32 points as of 9/14 (~65% 90 day mortality) -Continue Vit K 10 mg daily x 3 days per GI rec's and now getting it again  -Continue trending PT/INR and is now 16.9/1.4 -Very sensitive to opoids and benzos causing decreased mentation/lethargy etc, need to be judicious with use; starting to develop some signs of etoh w/d on 9/16 -Ammonia was elevated but now lactulose is being discontinued given below   Alcoholic Hepatitis with Ascites Hyperbilirubinemia - MDF 32 on admission and now up to 88 pts on calculation 9/14 -Continue solu-medrol; waxing/waning PO intake at times; mentation finally more consistently improved; agree with changing to oral steroid 9/18 per GI -LFT Trend and Bilirubin Trend: Recent Labs  Lab 04/01/23 0614 04/02/23 0256 04/03/23 0259 04/04/23 0425 04/05/23 0430 04/06/23 0510 04/10/2023 0434  AST 731* 560* 370* 187* 114* 89* 75*  ALT 144* 156* 144* 117* 96* 85* 74*   Recent Labs  Lab 04/01/23 0614 04/02/23 0256 04/03/23 0259 04/04/23 0425 04/05/23 0430 04/06/23 0510 03/25/2023 0434  BILITOT 13.0* 14.5* 11.8* 7.4* 5.9* 5.9* 5.2*  -High mortality overall in her clinical  PROGRESS NOTE    Christina Miller  ZOX:[id number] DOB: 10-17-1965 DOA: 03/24/2023 PCP: Marcine Matar, MD   Brief Narrative:  [name] is a 57 yo female with PMH alcohol abuse, suspected cirrhosis, hepatic steatosis, HTN, GERD, COPD who presented with abdominal fullness, intermittent fevers but no chills. She was having some N/V too. She reported a 12 pack beer daily on admission but also reports liquor use with further questioning too.  She was initially started on antibiotics due to concern for infection as well. Lab workup was consistent with alcoholic hepatitis after admission and she was initiated on steroids.  GI was also consulted on admission. Paracentesis was performed on admission removing 1 L fluid which was negative for SBP on cell count.  GI is following and has her on IV steroids and has not changed her to p.o. prednisone. L[place] to be checked tomorrow.  Today she had some bloody stool in her rectum.  GI recommending repeating CBC and if continues to have some considering a CTA and have now stopped her lactulose.  **Patient decompensated today and had profuse brisk Lower GI Bleeding. GI at bedside and patient undergoing Emergent Blood Transfusions and Transferred to ICU and PCCM consulted. PCCM placed Central Access and given that the patient is hypotensive she is undergoing Massive Transfusion Protocol and on Pressors. IR consulted for Mesenteric Angiogram and Embolization and now the patient is status post superior mesenteric angiogram, superior rectal arteriogram, Gelfoam embolization of the distal superior rectal artery near her colonic anastomosis at the site of her hyperemia with no active extravasation and bilateral pelvic angiogram.  Assessment and Plan:  Hemorrhagic Shock in the setting of lower GI bleeding -Patient decompensated and profusely bleeding from the Lower GI tract -Yesterday she was having some intermittent painless lower GI bleeding which was very  scant but now she significantly bled and became hypotensive with unclear etiology -Had a rapid response called and GI was at bedside when I came in and she is status post vitamin K and placed on a Protonix drip -Given a liter bolus and placed on IV PPI -She had a stat CTA was ordered however given her hemodynamic instability she was transferred to the ICU -She is status post massive transfusion protocol and now placed on pressors and interventional radiology is following. -Critical care to assume primary given her decompensation  Subacute Liver Failure --MELD-Na = 32 points as of 9/14 (~65% 90 day mortality) -Continue Vit K 10 mg daily x 3 days per GI rec's and now getting it again  -Continue trending PT/INR and is now 16.9/1.4 -Very sensitive to opoids and benzos causing decreased mentation/lethargy etc, need to be judicious with use; starting to develop some signs of etoh w/d on 9/16 -Ammonia was elevated but now lactulose is being discontinued given below   Alcoholic Hepatitis with Ascites Hyperbilirubinemia - MDF 32 on admission and now up to 88 pts on calculation 9/14 -Continue solu-medrol; waxing/waning PO intake at times; mentation finally more consistently improved; agree with changing to oral steroid 9/18 per GI -LFT Trend and Bilirubin Trend: Recent Labs  Lab 04/01/23 0614 04/02/23 0256 04/03/23 0259 04/04/23 0425 04/05/23 0430 04/06/23 0510 04/10/2023 0434  AST 731* 560* 370* 187* 114* 89* 75*  ALT 144* 156* 144* 117* 96* 85* 74*   Recent Labs  Lab 04/01/23 0614 04/02/23 0256 04/03/23 0259 04/04/23 0425 04/05/23 0430 04/06/23 0510 03/25/2023 0434  BILITOT 13.0* 14.5* 11.8* 7.4* 5.9* 5.9* 5.2*  -High mortality overall in her clinical  PROGRESS NOTE    Christina Miller  ZOX:[id number] DOB: 10-17-1965 DOA: 04/12/2023 PCP: Marcine Matar, MD   Brief Narrative:  [name] is a 57 yo female with PMH alcohol abuse, suspected cirrhosis, hepatic steatosis, HTN, GERD, COPD who presented with abdominal fullness, intermittent fevers but no chills. She was having some N/V too. She reported a 12 pack beer daily on admission but also reports liquor use with further questioning too.  She was initially started on antibiotics due to concern for infection as well. Lab workup was consistent with alcoholic hepatitis after admission and she was initiated on steroids.  GI was also consulted on admission. Paracentesis was performed on admission removing 1 L fluid which was negative for SBP on cell count.  GI is following and has her on IV steroids and has not changed her to p.o. prednisone. L[place] to be checked tomorrow.  Today she had some bloody stool in her rectum.  GI recommending repeating CBC and if continues to have some considering a CTA and have now stopped her lactulose.  **Patient decompensated today and had profuse brisk Lower GI Bleeding. GI at bedside and patient undergoing Emergent Blood Transfusions and Transferred to ICU and PCCM consulted. PCCM placed Central Access and given that the patient is hypotensive she is undergoing Massive Transfusion Protocol and on Pressors. IR consulted for Mesenteric Angiogram and Embolization and now the patient is status post superior mesenteric angiogram, superior rectal arteriogram, Gelfoam embolization of the distal superior rectal artery near her colonic anastomosis at the site of her hyperemia with no active extravasation and bilateral pelvic angiogram.  Assessment and Plan:  Hemorrhagic Shock in the setting of lower GI bleeding -Patient decompensated and profusely bleeding from the Lower GI tract -Yesterday she was having some intermittent painless lower GI bleeding which was very  scant but now she significantly bled and became hypotensive with unclear etiology -Had a rapid response called and GI was at bedside when I came in and she is status post vitamin K and placed on a Protonix drip -Given a liter bolus and placed on IV PPI -She had a stat CTA was ordered however given her hemodynamic instability she was transferred to the ICU -She is status post massive transfusion protocol and now placed on pressors and interventional radiology is following. -Critical care to assume primary given her decompensation  Subacute Liver Failure --MELD-Na = 32 points as of 9/14 (~65% 90 day mortality) -Continue Vit K 10 mg daily x 3 days per GI rec's and now getting it again  -Continue trending PT/INR and is now 16.9/1.4 -Very sensitive to opoids and benzos causing decreased mentation/lethargy etc, need to be judicious with use; starting to develop some signs of etoh w/d on 9/16 -Ammonia was elevated but now lactulose is being discontinued given below   Alcoholic Hepatitis with Ascites Hyperbilirubinemia - MDF 32 on admission and now up to 88 pts on calculation 9/14 -Continue solu-medrol; waxing/waning PO intake at times; mentation finally more consistently improved; agree with changing to oral steroid 9/18 per GI -LFT Trend and Bilirubin Trend: Recent Labs  Lab 04/01/23 0614 04/02/23 0256 04/03/23 0259 04/04/23 0425 04/05/23 0430 04/06/23 0510 04/10/2023 0434  AST 731* 560* 370* 187* 114* 89* 75*  ALT 144* 156* 144* 117* 96* 85* 74*   Recent Labs  Lab 04/01/23 0614 04/02/23 0256 04/03/23 0259 04/04/23 0425 04/05/23 0430 04/06/23 0510 03/25/2023 0434  BILITOT 13.0* 14.5* 11.8* 7.4* 5.9* 5.9* 5.2*  -High mortality overall in her clinical  PROGRESS NOTE    Christina Miller  ZOX:[id number] DOB: 10-17-1965 DOA: 03/24/2023 PCP: Marcine Matar, MD   Brief Narrative:  [name] is a 57 yo female with PMH alcohol abuse, suspected cirrhosis, hepatic steatosis, HTN, GERD, COPD who presented with abdominal fullness, intermittent fevers but no chills. She was having some N/V too. She reported a 12 pack beer daily on admission but also reports liquor use with further questioning too.  She was initially started on antibiotics due to concern for infection as well. Lab workup was consistent with alcoholic hepatitis after admission and she was initiated on steroids.  GI was also consulted on admission. Paracentesis was performed on admission removing 1 L fluid which was negative for SBP on cell count.  GI is following and has her on IV steroids and has not changed her to p.o. prednisone. L[place] to be checked tomorrow.  Today she had some bloody stool in her rectum.  GI recommending repeating CBC and if continues to have some considering a CTA and have now stopped her lactulose.  **Patient decompensated today and had profuse brisk Lower GI Bleeding. GI at bedside and patient undergoing Emergent Blood Transfusions and Transferred to ICU and PCCM consulted. PCCM placed Central Access and given that the patient is hypotensive she is undergoing Massive Transfusion Protocol and on Pressors. IR consulted for Mesenteric Angiogram and Embolization and now the patient is status post superior mesenteric angiogram, superior rectal arteriogram, Gelfoam embolization of the distal superior rectal artery near her colonic anastomosis at the site of her hyperemia with no active extravasation and bilateral pelvic angiogram.  Assessment and Plan:  Hemorrhagic Shock in the setting of lower GI bleeding -Patient decompensated and profusely bleeding from the Lower GI tract -Yesterday she was having some intermittent painless lower GI bleeding which was very  scant but now she significantly bled and became hypotensive with unclear etiology -Had a rapid response called and GI was at bedside when I came in and she is status post vitamin K and placed on a Protonix drip -Given a liter bolus and placed on IV PPI -She had a stat CTA was ordered however given her hemodynamic instability she was transferred to the ICU -She is status post massive transfusion protocol and now placed on pressors and interventional radiology is following. -Critical care to assume primary given her decompensation  Subacute Liver Failure --MELD-Na = 32 points as of 9/14 (~65% 90 day mortality) -Continue Vit K 10 mg daily x 3 days per GI rec's and now getting it again  -Continue trending PT/INR and is now 16.9/1.4 -Very sensitive to opoids and benzos causing decreased mentation/lethargy etc, need to be judicious with use; starting to develop some signs of etoh w/d on 9/16 -Ammonia was elevated but now lactulose is being discontinued given below   Alcoholic Hepatitis with Ascites Hyperbilirubinemia - MDF 32 on admission and now up to 88 pts on calculation 9/14 -Continue solu-medrol; waxing/waning PO intake at times; mentation finally more consistently improved; agree with changing to oral steroid 9/18 per GI -LFT Trend and Bilirubin Trend: Recent Labs  Lab 04/01/23 0614 04/02/23 0256 04/03/23 0259 04/04/23 0425 04/05/23 0430 04/06/23 0510 04/10/2023 0434  AST 731* 560* 370* 187* 114* 89* 75*  ALT 144* 156* 144* 117* 96* 85* 74*   Recent Labs  Lab 04/01/23 0614 04/02/23 0256 04/03/23 0259 04/04/23 0425 04/05/23 0430 04/06/23 0510 03/25/2023 0434  BILITOT 13.0* 14.5* 11.8* 7.4* 5.9* 5.9* 5.2*  -High mortality overall in her clinical  PROGRESS NOTE    Christina Miller  ZOX:[id number] DOB: 10-17-1965 DOA: 03/24/2023 PCP: Marcine Matar, MD   Brief Narrative:  [name] is a 57 yo female with PMH alcohol abuse, suspected cirrhosis, hepatic steatosis, HTN, GERD, COPD who presented with abdominal fullness, intermittent fevers but no chills. She was having some N/V too. She reported a 12 pack beer daily on admission but also reports liquor use with further questioning too.  She was initially started on antibiotics due to concern for infection as well. Lab workup was consistent with alcoholic hepatitis after admission and she was initiated on steroids.  GI was also consulted on admission. Paracentesis was performed on admission removing 1 L fluid which was negative for SBP on cell count.  GI is following and has her on IV steroids and has not changed her to p.o. prednisone. L[place] to be checked tomorrow.  Today she had some bloody stool in her rectum.  GI recommending repeating CBC and if continues to have some considering a CTA and have now stopped her lactulose.  **Patient decompensated today and had profuse brisk Lower GI Bleeding. GI at bedside and patient undergoing Emergent Blood Transfusions and Transferred to ICU and PCCM consulted. PCCM placed Central Access and given that the patient is hypotensive she is undergoing Massive Transfusion Protocol and on Pressors. IR consulted for Mesenteric Angiogram and Embolization and now the patient is status post superior mesenteric angiogram, superior rectal arteriogram, Gelfoam embolization of the distal superior rectal artery near her colonic anastomosis at the site of her hyperemia with no active extravasation and bilateral pelvic angiogram.  Assessment and Plan:  Hemorrhagic Shock in the setting of lower GI bleeding -Patient decompensated and profusely bleeding from the Lower GI tract -Yesterday she was having some intermittent painless lower GI bleeding which was very  scant but now she significantly bled and became hypotensive with unclear etiology -Had a rapid response called and GI was at bedside when I came in and she is status post vitamin K and placed on a Protonix drip -Given a liter bolus and placed on IV PPI -She had a stat CTA was ordered however given her hemodynamic instability she was transferred to the ICU -She is status post massive transfusion protocol and now placed on pressors and interventional radiology is following. -Critical care to assume primary given her decompensation  Subacute Liver Failure --MELD-Na = 32 points as of 9/14 (~65% 90 day mortality) -Continue Vit K 10 mg daily x 3 days per GI rec's and now getting it again  -Continue trending PT/INR and is now 16.9/1.4 -Very sensitive to opoids and benzos causing decreased mentation/lethargy etc, need to be judicious with use; starting to develop some signs of etoh w/d on 9/16 -Ammonia was elevated but now lactulose is being discontinued given below   Alcoholic Hepatitis with Ascites Hyperbilirubinemia - MDF 32 on admission and now up to 88 pts on calculation 9/14 -Continue solu-medrol; waxing/waning PO intake at times; mentation finally more consistently improved; agree with changing to oral steroid 9/18 per GI -LFT Trend and Bilirubin Trend: Recent Labs  Lab 04/01/23 0614 04/02/23 0256 04/03/23 0259 04/04/23 0425 04/05/23 0430 04/06/23 0510 04/10/2023 0434  AST 731* 560* 370* 187* 114* 89* 75*  ALT 144* 156* 144* 117* 96* 85* 74*   Recent Labs  Lab 04/01/23 0614 04/02/23 0256 04/03/23 0259 04/04/23 0425 04/05/23 0430 04/06/23 0510 03/25/2023 0434  BILITOT 13.0* 14.5* 11.8* 7.4* 5.9* 5.9* 5.2*  -High mortality overall in her clinical  PROGRESS NOTE    Christina Miller  ZOX:[id number] DOB: 10-17-1965 DOA: 04/12/2023 PCP: Marcine Matar, MD   Brief Narrative:  [name] is a 57 yo female with PMH alcohol abuse, suspected cirrhosis, hepatic steatosis, HTN, GERD, COPD who presented with abdominal fullness, intermittent fevers but no chills. She was having some N/V too. She reported a 12 pack beer daily on admission but also reports liquor use with further questioning too.  She was initially started on antibiotics due to concern for infection as well. Lab workup was consistent with alcoholic hepatitis after admission and she was initiated on steroids.  GI was also consulted on admission. Paracentesis was performed on admission removing 1 L fluid which was negative for SBP on cell count.  GI is following and has her on IV steroids and has not changed her to p.o. prednisone. Lille to be checked tomorrow.  Today she had some bloody stool in her rectum.  GI recommending repeating CBC and if continues to have some considering a CTA and have now stopped her lactulose.  **Patient decompensated today and had profuse brisk Lower GI Bleeding. GI at bedside and patient undergoing Emergent Blood Transfusions and Transferred to ICU and PCCM consulted. PCCM placed Central Access and given that the patient is hypotensive she is undergoing Massive Transfusion Protocol and on Pressors. IR consulted for Mesenteric Angiogram and Embolization and now the patient is status post superior mesenteric angiogram, superior rectal arteriogram, Gelfoam embolization of the distal superior rectal artery near her colonic anastomosis at the site of her hyperemia with no active extravasation and bilateral pelvic angiogram.  Assessment and Plan:  Hemorrhagic Shock in the setting of lower GI bleeding -Patient decompensated and profusely bleeding from the Lower GI tract -Yesterday she was having some intermittent painless lower GI bleeding which was very  scant but now she significantly bled and became hypotensive with unclear etiology -Had a rapid response called and GI was at bedside when I came in and she is status post vitamin K and placed on a Protonix drip -Given a liter bolus and placed on IV PPI -She had a stat CTA was ordered however given her hemodynamic instability she was transferred to the ICU -She is status post massive transfusion protocol and now placed on pressors and interventional radiology is following. -Critical care to assume primary given her decompensation  Subacute Liver Failure --MELD-Na = 32 points as of 9/14 (~65% 90 day mortality) -Continue Vit K 10 mg daily x 3 days per GI rec's and now getting it again  -Continue trending PT/INR and is now 16.9/1.4 -Very sensitive to opoids and benzos causing decreased mentation/lethargy etc, need to be judicious with use; starting to develop some signs of etoh w/d on 9/16 -Ammonia was elevated but now lactulose is being discontinued given below   Alcoholic Hepatitis with Ascites Hyperbilirubinemia - MDF 32 on admission and now up to 88 pts on calculation 9/14 -Continue solu-medrol; waxing/waning PO intake at times; mentation finally more consistently improved; agree with changing to oral steroid 9/18 per GI -LFT Trend and Bilirubin Trend: Recent Labs  Lab 04/01/23 0614 04/02/23 0256 04/03/23 0259 04/04/23 0425 04/05/23 0430 04/06/23 0510 04/10/2023 0434  AST 731* 560* 370* 187* 114* 89* 75*  ALT 144* 156* 144* 117* 96* 85* 74*   Recent Labs  Lab 04/01/23 0614 04/02/23 0256 04/03/23 0259 04/04/23 0425 04/05/23 0430 04/06/23 0510 03/25/2023 0434  BILITOT 13.0* 14.5* 11.8* 7.4* 5.9* 5.9* 5.2*  -High mortality overall in her clinical  PROGRESS NOTE    Christina Miller  ZOX:[id number] DOB: 10-17-1965 DOA: 04/12/2023 PCP: Marcine Matar, MD   Brief Narrative:  [name] is a 57 yo female with PMH alcohol abuse, suspected cirrhosis, hepatic steatosis, HTN, GERD, COPD who presented with abdominal fullness, intermittent fevers but no chills. She was having some N/V too. She reported a 12 pack beer daily on admission but also reports liquor use with further questioning too.  She was initially started on antibiotics due to concern for infection as well. Lab workup was consistent with alcoholic hepatitis after admission and she was initiated on steroids.  GI was also consulted on admission. Paracentesis was performed on admission removing 1 L fluid which was negative for SBP on cell count.  GI is following and has her on IV steroids and has not changed her to p.o. prednisone. Lille to be checked tomorrow.  Today she had some bloody stool in her rectum.  GI recommending repeating CBC and if continues to have some considering a CTA and have now stopped her lactulose.  **Patient decompensated today and had profuse brisk Lower GI Bleeding. GI at bedside and patient undergoing Emergent Blood Transfusions and Transferred to ICU and PCCM consulted. PCCM placed Central Access and given that the patient is hypotensive she is undergoing Massive Transfusion Protocol and on Pressors. IR consulted for Mesenteric Angiogram and Embolization and now the patient is status post superior mesenteric angiogram, superior rectal arteriogram, Gelfoam embolization of the distal superior rectal artery near her colonic anastomosis at the site of her hyperemia with no active extravasation and bilateral pelvic angiogram.  Assessment and Plan:  Hemorrhagic Shock in the setting of lower GI bleeding -Patient decompensated and profusely bleeding from the Lower GI tract -Yesterday she was having some intermittent painless lower GI bleeding which was very  scant but now she significantly bled and became hypotensive with unclear etiology -Had a rapid response called and GI was at bedside when I came in and she is status post vitamin K and placed on a Protonix drip -Given a liter bolus and placed on IV PPI -She had a stat CTA was ordered however given her hemodynamic instability she was transferred to the ICU -She is status post massive transfusion protocol and now placed on pressors and interventional radiology is following. -Critical care to assume primary given her decompensation  Subacute Liver Failure --MELD-Na = 32 points as of 9/14 (~65% 90 day mortality) -Continue Vit K 10 mg daily x 3 days per GI rec's and now getting it again  -Continue trending PT/INR and is now 16.9/1.4 -Very sensitive to opoids and benzos causing decreased mentation/lethargy etc, need to be judicious with use; starting to develop some signs of etoh w/d on 9/16 -Ammonia was elevated but now lactulose is being discontinued given below   Alcoholic Hepatitis with Ascites Hyperbilirubinemia - MDF 32 on admission and now up to 88 pts on calculation 9/14 -Continue solu-medrol; waxing/waning PO intake at times; mentation finally more consistently improved; agree with changing to oral steroid 9/18 per GI -LFT Trend and Bilirubin Trend: Recent Labs  Lab 04/01/23 0614 04/02/23 0256 04/03/23 0259 04/04/23 0425 04/05/23 0430 04/06/23 0510 04/10/2023 0434  AST 731* 560* 370* 187* 114* 89* 75*  ALT 144* 156* 144* 117* 96* 85* 74*   Recent Labs  Lab 04/01/23 0614 04/02/23 0256 04/03/23 0259 04/04/23 0425 04/05/23 0430 04/06/23 0510 03/25/2023 0434  BILITOT 13.0* 14.5* 11.8* 7.4* 5.9* 5.9* 5.2*  -High mortality overall in her clinical

## 2023-04-18 NOTE — Progress Notes (Signed)
Critical lab results called to RN hgb 3.6, plt 28. Reports verbally given immediately to Rutherford Guys PA. RN over care informed of results verbally as well.

## 2023-04-18 NOTE — Progress Notes (Signed)
Arrived on shift, received bedside report from [place], [place]. Mass transfusion protocol in process, with a unit of platelets and cryo infusing. Patient noted to be hypotensive, tachycardic, jaundice, and edematous. Patient is unresponsive to stimuli. Patient's family at bedside, noted to be son Christiane Ha and patient's significant other. Patient noted to be mass hemorrhaging from her rectum, but also bleeding from her mouth, and pink hue noted to urine. Patient had already lost approximately 6.5L of blood, with unknown amount unaccounted for. [place] set up for fluid warming and mass transfusion.   1935: Elink contacted for further pressor management. Levophed ceiling rapidly reached, while vaso already maxed at 0.4. Order for epi gtt and increase of vaso and levo ceiling given by MD. Also given order for TXA. Requested octreotide and methylene blue in attempt to better patient's prognosis.   1950: Epi gtt initiated, levo ceiling reached. TXA IV initiated.   2000: TXA completed, methylene blue now infusing. Discussed with Vladimir Faster, MD - will continue MTP protocol for now. Blood bank contacted for blood products. Octreotide started with bolus given. Suction canister filled with blood and replaced for second time.  2030: blood products arrived. X2 units PRBCs spiked onto [place] and started. Patient continuing to rapidly hemorrhage. Collection canister filled and changed for third time. Blood pressure starting to increase, will start titrating down levo and other pressors accordingly.  2045: x2 units of cryo given. Amp of bicarb given per MD. Isla Pence strength levo initiated for short period and then turned off due to patient's SBP of 170s. Patient still receiving rapid transfusions.   2055: unit of platelets given. Patient's blood pressure noted to suddenly begin to downtrend. Titrating pressors accordingly. Patient has filled another canister with blood.  2100: All pressors maxed again. Patient beginning to  develop EKG changes. BP per a-line decreased rapidly to 33/30. Discussed with family at bedside that there is nothing else we are able to do for her. Explained to family that despite best efforts, any continuation of resuscitative measures would not prolong patient's life, or change patient's prognosis. Discussed with family beginning to turn down life-supporting medications, which family is supportive of. Elink MD in agreement with plan.   2105: Patient noted to go into a wide-complex rhythm  2110: Arrhythmia continuing to decompensate. Pressors off at this time, pain medication increased for patient comfort. Family at bedside.   2118: TOD called

## 2023-04-18 DEATH — deceased
